# Patient Record
Sex: Male | Born: 1955
Health system: Southern US, Community
[De-identification: ages and names within clinical notes are randomized; demographics above are authoritative.]

## PROBLEM LIST (undated history)

## (undated) DIAGNOSIS — I639 Cerebral infarction, unspecified: Secondary | ICD-10-CM

## (undated) DIAGNOSIS — B1920 Unspecified viral hepatitis C without hepatic coma: Secondary | ICD-10-CM

## (undated) DIAGNOSIS — K219 Gastro-esophageal reflux disease without esophagitis: Secondary | ICD-10-CM

## (undated) DIAGNOSIS — R519 Headache, unspecified: Secondary | ICD-10-CM

## (undated) DIAGNOSIS — C3490 Malignant neoplasm of unspecified part of unspecified bronchus or lung: Secondary | ICD-10-CM

## (undated) DIAGNOSIS — G43909 Migraine, unspecified, not intractable, without status migrainosus: Secondary | ICD-10-CM

## (undated) DIAGNOSIS — R51 Headache: Secondary | ICD-10-CM

## (undated) DIAGNOSIS — I739 Peripheral vascular disease, unspecified: Secondary | ICD-10-CM

## (undated) DIAGNOSIS — Z9289 Personal history of other medical treatment: Secondary | ICD-10-CM

## (undated) DIAGNOSIS — N200 Calculus of kidney: Secondary | ICD-10-CM

## (undated) DIAGNOSIS — Z85118 Personal history of other malignant neoplasm of bronchus and lung: Secondary | ICD-10-CM

## (undated) DIAGNOSIS — F321 Major depressive disorder, single episode, moderate: Secondary | ICD-10-CM

## (undated) DIAGNOSIS — M545 Low back pain: Secondary | ICD-10-CM

## (undated) DIAGNOSIS — Z86010 Personal history of colonic polyps: Secondary | ICD-10-CM

## (undated) DIAGNOSIS — I2510 Atherosclerotic heart disease of native coronary artery without angina pectoris: Secondary | ICD-10-CM

## (undated) DIAGNOSIS — G4700 Insomnia, unspecified: Secondary | ICD-10-CM

## (undated) DIAGNOSIS — R32 Unspecified urinary incontinence: Secondary | ICD-10-CM

## (undated) DIAGNOSIS — J449 Chronic obstructive pulmonary disease, unspecified: Secondary | ICD-10-CM

## (undated) DIAGNOSIS — J441 Chronic obstructive pulmonary disease with (acute) exacerbation: Secondary | ICD-10-CM

## (undated) DIAGNOSIS — M542 Cervicalgia: Secondary | ICD-10-CM

## (undated) DIAGNOSIS — M79661 Pain in right lower leg: Secondary | ICD-10-CM

## (undated) DIAGNOSIS — I10 Essential (primary) hypertension: Secondary | ICD-10-CM

## (undated) HISTORY — DX: Cerebral infarction, unspecified: I63.9

## (undated) HISTORY — DX: Unspecified viral hepatitis C without hepatic coma: B19.20

## (undated) HISTORY — DX: Peripheral vascular disease, unspecified: I73.9

## (undated) HISTORY — DX: Essential (primary) hypertension: I10

## (undated) HISTORY — DX: Personal history of other medical treatment: Z92.89

## (undated) HISTORY — DX: Atherosclerotic heart disease of native coronary artery without angina pectoris: I25.10

---

## 1978-05-29 HISTORY — PX: OTHER SURGICAL HISTORY: SHX169

## 1979-05-30 DIAGNOSIS — Z9289 Personal history of other medical treatment: Secondary | ICD-10-CM

## 1979-05-30 HISTORY — DX: Personal history of other medical treatment: Z92.89

## 1998-10-01 ENCOUNTER — Encounter: Payer: Self-pay | Admitting: Emergency Medicine

## 1998-10-01 ENCOUNTER — Emergency Department (HOSPITAL_COMMUNITY): Admission: EM | Admit: 1998-10-01 | Discharge: 1998-10-01 | Payer: Self-pay | Admitting: Emergency Medicine

## 2000-01-05 ENCOUNTER — Emergency Department (HOSPITAL_COMMUNITY): Admission: EM | Admit: 2000-01-05 | Discharge: 2000-01-05 | Payer: Self-pay | Admitting: Emergency Medicine

## 2000-03-29 ENCOUNTER — Encounter: Payer: Self-pay | Admitting: Emergency Medicine

## 2000-03-29 ENCOUNTER — Emergency Department (HOSPITAL_COMMUNITY): Admission: EM | Admit: 2000-03-29 | Discharge: 2000-03-29 | Payer: Self-pay | Admitting: Emergency Medicine

## 2000-03-30 ENCOUNTER — Observation Stay (HOSPITAL_COMMUNITY): Admission: RE | Admit: 2000-03-30 | Discharge: 2000-03-31 | Payer: Self-pay | Admitting: *Deleted

## 2001-03-05 ENCOUNTER — Encounter: Payer: Self-pay | Admitting: Emergency Medicine

## 2001-03-05 ENCOUNTER — Emergency Department (HOSPITAL_COMMUNITY): Admission: EM | Admit: 2001-03-05 | Discharge: 2001-03-05 | Payer: Self-pay | Admitting: Emergency Medicine

## 2001-03-07 ENCOUNTER — Emergency Department (HOSPITAL_COMMUNITY): Admission: EM | Admit: 2001-03-07 | Discharge: 2001-03-07 | Payer: Self-pay | Admitting: Emergency Medicine

## 2001-03-14 ENCOUNTER — Emergency Department (HOSPITAL_COMMUNITY): Admission: EM | Admit: 2001-03-14 | Discharge: 2001-03-14 | Payer: Self-pay | Admitting: Emergency Medicine

## 2001-04-05 ENCOUNTER — Encounter: Payer: Self-pay | Admitting: Emergency Medicine

## 2001-04-05 ENCOUNTER — Emergency Department (HOSPITAL_COMMUNITY): Admission: EM | Admit: 2001-04-05 | Discharge: 2001-04-06 | Payer: Self-pay | Admitting: Emergency Medicine

## 2004-07-04 ENCOUNTER — Ambulatory Visit: Payer: Self-pay | Admitting: Family Medicine

## 2004-07-26 ENCOUNTER — Encounter: Admission: RE | Admit: 2004-07-26 | Discharge: 2004-07-26 | Payer: Self-pay | Admitting: Surgery

## 2004-07-28 ENCOUNTER — Ambulatory Visit (HOSPITAL_BASED_OUTPATIENT_CLINIC_OR_DEPARTMENT_OTHER): Admission: RE | Admit: 2004-07-28 | Discharge: 2004-07-28 | Payer: Self-pay | Admitting: Surgery

## 2004-07-28 ENCOUNTER — Ambulatory Visit (HOSPITAL_COMMUNITY): Admission: RE | Admit: 2004-07-28 | Discharge: 2004-07-28 | Payer: Self-pay | Admitting: Surgery

## 2004-07-29 ENCOUNTER — Ambulatory Visit: Payer: Self-pay | Admitting: Internal Medicine

## 2004-08-05 ENCOUNTER — Emergency Department (HOSPITAL_COMMUNITY): Admission: EM | Admit: 2004-08-05 | Discharge: 2004-08-06 | Payer: Self-pay | Admitting: Emergency Medicine

## 2004-08-11 ENCOUNTER — Ambulatory Visit: Payer: Self-pay | Admitting: Internal Medicine

## 2004-08-15 ENCOUNTER — Encounter (INDEPENDENT_AMBULATORY_CARE_PROVIDER_SITE_OTHER): Payer: Self-pay | Admitting: *Deleted

## 2004-08-15 ENCOUNTER — Ambulatory Visit: Admission: RE | Admit: 2004-08-15 | Discharge: 2004-08-15 | Payer: Self-pay | Admitting: Internal Medicine

## 2004-08-15 ENCOUNTER — Ambulatory Visit: Payer: Self-pay | Admitting: Internal Medicine

## 2004-08-23 ENCOUNTER — Ambulatory Visit: Payer: Self-pay | Admitting: Internal Medicine

## 2004-08-25 ENCOUNTER — Ambulatory Visit: Payer: Self-pay | Admitting: *Deleted

## 2004-08-26 ENCOUNTER — Encounter: Admission: RE | Admit: 2004-08-26 | Discharge: 2004-08-26 | Payer: Self-pay | Admitting: Internal Medicine

## 2004-08-27 ENCOUNTER — Ambulatory Visit (HOSPITAL_COMMUNITY): Admission: RE | Admit: 2004-08-27 | Discharge: 2004-08-27 | Payer: Self-pay | Admitting: Internal Medicine

## 2004-09-05 ENCOUNTER — Ambulatory Visit: Admission: RE | Admit: 2004-09-05 | Discharge: 2004-11-28 | Payer: Self-pay | Admitting: *Deleted

## 2004-09-06 ENCOUNTER — Ambulatory Visit: Payer: Self-pay | Admitting: Internal Medicine

## 2004-09-16 ENCOUNTER — Ambulatory Visit: Payer: Self-pay | Admitting: Family Medicine

## 2004-10-06 ENCOUNTER — Ambulatory Visit (HOSPITAL_COMMUNITY): Admission: RE | Admit: 2004-10-06 | Discharge: 2004-10-06 | Payer: Self-pay | Admitting: Internal Medicine

## 2004-10-10 ENCOUNTER — Ambulatory Visit: Payer: Self-pay | Admitting: Internal Medicine

## 2004-11-18 ENCOUNTER — Ambulatory Visit: Payer: Self-pay | Admitting: Family Medicine

## 2004-11-23 ENCOUNTER — Ambulatory Visit (HOSPITAL_COMMUNITY): Admission: RE | Admit: 2004-11-23 | Discharge: 2004-11-23 | Payer: Self-pay | Admitting: Internal Medicine

## 2004-11-25 ENCOUNTER — Ambulatory Visit: Payer: Self-pay | Admitting: Internal Medicine

## 2004-12-20 ENCOUNTER — Ambulatory Visit: Admission: RE | Admit: 2004-12-20 | Discharge: 2004-12-20 | Payer: Self-pay | Admitting: *Deleted

## 2005-01-10 ENCOUNTER — Ambulatory Visit: Payer: Self-pay | Admitting: Family Medicine

## 2005-01-13 ENCOUNTER — Ambulatory Visit: Payer: Self-pay | Admitting: Internal Medicine

## 2005-01-18 ENCOUNTER — Ambulatory Visit (HOSPITAL_COMMUNITY): Admission: RE | Admit: 2005-01-18 | Discharge: 2005-01-18 | Payer: Self-pay | Admitting: Internal Medicine

## 2005-01-24 ENCOUNTER — Ambulatory Visit: Admission: RE | Admit: 2005-01-24 | Discharge: 2005-03-03 | Payer: Self-pay | Admitting: Internal Medicine

## 2005-01-26 ENCOUNTER — Ambulatory Visit: Payer: Self-pay | Admitting: Family Medicine

## 2005-01-27 ENCOUNTER — Ambulatory Visit: Payer: Self-pay | Admitting: Family Medicine

## 2005-01-30 ENCOUNTER — Emergency Department (HOSPITAL_COMMUNITY): Admission: EM | Admit: 2005-01-30 | Discharge: 2005-01-30 | Payer: Self-pay | Admitting: Emergency Medicine

## 2005-02-16 ENCOUNTER — Ambulatory Visit (HOSPITAL_COMMUNITY): Admission: RE | Admit: 2005-02-16 | Discharge: 2005-02-16 | Payer: Self-pay | Admitting: *Deleted

## 2005-02-28 ENCOUNTER — Ambulatory Visit: Payer: Self-pay | Admitting: Sports Medicine

## 2005-03-14 ENCOUNTER — Ambulatory Visit: Payer: Self-pay | Admitting: Internal Medicine

## 2005-03-14 ENCOUNTER — Ambulatory Visit (HOSPITAL_COMMUNITY): Admission: RE | Admit: 2005-03-14 | Discharge: 2005-03-14 | Payer: Self-pay | Admitting: Internal Medicine

## 2005-03-21 ENCOUNTER — Ambulatory Visit (HOSPITAL_COMMUNITY): Admission: RE | Admit: 2005-03-21 | Discharge: 2005-03-21 | Payer: Self-pay | Admitting: Internal Medicine

## 2005-04-17 ENCOUNTER — Ambulatory Visit: Payer: Self-pay | Admitting: Family Medicine

## 2005-04-19 ENCOUNTER — Ambulatory Visit (HOSPITAL_COMMUNITY): Admission: RE | Admit: 2005-04-19 | Discharge: 2005-04-19 | Payer: Self-pay | Admitting: Internal Medicine

## 2005-05-10 ENCOUNTER — Emergency Department (HOSPITAL_COMMUNITY): Admission: EM | Admit: 2005-05-10 | Discharge: 2005-05-10 | Payer: Self-pay | Admitting: Emergency Medicine

## 2005-05-15 ENCOUNTER — Emergency Department (HOSPITAL_COMMUNITY): Admission: EM | Admit: 2005-05-15 | Discharge: 2005-05-15 | Payer: Self-pay | Admitting: Emergency Medicine

## 2005-05-23 ENCOUNTER — Emergency Department (HOSPITAL_COMMUNITY): Admission: EM | Admit: 2005-05-23 | Discharge: 2005-05-23 | Payer: Self-pay | Admitting: Emergency Medicine

## 2005-05-24 ENCOUNTER — Ambulatory Visit: Payer: Self-pay | Admitting: Internal Medicine

## 2005-05-25 ENCOUNTER — Ambulatory Visit (HOSPITAL_COMMUNITY): Admission: RE | Admit: 2005-05-25 | Discharge: 2005-05-25 | Payer: Self-pay | Admitting: Internal Medicine

## 2005-07-14 ENCOUNTER — Ambulatory Visit: Payer: Self-pay | Admitting: Internal Medicine

## 2005-07-18 ENCOUNTER — Ambulatory Visit (HOSPITAL_COMMUNITY): Admission: RE | Admit: 2005-07-18 | Discharge: 2005-07-18 | Payer: Self-pay | Admitting: Internal Medicine

## 2005-07-25 ENCOUNTER — Ambulatory Visit (HOSPITAL_COMMUNITY): Admission: RE | Admit: 2005-07-25 | Discharge: 2005-07-25 | Payer: Self-pay | Admitting: Internal Medicine

## 2005-07-28 ENCOUNTER — Ambulatory Visit: Payer: Self-pay | Admitting: Family Medicine

## 2005-08-28 ENCOUNTER — Ambulatory Visit: Payer: Self-pay | Admitting: Family Medicine

## 2005-08-29 ENCOUNTER — Ambulatory Visit (HOSPITAL_COMMUNITY): Admission: RE | Admit: 2005-08-29 | Discharge: 2005-08-29 | Payer: Self-pay | Admitting: Family Medicine

## 2005-09-18 ENCOUNTER — Ambulatory Visit: Payer: Self-pay | Admitting: Family Medicine

## 2005-10-10 ENCOUNTER — Ambulatory Visit (HOSPITAL_COMMUNITY): Admission: RE | Admit: 2005-10-10 | Discharge: 2005-10-10 | Payer: Self-pay | Admitting: Internal Medicine

## 2005-10-13 ENCOUNTER — Ambulatory Visit: Payer: Self-pay | Admitting: Internal Medicine

## 2005-10-17 LAB — CBC WITH DIFFERENTIAL/PLATELET
BASO%: 0.6 % (ref 0.0–2.0)
Basophils Absolute: 0 10*3/uL (ref 0.0–0.1)
HCT: 41.2 % (ref 38.7–49.9)
HGB: 14.3 g/dL (ref 13.0–17.1)
MCHC: 34.6 g/dL (ref 32.0–35.9)
MONO#: 0.4 10*3/uL (ref 0.1–0.9)
NEUT%: 50.8 % (ref 40.0–75.0)
WBC: 3.9 10*3/uL — ABNORMAL LOW (ref 4.0–10.0)
lymph#: 1.4 10*3/uL (ref 0.9–3.3)

## 2005-10-17 LAB — COMPREHENSIVE METABOLIC PANEL
ALT: 32 U/L (ref 0–40)
Albumin: 4.2 g/dL (ref 3.5–5.2)
CO2: 27 mEq/L (ref 19–32)
Calcium: 9.4 mg/dL (ref 8.4–10.5)
Chloride: 103 mEq/L (ref 96–112)
Creatinine, Ser: 1 mg/dL (ref 0.4–1.5)

## 2005-10-26 ENCOUNTER — Encounter (INDEPENDENT_AMBULATORY_CARE_PROVIDER_SITE_OTHER): Payer: Self-pay | Admitting: Specialist

## 2005-10-26 ENCOUNTER — Ambulatory Visit (HOSPITAL_COMMUNITY): Admission: RE | Admit: 2005-10-26 | Discharge: 2005-10-26 | Payer: Self-pay | Admitting: Urology

## 2005-11-20 ENCOUNTER — Ambulatory Visit: Payer: Self-pay | Admitting: Sports Medicine

## 2005-12-25 ENCOUNTER — Ambulatory Visit: Payer: Self-pay | Admitting: Family Medicine

## 2006-01-31 ENCOUNTER — Ambulatory Visit: Payer: Self-pay | Admitting: Family Medicine

## 2006-02-02 ENCOUNTER — Ambulatory Visit: Payer: Self-pay | Admitting: Internal Medicine

## 2006-02-05 ENCOUNTER — Ambulatory Visit: Payer: Self-pay | Admitting: Sports Medicine

## 2006-02-06 ENCOUNTER — Ambulatory Visit (HOSPITAL_COMMUNITY): Admission: RE | Admit: 2006-02-06 | Discharge: 2006-02-06 | Payer: Self-pay | Admitting: Internal Medicine

## 2006-02-13 LAB — CBC WITH DIFFERENTIAL/PLATELET
Basophils Absolute: 0 10*3/uL (ref 0.0–0.1)
HCT: 41.8 % (ref 38.7–49.9)
HGB: 14.6 g/dL (ref 13.0–17.1)
MCH: 33.9 pg — ABNORMAL HIGH (ref 28.0–33.4)
MONO#: 0.4 10*3/uL (ref 0.1–0.9)
NEUT%: 52.8 % (ref 40.0–75.0)
WBC: 4.3 10*3/uL (ref 4.0–10.0)
lymph#: 1.5 10*3/uL (ref 0.9–3.3)

## 2006-02-13 LAB — COMPREHENSIVE METABOLIC PANEL
BUN: 13 mg/dL (ref 6–23)
CO2: 26 mEq/L (ref 19–32)
Calcium: 9.8 mg/dL (ref 8.4–10.5)
Chloride: 101 mEq/L (ref 96–112)
Creatinine, Ser: 0.88 mg/dL (ref 0.40–1.50)
Glucose, Bld: 106 mg/dL — ABNORMAL HIGH (ref 70–99)

## 2006-03-12 ENCOUNTER — Ambulatory Visit: Payer: Self-pay | Admitting: Family Medicine

## 2006-03-22 ENCOUNTER — Emergency Department (HOSPITAL_COMMUNITY): Admission: EM | Admit: 2006-03-22 | Discharge: 2006-03-22 | Payer: Self-pay | Admitting: Emergency Medicine

## 2006-03-26 ENCOUNTER — Ambulatory Visit: Payer: Self-pay | Admitting: Family Medicine

## 2006-05-07 ENCOUNTER — Ambulatory Visit: Payer: Self-pay | Admitting: Family Medicine

## 2006-05-29 HISTORY — PX: HIATAL HERNIA REPAIR: SHX195

## 2006-06-05 ENCOUNTER — Ambulatory Visit (HOSPITAL_COMMUNITY): Admission: RE | Admit: 2006-06-05 | Discharge: 2006-06-05 | Payer: Self-pay | Admitting: Internal Medicine

## 2006-06-07 ENCOUNTER — Ambulatory Visit: Payer: Self-pay | Admitting: Internal Medicine

## 2006-06-08 ENCOUNTER — Ambulatory Visit: Payer: Self-pay | Admitting: Family Medicine

## 2006-06-12 LAB — CBC WITH DIFFERENTIAL/PLATELET
BASO%: 0.3 % (ref 0.0–2.0)
HCT: 41.9 % (ref 38.7–49.9)
MCHC: 34.8 g/dL (ref 32.0–35.9)
MONO#: 0.4 10*3/uL (ref 0.1–0.9)
NEUT%: 54.7 % (ref 40.0–75.0)
RBC: 4.26 10*6/uL (ref 4.20–5.71)
RDW: 12.2 % (ref 11.2–14.6)
WBC: 4.2 10*3/uL (ref 4.0–10.0)
lymph#: 1.4 10*3/uL (ref 0.9–3.3)

## 2006-06-12 LAB — COMPREHENSIVE METABOLIC PANEL
AST: 30 U/L (ref 0–37)
Albumin: 4.4 g/dL (ref 3.5–5.2)
BUN: 14 mg/dL (ref 6–23)
Calcium: 9.6 mg/dL (ref 8.4–10.5)
Chloride: 102 mEq/L (ref 96–112)
Glucose, Bld: 120 mg/dL — ABNORMAL HIGH (ref 70–99)
Potassium: 4.3 mEq/L (ref 3.5–5.3)

## 2006-07-09 ENCOUNTER — Ambulatory Visit (HOSPITAL_BASED_OUTPATIENT_CLINIC_OR_DEPARTMENT_OTHER): Admission: RE | Admit: 2006-07-09 | Discharge: 2006-07-09 | Payer: Self-pay | Admitting: Urology

## 2006-07-09 ENCOUNTER — Encounter (INDEPENDENT_AMBULATORY_CARE_PROVIDER_SITE_OTHER): Payer: Self-pay | Admitting: *Deleted

## 2006-07-26 DIAGNOSIS — R519 Headache, unspecified: Secondary | ICD-10-CM | POA: Insufficient documentation

## 2006-07-26 DIAGNOSIS — F5232 Male orgasmic disorder: Secondary | ICD-10-CM

## 2006-07-26 DIAGNOSIS — G4700 Insomnia, unspecified: Secondary | ICD-10-CM | POA: Insufficient documentation

## 2006-07-26 DIAGNOSIS — R51 Headache: Secondary | ICD-10-CM

## 2006-07-26 DIAGNOSIS — F411 Generalized anxiety disorder: Secondary | ICD-10-CM | POA: Insufficient documentation

## 2006-07-26 DIAGNOSIS — C3490 Malignant neoplasm of unspecified part of unspecified bronchus or lung: Secondary | ICD-10-CM

## 2006-07-26 HISTORY — DX: Insomnia, unspecified: G47.00

## 2006-08-06 ENCOUNTER — Telehealth: Payer: Self-pay | Admitting: Family Medicine

## 2006-08-13 ENCOUNTER — Ambulatory Visit: Payer: Self-pay | Admitting: Family Medicine

## 2006-08-13 DIAGNOSIS — J449 Chronic obstructive pulmonary disease, unspecified: Secondary | ICD-10-CM

## 2006-08-13 HISTORY — DX: Chronic obstructive pulmonary disease, unspecified: J44.9

## 2006-08-15 ENCOUNTER — Telehealth: Payer: Self-pay | Admitting: Family Medicine

## 2006-08-16 ENCOUNTER — Emergency Department (HOSPITAL_COMMUNITY): Admission: EM | Admit: 2006-08-16 | Discharge: 2006-08-17 | Payer: Self-pay | Admitting: Emergency Medicine

## 2006-09-14 ENCOUNTER — Ambulatory Visit: Payer: Self-pay | Admitting: Family Medicine

## 2006-09-14 ENCOUNTER — Encounter: Admission: RE | Admit: 2006-09-14 | Discharge: 2006-09-14 | Payer: Self-pay | Admitting: Sports Medicine

## 2006-09-19 ENCOUNTER — Telehealth: Payer: Self-pay | Admitting: Family Medicine

## 2006-09-25 ENCOUNTER — Telehealth: Payer: Self-pay | Admitting: Family Medicine

## 2006-10-17 ENCOUNTER — Telehealth: Payer: Self-pay | Admitting: Family Medicine

## 2006-10-26 ENCOUNTER — Telehealth: Payer: Self-pay | Admitting: *Deleted

## 2006-10-27 ENCOUNTER — Emergency Department (HOSPITAL_COMMUNITY): Admission: EM | Admit: 2006-10-27 | Discharge: 2006-10-27 | Payer: Self-pay | Admitting: Emergency Medicine

## 2006-10-29 ENCOUNTER — Ambulatory Visit: Payer: Self-pay | Admitting: Sports Medicine

## 2006-11-06 ENCOUNTER — Telehealth: Payer: Self-pay | Admitting: *Deleted

## 2006-11-07 ENCOUNTER — Ambulatory Visit: Payer: Self-pay | Admitting: Sports Medicine

## 2006-11-26 ENCOUNTER — Telehealth (INDEPENDENT_AMBULATORY_CARE_PROVIDER_SITE_OTHER): Payer: Self-pay | Admitting: *Deleted

## 2006-12-03 ENCOUNTER — Ambulatory Visit: Payer: Self-pay | Admitting: Internal Medicine

## 2006-12-04 LAB — CBC WITH DIFFERENTIAL/PLATELET
BASO%: 0.3 % (ref 0.0–2.0)
Eosinophils Absolute: 0 10*3/uL (ref 0.0–0.5)
HGB: 14.4 g/dL (ref 13.0–17.1)
LYMPH%: 32.1 % (ref 14.0–48.0)
MCH: 34.7 pg — ABNORMAL HIGH (ref 28.0–33.4)
MCHC: 35.5 g/dL (ref 32.0–35.9)
MONO#: 0.5 10*3/uL (ref 0.1–0.9)
MONO%: 11 % (ref 0.0–13.0)
NEUT#: 2.6 10*3/uL (ref 1.5–6.5)
NEUT%: 55.9 % (ref 40.0–75.0)

## 2006-12-04 LAB — COMPREHENSIVE METABOLIC PANEL
Alkaline Phosphatase: 80 U/L (ref 39–117)
CO2: 28 mEq/L (ref 19–32)
Creatinine, Ser: 0.99 mg/dL (ref 0.40–1.50)
Glucose, Bld: 87 mg/dL (ref 70–99)
Total Bilirubin: 0.5 mg/dL (ref 0.3–1.2)

## 2006-12-06 ENCOUNTER — Ambulatory Visit (HOSPITAL_COMMUNITY): Admission: RE | Admit: 2006-12-06 | Discharge: 2006-12-06 | Payer: Self-pay | Admitting: Internal Medicine

## 2006-12-24 ENCOUNTER — Ambulatory Visit: Payer: Self-pay | Admitting: Family Medicine

## 2006-12-24 DIAGNOSIS — F3289 Other specified depressive episodes: Secondary | ICD-10-CM | POA: Insufficient documentation

## 2006-12-24 DIAGNOSIS — F329 Major depressive disorder, single episode, unspecified: Secondary | ICD-10-CM | POA: Insufficient documentation

## 2007-01-29 ENCOUNTER — Telehealth: Payer: Self-pay | Admitting: *Deleted

## 2007-02-11 ENCOUNTER — Telehealth: Payer: Self-pay | Admitting: *Deleted

## 2007-02-15 ENCOUNTER — Ambulatory Visit: Payer: Self-pay | Admitting: Family Medicine

## 2007-02-18 ENCOUNTER — Telehealth (INDEPENDENT_AMBULATORY_CARE_PROVIDER_SITE_OTHER): Payer: Self-pay | Admitting: *Deleted

## 2007-02-18 ENCOUNTER — Telehealth: Payer: Self-pay | Admitting: Family Medicine

## 2007-02-18 LAB — CONVERTED CEMR LAB
ALT: 106 units/L — ABNORMAL HIGH (ref 0–53)
AST: 59 units/L — ABNORMAL HIGH (ref 0–37)
Albumin: 3.9 g/dL (ref 3.5–5.2)
Alkaline Phosphatase: 95 units/L (ref 39–117)
Glucose, Bld: 74 mg/dL (ref 70–99)
MCHC: 34.3 g/dL (ref 30.0–36.0)
MCV: 96.2 fL (ref 78.0–100.0)
Platelets: 193 10*3/uL (ref 150–400)
Potassium: 4.4 meq/L (ref 3.5–5.3)
RDW: 12.3 % (ref 11.5–14.0)
Sodium: 140 meq/L (ref 135–145)
Total Bilirubin: 0.5 mg/dL (ref 0.3–1.2)
Total Protein: 7.1 g/dL (ref 6.0–8.3)

## 2007-02-19 ENCOUNTER — Telehealth: Payer: Self-pay | Admitting: Family Medicine

## 2007-02-19 ENCOUNTER — Ambulatory Visit (HOSPITAL_COMMUNITY): Admission: RE | Admit: 2007-02-19 | Discharge: 2007-02-19 | Payer: Self-pay | Admitting: Family Medicine

## 2007-02-25 ENCOUNTER — Encounter: Payer: Self-pay | Admitting: *Deleted

## 2007-03-08 ENCOUNTER — Telehealth: Payer: Self-pay | Admitting: Family Medicine

## 2007-03-11 ENCOUNTER — Ambulatory Visit: Payer: Self-pay | Admitting: Family Medicine

## 2007-03-25 ENCOUNTER — Ambulatory Visit: Payer: Self-pay | Admitting: Family Medicine

## 2007-03-26 ENCOUNTER — Encounter (INDEPENDENT_AMBULATORY_CARE_PROVIDER_SITE_OTHER): Payer: Self-pay | Admitting: *Deleted

## 2007-03-26 LAB — CONVERTED CEMR LAB
ALT: 102 units/L — ABNORMAL HIGH (ref 0–53)
Alkaline Phosphatase: 72 units/L (ref 39–117)
CO2: 26 meq/L (ref 19–32)
Creatinine, Ser: 0.83 mg/dL (ref 0.40–1.50)
HCT: 42.1 % (ref 39.0–52.0)
MCHC: 34.7 g/dL (ref 30.0–36.0)
MCV: 94.2 fL (ref 78.0–100.0)
Platelets: 233 10*3/uL (ref 150–400)
Total Bilirubin: 0.7 mg/dL (ref 0.3–1.2)
WBC: 5.2 10*3/uL (ref 4.0–10.5)

## 2007-03-29 ENCOUNTER — Encounter: Admission: RE | Admit: 2007-03-29 | Discharge: 2007-03-29 | Payer: Self-pay | Admitting: Family Medicine

## 2007-04-08 ENCOUNTER — Ambulatory Visit: Payer: Self-pay | Admitting: Family Medicine

## 2007-04-08 DIAGNOSIS — N200 Calculus of kidney: Secondary | ICD-10-CM

## 2007-04-08 DIAGNOSIS — K8020 Calculus of gallbladder without cholecystitis without obstruction: Secondary | ICD-10-CM | POA: Insufficient documentation

## 2007-04-08 HISTORY — DX: Calculus of kidney: N20.0

## 2007-04-12 ENCOUNTER — Telehealth: Payer: Self-pay | Admitting: Family Medicine

## 2007-05-03 ENCOUNTER — Telehealth: Payer: Self-pay | Admitting: Family Medicine

## 2007-05-06 ENCOUNTER — Encounter: Admission: RE | Admit: 2007-05-06 | Discharge: 2007-05-06 | Payer: Self-pay | Admitting: Surgery

## 2007-05-06 ENCOUNTER — Telehealth (INDEPENDENT_AMBULATORY_CARE_PROVIDER_SITE_OTHER): Payer: Self-pay | Admitting: Family Medicine

## 2007-06-03 ENCOUNTER — Ambulatory Visit: Payer: Self-pay | Admitting: Internal Medicine

## 2007-06-05 ENCOUNTER — Ambulatory Visit (HOSPITAL_COMMUNITY): Admission: RE | Admit: 2007-06-05 | Discharge: 2007-06-05 | Payer: Self-pay | Admitting: Internal Medicine

## 2007-06-05 LAB — CBC WITH DIFFERENTIAL/PLATELET
Basophils Absolute: 0 10*3/uL (ref 0.0–0.1)
Eosinophils Absolute: 0.2 10*3/uL (ref 0.0–0.5)
HCT: 40.6 % (ref 38.7–49.9)
LYMPH%: 39.9 % (ref 14.0–48.0)
MONO#: 0.4 10*3/uL (ref 0.1–0.9)
NEUT#: 1.8 10*3/uL (ref 1.5–6.5)
NEUT%: 45.5 % (ref 40.0–75.0)
Platelets: 201 10*3/uL (ref 145–400)
WBC: 3.9 10*3/uL — ABNORMAL LOW (ref 4.0–10.0)

## 2007-06-05 LAB — COMPREHENSIVE METABOLIC PANEL
BUN: 10 mg/dL (ref 6–23)
CO2: 27 mEq/L (ref 19–32)
Creatinine, Ser: 0.87 mg/dL (ref 0.40–1.50)
Glucose, Bld: 85 mg/dL (ref 70–99)
Total Bilirubin: 0.8 mg/dL (ref 0.3–1.2)
Total Protein: 7.1 g/dL (ref 6.0–8.3)

## 2007-06-12 ENCOUNTER — Encounter: Payer: Self-pay | Admitting: Family Medicine

## 2007-07-17 ENCOUNTER — Telehealth: Payer: Self-pay | Admitting: *Deleted

## 2007-07-24 ENCOUNTER — Ambulatory Visit: Payer: Self-pay | Admitting: Family Medicine

## 2007-07-24 ENCOUNTER — Telehealth: Payer: Self-pay | Admitting: *Deleted

## 2007-07-25 ENCOUNTER — Ambulatory Visit: Payer: Self-pay | Admitting: Internal Medicine

## 2007-08-12 ENCOUNTER — Telehealth: Payer: Self-pay | Admitting: Family Medicine

## 2007-08-26 ENCOUNTER — Telehealth: Payer: Self-pay | Admitting: Family Medicine

## 2007-08-30 ENCOUNTER — Ambulatory Visit: Payer: Self-pay | Admitting: Family Medicine

## 2007-09-25 ENCOUNTER — Telehealth: Payer: Self-pay | Admitting: Family Medicine

## 2007-09-25 ENCOUNTER — Encounter: Payer: Self-pay | Admitting: Family Medicine

## 2007-10-14 ENCOUNTER — Ambulatory Visit: Payer: Self-pay | Admitting: Family Medicine

## 2007-10-14 ENCOUNTER — Telehealth (INDEPENDENT_AMBULATORY_CARE_PROVIDER_SITE_OTHER): Payer: Self-pay | Admitting: *Deleted

## 2007-10-14 LAB — CONVERTED CEMR LAB
Albumin: 4.4 g/dL (ref 3.5–5.2)
BUN: 18 mg/dL (ref 6–23)
CO2: 26 meq/L (ref 19–32)
Glucose, Bld: 92 mg/dL (ref 70–99)
Potassium: 5 meq/L (ref 3.5–5.3)
Sodium: 138 meq/L (ref 135–145)
Total Bilirubin: 0.7 mg/dL (ref 0.3–1.2)
Total Protein: 7.9 g/dL (ref 6.0–8.3)

## 2007-10-16 ENCOUNTER — Encounter: Admission: RE | Admit: 2007-10-16 | Discharge: 2007-10-16 | Payer: Self-pay | Admitting: Family Medicine

## 2007-11-12 ENCOUNTER — Ambulatory Visit: Payer: Self-pay | Admitting: Internal Medicine

## 2007-11-14 ENCOUNTER — Ambulatory Visit (HOSPITAL_COMMUNITY): Admission: RE | Admit: 2007-11-14 | Discharge: 2007-11-14 | Payer: Self-pay | Admitting: Internal Medicine

## 2007-11-14 LAB — CBC WITH DIFFERENTIAL/PLATELET
BASO%: 0.2 % (ref 0.0–2.0)
Eosinophils Absolute: 0.1 10*3/uL (ref 0.0–0.5)
LYMPH%: 29.5 % (ref 14.0–48.0)
MCHC: 34.8 g/dL (ref 32.0–35.9)
MONO#: 0.5 10*3/uL (ref 0.1–0.9)
MONO%: 12.3 % (ref 0.0–13.0)
NEUT#: 2.5 10*3/uL (ref 1.5–6.5)
Platelets: 230 10*3/uL (ref 145–400)
RBC: 4.3 10*6/uL (ref 4.20–5.71)
RDW: 12.9 % (ref 11.2–14.6)
WBC: 4.5 10*3/uL (ref 4.0–10.0)

## 2007-11-14 LAB — COMPREHENSIVE METABOLIC PANEL
ALT: 62 U/L — ABNORMAL HIGH (ref 0–53)
Albumin: 3.8 g/dL (ref 3.5–5.2)
Alkaline Phosphatase: 61 U/L (ref 39–117)
CO2: 26 mEq/L (ref 19–32)
Potassium: 4.8 mEq/L (ref 3.5–5.3)
Sodium: 136 mEq/L (ref 135–145)
Total Bilirubin: 0.8 mg/dL (ref 0.3–1.2)
Total Protein: 7.1 g/dL (ref 6.0–8.3)

## 2007-11-15 ENCOUNTER — Ambulatory Visit: Payer: Self-pay | Admitting: Family Medicine

## 2007-11-15 ENCOUNTER — Telehealth (INDEPENDENT_AMBULATORY_CARE_PROVIDER_SITE_OTHER): Payer: Self-pay | Admitting: *Deleted

## 2007-11-15 DIAGNOSIS — M542 Cervicalgia: Secondary | ICD-10-CM

## 2007-11-15 HISTORY — DX: Cervicalgia: M54.2

## 2007-11-21 ENCOUNTER — Encounter: Payer: Self-pay | Admitting: Family Medicine

## 2007-11-26 ENCOUNTER — Ambulatory Visit (HOSPITAL_COMMUNITY): Admission: RE | Admit: 2007-11-26 | Discharge: 2007-11-26 | Payer: Self-pay | Admitting: Internal Medicine

## 2008-02-06 ENCOUNTER — Telehealth: Payer: Self-pay | Admitting: *Deleted

## 2008-02-07 ENCOUNTER — Ambulatory Visit: Payer: Self-pay | Admitting: Family Medicine

## 2008-02-07 DIAGNOSIS — K219 Gastro-esophageal reflux disease without esophagitis: Secondary | ICD-10-CM

## 2008-02-11 ENCOUNTER — Telehealth: Payer: Self-pay | Admitting: *Deleted

## 2008-02-13 ENCOUNTER — Ambulatory Visit: Payer: Self-pay | Admitting: Gastroenterology

## 2008-02-17 ENCOUNTER — Telehealth: Payer: Self-pay | Admitting: Family Medicine

## 2008-02-19 ENCOUNTER — Ambulatory Visit: Payer: Self-pay | Admitting: Gastroenterology

## 2008-02-19 ENCOUNTER — Encounter: Payer: Self-pay | Admitting: Gastroenterology

## 2008-02-23 ENCOUNTER — Encounter: Payer: Self-pay | Admitting: Gastroenterology

## 2008-02-24 ENCOUNTER — Encounter: Payer: Self-pay | Admitting: Family Medicine

## 2008-02-24 DIAGNOSIS — Z860100 Personal history of colon polyps, unspecified: Secondary | ICD-10-CM | POA: Insufficient documentation

## 2008-02-24 HISTORY — DX: Personal history of colon polyps, unspecified: Z86.0100

## 2008-02-24 HISTORY — DX: Personal history of colonic polyps: Z86.010

## 2008-03-25 ENCOUNTER — Encounter: Payer: Self-pay | Admitting: Family Medicine

## 2008-04-03 ENCOUNTER — Ambulatory Visit: Payer: Self-pay | Admitting: Family Medicine

## 2008-04-27 ENCOUNTER — Telehealth: Payer: Self-pay | Admitting: Family Medicine

## 2008-05-06 ENCOUNTER — Ambulatory Visit: Payer: Self-pay | Admitting: Family Medicine

## 2008-05-07 ENCOUNTER — Ambulatory Visit: Payer: Self-pay | Admitting: Internal Medicine

## 2008-05-15 ENCOUNTER — Ambulatory Visit (HOSPITAL_COMMUNITY): Admission: RE | Admit: 2008-05-15 | Discharge: 2008-05-15 | Payer: Self-pay | Admitting: Internal Medicine

## 2008-05-15 LAB — CBC WITH DIFFERENTIAL/PLATELET
BASO%: 0.4 % (ref 0.0–2.0)
Eosinophils Absolute: 0.2 10*3/uL (ref 0.0–0.5)
MCHC: 34.6 g/dL (ref 32.0–35.9)
MONO#: 0.7 10*3/uL (ref 0.1–0.9)
NEUT#: 2.5 10*3/uL (ref 1.5–6.5)
Platelets: 145 10*3/uL (ref 145–400)
RBC: 4.58 10*6/uL (ref 4.20–5.71)
RDW: 12.9 % (ref 11.2–14.6)
WBC: 5.2 10*3/uL (ref 4.0–10.0)
lymph#: 1.9 10*3/uL (ref 0.9–3.3)

## 2008-05-15 LAB — COMPREHENSIVE METABOLIC PANEL
ALT: 52 U/L (ref 0–53)
Albumin: 4.1 g/dL (ref 3.5–5.2)
CO2: 28 mEq/L (ref 19–32)
Chloride: 103 mEq/L (ref 96–112)
Glucose, Bld: 107 mg/dL — ABNORMAL HIGH (ref 70–99)
Potassium: 3.7 mEq/L (ref 3.5–5.3)
Sodium: 134 mEq/L — ABNORMAL LOW (ref 135–145)
Total Bilirubin: 0.8 mg/dL (ref 0.3–1.2)
Total Protein: 7.6 g/dL (ref 6.0–8.3)

## 2008-05-18 ENCOUNTER — Encounter: Payer: Self-pay | Admitting: Family Medicine

## 2008-05-27 ENCOUNTER — Telehealth: Payer: Self-pay | Admitting: *Deleted

## 2008-05-29 HISTORY — PX: TESTICLE REMOVAL: SHX68

## 2008-06-03 ENCOUNTER — Ambulatory Visit: Payer: Self-pay | Admitting: Gastroenterology

## 2008-06-17 ENCOUNTER — Encounter: Payer: Self-pay | Admitting: Gastroenterology

## 2008-06-17 ENCOUNTER — Ambulatory Visit: Payer: Self-pay | Admitting: Gastroenterology

## 2008-06-18 ENCOUNTER — Encounter: Payer: Self-pay | Admitting: Gastroenterology

## 2008-06-20 ENCOUNTER — Telehealth: Payer: Self-pay | Admitting: Gastroenterology

## 2008-06-23 ENCOUNTER — Encounter: Payer: Self-pay | Admitting: Family Medicine

## 2008-06-24 ENCOUNTER — Ambulatory Visit: Payer: Self-pay | Admitting: Family Medicine

## 2008-06-24 LAB — CONVERTED CEMR LAB
Albumin: 4.2 g/dL (ref 3.5–5.2)
CO2: 22 meq/L (ref 19–32)
Calcium: 9.5 mg/dL (ref 8.4–10.5)
Chloride: 106 meq/L (ref 96–112)
Glucose, Bld: 93 mg/dL (ref 70–99)
Potassium: 4.6 meq/L (ref 3.5–5.3)
RBC: 4.45 M/uL (ref 4.22–5.81)
Sodium: 142 meq/L (ref 135–145)
Total Protein: 7.2 g/dL (ref 6.0–8.3)
WBC: 4.1 10*3/uL (ref 4.0–10.5)

## 2008-06-25 ENCOUNTER — Telehealth: Payer: Self-pay | Admitting: Family Medicine

## 2008-06-26 ENCOUNTER — Ambulatory Visit: Payer: Self-pay | Admitting: Family Medicine

## 2008-06-26 ENCOUNTER — Encounter: Admission: RE | Admit: 2008-06-26 | Discharge: 2008-06-26 | Payer: Self-pay | Admitting: Family Medicine

## 2008-07-15 ENCOUNTER — Telehealth: Payer: Self-pay | Admitting: Family Medicine

## 2008-07-21 ENCOUNTER — Telehealth: Payer: Self-pay | Admitting: Family Medicine

## 2008-07-27 ENCOUNTER — Telehealth: Payer: Self-pay | Admitting: Family Medicine

## 2008-08-04 ENCOUNTER — Telehealth: Payer: Self-pay | Admitting: Family Medicine

## 2008-08-04 ENCOUNTER — Ambulatory Visit: Payer: Self-pay | Admitting: Family Medicine

## 2008-08-12 ENCOUNTER — Ambulatory Visit: Payer: Self-pay | Admitting: Family Medicine

## 2008-08-19 ENCOUNTER — Ambulatory Visit: Payer: Self-pay | Admitting: Family Medicine

## 2008-08-19 DIAGNOSIS — M719 Bursopathy, unspecified: Secondary | ICD-10-CM

## 2008-08-19 DIAGNOSIS — M67919 Unspecified disorder of synovium and tendon, unspecified shoulder: Secondary | ICD-10-CM | POA: Insufficient documentation

## 2008-08-19 LAB — CONVERTED CEMR LAB
Bilirubin Urine: NEGATIVE
Casts: >20 /lpf
Glucose, Urine, Semiquant: NEGATIVE
Specific Gravity, Urine: >=1.03
Urobilinogen, UA: 1
pH: 6

## 2008-08-20 LAB — CONVERTED CEMR LAB
HDL: 36 mg/dL — ABNORMAL LOW (ref >39)
PSA: 0.6 ng/mL (ref 0.10–4.00)
Total CHOL/HDL Ratio: 4.7

## 2008-09-04 ENCOUNTER — Ambulatory Visit: Payer: Self-pay | Admitting: Family Medicine

## 2008-09-15 ENCOUNTER — Encounter: Payer: Self-pay | Admitting: Family Medicine

## 2008-09-28 ENCOUNTER — Telehealth (INDEPENDENT_AMBULATORY_CARE_PROVIDER_SITE_OTHER): Payer: Self-pay | Admitting: *Deleted

## 2008-10-02 ENCOUNTER — Telehealth (INDEPENDENT_AMBULATORY_CARE_PROVIDER_SITE_OTHER): Payer: Self-pay | Admitting: *Deleted

## 2008-11-13 ENCOUNTER — Ambulatory Visit: Payer: Self-pay | Admitting: Internal Medicine

## 2008-11-18 ENCOUNTER — Ambulatory Visit (HOSPITAL_COMMUNITY): Admission: RE | Admit: 2008-11-18 | Discharge: 2008-11-18 | Payer: Self-pay | Admitting: Internal Medicine

## 2008-11-18 ENCOUNTER — Encounter: Payer: Self-pay | Admitting: Family Medicine

## 2008-11-18 LAB — COMPREHENSIVE METABOLIC PANEL
ALT: 58 U/L — ABNORMAL HIGH (ref 0–53)
Albumin: 3.9 g/dL (ref 3.5–5.2)
CO2: 30 mEq/L (ref 19–32)
Calcium: 9.6 mg/dL (ref 8.4–10.5)
Chloride: 105 mEq/L (ref 96–112)
Glucose, Bld: 139 mg/dL — ABNORMAL HIGH (ref 70–99)
Sodium: 139 mEq/L (ref 135–145)
Total Bilirubin: 0.8 mg/dL (ref 0.3–1.2)
Total Protein: 7.1 g/dL (ref 6.0–8.3)

## 2008-11-18 LAB — CBC WITH DIFFERENTIAL/PLATELET
Basophils Absolute: 0 10*3/uL (ref 0.0–0.1)
Eosinophils Absolute: 0.1 10*3/uL (ref 0.0–0.5)
HGB: 14.1 g/dL (ref 13.0–17.1)
LYMPH%: 35.9 % (ref 14.0–49.0)
MCV: 93.9 fL (ref 79.3–98.0)
MONO#: 0.5 10*3/uL (ref 0.1–0.9)
MONO%: 13.1 % (ref 0.0–14.0)
NEUT#: 1.8 10*3/uL (ref 1.5–6.5)
Platelets: 162 10*3/uL (ref 140–400)
RDW: 13.5 % (ref 11.0–14.6)
WBC: 3.7 10*3/uL — ABNORMAL LOW (ref 4.0–10.3)

## 2008-11-23 ENCOUNTER — Encounter: Payer: Self-pay | Admitting: Family Medicine

## 2008-11-27 ENCOUNTER — Ambulatory Visit (HOSPITAL_COMMUNITY): Admission: RE | Admit: 2008-11-27 | Discharge: 2008-11-27 | Payer: Self-pay | Admitting: Family Medicine

## 2008-11-27 ENCOUNTER — Ambulatory Visit: Payer: Self-pay | Admitting: Family Medicine

## 2008-11-27 DIAGNOSIS — M545 Low back pain, unspecified: Secondary | ICD-10-CM

## 2008-11-27 HISTORY — DX: Low back pain, unspecified: M54.50

## 2008-12-01 ENCOUNTER — Encounter: Payer: Self-pay | Admitting: Family Medicine

## 2008-12-04 ENCOUNTER — Telehealth: Payer: Self-pay | Admitting: Family Medicine

## 2008-12-04 ENCOUNTER — Emergency Department (HOSPITAL_COMMUNITY): Admission: EM | Admit: 2008-12-04 | Discharge: 2008-12-04 | Payer: Self-pay | Admitting: Family Medicine

## 2008-12-07 ENCOUNTER — Ambulatory Visit: Payer: Self-pay | Admitting: Family Medicine

## 2008-12-23 ENCOUNTER — Ambulatory Visit: Payer: Self-pay | Admitting: Family Medicine

## 2008-12-23 DIAGNOSIS — L301 Dyshidrosis [pompholyx]: Secondary | ICD-10-CM

## 2009-02-03 ENCOUNTER — Ambulatory Visit: Payer: Self-pay | Admitting: Family Medicine

## 2009-03-04 ENCOUNTER — Ambulatory Visit: Payer: Self-pay | Admitting: Family Medicine

## 2009-03-04 ENCOUNTER — Telehealth: Payer: Self-pay | Admitting: Family Medicine

## 2009-03-05 ENCOUNTER — Telehealth: Payer: Self-pay | Admitting: Family Medicine

## 2009-03-08 ENCOUNTER — Telehealth: Payer: Self-pay | Admitting: Family Medicine

## 2009-03-31 ENCOUNTER — Emergency Department (HOSPITAL_COMMUNITY): Admission: EM | Admit: 2009-03-31 | Discharge: 2009-03-31 | Payer: Self-pay | Admitting: Emergency Medicine

## 2009-04-16 ENCOUNTER — Ambulatory Visit: Payer: Self-pay | Admitting: Family Medicine

## 2009-05-04 ENCOUNTER — Ambulatory Visit: Payer: Self-pay | Admitting: Family Medicine

## 2009-05-10 ENCOUNTER — Ambulatory Visit (HOSPITAL_COMMUNITY): Admission: RE | Admit: 2009-05-10 | Discharge: 2009-05-10 | Payer: Self-pay | Admitting: Family Medicine

## 2009-05-10 ENCOUNTER — Encounter: Payer: Self-pay | Admitting: Sports Medicine

## 2009-05-10 ENCOUNTER — Ambulatory Visit: Payer: Self-pay | Admitting: Family Medicine

## 2009-05-10 ENCOUNTER — Telehealth: Payer: Self-pay | Admitting: Family Medicine

## 2009-05-18 ENCOUNTER — Encounter: Payer: Self-pay | Admitting: Family Medicine

## 2009-05-18 ENCOUNTER — Ambulatory Visit: Payer: Self-pay | Admitting: Internal Medicine

## 2009-05-20 ENCOUNTER — Ambulatory Visit (HOSPITAL_COMMUNITY): Admission: RE | Admit: 2009-05-20 | Discharge: 2009-05-20 | Payer: Self-pay | Admitting: Internal Medicine

## 2009-05-20 LAB — CBC WITH DIFFERENTIAL/PLATELET
BASO%: 0.1 % (ref 0.0–2.0)
Eosinophils Absolute: 0.1 10*3/uL (ref 0.0–0.5)
MCHC: 34.3 g/dL (ref 32.0–36.0)
MONO#: 0.6 10*3/uL (ref 0.1–0.9)
NEUT#: 2.8 10*3/uL (ref 1.5–6.5)
RBC: 4.61 10*6/uL (ref 4.20–5.82)
WBC: 5.2 10*3/uL (ref 4.0–10.3)
lymph#: 1.7 10*3/uL (ref 0.9–3.3)

## 2009-05-20 LAB — COMPREHENSIVE METABOLIC PANEL
ALT: 76 U/L — ABNORMAL HIGH (ref 0–53)
Albumin: 3.9 g/dL (ref 3.5–5.2)
CO2: 27 mEq/L (ref 19–32)
Calcium: 9.6 mg/dL (ref 8.4–10.5)
Chloride: 104 mEq/L (ref 96–112)
Glucose, Bld: 138 mg/dL — ABNORMAL HIGH (ref 70–99)
Potassium: 3.8 mEq/L (ref 3.5–5.3)
Sodium: 136 mEq/L (ref 135–145)
Total Protein: 7.5 g/dL (ref 6.0–8.3)

## 2009-05-25 ENCOUNTER — Encounter: Payer: Self-pay | Admitting: Family Medicine

## 2009-07-23 ENCOUNTER — Ambulatory Visit (HOSPITAL_COMMUNITY): Admission: RE | Admit: 2009-07-23 | Discharge: 2009-07-23 | Payer: Self-pay | Admitting: Family Medicine

## 2009-07-23 ENCOUNTER — Ambulatory Visit: Payer: Self-pay | Admitting: Family Medicine

## 2009-08-17 ENCOUNTER — Encounter: Payer: Self-pay | Admitting: Family Medicine

## 2009-09-09 ENCOUNTER — Ambulatory Visit: Payer: Self-pay | Admitting: Family Medicine

## 2009-09-09 ENCOUNTER — Ambulatory Visit (HOSPITAL_COMMUNITY): Admission: RE | Admit: 2009-09-09 | Discharge: 2009-09-09 | Payer: Self-pay | Admitting: Family Medicine

## 2009-09-10 ENCOUNTER — Ambulatory Visit: Payer: Self-pay | Admitting: Family Medicine

## 2009-09-10 DIAGNOSIS — F17200 Nicotine dependence, unspecified, uncomplicated: Secondary | ICD-10-CM | POA: Insufficient documentation

## 2009-09-13 ENCOUNTER — Ambulatory Visit: Payer: Self-pay | Admitting: Family Medicine

## 2009-09-16 ENCOUNTER — Encounter: Payer: Self-pay | Admitting: Family Medicine

## 2009-10-01 ENCOUNTER — Telehealth (INDEPENDENT_AMBULATORY_CARE_PROVIDER_SITE_OTHER): Payer: Self-pay | Admitting: *Deleted

## 2009-11-08 ENCOUNTER — Ambulatory Visit: Payer: Self-pay | Admitting: Internal Medicine

## 2009-11-10 ENCOUNTER — Ambulatory Visit (HOSPITAL_COMMUNITY): Admission: RE | Admit: 2009-11-10 | Discharge: 2009-11-10 | Payer: Self-pay | Admitting: Internal Medicine

## 2009-11-10 LAB — CBC WITH DIFFERENTIAL/PLATELET
BASO%: 0.3 % (ref 0.0–2.0)
HCT: 42.1 % (ref 38.4–49.9)
HGB: 14.5 g/dL (ref 13.0–17.1)
MCHC: 34.4 g/dL (ref 32.0–36.0)
MONO#: 0.5 10*3/uL (ref 0.1–0.9)
NEUT%: 49.8 % (ref 39.0–75.0)
RDW: 12.9 % (ref 11.0–14.6)
WBC: 5.4 10*3/uL (ref 4.0–10.3)
lymph#: 2 10*3/uL (ref 0.9–3.3)

## 2009-11-10 LAB — COMPREHENSIVE METABOLIC PANEL
ALT: 67 U/L — ABNORMAL HIGH (ref 0–53)
AST: 47 U/L — ABNORMAL HIGH (ref 0–37)
Albumin: 3.8 g/dL (ref 3.5–5.2)
CO2: 32 mEq/L (ref 19–32)
Calcium: 9.6 mg/dL (ref 8.4–10.5)
Chloride: 101 mEq/L (ref 96–112)
Creatinine, Ser: 0.97 mg/dL (ref 0.40–1.50)
Potassium: 4.2 mEq/L (ref 3.5–5.3)
Total Protein: 7.5 g/dL (ref 6.0–8.3)

## 2009-11-12 ENCOUNTER — Ambulatory Visit: Payer: Self-pay | Admitting: Family Medicine

## 2009-11-17 ENCOUNTER — Encounter: Payer: Self-pay | Admitting: Family Medicine

## 2009-11-23 ENCOUNTER — Ambulatory Visit (HOSPITAL_COMMUNITY): Admission: RE | Admit: 2009-11-23 | Discharge: 2009-11-23 | Payer: Self-pay | Admitting: Internal Medicine

## 2009-11-25 ENCOUNTER — Encounter: Payer: Self-pay | Admitting: Family Medicine

## 2009-12-14 ENCOUNTER — Telehealth: Payer: Self-pay | Admitting: Family Medicine

## 2009-12-29 ENCOUNTER — Ambulatory Visit: Payer: Self-pay | Admitting: Family Medicine

## 2010-01-05 ENCOUNTER — Inpatient Hospital Stay (HOSPITAL_COMMUNITY): Admission: EM | Admit: 2010-01-05 | Discharge: 2010-01-06 | Payer: Self-pay | Admitting: Emergency Medicine

## 2010-01-05 ENCOUNTER — Ambulatory Visit: Payer: Self-pay | Admitting: Family Medicine

## 2010-01-05 ENCOUNTER — Encounter: Payer: Self-pay | Admitting: Family Medicine

## 2010-01-06 ENCOUNTER — Encounter (INDEPENDENT_AMBULATORY_CARE_PROVIDER_SITE_OTHER): Payer: Self-pay | Admitting: Internal Medicine

## 2010-01-11 ENCOUNTER — Ambulatory Visit: Payer: Self-pay | Admitting: Family Medicine

## 2010-01-11 DIAGNOSIS — G43909 Migraine, unspecified, not intractable, without status migrainosus: Secondary | ICD-10-CM

## 2010-01-11 HISTORY — DX: Migraine, unspecified, not intractable, without status migrainosus: G43.909

## 2010-01-16 ENCOUNTER — Telehealth: Payer: Self-pay | Admitting: Family Medicine

## 2010-01-16 ENCOUNTER — Emergency Department (HOSPITAL_COMMUNITY): Admission: EM | Admit: 2010-01-16 | Discharge: 2010-01-16 | Payer: Self-pay | Admitting: Emergency Medicine

## 2010-01-16 ENCOUNTER — Emergency Department (HOSPITAL_COMMUNITY): Admission: EM | Admit: 2010-01-16 | Discharge: 2010-01-17 | Payer: Self-pay | Admitting: Emergency Medicine

## 2010-01-21 ENCOUNTER — Ambulatory Visit: Payer: Self-pay | Admitting: Family Medicine

## 2010-02-03 ENCOUNTER — Ambulatory Visit: Payer: Self-pay | Admitting: Family Medicine

## 2010-02-17 ENCOUNTER — Ambulatory Visit: Payer: Self-pay | Admitting: Internal Medicine

## 2010-02-21 ENCOUNTER — Ambulatory Visit (HOSPITAL_COMMUNITY): Admission: RE | Admit: 2010-02-21 | Discharge: 2010-02-21 | Payer: Self-pay | Admitting: Internal Medicine

## 2010-02-21 LAB — CBC WITH DIFFERENTIAL/PLATELET
Basophils Absolute: 0 10*3/uL (ref 0.0–0.1)
HCT: 42.1 % (ref 38.4–49.9)
HGB: 14.4 g/dL (ref 13.0–17.1)
MONO#: 0.5 10*3/uL (ref 0.1–0.9)
NEUT#: 2.2 10*3/uL (ref 1.5–6.5)
NEUT%: 44.3 % (ref 39.0–75.0)
WBC: 4.9 10*3/uL (ref 4.0–10.3)
lymph#: 2 10*3/uL (ref 0.9–3.3)

## 2010-02-21 LAB — COMPREHENSIVE METABOLIC PANEL
ALT: 32 U/L (ref 0–53)
Albumin: 4.2 g/dL (ref 3.5–5.2)
BUN: 14 mg/dL (ref 6–23)
CO2: 27 mEq/L (ref 19–32)
Calcium: 9.5 mg/dL (ref 8.4–10.5)
Chloride: 100 mEq/L (ref 96–112)
Creatinine, Ser: 0.91 mg/dL (ref 0.40–1.50)

## 2010-02-24 ENCOUNTER — Encounter: Payer: Self-pay | Admitting: Family Medicine

## 2010-03-01 ENCOUNTER — Emergency Department (HOSPITAL_COMMUNITY): Admission: EM | Admit: 2010-03-01 | Discharge: 2010-03-02 | Payer: Self-pay | Admitting: Emergency Medicine

## 2010-03-01 ENCOUNTER — Emergency Department (HOSPITAL_COMMUNITY): Admission: EM | Admit: 2010-03-01 | Discharge: 2010-03-01 | Payer: Self-pay | Admitting: Family Medicine

## 2010-03-07 ENCOUNTER — Encounter: Payer: Self-pay | Admitting: Family Medicine

## 2010-03-16 ENCOUNTER — Ambulatory Visit: Payer: Self-pay | Admitting: Family Medicine

## 2010-03-30 ENCOUNTER — Telehealth: Payer: Self-pay | Admitting: Family Medicine

## 2010-04-04 ENCOUNTER — Telehealth: Payer: Self-pay | Admitting: Family Medicine

## 2010-05-11 ENCOUNTER — Ambulatory Visit: Payer: Self-pay | Admitting: Family Medicine

## 2010-05-18 ENCOUNTER — Telehealth: Payer: Self-pay | Admitting: *Deleted

## 2010-05-19 ENCOUNTER — Ambulatory Visit: Payer: Self-pay | Admitting: Family Medicine

## 2010-05-19 ENCOUNTER — Encounter: Payer: Self-pay | Admitting: Family Medicine

## 2010-05-19 DIAGNOSIS — R42 Dizziness and giddiness: Secondary | ICD-10-CM

## 2010-05-19 LAB — CONVERTED CEMR LAB
ALT: 42 units/L (ref 0–53)
AST: 31 units/L (ref 0–37)
Albumin: 4.1 g/dL (ref 3.5–5.2)
Alkaline Phosphatase: 110 units/L (ref 39–117)
BUN: 11 mg/dL (ref 6–23)
CO2: 27 meq/L (ref 19–32)
Calcium: 9.8 mg/dL (ref 8.4–10.5)
Chloride: 103 meq/L (ref 96–112)
Creatinine, Ser: 0.78 mg/dL (ref 0.40–1.50)
Glucose, Bld: 95 mg/dL (ref 70–99)
HCT: 42.6 % (ref 39.0–52.0)
Hemoglobin: 14.5 g/dL (ref 13.0–17.0)
Lithium Lvl: 0.45 meq/L — ABNORMAL LOW (ref 0.80–1.40)
MCHC: 34 g/dL (ref 30.0–36.0)
MCV: 95.1 fL (ref 78.0–100.0)
Platelets: 240 10*3/uL (ref 150–400)
Potassium: 4.2 meq/L (ref 3.5–5.3)
RBC: 4.48 M/uL (ref 4.22–5.81)
RDW: 14.2 % (ref 11.5–15.5)
Sodium: 137 meq/L (ref 135–145)
TSH: 1.625 microintl units/mL (ref 0.350–4.500)
Total Bilirubin: 0.5 mg/dL (ref 0.3–1.2)
Total Protein: 7 g/dL (ref 6.0–8.3)
WBC: 6.5 10*3/uL (ref 4.0–10.5)

## 2010-06-08 ENCOUNTER — Encounter: Payer: Self-pay | Admitting: Gastroenterology

## 2010-06-08 ENCOUNTER — Ambulatory Visit: Admission: RE | Admit: 2010-06-08 | Discharge: 2010-06-08 | Payer: Self-pay | Source: Home / Self Care

## 2010-06-15 ENCOUNTER — Telehealth (INDEPENDENT_AMBULATORY_CARE_PROVIDER_SITE_OTHER): Payer: Self-pay | Admitting: Family Medicine

## 2010-06-18 ENCOUNTER — Other Ambulatory Visit: Payer: Self-pay | Admitting: Internal Medicine

## 2010-06-18 DIAGNOSIS — C3490 Malignant neoplasm of unspecified part of unspecified bronchus or lung: Secondary | ICD-10-CM

## 2010-06-19 ENCOUNTER — Encounter: Payer: Self-pay | Admitting: *Deleted
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
## 2010-06-30 NOTE — Consult Note (Signed)
Summary: [facility]  [facility]   Imported By: Clydell Hakim 12/02/2009 15:41:22  _____________________________________________________________________  External Attachment:    Type:   Image     Comment:   External Document
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
## 2010-06-30 NOTE — Assessment & Plan Note (Signed)
Summary: insomnia-may have cancer again/[place]   Vital Signs:  Patient profile:   55 year old male Weight:      174.7 pounds Temp:     98.4 degrees F oral Pulse rate:   116 / minute Pulse rhythm:   regular BP sitting:   109 / 80  (left arm) Cuff size:   regular  Vitals Entered By: Loralee Pacas CMA (December 29, 2009 1:28 PM) CC: insomnia   Primary Care Provider:  Doralee Albino MD  CC:  insomnia.  History of Present Illness: Smoking again Increased stress at home Migraine headaches 2 x per week Still with neck pain.  Habits & Providers  Alcohol-Tobacco-Diet     Tobacco Status: current     Tobacco Counseling: to quit use of tobacco products     Cigarette Packs/Day: <0.25  Current Medications (verified): 1)  Ibuprofen 600 Mg Tabs (Ibuprofen) .... Take 1 Tablet By Mouth Every Six Hours 2)  Budeprion Sr 150 Mg Xr12h-Tab (Bupropion Hcl) .... One By Mouth Twice Daily 3)  Omeprazole 20 Mg Cpdr (Omeprazole) .... One By Mouth Two Times A Day 4)  Loratadine 10 Mg Tabs (Loratadine) .... One By Mouth Daily For Allergies 5)  Amitriptyline Hcl 75 Mg Tabs (Amitriptyline Hcl) .... One By Mouth At Bedtime (Both For Sleep and To Prevent Migraine Headaches.) 6)  Aspirin 81 Mg Tabs (Aspirin) .... One Tab By Mouth Daily 7)  Imitrex 50 Mg Tabs (Sumatriptan Succinate) .... One Tab By Mouth At The First Sign of Migraine Headache, May Take Another in 2h If No Improvement. 8)  Flexeril 10 Mg Tabs (Cyclobenzaprine Hcl) .... One By Mouth At Bedtime 9)  Gabapentin 600 Mg Tabs (Gabapentin) .... One By Mouth Three Times A Day 10)  Proventil Hfa 108 (90 Base) Mcg/act Aers (Albuterol Sulfate) .... 2 Puffs With A Spacer Every 6 Hours As Needed For Wheezing 11)  Pocket Spacer  Devi (Spacer/aero-Holding [place]) .... Use As Directed With Your Inhaler 12)  Ventolin Hfa 108 (90 Base) Mcg/act Aers (Albuterol Sulfate) .... 2 Puffs Q 6 Hours As Needed For Wheezing  Allergies (verified): 1)  Metoprolol  Succinate  Past History:  Past medical, surgical, family and social histories (including risk factors) reviewed, and no changes noted (except as noted below).  Past Medical History: Reviewed history from 05/10/2009 and no changes required. bilateral hydrocele 603.9 CT proven repeat hydrocoel surg 2/08 Hx SCCA lung, in remission since 2006, s/p cisplatin/etopiside/radiation.  Past Surgical History: Reviewed history from 07/24/2007 and no changes required. hydrocele repair - 10/27/2005, radiation Rx for lung ca - 01/27/2005  Family History: Reviewed history from 09/14/2006 and no changes required. Family History of Alcoholism/Addiction Family History Hypertension Family History Lung cancer  Social History: Reviewed history from 09/09/2009 and no changes required. previously incarcerated; recovering narcotic addict Smoked for years quit, then restarted. ( 09/09/09)  Physical Exam  General:  Well-developed,well-nourished,in no acute distress; alert,appropriate and cooperative throughout examination Lungs:  Normal respiratory effort, chest expands symmetrically. Lungs are clear to auscultation, no crackles or wheezes. Heart:  Normal rate and regular rhythm. S1 and S2 normal without gallop, murmur, click, rub or other extra sounds.   Impression & Recommendations:  Problem # 1:  ANXIETY (ICD-300.00) Assessment Deteriorated  His updated medication list for this problem includes:    Budeprion Sr 150 Mg Xr12h-tab (Bupropion hcl) ..... One by mouth twice daily    Amitriptyline Hcl 75 Mg Tabs (Amitriptyline hcl) ..... One by mouth at bedtime (both for sleep  and to prevent migraine headaches.)  Orders: [facility]- Est  Level 4 (54098)  Problem # 2:  NECK PAIN (ICD-723.1) Assessment: Unchanged increase gabapentin His updated medication list for this problem includes:    Ibuprofen 600 Mg Tabs (Ibuprofen) .Marland Kitchen... Take 1 tablet by mouth every six hours    Aspirin 81 Mg Tabs (Aspirin) ..... One  tab by mouth daily    Flexeril 10 Mg Tabs (Cyclobenzaprine hcl) ..... One by mouth at bedtime  Problem # 3:  TOBACCO USE DISORDER/SMOKER-SMOKING CESSATION DISCUSSED (ICD-305.1) Assessment: Deteriorated Started smoking again.    Problem # 4:  INSOMNIA NOS (ICD-780.52)  trial of amitryptaline  Orders: [facility]- Est  Level 4 (11914)  Problem # 5:  HEADACHE, UNSPECIFIED (ICD-784.0)  Trial of amitryptaline for prophlaxis His updated medication list for this problem includes:    Ibuprofen 600 Mg Tabs (Ibuprofen) .Marland Kitchen... Take 1 tablet by mouth every six hours    Aspirin 81 Mg Tabs (Aspirin) ..... One tab by mouth daily    Imitrex 50 Mg Tabs (Sumatriptan succinate) ..... One tab by mouth at the first sign of migraine headache, may take another in 2h if no improvement.  Orders: [facility]- Est  Level 4 (78295)  Complete Medication List: 1)  Ibuprofen 600 Mg Tabs (Ibuprofen) .... Take 1 tablet by mouth every six hours 2)  Budeprion Sr 150 Mg Xr12h-tab (Bupropion hcl) .... One by mouth twice daily 3)  Omeprazole 20 Mg Cpdr (Omeprazole) .... One by mouth two times a day 4)  Loratadine 10 Mg Tabs (Loratadine) .... One by mouth daily for allergies 5)  Amitriptyline Hcl 75 Mg Tabs (Amitriptyline hcl) .... One by mouth at bedtime (both for sleep and to prevent migraine headaches.) 6)  Aspirin 81 Mg Tabs (Aspirin) .... One tab by mouth daily 7)  Imitrex 50 Mg Tabs (Sumatriptan succinate) .... One tab by mouth at the first sign of migraine headache, may take another in 2h if no improvement. 8)  Flexeril 10 Mg Tabs (Cyclobenzaprine hcl) .... One by mouth at bedtime 9)  Gabapentin 600 Mg Tabs (Gabapentin) .... One by mouth three times a day 10)  Proventil Hfa 108 (90 Base) Mcg/act Aers (Albuterol sulfate) .... 2 puffs with a spacer every 6 hours as needed for wheezing 11)  Pocket Spacer Devi (Spacer/aero-holding chambers) .... Use as directed with your inhaler 12)  Ventolin Hfa 108 (90 Base) Mcg/act Aers  (Albuterol sulfate) .... 2 puffs q 6 hours as needed for wheezing  Other Orders: T-PSA Total ([address])  Patient Instructions: 1)   I have increased your dose of gabapentin for neck pain.  New prescription at the pharmacy. 2)  The second new prescription is for a medicine to help you sleep and to prevent migraines. 3)  Stop the zolpidem - your old sleeping pill.   4)  Tobacco is very bad for your health and your loved ones ! You should stop smoking !  5)  Stop smoking tips: Choose a quit date. Cut down before the quit date. Decide what you will do as a substitute when you feel the urge to smoke(gum, toothpick, exercise).  Prescriptions: AMITRIPTYLINE HCL 75 MG TABS (AMITRIPTYLINE HCL) one by mouth at bedtime (both for sleep and to prevent migraine headaches.)  #30 x 6   Entered and Authorized by:   Doralee Albino MD   Signed by:   Doralee Albino MD on 12/29/2009   Method used:   Electronically to        [facility]  [address] [phone]* (retail)       [address]       [place], [place]  [address]       Ph: [phone] or [phone]       Fax: [phone]   RxID:   [id number] GABAPENTIN 600 MG TABS (GABAPENTIN) one by mouth three times a day  #90 x 12   Entered and Authorized by:   Doralee Albino MD   Signed by:   Doralee Albino MD on 12/29/2009   Method used:   Electronically to        [facility] [address] [phone]* (retail)       [address]       [place], [place]  [address]       Ph: [phone] or [phone]       Fax: [phone]   RxID:   [id number]    Prevention & Chronic Care Immunizations   Influenza vaccine: Fluvax MCR  (04/16/2009)    Tetanus booster: 05/29/2000: Done.   Tetanus booster due: 05/29/2010    Pneumococcal vaccine: Done.  (08/27/2004)   Pneumococcal vaccine due: None  Colorectal Screening   Hemoccult: Not documented   Hemoccult due: Not Indicated    Colonoscopy: abnormal   (06/17/2008)   Colonoscopy due: 06/18/2011  Other Screening   PSA: 0.60  (08/19/2008)   PSA ordered.   PSA action/deferral: Discussed-PSA requested  (12/29/2009)   PSA due due: 08/19/2009   Smoking status: current  (12/29/2009)   Smoking cessation counseling: YES  (12/29/2009)  Lipids   Total Cholesterol: 169  (08/19/2008)   LDL: 72  (08/19/2008)   LDL Direct: Not documented   HDL: 36  (08/19/2008)   Triglycerides: 306  (08/19/2008)

## 2010-06-30 NOTE — Progress Notes (Signed)
Summary: meds prob   Phone Note Call from Patient Call back at Home Phone [phone]   Caller: Patient Summary of Call: states that his balance has been off since he has been taking his Lithium Initial call taken by: De Nurse,  May 18, 2010 1:52 PM  Follow-up for Phone Call        Pt states that since starting the Lithium last week, he's been experiencing problems with his balance.  It is worse in the am and at hs. Has not actually fallen but does feel like it is getting worse.  Discussed with Dr. Deirdre Priest and he recommends that he come in to be checked and possibly have a Lithium level drawn.  Appt made for tomorrow am. Follow-up by: Dennison Nancy RN,  May 18, 2010 2:55 PM

## 2010-06-30 NOTE — Letter (Signed)
Summary: Colonoscopy Letter  [facility]  [address] [place], [place] [address]   Phone: [phone]  Fax: [phone]      June 08, 2010 MRN: [id number]   [name] [address] [place], [place]  [address]   Dear Mr. Tritz,   According to your medical record, it is time for you to schedule a Colonoscopy. The American Cancer Society recommends this procedure as a method to detect early colon cancer. Patients with a family history of colon cancer, or a personal history of colon polyps or inflammatory bowel disease are at increased risk.  This letter has been generated based on the recommendations made at the time of your procedure. If you feel that in your particular situation this may no longer apply, please contact our office.  Please call our office at [phone] to schedule this appointment or to update your records at your earliest convenience.  Thank you for cooperating with [place] to provide you with the very best care possible.   Sincerely,  Judie Petit T. Russella Dar, M.D.  [facility] [phone]

## 2010-06-30 NOTE — Assessment & Plan Note (Signed)
Summary: cough,tcb   Vital Signs:  Patient profile:   55 year old male Weight:      167.6 pounds Temp:     97.8 degrees F oral Pulse rate:   88 / minute Pulse rhythm:   regular BP sitting:   116 / 81  (left arm) Cuff size:   regular  Vitals Entered By: Loralee Pacas CMA (September 09, 2009 10:18 AM)   Primary Care Provider:  Doralee Albino MD   History of Present Illness: Cough for 1 week. worse at night.  NO rhinorrhea.  + productive of black phlegm.  NO fevers chills night sweats, ? weight loss (pants looser),  No TB contacts, but has spent time in prison.  Does have h/o lung CA.  In remission for 4 years. Last CT scan 05/20/09.  Started smoking again.  Has rx for wellbutrin.  Had been in smoking cessation classes at [facility] for short while, but then restarted smoking, went through classes again and quit again.   Concerned he may be sleep walking.  Reports he can't sleep if he doesnt take his meds, but concerned that things are out of place in his house.   Has lip lesion for about 2 weeks.  Starting to get little painful.  No h/o lesions like this before.  Habits & Providers  Alcohol-Tobacco-Diet     Tobacco Status: current     Tobacco Counseling: to quit use of tobacco products     Cigarette Packs/Day: 0.25  Current Medications (verified): 1)  Ibuprofen 600 Mg Tabs (Ibuprofen) .... Take 1 Tablet By Mouth Every Six Hours 2)  Budeprion Sr 150 Mg Xr12h-Tab (Bupropion Hcl) .... One By Mouth Twice Daily 3)  Omeprazole 20 Mg Cpdr (Omeprazole) .... One By Mouth Two Times A Day 4)  Loratadine 10 Mg Tabs (Loratadine) .... One By Mouth Daily For Allergies 5)  Zolpidem Tartrate 10 Mg Tabs (Zolpidem Tartrate) .... One By Mouth At Bedtime 6)  Aspirin 81 Mg Tabs (Aspirin) .... One Tab By Mouth Daily 7)  Imitrex 50 Mg Tabs (Sumatriptan Succinate) .... One Tab By Mouth At The First Sign of Migraine Headache, May Take Another in 2h If No Improvement. 8)  Flexeril 10 Mg Tabs (Cyclobenzaprine Hcl)  .... One By Mouth At Bedtime 9)  Gabapentin 100 Mg Caps (Gabapentin) .... One By Mouth Tid  Allergies: 1)  Metoprolol Succinate  Past History:  Past Medical History: Last updated: 05/10/2009 bilateral hydrocele 603.9 CT proven repeat hydrocoel surg 2/08 Hx SCCA lung, in remission since 2006, s/p cisplatin/etopiside/radiation.  Social History: previously incarcerated; recovering narcotic addict Smoked for years quit, then restarted. ( 09/09/09)  Review of Systems       see HPI  Physical Exam  General:  Well-developed,well-nourished,in no acute distress; alert,appropriate and cooperative throughout examination Mouth:  0.5 ulcer right side of lower lip.  No discharge.  No other lesions.  Slight crusting. Lungs:  Good air movent.  Clear to percussion. Pre - neb: Wheezes throughout, exp > insp. Post neb: minimal wheezing Heart:  Normal rate and regular rhythm. S1 and S2 normal without gallop, murmur, click, rub or other extra sounds. Psych:  Cognition and judgment appear intact. Alert and cooperative with normal attention span and concentration. No apparent delusions, illusions, hallucinations   Impression & Recommendations:  Problem # 1:  COUGH (ICD-786.2) Given h/o lung ca and current hempotysis, will get CXR now.  No other red flags for TB, and has been PPD neg while in prison.  However,  will return tomorrow for PPD placement.  Much improved with albuterol.  Will give rx for inhaler and spacer.  CT report reviewed from 05/20/09. Orders: CXR- 2view (CXR) Albuterol Sulfate Sol 1mg  unit dose (E4540) [facility]- Est  Level 4 (98119)  Problem # 2:  TOBACCO DEPENDENCE (ICD-305.1)  Pt counseled on cessation and given resources (smoking cessation classes at [facility], QUITLINE).    Orders: [facility]- Est  Level 4 ([address]782)  Problem # 3:  INSOMNIA NOS (ICD-780.52)  Sleeping well on current regimen, but concerned about the meds.  I offered to switch to another agent, but pt declined and said he  would discus with Dr. Leveda Anna. His updated medication list for this problem includes:    Zolpidem Tartrate 10 Mg Tabs (Zolpidem tartrate) ..... One by mouth at bedtime  Orders: [facility]- Est  Level 4 ([address]214)  Problem # 4:  LESION, LIP (ICD-210.0)  Unclear etiology.  TIme course not consistent with HSV.  Pt to observe it for 2 more weeks.  He has follow up appt with derm, so he will see them if lesions not resolved in 2 weeks.  Orders: [facility]- Est  Level 4 (95621)  Complete Medication List: 1)  Ibuprofen 600 Mg Tabs (Ibuprofen) .... Take 1 tablet by mouth every six hours 2)  Budeprion Sr 150 Mg Xr12h-tab (Bupropion hcl) .... One by mouth twice daily 3)  Omeprazole 20 Mg Cpdr (Omeprazole) .... One by mouth two times a day 4)  Loratadine 10 Mg Tabs (Loratadine) .... One by mouth daily for allergies 5)  Zolpidem Tartrate 10 Mg Tabs (Zolpidem tartrate) .... One by mouth at bedtime 6)  Aspirin 81 Mg Tabs (Aspirin) .... One tab by mouth daily 7)  Imitrex 50 Mg Tabs (Sumatriptan succinate) .... One tab by mouth at the first sign of migraine headache, may take another in 2h if no improvement. 8)  Flexeril 10 Mg Tabs (Cyclobenzaprine hcl) .... One by mouth at bedtime 9)  Gabapentin 100 Mg Caps (Gabapentin) .... One by mouth tid 10)  Proventil Hfa 108 (90 Base) Mcg/act Aers (Albuterol sulfate) .... 2 puffs with a spacer every 6 hours as needed for wheezing 11)  Pocket Spacer Devi (Spacer/aero-holding chambers) .... Use as directed with your inhaler  Patient Instructions: 1)  Please take the paper over to radiology in the hospital and get a chest x ray done. 2)  Please make an appt soon to talk with Dr. Leveda Anna about the sleeping  pills. 3)  We will give you a prescription for an inhaler to help with the breathing. 4)  If the sore on your lip is not better in 2 weeks, please go see your dermatologist. 5)  Please consider our smoking cessation classes. Prescriptions: POCKET SPACER  DEVI  (SPACER/AERO-HOLDING CHAMBERS) Use as directed with your inhaler  #1 x 0   Entered and Authorized by:   Demorris Choyce [place] MD   Signed by:   Taden Witter [place] MD on 09/09/2009   Method used:   Electronically to        [facility] [phone]* (retail)       [address]       [place], [place]  [address]       Ph: [phone] or [phone]       Fax: [phone]   RxID:   [id number] PROVENTIL HFA 108 (90 BASE) MCG/ACT AERS (ALBUTEROL SULFATE) 2 puffs with a spacer every 6 hours as needed  for wheezing  #1 x 1   Entered and Authorized by:   Chevy Virgo [place] MD   Signed by:   Jacquelynne Guedes [place] MD on 09/09/2009   Method used:   Electronically to        [facility] [phone]* (retail)       [address]       [place], [place]  [address]       Ph: [phone] or [phone]       Fax: [phone]   RxID:   [id number]    Medication Administration  Medication # 1:    Medication: Albuterol Sulfate Sol 1mg  unit dose    Diagnosis: COUGH (ICD-786.2)    Dose: 3mL    Route: inhaled    Exp Date: 01/28/2011    Lot #: [id number]    Mfr: Nephron    Patient tolerated medication without complications    Given by: Loralee Pacas CMA (September 09, 2009 11:31 AM)  Orders Added: 1)  CXR- 2view [CXR] 2)  Albuterol Sulfate Sol 1mg  unit dose [J7613] 3)  [facility]- Est  Level 4 [[address]387]

## 2010-06-30 NOTE — Miscellaneous (Signed)
Summary: ventolin rx (covered by medicare)  Medications Added VENTOLIN HFA 108 (90 BASE) MCG/ACT AERS (ALBUTEROL SULFATE) 2 puffs q 6 hours as needed for wheezing       Clinical Lists Changes  Medications: Added new medication of VENTOLIN HFA 108 (90 BASE) MCG/ACT AERS (ALBUTEROL SULFATE) 2 puffs q 6 hours as needed for wheezing - Signed Rx of VENTOLIN HFA 108 (90 BASE) MCG/ACT AERS (ALBUTEROL SULFATE) 2 puffs q 6 hours as needed for wheezing;  #1 x 3;  Signed;  Entered by: Sarah [place] MD;  Authorized by: Sarah [place] MD;  Method used: Electronically to [facility] [phone]*, [address] [place], [place], [place]  [address], Ph: [phone] or [phone], Fax: [phone]    Prescriptions: VENTOLIN HFA 108 (90 BASE) MCG/ACT AERS (ALBUTEROL SULFATE) 2 puffs q 6 hours as needed for wheezing  #1 x 3   Entered and Authorized by:   Sarah [place] MD   Signed by:   Sarah [place] MD on 09/16/2009   Method used:   Electronically to        [facility] [phone]* (retail)       [address]       [place], [place]  [address]       Ph: [phone] or [phone]       Fax: [phone]   RxID:   [id number]

## 2010-06-30 NOTE — Letter (Signed)
Summary: Handout Printed  Printed Handout:  - Headache, Migraine 

## 2010-06-30 NOTE — Consult Note (Signed)
Summary: [facility]  [facility] Hem[facility]Onc   Imported By: De Nurse 03/16/2010 14:08:54  _____________________________________________________________________  External Attachment:    Type:   Image     Comment:   External Document

## 2010-06-30 NOTE — Assessment & Plan Note (Signed)
 Summary: see triage notes/[facility]el[facility] pt   Vital Signs:  Patient profile:   55 year old male Weight:      168.2 pounds Temp:     98.7 degrees F oral Pulse rate:   109 / minute Pulse rhythm:   regular BP sitting:   121 / 87  (right arm) Cuff size:   regular  Vitals Entered By: Bascom Pica CMA (May 10, 2009 1:58 PM)  Primary Care Provider:  ELSIE PAO MD   History of Present Illness: 55yo M with hx SCCA lung.  Last Friday, 2 minute episode of room spinning  Felt like he was going to pass out. Double vision progressing to room spinning.  A friend caught him so he didn't fall.  Associated symptoms included numbness on left side of face that felt like it had been shot with novocaine, difficulty speaking.  No sensation of numb/weak in the rest of body, no palpitations, no CP, no SOB, no sweating, no nausea, did not turn head prior to symptoms.  Started to have a HA approx 15-30 mins after spinning room and facial numbness.  Pt is followed by [facility], SCCA lung in remission since 2006.  Had Cisplatin/etoposide and radiation, serial CT scans of chest neg for recurrence, CT scans head neg for mets, MRI brain in 2007 neg for mets.  BP and Lipids well controlled.  Current Medications (verified): 1)  Ibuprofen  600 Mg Tabs (Ibuprofen ) .... Take 1 Tablet By Mouth Every Six Hours 2)  Budeprion Sr 150 Mg Xr12h-Tab (Bupropion  Hcl) .... One By Mouth Twice Daily 3)  Omeprazole  20 Mg Cpdr (Omeprazole ) .... One By Mouth Two Times A Day 4)  Loratadine  10 Mg Tabs (Loratadine ) .... One By Mouth Daily For Allergies 5)  Triamcinolone  Acetonide 0.5 % Oint (Triamcinolone  Acetonide) .... Apply Hands Two Times A Day  Disp: Large Tube 6)  Zolpidem  Tartrate 10 Mg Tabs (Zolpidem  Tartrate) .... One By Mouth At Bedtime 7)  Aspirin  81 Mg Tabs (Aspirin ) .... One Tab By Mouth Daily 8)  Topamax 50 Mg Tabs (Topiramate) .... One Half Tab By Mouth Two Times A Day X1 Week, Then One Tab By Mouth Two Times  A Day. 9)  Imitrex 50 Mg Tabs (Sumatriptan Succinate) .... One Tab By Mouth At The First Sign of Migraine Headache, May Take Another in 2h If No Improvement.  Allergies (verified): 1)  Metoprolol Succinate  Past History:  Past Surgical History: Last updated: 07/24/2007 hydrocele repair - 10/27/2005, radiation Rx for lung ca - 01/27/2005  Family History: Last updated: 09/14/2006 Family History of Alcoholism/Addiction Family History Hypertension Family History Lung cancer  Social History: Last updated: 08/30/2007 previously incarcerated; recovering narcotic addict Smoked for years quit 2/09  Past Medical History: bilateral hydrocele 603.9 CT proven repeat hydrocoel surg 2/08 Hx SCCA lung, in remission since 2006, s/p cisplatin/etopiside/radiation.  Review of Systems       See HPI  Physical Exam  General:  Well-developed,well-nourished,in no acute distress; alert,appropriate and cooperative throughout examination Head:  Normocephalic and atraumatic without obvious abnormalities. Eyes:  No corneal or conjunctival inflammation noted. EOMI. Perrla.  Ears:  External ear exam shows no significant lesions or deformities.  Nose:  External nasal examination shows no deformity or inflammation. Mouth:  Oral mucosa and oropharynx without lesions or exudates.   Neck:  No deformities, masses, or tenderness noted. Lungs:  Normal respiratory effort, chest expands symmetrically. Lungs are clear to auscultation, no crackles or wheezes. Heart:  Normal rate and regular rhythm.  S1 and S2 normal without gallop, murmur, click, rub or other extra sounds. Neurologic:  No cranial nerve deficits noted. Station and gait are normal. Plantar reflexes are down-going bilaterally. DTRs are symmetrical throughout. Sensory, motor and coordinative functions appear intact. Skin:  Dry, cracked, red lesions macular lesions on palms of the hands; no lesions on the feet or rest of the body, these lesions are improved  since last visit per pt. Additional Exam:  Dix-Hallpike negative. ECG: Sinus, no S-T changes, normal PR, QTc. Rate 97.   Impression & Recommendations:  Problem # 1:  INTERMITTENT VERTIGO (ICD-780.4) Assessment New Symptoms suggestive of vestibular migraine.  Will tx with topamax taper up to 50mg  two times a day, Imitrex as needed as he has been having biweekly HA.  Pt should come back to see Dr. Scarlet if further/worsening episides as he would need an MRI brain at that point.  TIA unlikely with normal BP and lipids.  Pt no longer smoking.  Handout given on migraines.  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  Problem # 3:  Preventive Health Care (ICD-V70.0) Assessment: Comment Only Started ASA-81 once daily.  Complete Medication List: 1)  Ibuprofen  600 Mg Tabs (Ibuprofen ) .... Take 1 tablet by mouth every six hours 2)  Budeprion Sr 150 Mg Xr12h-tab (Bupropion  hcl) .... One by mouth twice daily 3)  Omeprazole  20 Mg Cpdr (Omeprazole ) .... One by mouth two times a day 4)  Loratadine  10 Mg Tabs (Loratadine ) .... One by mouth daily for allergies 5)  Triamcinolone  Acetonide 0.5 % Oint (Triamcinolone  acetonide) .... Apply hands two times a day  disp: large tube 6)  Zolpidem  Tartrate 10 Mg Tabs (Zolpidem  tartrate) .... One by mouth at bedtime 7)  Aspirin  81 Mg Tabs (Aspirin ) .... One tab by mouth daily 8)  Topamax 50 Mg Tabs (Topiramate) .... One half tab by mouth two times a day x1 week, then one tab by mouth two times a day. 9)  Imitrex 50 Mg Tabs (Sumatriptan succinate) .... One tab by mouth at the first sign of migraine headache, may take another in 2h if no improvement.  Patient Instructions: 1)  It sounds as though you may be having a special kind of migraine called a vestibular migraine. 2)  Take an aspirin  a day 3)  Topamax two times a day as below to  prevent migraines. 4)  Imitrex 50mg  at the first sign of a migraine as below. 5)  Come back to see Dr. Scarlet if you have another episode. 6)  -Dr. ONEIDA. Prescriptions: IMITREX 50 MG TABS (SUMATRIPTAN SUCCINATE) One tab by mouth at the first sign of migraine headache, may take another in 2h if no improvement.  #9 x 0   Entered and Authorized by:   Debby Petties MD   Signed by:   Debby Petties MD on 05/10/2009   Method used:   Electronically to        [facility] [phone]* (retail)       [address]       [place], [place]  [address]       Ph: [phone] or [phone]       Fax: [phone]   RxID:   [id number] TOPAMAX 50 MG TABS (TOPIRAMATE) One half tab by mouth two times a day x1 week, then one  tab by mouth two times a day.  #60 x 0   Entered and Authorized by:   Debby Petties MD   Signed by:   Debby Petties MD on 05/10/2009   Method used:   Electronically to        [facility] [phone]* (retail)       [address]       [place], [place]  [address]       Ph: [phone] or [phone]       Fax: [phone]   RxID:   [id number] ASPIRIN  81 MG TABS (ASPIRIN ) One tab by mouth daily  #90 x 11   Entered and Authorized by:   Debby Petties MD   Signed by:   Debby Petties MD on 05/10/2009   Method used:   Electronically to        [facility] [phone]* (retail)       [address]       [place], [place]  [address]       Ph: [phone] or [phone]       Fax: [phone]   RxID:   [id number]

## 2010-06-30 NOTE — Progress Notes (Signed)
   Phone Note Other Incoming   Caller: ED Summary of Call: Pt seen and d/c from ED 8/21 for dizzyness. PCP askes to make sure pt has follow up appt this week. Phone note sent to admin team. Workup unremarkable.  Initial call taken by: Clementeen Graham MD,  January 16, 2010 11:55 PM

## 2010-06-30 NOTE — Progress Notes (Signed)
Summary: triage  Medications Added GABAPENTIN 300 MG CAPS (GABAPENTIN) one by mouth four times a day       Phone Note Call from Patient Call back at [facility] Phone [phone]   Caller: Patient Summary of Call: Pt has been uable to sleep for 2 to 3 weeks. Initial call taken by: Clydell Hakim,  December 14, 2009 1:43 PM  Follow-up for Phone Call        states he is stressed out.  had CT last week. "they found something growing on my lung"  hx lung cancer 5 yrs ago poor sleep since. using his flexeril & zolpidem w/o any relief. appt with pcp tomorrow to address Follow-up by: Golden Circle RN,  December 14, 2009 1:46 PM  Additional Follow-up for Phone Call Additional follow up Details #1::        Reviewed PET scan which is reassuring for a non neoplastic process.  Informed patient.  He has a CT lung fu in 3 months scheduled by onc.  Sleeplessness is chronic problem.  Discussed sleep hygiene.  Increased his gabapentin for his neck pain to qid.  Will see in two weeks. Additional Follow-up by: Doralee Albino MD,  December 14, 2009 4:24 PM    New/Updated Medications: GABAPENTIN 300 MG CAPS (GABAPENTIN) one by mouth four times a day

## 2010-06-30 NOTE — Progress Notes (Signed)
Summary: Rx Prob   Phone Note Call from Patient   Caller: Patient Summary of Call: Pt says the rx Zolpidem the pharmacy says they do not have it. Initial call taken by: Clydell Hakim,  Oct 01, 2009 3:02 PM  Follow-up for Phone Call        pharmacy has not received fax for generic Ambien. Rx called to pharmacy.   Follow-up by: Theresia Lo RN,  Oct 01, 2009 3:12 PM

## 2010-06-30 NOTE — Miscellaneous (Signed)
Summary: gave NPI to Dr. Carlos Levering ofc   Clinical Lists Changes pt was seen by Dr. Amanda Pea in ED for a finger injury on 03/01/10. I gave NPI to Lawson Fiscal from that office [phone].Marland KitchenGolden Circle RN  March 07, 2010 11:35 AM noted and agree

## 2010-06-30 NOTE — Progress Notes (Signed)
   Phone Note Call from Patient   Caller: Patient Call For: [phone] Summary of Call: Pt returning your call back.  Was out when you called orginally.  Will not be leaving home until he hear from you Initial call taken by: Abundio Miu,  April 04, 2010 3:26 PM  Follow-up for Phone Call        called and discussed Follow-up by: Doralee Albino MD,  April 04, 2010 4:18 PM

## 2010-06-30 NOTE — Assessment & Plan Note (Signed)
Summary: Balance issues/kf   Vital Signs:  Patient profile:   55 year old male Height:      66.75 inches Weight:      173.2 pounds BMI:     27.43 Temp:     98.6 degrees F oral Pulse rate:   106 / minute Pulse (ortho):   100 / minute BP sitting:   118 / 79  (left arm) BP standing:   120 / 84 Cuff size:   regular  Vitals Entered By: Jimmy Footman, CMA (May 19, 2010 9:45 AM)  Serial Vital Signs/Assessments:  Time      Position  BP       Pulse  Resp  Temp     By 10:33 AM  Lying RA  116/84   98                    Robert Busick CMA 10:35 AM  Sitting   120/88   96                    Robert Busick CMA 10:37 AM  Standing  120/84   100                   Robert Busick CMA  CC: balance issues due to new med Is Patient Diabetic? No   Primary Care Provider:  Doralee Albino MD  CC:  balance issues due to new med.  History of Present Illness: 55 yo M:  1. Balance Issues: Patient recently started on Lithium trial for Bipolar DO. Endorses dizziness and diarrhea after 3-4 days use. Took medication for 1.5 weeks (stopped yesterday). States that he is feeling slightly better today. Symptoms: Dizziness (1) feeling of head spinning while lying down, and (2) feeling of almost passing out when stands up sometimes. Diarrhea, loose stool x a few times per day, for almost one week. No blood. Has chronic HA. No vision changes, ear pain, decreased hearing, ear fullness, tinnitus, CP, SOB, URI s/s, N/V/C, LE edema, rash, fall. He smokes 1/2 ppd. He denies ETOH and drug use. Hx of lung cancer.  Habits & Providers  Alcohol-Tobacco-Diet     Tobacco Status: current  Current Medications (verified): 1)  Omeprazole 20 Mg Cpdr (Omeprazole) .... One By Mouth Two Times A Day 2)  Aspirin 81 Mg Tabs (Aspirin) .... One Tab By Mouth Daily 3)  Flexeril 10 Mg Tabs (Cyclobenzaprine Hcl) .... One By Mouth At Bedtime 4)  Proventil Hfa 108 (90 Base) Mcg/act Aers (Albuterol Sulfate) .... 2 Puffs With A Spacer Every 6  Hours As Needed For Wheezing 5)  Pocket Spacer  Devi (Spacer/aero-Holding [place]) .... Use As Directed With Your Inhaler 6)  Lorazepam 1 Mg Tabs (Lorazepam) .... One By Mouth Two Times A Day As Needed Anxiety Must Last One Month Between Refills. 7)  Ibuprofen 600 Mg Tabs (Ibuprofen) .... One By Mouth Q6h As Needed Pain.  Take With Food 8)  Lithium Carbonate 300 Mg Caps (Lithium Carbonate) .... One By Mouth Four Times Per Day.  Allergies (verified): 1)  Metoprolol Succinate PMH-FH-SH reviewed for relevance  Review of Systems      See HPI  Physical Exam  General:  Well-developed,well-nourished, in no acute distress; alert, appropriate and cooperative throughout examination. Vitals reviewed - tachycardia. Smells of smoke. Head:  Normocephalic and atraumatic without obvious abnormalities.  Eyes:  No corneal or conjunctival inflammation noted. EOMI. Perrla. Funduscopic exam benign, without hemorrhages, exudates or papilledema. Vision grossly normal.  Ears:  R ear normal and L ear normal.   Nose:  External nasal examination shows no deformity or inflammation. Nasal mucosa are pink and moist without lesions or exudates. Mouth:  Oral mucosa and oropharynx without lesions or exudates.   Neck:  No deformities, masses, or tenderness noted. Lungs:  Decreased breath sounds bilaterally. No increased WOB. Normal RR. No w/r/r. Heart:  Normal rate and regular rhythm. S1 and S2 normal without gallop, murmur, click, rub or other extra sounds. Pulses:  2+ DP. Extremities:  No clubbing, cyanosis, edema,. Neurologic:  Alert & oriented X3, cranial nerves II-XII intact, strength normal in all extremities, sensation intact to light touch, DTRs symmetrical and normal, finger-to-nose normal, and Romberg negative. Stumbled x 1 when getting on table, but not ataxic. Questionable speech impediment - not new per patient. Psych:  Oriented X3, memory intact for recent and remote, and normally interactive.   Additional  Exam:  EKG NSR, no ST/T changes, no QT prolongation.   Impression & Recommendations:  Problem # 1:  DIZZINESS (ICD-780.4) Assessment New No red flags today. Patient describes both vertigo and presyncopy. Normal EKG and orthostatics. Neurological and cardiac exam WNL. Likely 2/2 new medication (though likely NOT toxic) as patient endorses feeling a little better today. Will check CBC as patient endorses fatigue but no sources of blood loss. He does have Hx of EOTH. Will check CMP, Lithium. Will also check TSH with Hx of tachycardia. Orders: Comp Met-[facility] [phone]) CBC-[facility] ([id number]) Miscellaneous Lab Charge-[facility] [phone]) EKG- [facility] (EKG)  Complete Medication List: 1)  Omeprazole 20 Mg Cpdr (Omeprazole) .... One by mouth two times a day 2)  Aspirin 81 Mg Tabs (Aspirin) .... One tab by mouth daily 3)  Flexeril 10 Mg Tabs (Cyclobenzaprine hcl) .... One by mouth at bedtime 4)  Proventil Hfa 108 (90 Base) Mcg/act Aers (Albuterol sulfate) .... 2 puffs with a spacer every 6 hours as needed for wheezing 5)  Pocket Spacer Devi (Spacer/aero-holding chambers) .... Use as directed with your inhaler 6)  Lorazepam 1 Mg Tabs (Lorazepam) .... One by mouth two times a day as needed anxiety must last one month between refills. 7)  Ibuprofen 600 Mg Tabs (Ibuprofen) .... One by mouth q6h as needed pain.  take with food 8)  Lithium Carbonate 300 Mg Caps (Lithium carbonate) .... One by mouth four times per day.  Other Orders: TSH-[facility] ([id number])  Patient Instructions: 1)  It was nice to meet you today. 2)  Do not take the Lithium until you see Dr. Leveda Anna again. Follow up next week for a recheck.   Orders Added: 1)  Comp Met-[facility] [[id number]] 2)  CBC-[facility] [[id number]] 3)  Miscellaneous Lab Charge-[facility] [[id number]] 4)  EKG- [facility] [EKG] 5)  TSH-[facility] [[id number]]  Appended Document: Orders Update     Clinical Lists Changes  Orders: Added new Test order of [facility]- Est  Level 4 (44010) - Signed

## 2010-06-30 NOTE — Progress Notes (Signed)
Summary: meds  Medications Added NICODERM CQ 14 MG/24HR PT24 (NICOTINE) Apply one transdermal patch one time daily DISP 1 box       Phone Note Call from Patient Call back at Home Phone [phone]   Caller: Patient Summary of Call: would like patches to quit smoking - [facility] [address] starting smoking classes at [facility] next week Initial call taken by: De Nurse,  June 15, 2010 2:02 PM  Follow-up for Phone Call        starts classes on 06/21/2010 has been on patches before and welbutrin. cannot take chantix.   Follow-up by: Loralee Pacas CMA,  June 15, 2010 4:30 PM    New/Updated Medications: NICODERM CQ 14 MG/24HR PT24 (NICOTINE) Apply one transdermal patch one time daily DISP 1 box Prescriptions: NICODERM CQ 14 MG/24HR PT24 (NICOTINE) Apply one transdermal patch one time daily DISP 1 box  #1 x 3   Entered and Authorized by:   Paula Compton MD   Signed by:   Paula Compton MD on 06/15/2010   Method used:   Electronically to        [facility] [address] [phone]* (retail)       [address]       [place], [place]  [address]       Ph: [phone] or [phone]       Fax: [phone]   RxID:   [id number]  routed to dr.[email or web address].Loralee Pacas CMA  June 15, 2010 4:45 PM  Appended Document: meds called and left message with Onalee Hua pt's brother about patches.

## 2010-06-30 NOTE — Consult Note (Signed)
Summary: [facility] Hemat[facility]/Oncology  [facility] Hematology/Oncology   Imported By: Clydell Hakim 06/09/2009 15:58:50  _____________________________________________________________________  External Attachment:    Type:   Image     Comment:   External Document

## 2010-06-30 NOTE — Progress Notes (Signed)
  Medications Added LORAZEPAM 1 MG TABS (LORAZEPAM) one by mouth two times a day as needed anxiety Must last one month between refills.       Phone Note Call from Patient   Caller: Patient Call For: [phone] Summary of Call: Pt says Lorazepam 1 mg is helping you with your nerves, but having difficulty sleeping.  Need to know if there is something else can be given to help with that Initial call taken by: Abundio Miu,  March 30, 2010 2:03 PM  Follow-up for Phone Call        No, we have tried many medications that have not worked.  I do not have an answer for his sleeplessnes. Follow-up by: Doralee Albino MD,  March 30, 2010 4:31 PM  Additional Follow-up for Phone Call Additional follow up Details #1::        I have tried x 2 to call, not home. Additional Follow-up by: Doralee Albino MD,  March 31, 2010 12:56 PM    Additional Follow-up for Phone Call Additional follow up Details #2::    Avien is back home and don't intend to go anywhere else today.  Please call back Follow-up by: Abundio Miu,  April 01, 2010 1:32 PM  Additional Follow-up for Phone Call Additional follow up Details #3:: Details for Additional Follow-up Action Taken: Called and discussed.  Will stick with lorazepam.  Given refill Additional Follow-up by: Doralee Albino MD,  April 04, 2010 4:22 PM  New/Updated Medications: LORAZEPAM 1 MG TABS (LORAZEPAM) one by mouth two times a day as needed anxiety Must last one month between refills. Prescriptions: LORAZEPAM 1 MG TABS (LORAZEPAM) one by mouth two times a day as needed anxiety Must last one month between refills.  #45 x 5   Entered and Authorized by:   Doralee Albino MD   Signed by:   Doralee Albino MD on 04/04/2010   Method used:   Printed then faxed to ...       [facility]  [address] [phone]* (retail)       [address]       [place], [place]  [address]       Ph: [phone] or [phone]       Fax:  [phone]   RxID:   [id number]

## 2010-06-30 NOTE — Assessment & Plan Note (Signed)
Summary: fu and tb test/kh   Vital Signs:  Patient profile:   55 year old male Height:      66.75 inches Weight:      167.1 pounds BMI:     26.46 Temp:     97.8 degrees F oral Pulse rate:   98 / minute BP sitting:   113 / 78  (left arm) Cuff size:   regular  Vitals Entered By: Gladstone Pih (September 10, 2009 9:36 AM) CC: F/U discuss refills on sleeping pills Is Patient Diabetic? No Pain Assessment Patient in pain? no      Comments place tb per Dr Swaziland   Primary Care Provider:  Doralee Albino MD  CC:  F/U discuss refills on sleeping pills.  History of Present Illness: Fu cough, still bad.  CXR normal Has had some jewelry go missing - unexplained.  In past he has had somnabulation with sleeping meds.  No one has observed sleep disturbance although he has mult folks in house.    Habits & Providers  Alcohol-Tobacco-Diet     Tobacco Status: current     Tobacco Counseling: to quit use of tobacco products     Cigarette Packs/Day: <0.25  Current Medications (verified): 1)  Ibuprofen 600 Mg Tabs (Ibuprofen) .... Take 1 Tablet By Mouth Every Six Hours 2)  Budeprion Sr 150 Mg Xr12h-Tab (Bupropion Hcl) .... One By Mouth Twice Daily 3)  Omeprazole 20 Mg Cpdr (Omeprazole) .... One By Mouth Two Times A Day 4)  Loratadine 10 Mg Tabs (Loratadine) .... One By Mouth Daily For Allergies 5)  Zolpidem Tartrate 10 Mg Tabs (Zolpidem Tartrate) .... One By Mouth At Bedtime 6)  Aspirin 81 Mg Tabs (Aspirin) .... One Tab By Mouth Daily 7)  Imitrex 50 Mg Tabs (Sumatriptan Succinate) .... One Tab By Mouth At The First Sign of Migraine Headache, May Take Another in 2h If No Improvement. 8)  Flexeril 10 Mg Tabs (Cyclobenzaprine Hcl) .... One By Mouth At Bedtime 9)  Gabapentin 100 Mg Caps (Gabapentin) .... One By Mouth Tid 10)  Proventil Hfa 108 (90 Base) Mcg/act Aers (Albuterol Sulfate) .... 2 Puffs With A Spacer Every 6 Hours As Needed For Wheezing 11)  Pocket Spacer  Devi (Spacer/aero-Holding  [place]) .... Use As Directed With Your Inhaler  Allergies (verified): 1)  Metoprolol Succinate  Past History:  Past medical, surgical, family and social histories (including risk factors) reviewed, and no changes noted (except as noted below).  Past Medical History: Reviewed history from 05/10/2009 and no changes required. bilateral hydrocele 603.9 CT proven repeat hydrocoel surg 2/08 Hx SCCA lung, in remission since 2006, s/p cisplatin/etopiside/radiation.  Past Surgical History: Reviewed history from 07/24/2007 and no changes required. hydrocele repair - 10/27/2005, radiation Rx for lung ca - 01/27/2005  Family History: Reviewed history from 09/14/2006 and no changes required. Family History of Alcoholism/Addiction Family History Hypertension Family History Lung cancer  Social History: Reviewed history from 09/09/2009 and no changes required. previously incarcerated; recovering narcotic addict Smoked for years quit, then restarted. ( 09/09/09)Packs/Day:  <0.25  Physical Exam  General:  Well-developed,well-nourished,in no acute distress; alert,appropriate and cooperative throughout examination Lungs:  Clear bilaterally   Impression & Recommendations:  Problem # 1:  COPD (ICD-496) Assessment Unchanged  no change in meds for now.  May need spiriva or flovent.  Return Monday to read tb skin test. His updated medication list for this problem includes:    Proventil Hfa 108 (90 Base) Mcg/act Aers (Albuterol sulfate) .Marland Kitchen... 2 puffs  with a spacer every 6 hours as needed for wheezing  Orders: [facility]- Est Level  3 (16109)  Problem # 2:  INSOMNIA NOS (ICD-780.52)  Doubt somambulism.  Will observe. His updated medication list for this problem includes:    Zolpidem Tartrate 10 Mg Tabs (Zolpidem tartrate) ..... One by mouth at bedtime  Orders: [facility]- Est Level  3 (60454)  Complete Medication List: 1)  Ibuprofen 600 Mg Tabs (Ibuprofen) .... Take 1 tablet by mouth every six  hours 2)  Budeprion Sr 150 Mg Xr12h-tab (Bupropion hcl) .... One by mouth twice daily 3)  Omeprazole 20 Mg Cpdr (Omeprazole) .... One by mouth two times a day 4)  Loratadine 10 Mg Tabs (Loratadine) .... One by mouth daily for allergies 5)  Zolpidem Tartrate 10 Mg Tabs (Zolpidem tartrate) .... One by mouth at bedtime 6)  Aspirin 81 Mg Tabs (Aspirin) .... One tab by mouth daily 7)  Imitrex 50 Mg Tabs (Sumatriptan succinate) .... One tab by mouth at the first sign of migraine headache, may take another in 2h if no improvement. 8)  Flexeril 10 Mg Tabs (Cyclobenzaprine hcl) .... One by mouth at bedtime 9)  Gabapentin 100 Mg Caps (Gabapentin) .... One by mouth tid 10)  Proventil Hfa 108 (90 Base) Mcg/act Aers (Albuterol sulfate) .... 2 puffs with a spacer every 6 hours as needed for wheezing 11)  Pocket Spacer Devi (Spacer/aero-holding chambers) .... Use as directed with your inhaler  Other Orders: TB Skin Test [phone]) Admin 1st Vaccine ([address]78)  Patient Instructions: 1)  Come back Monday for TB skin test.   2)  I refilled loratadine which is an allergy medicine. Prescriptions: LORATADINE 10 MG TABS (LORATADINE) one by mouth daily for allergies  #30 x 12   Entered and Authorized by:   Doralee Albino MD   Signed by:   Doralee Albino MD on 09/10/2009   Method used:   Electronically to        [facility] [address] [phone]* (retail)       [address]       [place], [place]  [address]       Ph: [phone] or [phone]       Fax: [phone]   RxID:   [id number]     Immunizations Administered:  PPD Skin Test:    Vaccine Type: PPD    Site: left forearm    Mfr: Sanofi Pasteur    Dose: 0.1 ml    Route: ID    Given by: Gladstone Pih    Exp. Date: 03/11/2011    Lot #: [id number]

## 2010-06-30 NOTE — Assessment & Plan Note (Signed)
Summary: f/u/[place]/hensel   Vital Signs:  Patient profile:   55 year old male Height:      66.75 inches Weight:      171 pounds BMI:     27.08 Temp:     98.3 degrees F oral Pulse rate:   101 / minute BP sitting:   124 / 85  (left arm) Cuff size:   regular  Vitals Entered By: Garen Grams LPN (February 03, 2010 9:52 AM) CC: f/u Is Patient Diabetic? No Pain Assessment Patient in pain? no        Primary Care Provider:  Doralee Albino MD  CC:  f/u.  History of Present Illness: Pt reports no further episodes of passing out.  Still reports shakes occ.  Has had trouble sleeping for past week in a half.  Falls asleep around 4 am and sleeps until 1:30.  Stopped the amitryptyline 150, but occ takes the 75 mg and occ the zolpidem once in awhile.  Even when he takes them, he has trouble sleeping before 4 am.  Once asleep, he sleeps fine.   When trying to sleep, lies down with lights off, listens to bluegrass on headphones.    Usally falls asleep around 4 am.  Usually tries to go to bed around 11:30, 12 am.  Watches TV, DVDs when not able to sleep FEels that Wellbutrin is not helping so much.  Has started smoking again about 1/4 ppd.  Has resources for cessation at [facility].  Still involved with group from there and participates in social outings.    Still goes to [facility].    Habits & Providers  Alcohol-Tobacco-Diet     Alcohol drinks/day: 0     Tobacco Status: current     Tobacco Counseling: to quit use of tobacco products     Cigarette Packs/Day: <0.25  Current Medications (verified): 1)  Budeprion Sr 150 Mg Xr12h-Tab (Bupropion Hcl) .... One By Mouth Twice Daily 2)  Omeprazole 20 Mg Cpdr (Omeprazole) .... One By Mouth Two Times A Day 3)  Aspirin 81 Mg Tabs (Aspirin) .... One Tab By Mouth Daily 4)  Flexeril 10 Mg Tabs (Cyclobenzaprine Hcl) .... One By Mouth At Bedtime 5)  Gabapentin 600 Mg Tabs (Gabapentin) .... One By Mouth Three Times A Day 6)  Proventil Hfa 108 (90 Base) Mcg/act  Aers (Albuterol Sulfate) .... 2 Puffs With A Spacer Every 6 Hours As Needed For Wheezing 7)  Pocket Spacer  Devi (Spacer/aero-Holding [place]) .... Use As Directed With Your Inhaler 8)  Trazodone Hcl 50 Mg Tabs (Trazodone Hcl) .... Take 1-2 Pills At Night If Needed For Trouble Sleeping  Allergies (verified): 1)  Metoprolol Succinate PMH-FH-SH reviewed-no changes except otherwise noted  Social History: Smoking Status:  current  Review of Systems       see HPI  Physical Exam  General:  Well-developed,well-nourished,in no acute distress; alert,appropriate and cooperative throughout examination. Vitals noted.  Psych:  Cognition and judgment appear intact. Alert and cooperative with normal attention span and concentration. No apparent delusions, illusions, hallucinations   Impression & Recommendations:  Problem # 1:  SYNCOPE (ICD-780.2)  Now resolved.  Likely due to meds.  Advised he stop all amitriptyline and zolpidem.  Pt agreed.  Orders: [facility]- Est Level  3 (29562)  Problem # 2:  TOBACCO USE DISORDER/SMOKER-SMOKING CESSATION DISCUSSED (ICD-305.1)  Pt aware cessation would be beneficial.  Declined resouces at [facility] - still very involved with [facility] group.    Orders: [facility]- Est Level  3 (78295)  Problem # 3:  INSOMNIA NOS (ICD-780.52)  Reviewed sleep hygiene.  May try trazadone.  Pt handout given.    Orders: [facility]- Est Level  3 (62130)  Complete Medication List: 1)  Budeprion Sr 150 Mg Xr12h-tab (Bupropion hcl) .... One by mouth twice daily 2)  Omeprazole 20 Mg Cpdr (Omeprazole) .... One by mouth two times a day 3)  Aspirin 81 Mg Tabs (Aspirin) .... One tab by mouth daily 4)  Flexeril 10 Mg Tabs (Cyclobenzaprine hcl) .... One by mouth at bedtime 5)  Gabapentin 600 Mg Tabs (Gabapentin) .... One by mouth three times a day 6)  Proventil Hfa 108 (90 Base) Mcg/act Aers (Albuterol sulfate) .... 2 puffs with a spacer every 6 hours as needed for wheezing 7)  Pocket Spacer Devi  (Spacer/aero-holding chambers) .... Use as directed with your inhaler 8)  Trazodone Hcl 50 Mg Tabs (Trazodone hcl) .... Take 1-2 pills at night if needed for trouble sleeping  Patient Instructions: 1)  We need to work on your sleep.  To teach your body to fall asleep, try to have a routine that  you do every night before bed ( drink a glass of warm milk, take a shower, go for a walk, or something else that you find calms you).  2)  Then, go to bed, but do not read or watch TV or movies while you are trying to go to sleep.  This may take awhile.  If you need to do something else because you are not sleeping, get up and sit in a chair. 3)  Try to go to bed at the same time every day and get up at the same time every morning.  4)  We can try the trazadone to help you sleep as well. 5)  Please follow up with Dr. Leveda Anna in 1 month or so.  Come back sooner if you need to. 6)  Please stop the amitriptyline and zolpidem. Prescriptions: TRAZODONE HCL 50 MG TABS (TRAZODONE HCL) Take 1-2 pills at night if needed for trouble sleeping  #45 x 0   Entered and Authorized by:   Sarah Swaziland MD   Signed by:   Sarah Swaziland MD on 02/03/2010   Method used:   Electronically to        [facility] [address] [phone]* (retail)       [address]       [place], [place]  [address]       Ph: [phone] or [phone]       Fax: [phone]   RxID:   [id number]

## 2010-06-30 NOTE — Consult Note (Signed)
Summary: [facility]  [facility]   Imported By: Knox Royalty 12/18/2009 12:51:58  _____________________________________________________________________  External Attachment:    Type:   Image     Comment:   External Document

## 2010-06-30 NOTE — Assessment & Plan Note (Signed)
Summary: DIFFICULTY SLEEPING/[facility]   Vital Signs:  Patient profile:   55 year old male Weight:      173 pounds Temp:     98.1 degrees F oral Pulse rate:   55 / minute Pulse rhythm:   regular BP sitting:   123 / 87  (left arm) Cuff size:   regular  Primary Care Provider:  Doralee Albino MD  CC:  sleeping difficulty.  History of Present Illness: Longstanding difficulty sleeping refractory to multiple different meds.   Also endorses depressive symptoms and anger symptoms.  Lots of stress at home.   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 Has been told in the past he is bipolar. Currently only sleeping 2-3 hours per day.  Current Medications (verified): 1)  Omeprazole 20 Mg Cpdr (Omeprazole) .... One By Mouth Two Times A Day 2)  Aspirin 81 Mg Tabs (Aspirin) .... One Tab By Mouth Daily 3)  Flexeril 10 Mg Tabs (Cyclobenzaprine Hcl) .... One By Mouth At Bedtime 4)  Proventil Hfa 108 (90 Base) Mcg/act Aers (Albuterol Sulfate) .... 2 Puffs With A Spacer Every 6 Hours As Needed For Wheezing 5)  Pocket Spacer  Devi (Spacer/aero-Holding [place]) .... Use As Directed With Your Inhaler 6)  Lorazepam 1 Mg Tabs (Lorazepam) .... One By Mouth Two Times A Day As Needed Anxiety Must Last One Month Between Refills. 7)  Ibuprofen 600 Mg Tabs (Ibuprofen) .... One By Mouth Q6h As Needed Pain.  Take With Food 8)  Lithium Carbonate 300 Mg Caps (Lithium Carbonate) .... One By Mouth Four Times Per Day.  Allergies (verified): 1)  Metoprolol Succinate  Physical Exam  General:  Well-developed,well-nourished,in no acute distress; alert,appropriate and cooperative throughout examination Psych:  Cognition and judgment appear intact. Alert and cooperative with normal attention span and concentration. No apparent delusions, illusions, hallucinations.  Very reasonable. in room.   Impression & Recommendations:  Problem # 1:  DISORDER, DEPRESSIVE NEC (ICD-311)  and marked sleep  disturbance refractory to multiple meds.  I am going to pursue the bipolar route and have therapeutic trial of lithium.  FU 3 weeks. The following medications were removed from the medication list:    Budeprion Sr 150 Mg Xr12h-tab (Bupropion hcl) ..... One by mouth twice daily His updated medication list for this problem includes:    Lorazepam 1 Mg Tabs (Lorazepam) ..... One by mouth two times a day as needed anxiety must last one month between refills.  Orders: [facility]- Est Level  3 (14782)  Complete Medication List: 1)  Omeprazole 20 Mg Cpdr (Omeprazole) .... One by mouth two times a day 2)  Aspirin 81 Mg Tabs (Aspirin) .... One tab by mouth daily 3)  Flexeril 10 Mg Tabs (Cyclobenzaprine hcl) .... One by mouth at bedtime 4)  Proventil Hfa 108 (90 Base) Mcg/act Aers (Albuterol sulfate) .... 2 puffs with a spacer every 6 hours as needed for wheezing 5)  Pocket Spacer Devi (Spacer/aero-holding chambers) .... Use as directed with your inhaler 6)  Lorazepam 1 Mg Tabs (Lorazepam) .... One by mouth two times a day as needed anxiety must last one month between refills. 7)  Ibuprofen 600 Mg Tabs (Ibuprofen) .... One by mouth q6h as needed pain.  take with food 8)  Lithium Carbonate 300 Mg Caps (Lithium carbonate) .... One by mouth four times per day. Prescriptions: LITHIUM CARBONATE 300 MG CAPS (LITHIUM CARBONATE) one by mouth four times per day.  #120 x 12   Entered and Authorized  by:   Doralee Albino MD   Signed by:   Doralee Albino MD on 05/11/2010   Method used:   Electronically to        [facility] [phone]* (retail)       [address]       [place], [place]  [address]       Ph: [phone] or [phone]       Fax: [phone]   RxID:   [id number] IBUPROFEN 600 MG TABS (IBUPROFEN) one by mouth q6h as needed pain.  Take with food  #100 x 4   Entered and Authorized by:   Doralee Albino MD   Signed by:   Doralee Albino MD on 05/11/2010   Method  used:   Electronically to        [facility] [phone]* (retail)       [address]       [place], [place]  [address]       Ph: [phone] or [phone]       Fax: [phone]   RxID:   [id number]    Orders Added: 1)  [facility]- Est Level  3 [[address]]

## 2010-06-30 NOTE — Assessment & Plan Note (Signed)
Summary: syncope,df   Vital Signs:  Patient profile:   55 year old male Height:      66.75 inches Weight:      176 pounds BMI:     27.87 Temp:     98.5 degrees F oral Pulse rate:   112 / minute BP sitting:   107 / 81  (right arm) Cuff size:   regular  Vitals Entered By: Jimmy Footman, CMA (January 21, 2010 1:31 PM) CC: passing out Is Patient Diabetic? No   Primary Care Provider:  Doralee Albino MD  CC:  passing out.  History of Present Illness: More syncope.  Also cough, dry mouth and sore throat.  All prblems began after amitryptaline started 12/29/09.  Seems to have worsened when dose increase 01/11/10. Hospital records reviewed.   Habits & Providers  Alcohol-Tobacco-Diet     Alcohol drinks/day: 0     Tobacco Status: quit     Tobacco Counseling: to quit use of tobacco products     Cigarette Packs/Day: <0.25     Year Quit: 2 months ago  Current Medications (verified): 1)  Ibuprofen 600 Mg Tabs (Ibuprofen) .... Take 1 Tablet By Mouth Every Six Hours 2)  Budeprion Sr 150 Mg Xr12h-Tab (Bupropion Hcl) .... One By Mouth Twice Daily 3)  Omeprazole 20 Mg Cpdr (Omeprazole) .... One By Mouth Two Times A Day 4)  Loratadine 10 Mg Tabs (Loratadine) .... One By Mouth Daily For Allergies 5)  Aspirin 81 Mg Tabs (Aspirin) .... One Tab By Mouth Daily 6)  Imitrex 50 Mg Tabs (Sumatriptan Succinate) .... One Tab By Mouth At The First Sign of Migraine Headache, May Take Another in 2h If No Improvement. 7)  Flexeril 10 Mg Tabs (Cyclobenzaprine Hcl) .... One By Mouth At Bedtime 8)  Gabapentin 600 Mg Tabs (Gabapentin) .... One By Mouth Three Times A Day 9)  Proventil Hfa 108 (90 Base) Mcg/act Aers (Albuterol Sulfate) .... 2 Puffs With A Spacer Every 6 Hours As Needed For Wheezing 10)  Pocket Spacer  Devi (Spacer/aero-Holding [place]) .... Use As Directed With Your Inhaler 11)  Ventolin Hfa 108 (90 Base) Mcg/act Aers (Albuterol Sulfate) .... 2 Puffs Q 6 Hours As Needed For Wheezing  Allergies  (verified): 1)  Metoprolol Succinate  Past History:  Past medical, surgical, family and social histories (including risk factors) reviewed, and no changes noted (except as noted below).  Past Medical History: Reviewed history from 05/10/2009 and no changes required. bilateral hydrocele 603.9 CT proven repeat hydrocoel surg 2/08 Hx SCCA lung, in remission since 2006, s/p cisplatin/etopiside/radiation.  Past Surgical History: Reviewed history from 07/24/2007 and no changes required. hydrocele repair - 10/27/2005, radiation Rx for lung ca - 01/27/2005  Family History: Reviewed history from 09/14/2006 and no changes required. Family History of Alcoholism/Addiction Family History Hypertension Family History Lung cancer  Social History: Reviewed history from 09/09/2009 and no changes required. previously incarcerated; recovering narcotic addict Smoked for years quit, then restarted. ( 09/09/09)  Physical Exam  General:  Well-developed,well-nourished,in no acute distress; alert,appropriate and cooperative throughout examination Neck:  No deformities, masses, or tenderness noted. Lungs:  Normal respiratory effort, chest expands symmetrically. Lungs are clear to auscultation, no crackles or wheezes. Heart:  Normal rate and regular rhythm. S1 and S2 normal without gallop, murmur, click, rub or other extra sounds. Extremities:  No clubbing, cyanosis, edema, or deformity noted with normal full range of motion of all joints.   Neurologic:  No cranial nerve deficits noted. Station and gait are  normal. Plantar reflexes are down-going bilaterally. DTRs are symmetrical throughout. Sensory, motor and coordinative functions appear intact.   Impression & Recommendations:  Problem # 1:  SYNCOPE (ICD-780.2)  Timing makes likely culprit amitrypataline - i.e. patient intolerant of anticholinergic effects.  Will see how he does off the medicine.  FU one week by phone to see how symptoms have  evolved.  Orders: [facility]- Est Level  3 ([address])  Complete Medication List: 1)  Ibuprofen 600 Mg Tabs (Ibuprofen) .... Take 1 tablet by mouth every six hours 2)  Budeprion Sr 150 Mg Xr12h-tab (Bupropion hcl) .... One by mouth twice daily 3)  Omeprazole 20 Mg Cpdr (Omeprazole) .... One by mouth two times a day 4)  Loratadine 10 Mg Tabs (Loratadine) .... One by mouth daily for allergies 5)  Aspirin 81 Mg Tabs (Aspirin) .... One tab by mouth daily 6)  Imitrex 50 Mg Tabs (Sumatriptan succinate) .... One tab by mouth at the first sign of migraine headache, may take another in 2h if no improvement. 7)  Flexeril 10 Mg Tabs (Cyclobenzaprine hcl) .... One by mouth at bedtime 8)  Gabapentin 600 Mg Tabs (Gabapentin) .... One by mouth three times a day 9)  Proventil Hfa 108 (90 Base) Mcg/act Aers (Albuterol sulfate) .... 2 puffs with a spacer every 6 hours as needed for wheezing 10)  Pocket Spacer Devi (Spacer/aero-holding chambers) .... Use as directed with your inhaler 11)  Ventolin Hfa 108 (90 Base) Mcg/act Aers (Albuterol sulfate) .... 2 puffs q 6 hours as needed for wheezing  Patient Instructions: 1)  Stop amitryptaline.  Call me in one week to let me know if you are feeling better.  Is your throat still sore and are you still having spells of passing out
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
## 2010-06-30 NOTE — Assessment & Plan Note (Signed)
Summary: F/U [facility]   Vital Signs:  Patient profile:   55 year old male Height:      66.75 inches Weight:      179.9 pounds BMI:     28.49 Temp:     98.2 degrees F oral Pulse rate:   108 / minute BP sitting:   130 / 81  (left arm) Cuff size:   regular  Vitals Entered By: Jimmy Footman, CMA (June 08, 2010 1:53 PM) CC: follow-up visit, med issues Is Patient Diabetic? No Pain Assessment Patient in pain? no        Primary Care Provider:  Doralee Albino MD  CC:  follow-up visit and med issues.  History of Present Illness: Bad reaction to lithium with shakes and blurred vision even in non toxic range.  Discontinued.   Still not sleeping.   Unfortunately, relapsed again on tobacco use.  Plans to attend smoking cessation classes starting next week.   Habits & Providers  Alcohol-Tobacco-Diet     Tobacco Status: current  Allergies: 1)  Metoprolol Succinate 2)  Lithium Carbonate (Lithium Carbonate)  Physical Exam  General:  Well-developed,well-nourished,in no acute distress; alert,appropriate and cooperative throughout examination Lungs:  Normal respiratory effort, chest expands symmetrically. Lungs are clear to auscultation, no crackles or wheezes. Heart:  Normal rate and regular rhythm. S1 and S2 normal without gallop, murmur, click, rub or other extra sounds.   Impression & Recommendations:  Problem # 1:  INSOMNIA NOS (ICD-780.52) Assessment Unchanged  Gave sleep hygeine handout.  Trial of remeron.  Orders: [facility]- Est Level  3 ([address])  Problem # 2:  TOBACCO USE DISORDER/SMOKER-SMOKING CESSATION DISCUSSED (ICD-305.1)  Orders: [facility]- Est Level  3 ([address])  Complete Medication List: 1)  Omeprazole 20 Mg Cpdr (Omeprazole) .... One by mouth two times a day 2)  Aspirin 81 Mg Tabs (Aspirin) .... One tab by mouth daily 3)  Flexeril 10 Mg Tabs (Cyclobenzaprine hcl) .... One by mouth at bedtime 4)  Proventil Hfa 108 (90 Base) Mcg/act Aers (Albuterol sulfate) .... 2 puffs with  a spacer every 6 hours as needed for wheezing 5)  Pocket Spacer Devi (Spacer/aero-holding chambers) .... Use as directed with your inhaler 6)  Lorazepam 1 Mg Tabs (Lorazepam) .... One by mouth two times a day as needed anxiety must last one month between refills. 7)  Ibuprofen 600 Mg Tabs (Ibuprofen) .... One by mouth q6h as needed pain.  take with food 8)  Mirtazapine 30 Mg Tabs (Mirtazapine) .... One by mouth at bedtime Prescriptions: MIRTAZAPINE 30 MG TABS (MIRTAZAPINE) one by mouth at bedtime  #30 x 3   Entered and Authorized by:   Doralee Albino MD   Signed by:   Doralee Albino MD on 06/08/2010   Method used:   Electronically to        [facility] [phone]* (retail)       [address]       [place], [place]  [address]       Ph: [phone] or [phone]       Fax: [phone]   RxID:   [id number]    Orders Added: 1)  [facility]- Est Level  3 [[address]]

## 2010-06-30 NOTE — Assessment & Plan Note (Signed)
Summary: h/fup,tcb   Vital Signs:  Patient profile:   55 year old male Height:      66.75 inches Weight:      178 pounds Temp:     97.6 degrees F oral Pulse rate:   114 / minute Pulse (ortho):   104 / minute BP sitting:   122 / 86  (right arm) BP standing:   125 / 82 Cuff size:   regular  Vitals Entered ByTessie Fass CMA (January 11, 2010 10:22 AM)  Serial Vital Signs/Assessments:  Time      Position  BP       Pulse  Resp  Temp     By 11:10 AM  Lying RA  129/89   108                   Thekla Slade CMA, 11:10 AM  Sitting   125/87   108                   Thekla Slade CMA, 11:10 AM  Standing  125/82   104                   Thekla Slade CMA,  CC: hospital f/u Is Patient Diabetic? No Pain Assessment Patient in pain? no        Primary Care Provider:  Doralee Albino MD  CC:  hospital f/u.  History of Present Illness: 55 yo here for hospital follow-up for syncopal episode.  Was in hospital 01/05/10-01/06/10 for syncopal episode associated with ataxia, dysphasia.  Workup included CT head, MRI/MRA head/neck, EEG, ECHO which were all normal.  It was thought that this represented a TIA vs neurologic migraine.  No changes were made in patients meds.  Since discharge, has continued to have dizziness, but no presyncopeor weakness but notices sometimes his words are slurred at time. Continues to have daily headaches.  No nausea, photophobia, phonophobia.  Dizziness precipitated with rapid standing or exertion such as walking.  No SOB, Chest pain, or diaphoresis.  No dizziness when rolling over in bed.    Is somewhat confused about medicines  Takes ibuprofen three times a day for headaches, says he is out of imitrex.  He has been taking amytriptiline nightly.  Habits & Providers  Alcohol-Tobacco-Diet     Tobacco Status: quit  Current Medications (verified): 1)  Ibuprofen 600 Mg Tabs (Ibuprofen) .... Take 1 Tablet By Mouth Every Six Hours 2)  Budeprion Sr 150 Mg Xr12h-Tab  (Bupropion Hcl) .... One By Mouth Twice Daily 3)  Omeprazole 20 Mg Cpdr (Omeprazole) .... One By Mouth Two Times A Day 4)  Loratadine 10 Mg Tabs (Loratadine) .... One By Mouth Daily For Allergies 5)  Amitriptyline Hcl 75 Mg Tabs (Amitriptyline Hcl) .... One By Mouth At Bedtime (Both For Sleep and To Prevent Migraine Headaches.) 6)  Aspirin 81 Mg Tabs (Aspirin) .... One Tab By Mouth Daily 7)  Imitrex 50 Mg Tabs (Sumatriptan Succinate) .... One Tab By Mouth At The First Sign of Migraine Headache, May Take Another in 2h If No Improvement. 8)  Flexeril 10 Mg Tabs (Cyclobenzaprine Hcl) .... One By Mouth At Bedtime 9)  Gabapentin 600 Mg Tabs (Gabapentin) .... One By Mouth Three Times A Day 10)  Proventil Hfa 108 (90 Base) Mcg/act Aers (Albuterol Sulfate) .... 2 Puffs With A Spacer Every 6 Hours As Needed For Wheezing 11)  Pocket Spacer  Devi (Spacer/aero-Holding [place]) .... Use As Directed With Your Inhaler 12)  Ventolin Hfa 108 (90 Base) Mcg/act Aers (Albuterol Sulfate) .... 2 Puffs Q 6 Hours As Needed For Wheezing  Allergies: 1)  Metoprolol Succinate PMH-FH-SH reviewed-no changes except otherwise noted  Social History: Smoking Status:  quit  Review of Systems      See HPI  Physical Exam  General:  awake, alert, in no acute distress Head:  normocephalic and atraumatic.   Eyes:  pupils equal, pupils round, and pupils reactive to light.  EOMI Mouth:  pharynx pink and moist.   Lungs:  Normal respiratory effort, chest expands symmetrically. Lungs are clear to auscultation, no crackles or wheezes. Heart:  regular rhythm, no murmur, no gallop, no rub, and tachycardia.   Neurologic:  alert & oriented X3, cranial nerves II-XII intact, strength normal in all extremities, sensation intact to light touch, and DTRs symmetrical and normal.  Finger to nose slow bilaterally.    DIX-HALLPIKE neg   Impression & Recommendations:  Problem # 1:  DIZZINESS, CHRONIC (ICD-780.4)  Reviewed hospital  discharge.  Not orthostatic today.  No evidence of vertigo.  Unclear etiology- felt by inpatient team to be migrainous most likely vs TIA- see below.  Orders: [facility]- Est Level  3 (14782)  Problem # 2:  HEADACHE, UNSPECIFIED (ICD-784.0) Was started on amytrptiline early august by PCP for migraine prophylaxis.  Continues to have chronic daily headache in setting of medication overuse.  Advised to discontinue ibuprofen, use flexeril no more than several times per week as needed and will increase amytriptiline to 150mg  for migraine prohpylaxis dose.  Will follow-up with PCP in 1-2 weeks to see if dizziness improves with control of headache or if worsens with additional anticholinergic effect.  His updated medication list for this problem includes:    Ibuprofen 600 Mg Tabs (Ibuprofen) .Marland Kitchen... Take 1 tablet by mouth every six hours    Aspirin 81 Mg Tabs (Aspirin) ..... One tab by mouth daily    Imitrex 50 Mg Tabs (Sumatriptan succinate) ..... One tab by mouth at the first sign of migraine headache, may take another in 2h if no improvement.  Complete Medication List: 1)  Ibuprofen 600 Mg Tabs (Ibuprofen) .... Take 1 tablet by mouth every six hours 2)  Budeprion Sr 150 Mg Xr12h-tab (Bupropion hcl) .... One by mouth twice daily 3)  Omeprazole 20 Mg Cpdr (Omeprazole) .... One by mouth two times a day 4)  Loratadine 10 Mg Tabs (Loratadine) .... One by mouth daily for allergies 5)  Amitriptyline Hcl 150 Mg Tabs (Amitriptyline hcl) .... Take one tablet at bedtime (for migraine prevention and sleep) 6)  Aspirin 81 Mg Tabs (Aspirin) .... One tab by mouth daily 7)  Imitrex 50 Mg Tabs (Sumatriptan succinate) .... One tab by mouth at the first sign of migraine headache, may take another in 2h if no improvement. 8)  Flexeril 10 Mg Tabs (Cyclobenzaprine hcl) .... One by mouth at bedtime 9)  Gabapentin 600 Mg Tabs (Gabapentin) .... One by mouth three times a day 10)  Proventil Hfa 108 (90 Base) Mcg/act Aers (Albuterol  sulfate) .... 2 puffs with a spacer every 6 hours as needed for wheezing 11)  Pocket Spacer Devi (Spacer/aero-holding chambers) .... Use as directed with your inhaler 12)  Ventolin Hfa 108 (90 Base) Mcg/act Aers (Albuterol sulfate) .... 2 puffs q 6 hours as needed for wheezing  Patient Instructions: 1)  Increase amytriptiline to 150 mg 2)  make follow-up appt with dr Leveda Anna in 1-2 weeks Prescriptions: AMITRIPTYLINE HCL 150 MG TABS (AMITRIPTYLINE HCL)  take one tablet at bedtime (for migraine prevention and sleep)  #30 x 0   Entered and Authorized by:   Delbert Harness MD   Signed by:   Delbert Harness MD on 01/11/2010   Method used:   Electronically to        [facility] [phone]* (retail)       [address]       [place], [place]  [address]       Ph: [phone] or [phone]       Fax: [phone]   RxID:   [id number]

## 2010-06-30 NOTE — Assessment & Plan Note (Signed)
Summary: Leg problems   Primary Care Provider:  Doralee Albino MD  CC:  leg and neck pain.  History of Present Illness: leg pain and neck pain. Both are chronic.  Leg is from old trauma. Neck is also chronic.  Has chronic low back pain and this is similar.  Worsened by certain activities, movements and positions.    Current Medications (verified): 1)  Ibuprofen 600 Mg Tabs (Ibuprofen) .... Take 1 Tablet By Mouth Every Six Hours 2)  Budeprion Sr 150 Mg Xr12h-Tab (Bupropion Hcl) .... One By Mouth Twice Daily 3)  Omeprazole 20 Mg Cpdr (Omeprazole) .... One By Mouth Two Times A Day 4)  Loratadine 10 Mg Tabs (Loratadine) .... One By Mouth Daily For Allergies 5)  Zolpidem Tartrate 10 Mg Tabs (Zolpidem Tartrate) .... One By Mouth At Bedtime 6)  Aspirin 81 Mg Tabs (Aspirin) .... One Tab By Mouth Daily 7)  Imitrex 50 Mg Tabs (Sumatriptan Succinate) .... One Tab By Mouth At The First Sign of Migraine Headache, May Take Another in 2h If No Improvement. 8)  Flexeril 10 Mg Tabs (Cyclobenzaprine Hcl) .... One By Mouth At Bedtime 9)  Gabapentin 300 Mg Caps (Gabapentin) .... One By Mouth Three Times A Day 10)  Proventil Hfa 108 (90 Base) Mcg/act Aers (Albuterol Sulfate) .... 2 Puffs With A Spacer Every 6 Hours As Needed For Wheezing 11)  Pocket Spacer  Devi (Spacer/aero-Holding [place]) .... Use As Directed With Your Inhaler 12)  Ventolin Hfa 108 (90 Base) Mcg/act Aers (Albuterol Sulfate) .... 2 Puffs Q 6 Hours As Needed For Wheezing  Allergies (verified): 1)  Metoprolol Succinate  Past History:  Past medical, surgical, family and social histories (including risk factors) reviewed, and no changes noted (except as noted below).  Past Medical History: Reviewed history from 05/10/2009 and no changes required. bilateral hydrocele 603.9 CT proven repeat hydrocoel surg 2/08 Hx SCCA lung, in remission since 2006, s/p cisplatin/etopiside/radiation.  Past Surgical History: Reviewed history from  07/24/2007 and no changes required. hydrocele repair - 10/27/2005, radiation Rx for lung ca - 01/27/2005  Family History: Reviewed history from 09/14/2006 and no changes required. Family History of Alcoholism/Addiction Family History Hypertension Family History Lung cancer  Social History: Reviewed history from 09/09/2009 and no changes required. previously incarcerated; recovering narcotic addict Smoked for years quit, then restarted. ( 09/09/09)  Physical Exam  General:  Well-developed,well-nourished,in no acute distress; alert,appropriate and cooperative throughout examination Neck:  Some limited ROM of neck due to muscle tightness and some muscle tenderness Extremities:  Leg shows deformity around ankle at site of old trauma.  Hypersensitive   Impression & Recommendations:  Problem # 1:  NECK PAIN (ICD-723.1)  and leg pain.  Will increase gabapentin since I believe both have neuropathic componant. His updated medication list for this problem includes:    Ibuprofen 600 Mg Tabs (Ibuprofen) .Marland Kitchen... Take 1 tablet by mouth every six hours    Aspirin 81 Mg Tabs (Aspirin) ..... One tab by mouth daily    Flexeril 10 Mg Tabs (Cyclobenzaprine hcl) ..... One by mouth at bedtime  Orders: [facility]- Est Level  3 ([address])  Complete Medication List: 1)  Ibuprofen 600 Mg Tabs (Ibuprofen) .... Take 1 tablet by mouth every six hours 2)  Budeprion Sr 150 Mg Xr12h-tab (Bupropion hcl) .... One by mouth twice daily 3)  Omeprazole 20 Mg Cpdr (Omeprazole) .... One by mouth two times a day 4)  Loratadine 10 Mg Tabs (Loratadine) .... One by mouth daily for allergies 5)  Zolpidem Tartrate 10 Mg Tabs (Zolpidem tartrate) .... One by mouth at bedtime 6)  Aspirin 81 Mg Tabs (Aspirin) .... One tab by mouth daily 7)  Imitrex 50 Mg Tabs (Sumatriptan succinate) .... One tab by mouth at the first sign of migraine headache, may take another in 2h if no improvement. 8)  Flexeril 10 Mg Tabs (Cyclobenzaprine hcl) .... One  by mouth at bedtime 9)  Gabapentin 300 Mg Caps (Gabapentin) .... One by mouth three times a day 10)  Proventil Hfa 108 (90 Base) Mcg/act Aers (Albuterol sulfate) .... 2 puffs with a spacer every 6 hours as needed for wheezing 11)  Pocket Spacer Devi (Spacer/aero-holding chambers) .... Use as directed with your inhaler 12)  Ventolin Hfa 108 (90 Base) Mcg/act Aers (Albuterol sulfate) .... 2 puffs q 6 hours as needed for wheezing Prescriptions: GABAPENTIN 300 MG CAPS (GABAPENTIN) one by mouth three times a day  #90 x 12   Entered and Authorized by:   Doralee Albino MD   Signed by:   Doralee Albino MD on 11/12/2009   Method used:   Electronically to        [facility] [phone]* (retail)       [address]       [place], [place]  [address]       Ph: [phone] or [phone]       Fax: [phone]   RxID:   [id number]

## 2010-06-30 NOTE — Consult Note (Signed)
[facility]: [facility]   Imported By: De Nurse 06/08/2009 10:57:26  _____________________________________________________________________  External Attachment:    Type:   Image     Comment:   External Document

## 2010-06-30 NOTE — Assessment & Plan Note (Signed)
Summary: READ PPD/KH   Nurse Visit   Allergies: 1)  Metoprolol Succinate  PPD Results    Date of reading: 09/13/2009    Results: 0 mm    Interpretation: negative  Orders Added: 1)  No Charge Patient Arrived (NCPA0) [NCPA0]

## 2010-06-30 NOTE — Miscellaneous (Signed)
Summary: Cancel derm referral   Clinical Lists Changes Cancelled 12/7 derm referral.  He has been seen multiple times since and dishydrotic eczema has not been an issue.  Will reenter referral if needed. Doralee Albino MD  August 17, 2009 11:32 AM

## 2010-07-08 ENCOUNTER — Telehealth: Payer: Self-pay | Admitting: Family Medicine

## 2010-07-08 NOTE — Telephone Encounter (Signed)
Spoke with pt and he is trying to quit smoking and would like for you to write an rx for welbutrin and the 21 mg patch.  ~T

## 2010-07-08 NOTE — Telephone Encounter (Signed)
I would prefer that he come in to pharmacology clinic, Dr. Raymondo Band, to be certain this is the best smoking cessation plan.  Please arrange appointment

## 2010-07-11 ENCOUNTER — Telehealth: Payer: Self-pay | Admitting: *Deleted

## 2010-07-11 NOTE — Telephone Encounter (Signed)
Pt was informed to make appt with Dr. Raymondo Band for smoking cessation, per Dr. Leveda Anna.  Pt agreed

## 2010-07-11 NOTE — Telephone Encounter (Signed)
Called and informed pt that you would like for him to be seen by Dr. Raymondo Band and to call back to make an appt for this. Pt agreed and will make appt for this.

## 2010-07-12 ENCOUNTER — Ambulatory Visit (INDEPENDENT_AMBULATORY_CARE_PROVIDER_SITE_OTHER): Payer: Medicare Other | Admitting: Family Medicine

## 2010-07-12 VITALS — BP 132/68 | HR 106 | Wt 184.7 lb

## 2010-07-12 DIAGNOSIS — F17200 Nicotine dependence, unspecified, uncomplicated: Secondary | ICD-10-CM

## 2010-07-12 MED ORDER — NICOTINE 21 MG/24HR TD PT24: 1.0000 | MEDICATED_PATCH | TRANSDERMAL | Status: AC

## 2010-07-12 MED ORDER — BUPROPION HCL ER (XL) 150 MG PO TB24: 150.0000 mg | ORAL_TABLET | ORAL | Status: DC

## 2010-07-12 NOTE — Patient Instructions (Addendum)
Take buproprion 150mg  once daily in the morning for one week.  Then increase to two daily in the morning.  Start Nicotine 21mg  patch on your quit day.   Place on once daily.  Quit Date on 2/15 (Tomorrow) Return to Pharmacy Clinic in 3-4 weeks.

## 2010-07-12 NOTE — Progress Notes (Signed)
  Subjective:    Patient ID: [name], male    DOB: 07/07/1955, 55 y.o.   MRN: [id number]  HPI  Subjective:     [name] is a 55 y.o. male here for a discussion regarding smoking cessation. He began smoking at age 38. He currently smokes 1 packs per day. He has attempted to quit smoking in the past, most recently 2 years  ago. Best success quitting using buproprion and nicotine patch. Barriers to quitting include: anxiety.    Review of Systems   Objective:         Tobacco Use/Cessation.  I assessed [name] to be in  an action stage with respect to tobacco use.    :     Review of Systems        Physical Exam        Assessment & Plan  Moderate nicotine abuse of 44 year duration in a patient who is an excellent candidate for success b/c of eagerness to quit today and given lung cancer history. He has also been sober for nearly 5 years and attends alcoholics anonymous regularly. He smokes 1 PPD but feels that he can stop completely beginning tomorrow morning if he has assistance with medications. He reports frequent nighttime awakenings and difficulty falling asleep; when he does wake up he smokes 5-6 cigarettes during the night. He smokes immediately upon awakening in the morning. He has quit twice in the past, the longest duration being 8 months, the most recent attempt being 1-2 years ago. He was previously able to quit with the help of buproprion and nicotine patches. He reports n/v with Chantix. Will begin buproprion 150 mg XL once daily in the morning for one week, then increase to 300 mg QAM for the rest of the month. Also begin nicotine 21 mg patch daily. He is to return to pharmacy clinic in one month for f/u to determine if he is being successful with this plan.  Patient seen today with Guillermina City, PharmD candidate.  Total time in counseling 35 minutes.

## 2010-07-13 ENCOUNTER — Encounter: Payer: Self-pay | Admitting: Family Medicine

## 2010-07-13 NOTE — Telephone Encounter (Signed)
This encounter was created in error - please disregard.

## 2010-07-24 ENCOUNTER — Other Ambulatory Visit: Payer: Self-pay | Admitting: Family Medicine

## 2010-07-24 NOTE — Telephone Encounter (Signed)
Refill request

## 2010-08-09 ENCOUNTER — Ambulatory Visit (INDEPENDENT_AMBULATORY_CARE_PROVIDER_SITE_OTHER): Payer: Medicare Other | Admitting: Pharmacist

## 2010-08-09 ENCOUNTER — Encounter: Payer: Self-pay | Admitting: Pharmacist

## 2010-08-09 VITALS — BP 139/87 | HR 92 | Wt 184.7 lb

## 2010-08-09 DIAGNOSIS — F17200 Nicotine dependence, unspecified, uncomplicated: Secondary | ICD-10-CM

## 2010-08-09 MED ORDER — BUPROPION HCL ER (XL) 300 MG PO TB24: ORAL_TABLET | ORAL | Status: DC

## 2010-08-09 NOTE — Progress Notes (Signed)
  Subjective:    Patient ID: [name], male    DOB: 1955-12-24, 55 y.o.   MRN: [id number]  HPI Patient reports quitting tobacco for 3 weeks.   Currently using 300mg  daily of bupropion and 21mg  nicotine patch.   Continues to attend AA meetings for support 7 days per week.  His mother is supportive of his quitting.   Using hard candy for oral cravings.   The cravings he has last , 5 minutes.   His most difficult trigger at this time is first thing in the AM while drinking coffee.    Review of Systems     Objective:   Physical Exam        Assessment & Plan:

## 2010-08-09 NOTE — Progress Notes (Signed)
  Subjective:    Patient ID: [name], male    DOB: 1955/09/23, 55 y.o.   MRN: [id number]  HPI Reviewed and agree. Cydnee Fuquay  Review of Systems     Objective:   Physical Exam        Assessment & Plan:

## 2010-08-09 NOTE — Patient Instructions (Addendum)
1) GREAT job with quitting smoking! 2) Continue bupropion AND 21mg  nicotine patches for another 4 weeks.  3) Follow up with Pharmacist Clinic in 4 weeks.

## 2010-08-09 NOTE — Assessment & Plan Note (Addendum)
A:  Previous Moderate-Severe Nicotine abuse AND lung cancer who has now abstained from smoking for 3 weeks with use of bupropion and nicotine. Patient appears to be a good candidate for long term success with cessation due to previous quit attempts of > 6 months, current level of motivation and current high level of support.  P:  Continue nicotine 21 patch and bupropion 300mg  XL once daily in the AM. New prescription for 300mg  dose provided.   Patient verbalized understanding of treatment plan and will return to Rx clinic in 4 weeks.   TTFFC: 35 minutes.  Patient seen with Tammy Sours, PharmD and Orlan Leavens, PharmD candidate.

## 2010-08-10 ENCOUNTER — Other Ambulatory Visit: Payer: Self-pay | Admitting: Family Medicine

## 2010-08-10 NOTE — Telephone Encounter (Signed)
Refill request

## 2010-08-11 LAB — CBC
Hemoglobin: 13.3 g/dL (ref 13.0–17.0)
MCH: 32.3 pg (ref 26.0–34.0)
MCV: 93.4 fL (ref 78.0–100.0)
Platelets: 277 10*3/uL (ref 150–400)
RBC: 4.12 MIL/uL — ABNORMAL LOW (ref 4.22–5.81)

## 2010-08-11 LAB — BASIC METABOLIC PANEL
CO2: 28 mEq/L (ref 19–32)
Chloride: 102 mEq/L (ref 96–112)
Creatinine, Ser: 1.06 mg/dL (ref 0.4–1.5)
GFR calc Af Amer: >60 mL/min (ref >60)
Glucose, Bld: 92 mg/dL (ref 70–99)

## 2010-08-11 LAB — POCT I-STAT, CHEM 8
BUN: 14 mg/dL (ref 6–23)
Creatinine, Ser: 1 mg/dL (ref 0.4–1.5)
Glucose, Bld: 118 mg/dL — ABNORMAL HIGH (ref 70–99)
Sodium: 138 mEq/L (ref 135–145)
TCO2: 29 mmol/L (ref 0–100)

## 2010-08-12 LAB — CBC
HCT: 37.1 % — ABNORMAL LOW (ref 39.0–52.0)
HCT: 39.2 % (ref 39.0–52.0)
Hemoglobin: 12.8 g/dL — ABNORMAL LOW (ref 13.0–17.0)
Hemoglobin: 13.8 g/dL (ref 13.0–17.0)
MCH: 32.1 pg (ref 26.0–34.0)
MCH: 32.4 pg (ref 26.0–34.0)
MCHC: 35.2 g/dL (ref 30.0–36.0)
MCV: 93 fL (ref 78.0–100.0)
RBC: 3.99 MIL/uL — ABNORMAL LOW (ref 4.22–5.81)

## 2010-08-12 LAB — CARDIAC PANEL(CRET KIN+CKTOT+MB+TROPI)
Relative Index: INVALID (ref 0.0–2.5)
Relative Index: INVALID (ref 0.0–2.5)
Total CK: 73 U/L (ref 7–232)
Troponin I: 0.01 ng/mL (ref 0.00–0.06)

## 2010-08-12 LAB — COMPREHENSIVE METABOLIC PANEL
ALT: 52 U/L (ref 0–53)
Alkaline Phosphatase: 85 U/L (ref 39–117)
Alkaline Phosphatase: 94 U/L (ref 39–117)
BUN: 6 mg/dL (ref 6–23)
CO2: 27 mEq/L (ref 19–32)
Chloride: 105 mEq/L (ref 96–112)
Creatinine, Ser: 0.77 mg/dL (ref 0.4–1.5)
GFR calc non Af Amer: >60 mL/min (ref >60)
Glucose, Bld: 114 mg/dL — ABNORMAL HIGH (ref 70–99)
Glucose, Bld: 93 mg/dL (ref 70–99)
Potassium: 4.3 mEq/L (ref 3.5–5.1)
Sodium: 139 mEq/L (ref 135–145)
Total Bilirubin: 0.8 mg/dL (ref 0.3–1.2)
Total Protein: 7.3 g/dL (ref 6.0–8.3)

## 2010-08-12 LAB — POCT CARDIAC MARKERS: Troponin i, poc: <0.05 ng/mL (ref 0.00–0.09)

## 2010-08-12 LAB — DIFFERENTIAL
Basophils Relative: 0 % (ref 0–1)
Eosinophils Absolute: 0.1 10*3/uL (ref 0.0–0.7)
Monocytes Absolute: 0.6 10*3/uL (ref 0.1–1.0)
Monocytes Relative: 12 % (ref 3–12)

## 2010-08-12 LAB — BASIC METABOLIC PANEL
CO2: 23 mEq/L (ref 19–32)
Glucose, Bld: 112 mg/dL — ABNORMAL HIGH (ref 70–99)
Potassium: 4 mEq/L (ref 3.5–5.1)
Sodium: 135 mEq/L (ref 135–145)

## 2010-08-12 LAB — RAPID URINE DRUG SCREEN, HOSP PERFORMED
Barbiturates: NOT DETECTED
Benzodiazepines: NOT DETECTED
Cocaine: NOT DETECTED

## 2010-08-12 LAB — HEMOGLOBIN A1C
Hgb A1c MFr Bld: 5.8 % — ABNORMAL HIGH (ref <5.7)
Mean Plasma Glucose: 117 mg/dL — ABNORMAL HIGH (ref <117)

## 2010-08-12 LAB — CK TOTAL AND CKMB (NOT AT ARMC): Total CK: 91 U/L (ref 7–232)

## 2010-08-12 LAB — MRSA PCR SCREENING: MRSA by PCR: NEGATIVE

## 2010-08-12 LAB — TSH: TSH: 0.623 u[IU]/mL (ref 0.350–4.500)

## 2010-08-12 LAB — LIPID PANEL: Cholesterol: 152 mg/dL (ref 0–200)

## 2010-08-14 LAB — GLUCOSE, CAPILLARY: Glucose-Capillary: 132 mg/dL — ABNORMAL HIGH (ref 70–99)

## 2010-08-24 ENCOUNTER — Ambulatory Visit (INDEPENDENT_AMBULATORY_CARE_PROVIDER_SITE_OTHER): Payer: Medicare Other | Admitting: Family Medicine

## 2010-08-24 ENCOUNTER — Encounter: Payer: Self-pay | Admitting: Family Medicine

## 2010-08-24 DIAGNOSIS — R042 Hemoptysis: Secondary | ICD-10-CM | POA: Insufficient documentation

## 2010-08-24 LAB — CBC
HCT: 50.8 % (ref 39.0–52.0)
Hemoglobin: 17 g/dL (ref 13.0–17.0)
MCV: 95.8 fL (ref 78.0–100.0)
RBC: 5.3 MIL/uL (ref 4.22–5.81)
WBC: 7.1 10*3/uL (ref 4.0–10.5)

## 2010-08-25 LAB — COMPREHENSIVE METABOLIC PANEL
AST: 44 U/L — ABNORMAL HIGH (ref 0–37)
Albumin: 4.9 g/dL (ref 3.5–5.2)
BUN: 19 mg/dL (ref 6–23)
CO2: 24 mEq/L (ref 19–32)
Calcium: 10.7 mg/dL — ABNORMAL HIGH (ref 8.4–10.5)
Chloride: 100 mEq/L (ref 96–112)
Creat: 1.17 mg/dL (ref 0.40–1.50)
Glucose, Bld: 81 mg/dL (ref 70–99)
Potassium: 5.2 mEq/L (ref 3.5–5.3)

## 2010-08-25 NOTE — Assessment & Plan Note (Signed)
Very worrisome for recurrent cancer.  Will vigorously pursue testing.

## 2010-08-25 NOTE — Progress Notes (Signed)
  Subjective:    Patient ID: [name], male    DOB: [birth year], 55 y.o.   MRN: [id number]  HPI Bryann is 5 years out from his diagnosis of lung cancer.  He has had two months of hemoptosis, almost daily.  No fever.  Cough is only with hemoptosis.  He is pretty certain that blood is coming from chest and not post nasal drip.  No other symptoms    Review of Systems Denies fever, chest pain shortness of breath.  No other bleeding.  No weight loss.  Not smoking now.     Objective:   Physical Exam Lungs clear.       Assessment & Plan:

## 2010-08-29 ENCOUNTER — Encounter (HOSPITAL_COMMUNITY): Payer: Self-pay

## 2010-08-29 ENCOUNTER — Telehealth: Payer: Self-pay | Admitting: Family Medicine

## 2010-08-29 ENCOUNTER — Ambulatory Visit (HOSPITAL_COMMUNITY)
Admission: RE | Admit: 2010-08-29 | Discharge: 2010-08-29 | Disposition: A | Discharge status: Home / Self Care | Payer: Medicare Other | Source: Ambulatory Visit | Attending: Family Medicine | Admitting: Family Medicine

## 2010-08-29 DIAGNOSIS — R0602 Shortness of breath: Secondary | ICD-10-CM | POA: Insufficient documentation

## 2010-08-29 DIAGNOSIS — Z85118 Personal history of other malignant neoplasm of bronchus and lung: Secondary | ICD-10-CM | POA: Insufficient documentation

## 2010-08-29 DIAGNOSIS — R911 Solitary pulmonary nodule: Secondary | ICD-10-CM | POA: Insufficient documentation

## 2010-08-29 HISTORY — DX: Personal history of other malignant neoplasm of bronchus and lung: Z85.118

## 2010-08-29 MED ORDER — IOHEXOL 300 MG/ML  SOLN
80.0000 mL | Freq: Once | INTRAMUSCULAR | Status: AC | PRN
  Administered 2010-08-29: 80 mL via INTRAVENOUS

## 2010-08-29 NOTE — Telephone Encounter (Signed)
Called with results of CT (normal) and bloodwork (elevated calcium and protein.)  He is having less bleeding now that he has stopped the aspirin.  Now only gets blood in the morning when he blows his nose.  He wonders if bleeding has been from an upper respiratory source all along.  Will recheck in two weeks and decide if needs repeat CMP, serum protein electrophoresis and/or bone scan.

## 2010-09-06 ENCOUNTER — Ambulatory Visit (INDEPENDENT_AMBULATORY_CARE_PROVIDER_SITE_OTHER): Payer: Medicare Other | Admitting: Pharmacist

## 2010-09-06 ENCOUNTER — Encounter: Payer: Self-pay | Admitting: Pharmacist

## 2010-09-06 DIAGNOSIS — F17200 Nicotine dependence, unspecified, uncomplicated: Secondary | ICD-10-CM

## 2010-09-06 NOTE — Progress Notes (Signed)
  Subjective:    Patient ID: [name], male    DOB: Sep 20, 1955, 55 y.o.   MRN: [id number]  HPI Read and agree with Dr. Macky Lower management.   Review of Systems     Objective:   Physical Exam        Assessment & Plan:

## 2010-09-06 NOTE — Assessment & Plan Note (Addendum)
History of mod-sev nicotine dependence who has successfully stopped smoking for about 2 months after starting wellbutrin and the nicotine patch with occasional slips due to stress/anxiety. Motivated to stop smoking and to avoid slips. Not ready to taper nicotine patch due to slips.  P: Continue 21mg  nicotine patch and wellbutrin and re-evaluate in 2-4 weeks.  Total time with patient: 30 minutes Seen with Clance Boll, PharmD and Ivery Quale, PharmD Candidate

## 2010-09-06 NOTE — Patient Instructions (Addendum)
1) Continue nicotine patch 21mg . 2) Continue wellbutrin XL 300mg  1 tab in the morning. 3) Return to pharmacy clinic in 2 to 4 weeks.

## 2010-09-06 NOTE — Progress Notes (Signed)
  Subjective:    Patient ID: [name], male    DOB: [birth year], 55 y.o.   MRN: [id number]  HPI Patient arrives in good spirits.  States "I am NOT going to lie, I have smoked a few cigarettes".  He reports smoking when feeling stressed (avg 2-3x/week).  Arguments with his brother, who currently lives with the patient, appears to be the primary trigger.  Patient reports reading, watching movies, listening to music, and walking helps calm cravings, as well as avoiding environments that stimulate cravings (i.e. Playing pool, being around smokers). Patient feels confident he is able to quit completely. He reports being compliant with wellbutrin and nicotine patch with no adverse side effects. He does not feel ready to taper nicotine patch dose today. He currently does not have any cigarettes.  Patient reports coughing up blood ~3wks ago (purple in color), which has decreased. However, he reports coughing up phlegm and has blood residue with his nasal discharge.  Review of Systems     Objective:   Physical Exam        Assessment & Plan:

## 2010-09-09 ENCOUNTER — Encounter: Payer: Self-pay | Admitting: Family Medicine

## 2010-09-09 ENCOUNTER — Ambulatory Visit (INDEPENDENT_AMBULATORY_CARE_PROVIDER_SITE_OTHER): Payer: Medicare Other | Admitting: Family Medicine

## 2010-09-09 VITALS — BP 132/85 | HR 108 | Temp 98.1°F | Ht 66.0 in | Wt 182.8 lb

## 2010-09-09 DIAGNOSIS — J309 Allergic rhinitis, unspecified: Secondary | ICD-10-CM

## 2010-09-09 DIAGNOSIS — M542 Cervicalgia: Secondary | ICD-10-CM

## 2010-09-09 MED ORDER — FLUTICASONE PROPIONATE 50 MCG/ACT NA SUSP: 1.0000 | Freq: Every day | NASAL | Status: DC

## 2010-09-09 MED ORDER — CYCLOBENZAPRINE HCL 10 MG PO TABS: 10.0000 mg | ORAL_TABLET | Freq: Two times a day (BID) | ORAL | Status: DC | PRN

## 2010-09-09 NOTE — Assessment & Plan Note (Signed)
Recheck mild elevation

## 2010-09-09 NOTE — Progress Notes (Signed)
  Subjective:    Patient ID: [name], male    DOB: Feb 12, 1956, 55 y.o.   MRN: [id number]  HPI  Hemoptosis has resolved. Is having nasal congestion and bloody nose in the morning.  History of springtime allergies. Also worsening neck problems.      Review of Systems     Objective:   Physical Exam TMs OK Boggy nasal mucosa Neck spasm of Lt supraspinatis.Mild limited ROM Normal upper ext DTRx       Assessment & Plan:

## 2010-09-09 NOTE — Assessment & Plan Note (Signed)
Increase cyclobenzaprene

## 2010-09-09 NOTE — Assessment & Plan Note (Signed)
Nasal steroid

## 2010-09-10 LAB — COMPLETE METABOLIC PANEL WITH GFR
ALT: 41 U/L (ref 0–53)
AST: 33 U/L (ref 0–37)
CO2: 25 mEq/L (ref 19–32)
Calcium: 9.4 mg/dL (ref 8.4–10.5)
Chloride: 103 mEq/L (ref 96–112)
Creat: 0.89 mg/dL (ref 0.40–1.50)
GFR, Est African American: >60 mL/min (ref >60)
Sodium: 140 mEq/L (ref 135–145)
Total Protein: 7 g/dL (ref 6.0–8.3)

## 2010-09-26 ENCOUNTER — Other Ambulatory Visit: Payer: Self-pay | Admitting: Family Medicine

## 2010-09-26 NOTE — Telephone Encounter (Signed)
Refill request

## 2010-09-30 ENCOUNTER — Ambulatory Visit: Payer: Medicare Other | Admitting: Pharmacist

## 2010-10-04 ENCOUNTER — Encounter: Payer: Self-pay | Admitting: Pharmacist

## 2010-10-04 ENCOUNTER — Ambulatory Visit (INDEPENDENT_AMBULATORY_CARE_PROVIDER_SITE_OTHER): Payer: Medicare Other | Admitting: Pharmacist

## 2010-10-04 DIAGNOSIS — F17200 Nicotine dependence, unspecified, uncomplicated: Secondary | ICD-10-CM

## 2010-10-04 NOTE — Progress Notes (Signed)
  Subjective:    Patient ID: [name], male    DOB: [birth year], 55 y.o.   MRN: [id number]  HPI Agree with Dr. Macky Lower management. (Leston Schueller)    Review of Systems     Objective:   Physical Exam        Assessment & Plan:

## 2010-10-04 NOTE — Patient Instructions (Signed)
Continue good work with quitting smoking! Continue Wellbutrin XL 300 mg once daily in the morning. Follow-up with Dr. Leveda Anna, schedule today. Make follow-up appointment with Rx clinic in 6-8 weeks.

## 2010-10-04 NOTE — Assessment & Plan Note (Signed)
Tobacco Abuse:  A:  Hx of nicotine abuse, quit X 2 weeks.  Tolerating bupropion.   Appears excellent candidate for long-term success.    P: Continue current tx plan.  Encouraged continued abstinence from tobacco. Patient will follow up with Dr. Leveda Anna later this month (will make appt today) and will plan to follow up with Rx Clinic in 6-8 weeks for continued support. TTFFC: 20 minutes. Seen with Harley Hallmark, PharmD candidate and Georgina Pillion, PharmD resident.

## 2010-10-04 NOTE — Progress Notes (Signed)
  Subjective:    Patient ID: [name], male    DOB: [birth year], 55 y.o.   MRN: [id number]  HPI Patient arrives in good spirits.  Patient denies smoking for ~ 2 weeks.   He states he still thinks about cigarettes at times however he feels like he is doing well.   Denies any adverse effects from bupropion. Living environment continues to stressful with Sister, Brother and Mother.  Music is one thing he finds relaxing.     Hx of tobacco cessation of 6 months in the past.        Review of Systems     Objective:   Physical Exam        Assessment & Plan:  Tobacco Abuse:  A:  Hx of nicotine abuse, quit X 2 weeks.  Tolerating bupropion.   Appears excellent candidate for long-term success.    P: Continue current tx plan.  Encouraged continued abstinence from tobacco. Patient will follow up with Dr. Leveda Anna later this month (will make appt today) and will plan to follow up with Rx Clinic in 6-8 weeks for continued support. TTFFC: 20 minutes. Seen with Harley Hallmark, PharmD candidate and Georgina Pillion, PharmD resident.

## 2010-10-10 ENCOUNTER — Other Ambulatory Visit: Payer: Self-pay | Admitting: Family Medicine

## 2010-10-10 NOTE — Telephone Encounter (Signed)
Refill request

## 2010-10-14 ENCOUNTER — Encounter: Payer: Self-pay | Admitting: Family Medicine

## 2010-10-14 ENCOUNTER — Ambulatory Visit (INDEPENDENT_AMBULATORY_CARE_PROVIDER_SITE_OTHER): Payer: Medicare Other | Admitting: Family Medicine

## 2010-10-14 ENCOUNTER — Ambulatory Visit (HOSPITAL_COMMUNITY)
Admission: RE | Admit: 2010-10-14 | Discharge: 2010-10-14 | Disposition: A | Discharge status: Home / Self Care | Payer: Medicare Other | Source: Ambulatory Visit | Attending: Family Medicine | Admitting: Family Medicine

## 2010-10-14 ENCOUNTER — Other Ambulatory Visit: Payer: Self-pay

## 2010-10-14 DIAGNOSIS — M542 Cervicalgia: Secondary | ICD-10-CM

## 2010-10-14 DIAGNOSIS — R079 Chest pain, unspecified: Secondary | ICD-10-CM

## 2010-10-14 DIAGNOSIS — R0789 Other chest pain: Secondary | ICD-10-CM | POA: Insufficient documentation

## 2010-10-14 MED ORDER — IBUPROFEN 800 MG PO TABS: 800.0000 mg | ORAL_TABLET | Freq: Three times a day (TID) | ORAL | Status: DC | PRN

## 2010-10-14 MED ORDER — CYCLOBENZAPRINE HCL 10 MG PO TABS: 10.0000 mg | ORAL_TABLET | Freq: Three times a day (TID) | ORAL | Status: DC | PRN

## 2010-10-14 NOTE — Op Note (Signed)
[name], BRODERSEN            ACCOUNT NO.:  [id number]   MEDICAL RECORD NO.:  [id number]          PATIENT TYPE:  AMB   LOCATION:  DAY                          FACILITY:  [facility]   PHYSICIAN:  Claudette Laws, M.D.  DATE OF BIRTH:  17-Dec-1955   DATE OF PROCEDURE:  10/26/2005  DATE OF DISCHARGE:                                 OPERATIVE REPORT   PREOPERATIVE DIAGNOSIS:  Bilateral spermatocele.   POSTOPERATIVE DIAGNOSIS:  Bilateral spermatocele, cystic epididymis  bilaterally.   PROCEDURE:  Scrotal exploration with bilateral spermatocelectomy.   SURGEON:  Claudette Laws, M.D.   ASSISTANT:  Terie Purser, M.D.   ANESTHESIA:  General endotracheal.   COMPLICATIONS:  None.   DRAINS:  None.   ESTIMATED BLOOD LOSS:  Minimal.   DISPOSITION:  Stable to post anesthesia care unit.   INDICATIONS FOR PROCEDURE:  Mr. Ureta is a 55 year old gentleman who has  a history of bilateral spermatoceles. These have been symptomatic. He  requests intervention. The benefits and risks of the above procedure were  explained, his consent was obtained.   DESCRIPTION OF PROCEDURE:  The patient was brought to the operating room and  properly identified. A timeout was performed to confirm correct patient,  procedure and side. He was administered general anesthesia, given  preoperative antibiotics, placed in the supine position on the operating  table and prepped and draped in a sterile fashion. We then proceeded to make  a midline scrotal incision and dissection was carried down bluntly and with  the bovie cautery through the dartos and subcutaneous tissues. We began by  addressing the right side spermatocele. Dissection was carried down through  the left hemiscrotum and the testicle and surrounding tissues were  delivered. He did have a large multiloculated spermatocele and cystic  epididymis. We took down all of the overlying tissue and exposed these  cystic structures. Care was taken to avoid  injury to the vas or cord  structures. Because of the multicystic nature of the lesions, they were  unable to all be individually excised. There was a large component of this  thought to be the primary spermatocele located at the distal aspect of the  epididymis. This was opened and drained. A significant portion of the  spermatocele sac was excised and sent to pathology. The edges were then  oversewn with a 3-0 Vicryl suture. There was one other cystic structure that  was opened and oversewn in a similar fashion. We elected to leave the  smaller cystic structures in the primary aspect of the epididymis.  Hemostasis was obtained and was excellent at this point in the case. We then  proceeded to the contralateral side. Again dissection was carried through to  the right hemiscrotum and the testicle and surrounding cystic structures  were delivered. This appeared to be the same process. There was  multiloculated cystic structures comprising the epididymis as well as the  distal aspect of this compromising the spermatocele. The spermatocele was  opened in a similar fashion and the sac was excised and the edges were  oversewn. Again we decided to leave the epididymis as this  would have  compromised the testicle with excision. Care was taken to avoid injury to  the vas and cord structures during this procedure. Hemostasis was obtained  and it was excellent. A cord block consisting of 0.25% Marcaine was placed  on the right and left side. The testicle was then replaced in the right  hemiscrotum. We then closed the dartos incisions with a 3-0 Vicryl suture.  Dartos layer was closed in a second layer using a running 3-0 Vicryl suture.  The scrotal skin was closed in a running fashion with a 4-0 chromic suture.  Collodion was placed over the incision. A scrotal support with fluff gauze  was placed. There were no complications in the case. The patient was then  awoken from anesthesia and transported  to the recovery room in stable  condition. Please note that Dr. Etta Grandchild was present and participated in all  aspects of this procedure as he was the primary operating surgeon.     ______________________________  Terie Purser, MD      Claudette Laws, M.D.  Electronically Signed    JH/MEDQ  D:  10/26/2005  T:  10/27/2005  Job:  [id number]

## 2010-10-14 NOTE — Op Note (Signed)
NAMESAYAN, [name]            ACCOUNT NO.:  [id number]   MEDICAL RECORD NO.:  [id number]          PATIENT TYPE:  AMB   LOCATION:  CARD                         FACILITY:  [facility]   PHYSICIAN:  Casimiro Needle B. Sherene Sires, M.D. [facility] OF BIRTH:  [birth year]   DATE OF PROCEDURE:  08/15/2004  DATE OF DISCHARGE:                                 OPERATIVE REPORT   PROCEDURE:  Fiberoptic bronchoscopy with Wang biopsy of the carina and right  upper lobe orifice.   SURGEON:  Charlaine Dalton. Sherene Sires, M.D.   HISTORY:  A 55 year old white male with a history of smoking referred by  Dineen Kid. Reche Dixon, M.D. for evaluation of a right hilar mass.  The patient  agreed to the procedure after a full discussion or risks, benefits, and  alternatives.   He received a total of 25 mg of IV Demerol and 5 mg of IV Versed for  adequate sedation and cough suppression.   DESCRIPTION OF PROCEDURE:  Using a standard flexible fiberoptic  bronchoscope, the right naris was easily cannulated with good visualization  of the entire oropharynx and larynx.  The cords moved normally, and there  were no apparent upper airway lesions.   Using additional 1% lidocaine, the entire tracheobronchial tree was explored  bilaterally with the following findings:  1.  All the airways were normal except for the carina and right upper lobe      orifice.  2.  The carina just distal to the origin of the right mainstem bronchus      revealed heaped up swollen tissue that extended down into the area      divided between the right upper lobe and the bronchus intermedius but      did not obstruct any of the airways to any significant degree.  This      mucosal abnormality appeared to extend along the medial and anterior      walls.   Therefore, Wang biopsies x4 were done within the area of the heaped up  tissue as the process appeared to be extrabronchial.  There was minimal  bleeding controlled with lavage.   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   RECOMMENDATION:  Await tissue diagnosis and referral to radiation/oncology.      MBW/MEDQ  D:  08/15/2004  T:  08/15/2004  Job:  [id number]   cc:   Dineen Kid. Reche Dixon, M.D.  [address] [place]  [place] [address]  Fax: [phone]

## 2010-10-14 NOTE — Op Note (Signed)
[name], [name]            ACCOUNT NO.:  [id number]   MEDICAL RECORD NO.:  [id number]          PATIENT TYPE:  AMB   LOCATION:  N[facility]C                         FACILITY:  [facility]   PHYSICIAN:  Mark C. Vernie Ammons, M.D.  DATE OF BIRTH:  March 03, 1956   DATE OF PROCEDURE:  07/09/2006  DATE OF DISCHARGE:                               OPERATIVE REPORT   PREOPERATIVE DIAGNOSIS:  Bilateral painful epididymal cyst.   POSTOPERATIVE DIAGNOSIS:  Bilateral painful epididymal cyst.   PROCEDURE:  Scrotal exploration and bilateral epididymectomy.   SURGEON:  Dr. Vernie Ammons   ANESTHESIA:  General.   BLOOD LOSS:  Minimal.   DRAINS:  None.   SPECIMENS:  Right and left epididymis to pathology.   COMPLICATIONS:  None.   INDICATIONS:  The patient is 55 year old white male who had previously  undergone bilateral spermatocelectomy on Oct 26, 2005.  He had a lot of  postprocedural discomfort and continues to have pain bilaterally with  recurrent testicular masses noted bilaterally in the area of the  epididymis.  The risks and complications of reexploration with the  greater risk of testicular atrophy was discussed with the patient.  Despite this and because of his pain, he has elected to proceed with re-  exploration and excision of the entire epididymis on each side.   DESCRIPTION OF OPERATION:  After informed consent, the patient brought  to the major OR, placed on table, administered general anesthesia, then  moved to the dorsal lithotomy position.  Genitalia was sterilely prepped  and draped, and a midline median raphe scrotal incision was then made.  The left testicle was addressed first.  There was complete obliteration  of the space surrounding the testicle.  It was completely encased in  scar.  Using a combination of sharp and blunt technique, I was able to  dissect the testicle down to nearly the capsule and noted a cystic mass  posteriorly.  I initially began my dissection at the top of  the  epididymis near the head.  I dissected this away from the cord and then  down to the testicle.  I then dissected along each side in the sulcus  between the epididymis and testicle down to near the tail of the  epididymis.  Finally, with tension being applied to the epididymis and  testicle, I used a sharp technique to excise the epididymis from the  testicle.  The area of the rete testes was identified and fulgurated.  Bleeding points were cauterized as they were identified.  I then  dissected the tail of the epididymis free with meticulous attempt to  maintain the blood supply to the testicle.  The vas was identified,  ligated with 0 Vicryl suture, and divided and the epididymis excised and  sent to pathology.  Further bleeding points were cauterized, and the  testicle was then placed back in its normal anatomic position in the  left hemiscrotum.   I then explored the right hemiscrotum and found a similar to slightly  less amount of scar.  The dissection was undertaken in identical  fashion.  I was able to be more easily  dissect the epididymis from the  testicle and again with meticulous attention to the spermatic cord and  blood supply entering into the lower pole of the testicle, I dissected  the epididymis from this, although there was a great deal of scar.  I  ligated the vas again and then freed the epididymis and sent this to  pathology as well.   The testicles were observed and appeared to remain pink, indicating a  good blood supply.  There was also some bleeding from the testicles in  the area of the rete testes that was fulgurated, also indicating  persistent blood supply to the testicle.  I therefore replaced the right  testicle in the right hemiscrotum, reobserved the left testicle and  noted no bleeding.   I then closed the incision by first reapproximating the deep scrotal  tissue on the right and left sides and included the midline septum as  well in a running  locking fashion using 2-0 Vicryl.  I then injected  0.25% plain Marcaine subcu and around each testicle in the potential  space around the testicles and then closed the skin with running 3-0  chromic suture.  Neosporin, fluffed Kerlix, and a scrotal support was  then applied, and the patient was awakened, taken to recovery room in  stable satisfactory condition.  He tolerated the procedure well with no  intraoperative complications.   He will be given a prescription for Tylox 1-2 q.4 h p.r.n. pain #38 and  Keflex 500 mg b.i.d. #10 with followup in my office in approximately 4  weeks and sooner if he should have any difficulty.  He was given written  instructions upon discharge.      Mark C. Vernie Ammons, M.D.  Electronically Signed     MCO/MEDQ  D:  07/09/2006  T:  07/09/2006  Job:  [id number]

## 2010-10-14 NOTE — Progress Notes (Signed)
  Subjective:    Patient ID: [name], male    DOB: 1956/01/10, 55 y.o.   MRN: [id number]  HPI C/O worsening neck pain bilaterally Lt>Rt. Had episode of chest pain lasting ~ 2 hours 3 days ago while neck was hurting. No pain since.  No nausea, vomiting or diaphoresis.  Pain was sharp and worsened with neck movement.   Review of Systems     Objective:   Physical Exam    Neck, spasm of supraspinatous and trapezius. Cardiac RRR without murmur or g Lungs clear    Assessment & Plan:

## 2010-10-14 NOTE — Assessment & Plan Note (Signed)
EKG OK.  Seems related to neck pain,  Seems musculoskeletal
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
## 2010-10-14 NOTE — Assessment & Plan Note (Signed)
Worsened, increase flexeril.  Burst of ibuprofen.  I don't want him taking long term, rather episodically when neck flairs.

## 2010-10-20 ENCOUNTER — Other Ambulatory Visit: Payer: Self-pay | Admitting: Family Medicine

## 2010-10-20 NOTE — Telephone Encounter (Signed)
Refill request

## 2010-10-26 ENCOUNTER — Encounter: Payer: Self-pay | Admitting: Family Medicine

## 2010-10-26 ENCOUNTER — Ambulatory Visit: Payer: Medicare Other | Admitting: Family Medicine

## 2010-10-26 ENCOUNTER — Ambulatory Visit (INDEPENDENT_AMBULATORY_CARE_PROVIDER_SITE_OTHER): Payer: Medicare Other | Admitting: Family Medicine

## 2010-10-26 VITALS — BP 118/81 | HR 120 | Temp 98.3°F | Ht 66.0 in | Wt 188.8 lb

## 2010-10-26 DIAGNOSIS — M542 Cervicalgia: Secondary | ICD-10-CM

## 2010-10-26 MED ORDER — GABAPENTIN 300 MG PO CAPS: 300.0000 mg | ORAL_CAPSULE | Freq: Three times a day (TID) | ORAL | Status: DC

## 2010-10-26 NOTE — Patient Instructions (Signed)
Start your new prescription, gabapentin, with just one pill at night.  If no side effects, increase to twice a day.  Wait another 3 days and if still no side effects, increase to three times per day.

## 2010-10-26 NOTE — Progress Notes (Signed)
  Subjective:    Patient ID: [name], male    DOB: 01/22/1956, 55 y.o.   MRN: [id number]  HPI Bilateral neck and shoulder pain, not well relieved by current meds.  No symptoms of numbness or weakness.  No acute decline or trauma.  Just a gradual worsening.    Review of Systems     Objective:   Physical Exam  Normal strength, sensation and reflexes of both upper extremities.  Spasm of supraspinatus, trapezius and neck extensors.        Assessment & Plan:

## 2010-10-27 NOTE — Assessment & Plan Note (Signed)
Now more a chronic pain syndrome.  Will do trial of neuropathic pain meds

## 2010-11-10 ENCOUNTER — Other Ambulatory Visit: Payer: Self-pay | Admitting: Family Medicine

## 2010-11-10 NOTE — Telephone Encounter (Signed)
Refill request

## 2010-11-15 ENCOUNTER — Ambulatory Visit: Payer: Medicare Other | Admitting: Pharmacist

## 2010-11-18 ENCOUNTER — Encounter: Payer: Self-pay | Admitting: Family Medicine

## 2010-11-18 ENCOUNTER — Ambulatory Visit (INDEPENDENT_AMBULATORY_CARE_PROVIDER_SITE_OTHER): Payer: Medicare Other | Admitting: Family Medicine

## 2010-11-18 DIAGNOSIS — R4789 Other speech disturbances: Secondary | ICD-10-CM

## 2010-11-18 DIAGNOSIS — R479 Unspecified speech disturbances: Secondary | ICD-10-CM

## 2010-11-18 DIAGNOSIS — R51 Headache: Secondary | ICD-10-CM

## 2010-11-18 DIAGNOSIS — R42 Dizziness and giddiness: Secondary | ICD-10-CM

## 2010-11-18 MED ORDER — OMEPRAZOLE 20 MG PO CPDR: 20.0000 mg | DELAYED_RELEASE_CAPSULE | Freq: Every day | ORAL | Status: DC

## 2010-11-18 MED ORDER — LORAZEPAM 1 MG PO TABS: 1.0000 mg | ORAL_TABLET | Freq: Four times a day (QID) | ORAL | Status: DC | PRN

## 2010-11-18 NOTE — Progress Notes (Signed)
  Subjective:    Patient ID: [name], male    DOB: 1956-03-05, 55 y.o.   MRN: [id number]  HPI 1. Dizziness Patient states that for the last 3 weeks he has had dizziness (room spinning) and losing his balance. He does not have low BP or BP meds. His orthostatics in house today were normal.  He says that lying down makes the dizziness go away. It is not constant dizziness. It is associated with no nystagmus, no positional worsening. He has associated slurred speech, but this is longer in chronicity. No hearing changes.  2. Headache His headaches are periodic and relieved by ibuprofen. He has experienced numbness with these headaches sounding like migraine type. No HTN, no medications that cause headache. He is still smoking. Neurological examination normal. Neck nontender. Afebrile. No visual changes.  3. Insomnia Takes Lorazepam to sleep, but has had continue insomnia for several days.   Review of Systems  Constitutional: Positive for fatigue. Negative for fever, activity change and unexpected weight change.  HENT: Negative for hearing loss, ear pain, neck pain, neck stiffness and tinnitus.   Eyes: Negative for pain and visual disturbance.  Cardiovascular: Negative for chest pain and palpitations.  Gastrointestinal: Negative for nausea, vomiting, abdominal pain, diarrhea and constipation.  Skin: Negative for rash.  Neurological: Positive for dizziness, tremors, speech difficulty, numbness and headaches. Negative for seizures, syncope, facial asymmetry, weakness and light-headedness.       Objective:   Physical Exam  Constitutional: He is oriented to person, place, and time. No distress.  HENT:  Head: Normocephalic and atraumatic.  Eyes: Conjunctivae and EOM are normal. Pupils are equal, round, and reactive to light.  Neck: Normal range of motion. Neck supple.  Cardiovascular: Normal rate and regular rhythm.   No murmur heard. Abdominal: Soft. He exhibits no  distension. There is no tenderness.  Neurological: He is alert and oriented to person, place, and time. He has normal reflexes. He displays normal reflexes. No cranial nerve deficit or sensory deficit. He exhibits normal muscle tone. Coordination normal. GCS eye subscore is 4. GCS verbal subscore is 5. GCS motor subscore is 6.  Skin: No rash noted.  Psychiatric: He has a normal mood and affect. His behavior is normal.        Assessment & Plan:  1. Dizziness Patient states that for the last 3 weeks he has had dizziness (room spinning) and losing his balance. He does not have low BP or BP meds. His orthostatics in house today were normal.  He says that lying down makes the dizziness go away. It is not constant dizziness. It is associated with no nystagmus, no positional worsening. He has associated slurred speech, but this is longer in chronicity.  2. Headache His headaches are periodic and relieved by ibuprofen. He has experienced numbness with these headaches sounding like migraine type. No HTN, no medications that cause headache. He is still smoking.  3. Insomnia Takes Lorazepam to sleep, but has had continue insomnia for several days.  - Because of the patients above complaints and history of Lung CA/Tobacco abuse I cannot rule out Intracranial pathology such as CVA, Metastasis.  - Will Obtain MRI of the brain without contrast.

## 2010-11-23 ENCOUNTER — Telehealth: Payer: Self-pay | Admitting: Family Medicine

## 2010-11-23 ENCOUNTER — Other Ambulatory Visit: Payer: Self-pay | Admitting: Family Medicine

## 2010-11-23 ENCOUNTER — Ambulatory Visit (HOSPITAL_COMMUNITY)
Admission: RE | Admit: 2010-11-23 | Discharge: 2010-11-23 | Disposition: A | Discharge status: Home / Self Care | Payer: Medicare Other | Source: Ambulatory Visit | Attending: Family Medicine | Admitting: Family Medicine

## 2010-11-23 DIAGNOSIS — R42 Dizziness and giddiness: Secondary | ICD-10-CM | POA: Insufficient documentation

## 2010-11-23 DIAGNOSIS — R51 Headache: Secondary | ICD-10-CM

## 2010-11-23 DIAGNOSIS — R279 Unspecified lack of coordination: Secondary | ICD-10-CM | POA: Insufficient documentation

## 2010-11-23 DIAGNOSIS — I619 Nontraumatic intracerebral hemorrhage, unspecified: Secondary | ICD-10-CM | POA: Insufficient documentation

## 2010-11-23 DIAGNOSIS — R4789 Other speech disturbances: Secondary | ICD-10-CM | POA: Insufficient documentation

## 2010-11-23 DIAGNOSIS — Z8673 Personal history of transient ischemic attack (TIA), and cerebral infarction without residual deficits: Secondary | ICD-10-CM | POA: Insufficient documentation

## 2010-11-23 DIAGNOSIS — R479 Unspecified speech disturbances: Secondary | ICD-10-CM

## 2010-11-23 MED ORDER — GADOBENATE DIMEGLUMINE 529 MG/ML IV SOLN
15.0000 mL | Freq: Once | INTRAVENOUS | Status: AC
  Administered 2010-11-23: 15 mL via INTRAVENOUS

## 2010-11-23 NOTE — Telephone Encounter (Signed)
Spoke with patient on the phone. 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 Also informed him that these do not appear acute and that they are likely older.  Patient understood information. Advised patient to stop smoking immediately to decrease likelihood of recurrent strokes. He understood and said he would make an effort to quit.

## 2010-11-23 NOTE — Telephone Encounter (Signed)
Message copied by Edd Arbour on Wed Nov 23, 2010  6:22 PM ------      Message from: Barbaraann Barthel      Created: Wed Nov 23, 2010  2:38 PM       I was called by Dr. Margo Aye to discuss results of MRI brain, which shows no evidence of metastatic disease or hemorrhage.  Will forward to the ordering physician, Dr. Rivka Safer, to notify patient and for follow up.       BREEN,JAMES O

## 2010-11-26 ENCOUNTER — Other Ambulatory Visit: Payer: Self-pay | Admitting: Family Medicine

## 2010-11-26 DIAGNOSIS — IMO0001 Reserved for inherently not codable concepts without codable children: Secondary | ICD-10-CM

## 2010-12-06 ENCOUNTER — Ambulatory Visit (INDEPENDENT_AMBULATORY_CARE_PROVIDER_SITE_OTHER): Payer: Medicare Other | Admitting: Pharmacist

## 2010-12-06 ENCOUNTER — Encounter: Payer: Self-pay | Admitting: Pharmacist

## 2010-12-06 VITALS — BP 130/92 | HR 112 | Ht 67.0 in | Wt 178.0 lb

## 2010-12-06 DIAGNOSIS — F17200 Nicotine dependence, unspecified, uncomplicated: Secondary | ICD-10-CM

## 2010-12-06 NOTE — Progress Notes (Signed)
  Subjective:    Patient ID: [name], male    DOB: Sep 22, 1955, 54 y.o.   MRN: [id number]  HPI  Patient arrives in usual spirits.  Patient appears clean shaven and well groomed.  He admits to restarting smoking about 1 month ago at that time he smoked as much as 1/2 ppd of camels.  Last week he received results from a head scan that he states showed multiple mini-strokes.  He said they did NOT know when these strokes occurred.  He does NOT feel any different symptoms lately.  He believes these mini-strokes are probably something that happened in the past.  After this news patient restarted 21mg  nicotine patch on 7/4 and has been smoke free since that time.   He states he is more motivated to quit at this time.  He continues to receive support from his AA group which he attends daily.   Review of Systems     Objective:   Physical Exam        Assessment & Plan:  Tobacco Abuse: Patient with long-standing nicotine addiction who recently experienced a relapse of about 2 week duration who has quit with use of 21mg  nicotine patch and bupropion 300mg  XL daily for the last 6 days.  He was encouraged to continue his current regimen and encouraged to remain smoke free.  Patient will follow up with Dr. Leveda Anna in the next month and then follow up in the Rx Clinic in 6 weeks.  TTFFC 25 minutes.  Seen today with Waneta Martins, Psychology intern.   Recent report of mini-strokes - Follow up with Dr. Leveda Anna - consider use of low dose aspirin at next visit.

## 2010-12-06 NOTE — Patient Instructions (Signed)
Continue your great work on staying quit from tobacco! Please stay quit for the next 6 weeks.  Follow up with Dr. Leveda Anna at next scheduled visit.  Please schedule to return to [facility] in 6 weeks.

## 2010-12-06 NOTE — Progress Notes (Signed)
  Subjective:    Patient ID: [name], male    DOB: [birth year], 55 y.o.   MRN: [id number]  HPI Reviewed and agree with Dr. Macky Lower management.    Review of Systems     Objective:   Physical Exam        Assessment & Plan:

## 2010-12-06 NOTE — Assessment & Plan Note (Signed)
Patient with long-standing nicotine addiction who recently experienced a relapse of about 2 week duration who has quit with use of 21mg  nicotine patch and bupropion 300mg  XL daily for the last 6 days.  He was encouraged to continue his current regimen and encouraged to remain smoke free.  Patient will follow up with Dr. Leveda Anna in the next month and then follow up in the Rx Clinic in 6 weeks.  TTFFC 25 minutes.  Seen today with Waneta Martins, Psychology intern.

## 2010-12-10 ENCOUNTER — Ambulatory Visit (INDEPENDENT_AMBULATORY_CARE_PROVIDER_SITE_OTHER): Payer: Medicare Other

## 2010-12-10 ENCOUNTER — Inpatient Hospital Stay (INDEPENDENT_AMBULATORY_CARE_PROVIDER_SITE_OTHER)
Admission: RE | Admit: 2010-12-10 | Discharge: 2010-12-10 | Disposition: A | Discharge status: Home / Self Care | Payer: Medicare Other | Source: Ambulatory Visit | Attending: Emergency Medicine | Admitting: Emergency Medicine

## 2010-12-10 DIAGNOSIS — S239XXA Sprain of unspecified parts of thorax, initial encounter: Secondary | ICD-10-CM

## 2010-12-10 DIAGNOSIS — S139XXA Sprain of joints and ligaments of unspecified parts of neck, initial encounter: Secondary | ICD-10-CM

## 2010-12-28 ENCOUNTER — Ambulatory Visit (INDEPENDENT_AMBULATORY_CARE_PROVIDER_SITE_OTHER): Payer: Medicare Other | Admitting: Family Medicine

## 2010-12-28 ENCOUNTER — Encounter: Payer: Self-pay | Admitting: Family Medicine

## 2010-12-28 DIAGNOSIS — Z8673 Personal history of transient ischemic attack (TIA), and cerebral infarction without residual deficits: Secondary | ICD-10-CM | POA: Insufficient documentation

## 2010-12-28 DIAGNOSIS — E785 Hyperlipidemia, unspecified: Secondary | ICD-10-CM | POA: Insufficient documentation

## 2010-12-28 DIAGNOSIS — I10 Essential (primary) hypertension: Secondary | ICD-10-CM | POA: Insufficient documentation

## 2010-12-28 DIAGNOSIS — E7800 Pure hypercholesterolemia, unspecified: Secondary | ICD-10-CM

## 2010-12-28 DIAGNOSIS — I6350 Cerebral infarction due to unspecified occlusion or stenosis of unspecified cerebral artery: Secondary | ICD-10-CM

## 2010-12-28 DIAGNOSIS — F17200 Nicotine dependence, unspecified, uncomplicated: Secondary | ICD-10-CM

## 2010-12-28 DIAGNOSIS — I639 Cerebral infarction, unspecified: Secondary | ICD-10-CM

## 2010-12-28 HISTORY — DX: Essential (primary) hypertension: I10

## 2010-12-28 MED ORDER — SIMVASTATIN 10 MG PO TABS: 10.0000 mg | ORAL_TABLET | Freq: Every evening | ORAL | Status: DC

## 2010-12-28 MED ORDER — HYDROCHLOROTHIAZIDE 12.5 MG PO TABS: 12.5000 mg | ORAL_TABLET | Freq: Every day | ORAL | Status: DC

## 2010-12-28 NOTE — Patient Instructions (Signed)
You have two new medicines, one for high blood pressure and one for high cholesterol You have had at least two small strokes.  You must quit smoking if you wish to live. See me in two weeks to make sure your blood pressure is coming down. Stay away from salty foods.

## 2010-12-29 NOTE — Assessment & Plan Note (Signed)
Discussed risks factors we'll increase control of high cholesterol and blood pressure. He understands that smoking cessation is vital.

## 2010-12-29 NOTE — Progress Notes (Signed)
  Subjective:    Patient ID: [name], male    DOB: [birth year], 55 y.o.   MRN: [id number]  HPI [name] had a spell of confusion and was seen in the family [facility] by Dr. Mauricio Po. MRI of the brain was ordered mostly concerned about metastatic disease in this person with known cancer. MRI showed 2 small lacunar infarcts. Risk factors for coronary artery disease as and cerebrovascular disease are continues of smoking, borderline high blood pressure, low HDL and normal LDL, and family history of coronary artery disease.    Review of Systems     Objective:   Physical Exam  Neuro intact HEENT normal lungs clear neck no bruits cardiac regular rate and rhythm without murmur or gallop      Assessment & Plan:

## 2010-12-29 NOTE — Assessment & Plan Note (Signed)
Currently using patch and has not smoked for 24 hours strongly encouraged continued smoking cessation

## 2010-12-29 NOTE — Assessment & Plan Note (Signed)
History of CVA increases need to control blood pressures which have been borderline high. Start medications today

## 2010-12-29 NOTE — Assessment & Plan Note (Signed)
With history of CVA we'll increase rigor of cholesterol treatment even though he has a low HDL problem Will start with low-dose statin because of good data of decreasing strokes. Will need recheck of LDL and HDL in 3 months.

## 2011-01-17 ENCOUNTER — Encounter: Payer: Self-pay | Admitting: Pharmacist

## 2011-01-17 ENCOUNTER — Ambulatory Visit (INDEPENDENT_AMBULATORY_CARE_PROVIDER_SITE_OTHER): Payer: Medicare Other | Admitting: Pharmacist

## 2011-01-17 VITALS — BP 104/80 | HR 108 | Resp 27 | Ht 68.0 in | Wt 177.0 lb

## 2011-01-17 DIAGNOSIS — I639 Cerebral infarction, unspecified: Secondary | ICD-10-CM

## 2011-01-17 DIAGNOSIS — F17200 Nicotine dependence, unspecified, uncomplicated: Secondary | ICD-10-CM

## 2011-01-17 DIAGNOSIS — I6350 Cerebral infarction due to unspecified occlusion or stenosis of unspecified cerebral artery: Secondary | ICD-10-CM

## 2011-01-17 DIAGNOSIS — E7800 Pure hypercholesterolemia, unspecified: Secondary | ICD-10-CM

## 2011-01-17 LAB — LIPID PANEL
HDL: 36 mg/dL — ABNORMAL LOW (ref >39)
LDL Cholesterol: 92 mg/dL (ref 0–99)
Total CHOL/HDL Ratio: 4.6 Ratio

## 2011-01-17 NOTE — Patient Instructions (Addendum)
Continue to stay tobacco free for 6 weeks until your next visit! Let Dr. Leveda Anna know if you continue to have dizzy spells and weakness.  Next visit in 2-3 weeks with Dr. Leveda Anna.

## 2011-01-17 NOTE — Progress Notes (Signed)
  Subjective:    Patient ID: [name], male    DOB: 10-May-1956, 55 y.o.   MRN: [id number]  HPI  I saw this patient with Dr. Raymondo Band because I am his continuity physician and because of the worrisome nature of his symptoms of acute dizziness (this morning, now resolved.)  Recent MRI shows lacunar infarcts, on in cerebellum.  Reviewed med list and he had not been taking aspirin.  Spells of dizziness have been brief and only occur on standing.  While I am tempted to call orthostatic hypotension, will focus on CVA possibilities.  He will begin ASA today, we will check lipid panel and he is diligent with smoking cessation.  I will see in 2-3 weeks, sooner if worse symptoms.    Review of Systems     Objective:   Physical Exam        Assessment & Plan:

## 2011-01-17 NOTE — Assessment & Plan Note (Signed)
Past smoker of many years duration in a patient who is fair candidate for success b/c of previous quit and increased motivation d/t MRI results.  Barriers to success include being surrounded by other smokers.  Initiated nicotine replacement tx 21mg  patch with bupropion XL 300mg  daily. Patient counseled on purpose, proper use, and potential adverse effects.  Encouraged continued quit attempt.  Dr. Leveda Anna wants patient to restart ASA 325mg  daily for stroke prevention and ordered a lipid panel and BMP today.  Written information provided.  F/U Rx Clinic Visit in 6 weeks. Total time with patient in face-to-face counseling 35 minutes.  Patient seen with Ike Bene, PharmD.

## 2011-01-17 NOTE — Progress Notes (Signed)
  Subjective:    Patient ID: [name], male    DOB: 04-22-1956, 55 y.o.   MRN: [id number]  HPI [name] presents to clinic today for smoking cessation f/u in good spirits.  He reports feeling dizzy since last evening when he couldn't see the tv well.  Today, his gait is ataxic and he says the room is spinning.  He had a recent MRI showing cerebellar lacunar infarcts, we consulted Dr. Leveda Anna to evaluate his symptoms.  The patient previously quit smoking back in February, but reports starting again (1/2 ppd) after the last visit with Dr. Raymondo Band.  He quit 2 days ago and continues to use bupropion and nicotine patch (21mg ).  Review of Systems As per HPI.    Objective:   Physical Exam Filed Vitals:   01/17/11 1009  BP: 104/80  Pulse: 108  Resp: 27      Assessment & Plan:  Past smoker of many years duration in a patient who is fair candidate for success b/c of previous quit and increased motivation d/t MRI results.  Barriers to success include being surrounded by other smokers.  Initiated nicotine replacement tx 21mg  patch with bupropion XL 300mg  daily. Patient counseled on purpose, proper use, and potential adverse effects.  Encouraged continued quit attempt.  Dr. Leveda Anna wants patient to restart ASA 325mg  daily for stroke prevention and ordered a lipid panel and BMP today.  Written information provided.  F/U Rx Clinic Visit in 6 weeks. Total time with patient in face-to-face counseling 35 minutes.  Patient seen with [name], PharmD.

## 2011-01-18 ENCOUNTER — Ambulatory Visit: Payer: Medicare Other | Admitting: Family Medicine

## 2011-01-18 LAB — BASIC METABOLIC PANEL WITH GFR
BUN: 14 mg/dL (ref 6–23)
Chloride: 99 mEq/L (ref 96–112)
Creat: 1.1 mg/dL (ref 0.50–1.35)
GFR, Est African American: >60 mL/min (ref >60)
Glucose, Bld: 119 mg/dL — ABNORMAL HIGH (ref 70–99)
Potassium: 4.4 mEq/L (ref 3.5–5.3)

## 2011-02-08 ENCOUNTER — Ambulatory Visit (INDEPENDENT_AMBULATORY_CARE_PROVIDER_SITE_OTHER): Payer: Medicare Other | Admitting: Family Medicine

## 2011-02-08 ENCOUNTER — Encounter: Payer: Self-pay | Admitting: Family Medicine

## 2011-02-08 DIAGNOSIS — K219 Gastro-esophageal reflux disease without esophagitis: Secondary | ICD-10-CM

## 2011-02-08 DIAGNOSIS — M545 Low back pain: Secondary | ICD-10-CM

## 2011-02-08 DIAGNOSIS — M542 Cervicalgia: Secondary | ICD-10-CM

## 2011-02-08 DIAGNOSIS — Z23 Encounter for immunization: Secondary | ICD-10-CM

## 2011-02-08 DIAGNOSIS — I10 Essential (primary) hypertension: Secondary | ICD-10-CM

## 2011-02-08 DIAGNOSIS — F17200 Nicotine dependence, unspecified, uncomplicated: Secondary | ICD-10-CM

## 2011-02-08 DIAGNOSIS — J449 Chronic obstructive pulmonary disease, unspecified: Secondary | ICD-10-CM

## 2011-02-08 MED ORDER — FLUTICASONE PROPIONATE HFA 220 MCG/ACT IN AERO: 2.0000 | INHALATION_SPRAY | Freq: Two times a day (BID) | RESPIRATORY_TRACT | Status: DC

## 2011-02-08 MED ORDER — IBUPROFEN 800 MG PO TABS: 800.0000 mg | ORAL_TABLET | Freq: Three times a day (TID) | ORAL | Status: DC | PRN

## 2011-02-08 MED ORDER — HYDROCHLOROTHIAZIDE 12.5 MG PO TABS: 12.5000 mg | ORAL_TABLET | Freq: Every day | ORAL | Status: DC

## 2011-02-08 NOTE — Patient Instructions (Addendum)
Quit smoking. New medicine for lungs - take every day.   Refills sent for blood pressure medicine and ibuprofen. Call me and I will arrange another CXR if your coughing or bleeding gets worse. See me in one month to check on things. Consider putting bricks or blocks under head of bed to help urping.

## 2011-02-10 NOTE — Assessment & Plan Note (Signed)
Educated about not lying down post eating.  Also suggested bed on blocks.

## 2011-02-10 NOTE — Assessment & Plan Note (Signed)
Well controled. 

## 2011-02-10 NOTE — Progress Notes (Signed)
  Subjective:    Patient ID: [name], male    DOB: 1956/03/07, 55 y.o.   MRN: [id number]  HPI  Doing well.No further neuro symptoms.  Does report several issues Cough occaisionally productive of blood tinged sputum.  SP lung cancer resection.  Most recent CXR was 7/12. Worsening GERD symptoms especially when laying down. Still smokes but has decreased to 1/2 ppd.    Review of Systems     Objective:   Physical Exam Lungs diffuse exp wheeze. Abd benign.        Assessment & Plan:

## 2011-02-10 NOTE — Assessment & Plan Note (Signed)
Reemphasized quit plan.

## 2011-02-10 NOTE — Assessment & Plan Note (Signed)
Feel pulm sx are more due to his COPD and doubt lung ca recurrence.  Add inhaled steroid.

## 2011-02-20 ENCOUNTER — Other Ambulatory Visit (HOSPITAL_COMMUNITY): Payer: Self-pay

## 2011-03-08 ENCOUNTER — Encounter: Payer: Self-pay | Admitting: Family Medicine

## 2011-03-08 ENCOUNTER — Ambulatory Visit (INDEPENDENT_AMBULATORY_CARE_PROVIDER_SITE_OTHER): Payer: Medicare Other | Admitting: Family Medicine

## 2011-03-08 VITALS — BP 102/79 | HR 103 | Temp 97.7°F | Ht 66.0 in | Wt 178.0 lb

## 2011-03-08 DIAGNOSIS — J449 Chronic obstructive pulmonary disease, unspecified: Secondary | ICD-10-CM

## 2011-03-08 MED ORDER — GUAIFENESIN-CODEINE 100-10 MG/5ML PO SYRP: 5.0000 mL | ORAL_SOLUTION | Freq: Every evening | ORAL | Status: DC | PRN

## 2011-03-08 MED ORDER — ALBUTEROL SULFATE HFA 108 (90 BASE) MCG/ACT IN AERS: 2.0000 | INHALATION_SPRAY | Freq: Four times a day (QID) | RESPIRATORY_TRACT | Status: DC | PRN

## 2011-03-08 MED ORDER — AZITHROMYCIN 500 MG PO TABS: 500.0000 mg | ORAL_TABLET | Freq: Every day | ORAL | Status: AC

## 2011-03-08 NOTE — Assessment & Plan Note (Signed)
Has COPD exacerbation.  Will treat with antibiotics.

## 2011-03-08 NOTE — Progress Notes (Signed)
  Subjective:    Patient ID: [name], male    DOB: 05/30/1955, 55 y.o.   MRN: [id number]  HPI  Has had five days of cough and wheezing.  Known COPD.  Previously asymptomatic and well controled by flovent.  Subjective low grade fever.  No blood in sputum    Review of Systems     Objective:   Physical Exam No rales, diffuse end exp wheeze.        Assessment & Plan:

## 2011-03-08 NOTE — Patient Instructions (Addendum)
Stop hydrochlorothiazide for now.  Don't throw away.  I may need to restart it when you are feeling better depending on what your blood pressure does.   Stay on all your other medications. Let me be clear about how to use your two inhalers. The flovent that you already have is used every day - even when you are not sick. The ProAir (albuterol) inhaler is new - and you only use it when you are wheezing or short of breath.

## 2011-03-15 ENCOUNTER — Ambulatory Visit (HOSPITAL_COMMUNITY)
Admission: RE | Admit: 2011-03-15 | Discharge: 2011-03-15 | Disposition: A | Discharge status: Home / Self Care | Payer: Medicare Other | Source: Ambulatory Visit | Attending: Internal Medicine | Admitting: Internal Medicine

## 2011-03-15 ENCOUNTER — Encounter (HOSPITAL_COMMUNITY): Payer: Self-pay

## 2011-03-15 DIAGNOSIS — R918 Other nonspecific abnormal finding of lung field: Secondary | ICD-10-CM | POA: Insufficient documentation

## 2011-03-15 DIAGNOSIS — C3490 Malignant neoplasm of unspecified part of unspecified bronchus or lung: Secondary | ICD-10-CM | POA: Insufficient documentation

## 2011-03-15 DIAGNOSIS — D3500 Benign neoplasm of unspecified adrenal gland: Secondary | ICD-10-CM | POA: Insufficient documentation

## 2011-03-15 MED ORDER — IOHEXOL 300 MG/ML  SOLN
80.0000 mL | Freq: Once | INTRAMUSCULAR | Status: AC | PRN
  Administered 2011-03-15: 80 mL via INTRAVENOUS

## 2011-03-20 ENCOUNTER — Other Ambulatory Visit: Payer: Self-pay | Admitting: Internal Medicine

## 2011-03-20 ENCOUNTER — Encounter (HOSPITAL_BASED_OUTPATIENT_CLINIC_OR_DEPARTMENT_OTHER): Payer: Medicare Other | Admitting: Internal Medicine

## 2011-03-20 DIAGNOSIS — C7931 Secondary malignant neoplasm of brain: Secondary | ICD-10-CM

## 2011-03-20 DIAGNOSIS — R942 Abnormal results of pulmonary function studies: Secondary | ICD-10-CM

## 2011-03-20 DIAGNOSIS — C7949 Secondary malignant neoplasm of other parts of nervous system: Secondary | ICD-10-CM

## 2011-03-20 DIAGNOSIS — Z85118 Personal history of other malignant neoplasm of bronchus and lung: Secondary | ICD-10-CM

## 2011-03-20 DIAGNOSIS — C3490 Malignant neoplasm of unspecified part of unspecified bronchus or lung: Secondary | ICD-10-CM

## 2011-03-20 LAB — CBC WITH DIFFERENTIAL/PLATELET
BASO%: 0.3 % (ref 0.0–2.0)
Basophils Absolute: 0 10*3/uL (ref 0.0–0.1)
Eosinophils Absolute: 0.1 10*3/uL (ref 0.0–0.5)
HCT: 43.9 % (ref 38.4–49.9)
HGB: 15.1 g/dL (ref 13.0–17.1)
LYMPH%: 27.9 % (ref 14.0–49.0)
MONO#: 0.5 10*3/uL (ref 0.1–0.9)
NEUT#: 4 10*3/uL (ref 1.5–6.5)
NEUT%: 62.2 % (ref 39.0–75.0)
Platelets: 289 10*3/uL (ref 140–400)
WBC: 6.5 10*3/uL (ref 4.0–10.3)
lymph#: 1.8 10*3/uL (ref 0.9–3.3)

## 2011-03-20 LAB — COMPREHENSIVE METABOLIC PANEL
ALT: 39 U/L (ref 0–53)
CO2: 27 mEq/L (ref 19–32)
Calcium: 10 mg/dL (ref 8.4–10.5)
Chloride: 99 mEq/L (ref 96–112)
Creatinine, Ser: 0.85 mg/dL (ref 0.50–1.35)
Glucose, Bld: 106 mg/dL — ABNORMAL HIGH (ref 70–99)
Total Bilirubin: 0.3 mg/dL (ref 0.3–1.2)

## 2011-03-22 ENCOUNTER — Telehealth: Payer: Self-pay | Admitting: Family Medicine

## 2011-03-22 NOTE — Telephone Encounter (Signed)
Here is the CT report which is not yet in Epic  CT Chest With Contrast - STATUS: Final  IMAGE                                     Perform Date: 17Oct12 13:42  Ordered By: Randa Evens Date: 17Oct12 12:31  Facility: [facility]                              Department: CT  Service Report Text   Accession Number: [id number]     Clinical Data: Follow-up lung cancer.    CT CHEST WITH CONTRAST    Technique:  Multidetector CT imaging of the chest was performed   following the standard protocol during bolus administration of   intravenous contrast.    Contrast: 80mL OMNIPAQUE IOHEXOL 300 MG/ML IV SOLN    Comparison: Chest CT 08/29/2010.    Findings: The chest wall is unremarkable and stable.  No   supraclavicular or axillary lymphadenopathy.  The bony thorax is   intact and appears stable.  No destructive bony lesions or spinal   canal compromise.    The heart is normal in size.  No pericardial effusion.  Stable   small epicardial lymph node. The esophagus is grossly normal.  The   aorta is normal in caliber.  Mild atherosclerotic changes.  No   dissection.  There are stable radiation changes involving the right   hilum and paramediastinal lung.  No findings to suggest recurrent   tumor.    There are a vague nodular opacities in the right upper lobe.  These   are likely inflammatory changes but require close follow-up.  The   left lung is clear and stable.  No pleural effusion.    The upper abdomen is unremarkable and stable.  Stable left adrenal   gland myelolipoma.    IMPRESSION:    1.  Stable radiation changes involving the right hilum and   paramediastinal lung.   2.  New small, vague, ill-defined airspace nodules in the right   upper lobe are likely inflammatory but recommend short-term follow-   up chest CT (3 months).    Original Report Authenticated By: P. Loralie Champagne, M.D.  Additional Information  HL7 RESULT STATUS : F  External image :  [id number]  External IF Update Timestamp : 2011-03-15:13:42:[id number]  Given that I just treated him on 10/10 for an infectious COPD exacerbation, I reassured [name] that the changes seen are most likely the lingering effects of that infection.  I emphasized that it is important for him to keep that follow up CT scan.

## 2011-03-22 NOTE — Telephone Encounter (Signed)
Spoke with pt and he stated that he had the CT done and was told that it was something "puffy and cloudy, and could not r/o CA. He will have another CT done in January but would like to speak with Dr. Leveda Anna about the findings on this CT. Forwarded to pcp.Loralee Pacas [place]

## 2011-03-22 NOTE — Telephone Encounter (Signed)
Pt went to have a CT of his lungs and he wants to speak to Hensel about the results - wants a second opinion

## 2011-05-02 ENCOUNTER — Other Ambulatory Visit: Payer: Self-pay | Admitting: *Deleted

## 2011-05-05 ENCOUNTER — Telehealth: Payer: Self-pay | Admitting: Internal Medicine

## 2011-05-05 NOTE — Telephone Encounter (Signed)
Talked to pt, gave him appt for lab/CT and MD a few days later. Instructed pt to be npo 4 hrs prior to scan

## 2011-05-19 ENCOUNTER — Ambulatory Visit (INDEPENDENT_AMBULATORY_CARE_PROVIDER_SITE_OTHER): Payer: Medicare Other | Admitting: Family Medicine

## 2011-05-19 ENCOUNTER — Encounter: Payer: Self-pay | Admitting: Family Medicine

## 2011-05-19 VITALS — BP 115/88 | HR 108 | Ht 66.0 in | Wt 179.9 lb

## 2011-05-19 DIAGNOSIS — G4700 Insomnia, unspecified: Secondary | ICD-10-CM

## 2011-05-19 DIAGNOSIS — J441 Chronic obstructive pulmonary disease with (acute) exacerbation: Secondary | ICD-10-CM | POA: Insufficient documentation

## 2011-05-19 MED ORDER — TEMAZEPAM 30 MG PO CAPS: 30.0000 mg | ORAL_CAPSULE | Freq: Every evening | ORAL | Status: AC | PRN

## 2011-05-19 MED ORDER — PREDNISONE 20 MG PO TABS: 20.0000 mg | ORAL_TABLET | Freq: Three times a day (TID) | ORAL | Status: AC

## 2011-05-19 MED ORDER — DOXYCYCLINE HYCLATE 100 MG PO TABS: 100.0000 mg | ORAL_TABLET | Freq: Two times a day (BID) | ORAL | Status: AC

## 2011-05-19 NOTE — Patient Instructions (Signed)
I sent two meds to the pharmacy for your cough, which is a worsening of your lung disease from smoking. QUIT SMOKING We will try a new sleeping medication.  Let me know how it works.  Remember that information that I have previously given you on sleep hygeine.

## 2011-05-19 NOTE — Assessment & Plan Note (Signed)
Try restoril.

## 2011-05-19 NOTE — Assessment & Plan Note (Signed)
Rx for flair

## 2011-05-19 NOTE — Progress Notes (Signed)
  Subjective:    Patient ID: [name], male    DOB: December 17, 1955, 55 y.o.   MRN: [id number]  HPI Two issues Insomnia is a chronic problem.  Tried on mult meds in the past without benefit.  (Ambien, trazodone and mirtazapine)  Now feels more urgent need to sleep and be sharp in the morning in that his mother has failing health and likely early dementia  Second, cough x3 weeks.  Unfortunately, started smoking again.  No fever.  Mild SOB    Review of Systems     Objective:   Physical Exam Lungs no tachypnea.  Bilateral exp wheezes.  No rales.       Assessment & Plan:

## 2011-06-08 ENCOUNTER — Ambulatory Visit: Payer: Medicare Other | Admitting: Internal Medicine

## 2011-06-15 ENCOUNTER — Other Ambulatory Visit (HOSPITAL_BASED_OUTPATIENT_CLINIC_OR_DEPARTMENT_OTHER): Payer: Medicare Other | Admitting: Lab

## 2011-06-15 ENCOUNTER — Ambulatory Visit (HOSPITAL_COMMUNITY)
Admission: RE | Admit: 2011-06-15 | Discharge: 2011-06-15 | Disposition: A | Discharge status: Home / Self Care | Payer: Medicare Other | Source: Ambulatory Visit | Attending: Internal Medicine | Admitting: Internal Medicine

## 2011-06-15 DIAGNOSIS — C3410 Malignant neoplasm of upper lobe, unspecified bronchus or lung: Secondary | ICD-10-CM

## 2011-06-15 DIAGNOSIS — R059 Cough, unspecified: Secondary | ICD-10-CM | POA: Diagnosis not present

## 2011-06-15 DIAGNOSIS — T66XXXA Radiation sickness, unspecified, initial encounter: Secondary | ICD-10-CM | POA: Diagnosis not present

## 2011-06-15 DIAGNOSIS — C3490 Malignant neoplasm of unspecified part of unspecified bronchus or lung: Secondary | ICD-10-CM | POA: Diagnosis not present

## 2011-06-15 DIAGNOSIS — Y842 Radiological procedure and radiotherapy as the cause of abnormal reaction of the patient, or of later complication, without mention of misadventure at the time of the procedure: Secondary | ICD-10-CM | POA: Insufficient documentation

## 2011-06-15 DIAGNOSIS — R0602 Shortness of breath: Secondary | ICD-10-CM | POA: Diagnosis not present

## 2011-06-15 DIAGNOSIS — R05 Cough: Secondary | ICD-10-CM | POA: Insufficient documentation

## 2011-06-15 LAB — CBC WITH DIFFERENTIAL/PLATELET
BASO%: 0.8 % (ref 0.0–2.0)
Eosinophils Absolute: 0.3 10*3/uL (ref 0.0–0.5)
HCT: 44 % (ref 38.4–49.9)
MCHC: 34.3 g/dL (ref 32.0–36.0)
MONO#: 0.4 10*3/uL (ref 0.1–0.9)
NEUT#: 2.8 10*3/uL (ref 1.5–6.5)
NEUT%: 49.6 % (ref 39.0–75.0)
Platelets: 244 10*3/uL (ref 140–400)
RBC: 4.65 10*6/uL (ref 4.20–5.82)
WBC: 5.6 10*3/uL (ref 4.0–10.3)
lymph#: 2.1 10*3/uL (ref 0.9–3.3)

## 2011-06-15 LAB — CMP (CANCER CENTER ONLY)
ALT(SGPT): 43 U/L (ref 10–47)
CO2: 31 mEq/L (ref 18–33)
Calcium: 9.7 mg/dL (ref 8.0–10.3)
Chloride: 98 mEq/L (ref 98–108)
Glucose, Bld: 59 mg/dL — ABNORMAL LOW (ref 73–118)
Sodium: 141 mEq/L (ref 128–145)
Total Protein: 7.3 g/dL (ref 6.4–8.1)

## 2011-06-15 MED ORDER — IOHEXOL 300 MG/ML  SOLN
80.0000 mL | Freq: Once | INTRAMUSCULAR | Status: AC | PRN
  Administered 2011-06-15: 80 mL via INTRAVENOUS

## 2011-06-19 ENCOUNTER — Ambulatory Visit (HOSPITAL_BASED_OUTPATIENT_CLINIC_OR_DEPARTMENT_OTHER): Payer: Medicare Other | Admitting: Internal Medicine

## 2011-06-19 ENCOUNTER — Telehealth: Payer: Self-pay | Admitting: Internal Medicine

## 2011-06-19 ENCOUNTER — Telehealth: Payer: Self-pay | Admitting: Family Medicine

## 2011-06-19 VITALS — BP 124/81 | HR 105 | Temp 97.5°F | Wt 177.7 lb

## 2011-06-19 DIAGNOSIS — G4700 Insomnia, unspecified: Secondary | ICD-10-CM

## 2011-06-19 DIAGNOSIS — Z85118 Personal history of other malignant neoplasm of bronchus and lung: Secondary | ICD-10-CM

## 2011-06-19 DIAGNOSIS — Z9221 Personal history of antineoplastic chemotherapy: Secondary | ICD-10-CM | POA: Diagnosis not present

## 2011-06-19 DIAGNOSIS — Z923 Personal history of irradiation: Secondary | ICD-10-CM | POA: Diagnosis not present

## 2011-06-19 DIAGNOSIS — C3490 Malignant neoplasm of unspecified part of unspecified bronchus or lung: Secondary | ICD-10-CM

## 2011-06-19 NOTE — Telephone Encounter (Signed)
[name] calling to say that the temazepam for sleep on a scale of 1-10 was #5.  Still had trouble going to sleep after taking med.  Also medicaid will not cover this.  Will need a different med that they will pay for as well as help go to sleep.  Please call [name] to inform of this.  If calling back today, call back around 4:00.  Have a 3:00 appt with Dr. Arbutus Ped at the [facility].

## 2011-06-19 NOTE — Telephone Encounter (Signed)
At pts request, mailed his jan2014 appts as well as his ct scan for 06/19/2012 ct scan [facility]

## 2011-06-19 NOTE — Progress Notes (Signed)
[facility] OFFICE PROGRESS NOTE  Sanjuana Letters, MD, MD [address] [place] [address]  PRINCIPAL DIAGNOSIS:  Limited stage small cell lung cancer diagnosed in March 2006.  PRIOR THERAPY:   1. Status post 4 cycles of systemic chemotherapy with cisplatin and etoposide concurrent with radiation during cycle 2 and 3.  Last dose of chemotherapy was given 11/08/2004. 2. Status post prophylactic cranial irradiation under the care of Dr. Dorna Bloom, completed February 24, 2005.  CURRENT THERAPY:  Observation.  INTERVAL HISTORY: [name] 56 y.o. male returns to the clinic today for rou no weight loss ortine followup visit. The patient has no complaints today. She denied having any significant chest pain or shortness of breath, no cough or hemoptysis. He has no weight loss or night sweats. The patient has repeat CT scan of the chest performed recently and he is here today for evaluation and discussion of his scan results .  MEDICAL HISTORY: Past Medical History  Diagnosis Date  . H/O: lung cancer right side  . Hypertension 12/28/2010  . lung ca dx;d 2006    chemo/xrt comp to 2006    ALLERGIES:  is allergic to lithium carbonate; metoprolol succinate; and wellbutrin.  MEDICATIONS:  Current Outpatient Prescriptions  Medication Sig Dispense Refill  . albuterol (PROAIR HFA) 108 (90 BASE) MCG/ACT inhaler Inhale 2 puffs into the lungs every 6 (six) hours as needed for wheezing.  1 Inhaler  4  . aspirin (BAYER ASPIRIN) 325 MG tablet Take 1 tablet (325 mg total) by mouth daily.      . cyclobenzaprine (FLEXERIL) 10 MG tablet Take 1 tablet (10 mg total) by mouth 3 (three) times daily as needed for muscle spasms.  90 tablet  12  . fluticasone (FLONASE) 50 MCG/ACT nasal spray 1 spray by Nasal route daily.  16 g  2  . fluticasone (FLOVENT HFA) 220 MCG/ACT inhaler Inhale 2 puffs into the lungs 2 (two) times daily.  1 Inhaler  12  . gabapentin (NEURONTIN) 300 MG capsule  Take 1 capsule (300 mg total) by mouth 3 (three) times daily.  90 capsule  2  . guaiFENesin-codeine (ROBITUSSIN AC) 100-10 MG/5ML syrup Take 5 mLs by mouth at bedtime as needed for cough.  120 mL  0  . hydrochlorothiazide (HYDRODIURIL) 12.5 MG tablet Take 1 tablet (12.5 mg total) by mouth daily. For high blood pressure  90 tablet  3  . ibuprofen (ADVIL,MOTRIN) 800 MG tablet Take 1 tablet (800 mg total) by mouth every 8 (eight) hours as needed for pain.  90 tablet  6  . LORazepam (ATIVAN) 1 MG tablet Take 1 tablet (1 mg total) by mouth every 6 (six) hours as needed for anxiety.  45 tablet  5  . nicotine (NICODERM CQ - DOSED IN MG/24 HOURS) 21 mg/24hr patch apply 1 patch once daily  28 patch  1  . omeprazole (PRILOSEC) 20 MG capsule Take 1 capsule (20 mg total) by mouth daily.  60 capsule  3  . simvastatin (ZOCOR) 10 MG tablet Take 1 tablet (10 mg total) by mouth every evening.  30 tablet  11  . temazepam (RESTORIL) 30 MG capsule Take 1 capsule (30 mg total) by mouth at bedtime as needed for sleep.  30 capsule  0    REVIEW OF SYSTEMS:  A comprehensive review of systems was negative.   PHYSICAL EXAMINATION: General appearance: alert, cooperative and no distress Resp: clear to auscultation bilaterally Cardio: regular rate and rhythm, S1, S2  normal, no murmur, click, rub or gallop GI: soft, non-tender; bowel sounds normal; no masses,  no organomegaly Extremities: extremities normal, atraumatic, no cyanosis or edema  ECOG PERFORMANCE STATUS: 0 - Asymptomatic  There were no vitals taken for this visit.  LABORATORY DATA: Lab Results  Component Value Date   WBC 5.6 06/15/2011   HGB 15.1 06/15/2011   HCT 44.0 06/15/2011   MCV 94.6 06/15/2011   PLT 244 06/15/2011      Chemistry      Component Value Date/Time   NA 141 06/15/2011 1338   NA 136 03/20/2011 1520   K 3.8 06/15/2011 1338   K 3.8 03/20/2011 1520   CL 98 06/15/2011 1338   CL 99 03/20/2011 1520   CO2 31 06/15/2011 1338   CO2 27  03/20/2011 1520   BUN 11 06/15/2011 1338   BUN 13 03/20/2011 1520   CREATININE 0.7 06/15/2011 1338   CREATININE 0.85 03/20/2011 1520      Component Value Date/Time   CALCIUM 9.7 06/15/2011 1338   CALCIUM 10.0 03/20/2011 1520   ALKPHOS 105* 06/15/2011 1338   ALKPHOS 115 03/20/2011 1520   AST 35 06/15/2011 1338   AST 33 03/20/2011 1520   ALT 39 03/20/2011 1520   BILITOT 0.50 06/15/2011 1338   BILITOT 0.3 03/20/2011 1520       RADIOGRAPHIC STUDIES: Ct Chest W Contrast  06/15/2011  *RADIOLOGY REPORT*  Clinical Data: Lung cancer.  Cough and shortness of breath.  CT CHEST WITH CONTRAST  Technique:  Multidetector CT imaging of the chest was performed following the standard protocol during bolus administration of intravenous contrast.  Contrast: 80mL OMNIPAQUE IOHEXOL 300 MG/ML IV SOLN  Comparison: 03/15/2011.  Findings: Mediastinal lymph nodes are not enlarged by CT size criteria.  No left hilar or axillary adenopathy.  Atherosclerotic calcification of the arterial vasculature, including coronary arteries.  Heart size normal.  No pericardial effusion. Small pre pericardiac lymph node is stable in size.  Tiny hiatal hernia.  Confluent soft tissue, bronchial wall thickening, volume loss and architectural distortion are again seen in the medial right hemithorax, stable.  Peribronchovascular nodularity appears fairly diffuse in the right lung and appears slightly progressive in the interval.  There is narrowing of the right middle lobe bronchus, as before.  No pleural fluid.  Debris is seen in the right mainstem bronchus.  Incidental imaging of the upper abdomen shows no acute findings. No worrisome lytic or sclerotic lesions.  IMPRESSION:  1.  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 2.  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  Original  Report Authenticated By: Reyes Ivan, M.D.    ASSESSMENT: This is a very pleasant 56 years old white male with history of limited stage small cell lung cancer diagnosed almost 7 years ago. Status post systemic chemotherapy concurrent with radiation followed by prophylactic cranial irradiation. The patient is doing fine and he has no evidence for disease recurrence. I discussed the scan results with the patient.  PLAN: I recommended for him continuous observation with repeat CT scan of the chest in one year and the patient would come back for followup visit at that time. He was advised to call me immediately if he has any concerning symptoms in the interval.   All questions were answered. The patient knows to call the clinic with any problems, questions or concerns. We  can certainly see the patient much sooner if necessary.

## 2011-06-20 MED ORDER — TRAZODONE HCL 100 MG PO TABS: 100.0000 mg | ORAL_TABLET | Freq: Every day | ORAL | Status: DC

## 2011-06-20 NOTE — Assessment & Plan Note (Signed)
This has been a persistent problem for him.  He has terrible sleep hygiene (watches TV in bed and was sleeping when I called at 10:30 this morning.  Explained that I will try to help with meds but the help would be limited with his habits.

## 2011-06-30 ENCOUNTER — Encounter: Payer: Self-pay | Admitting: Family Medicine

## 2011-06-30 ENCOUNTER — Ambulatory Visit (INDEPENDENT_AMBULATORY_CARE_PROVIDER_SITE_OTHER): Payer: Medicare Other | Admitting: Family Medicine

## 2011-06-30 DIAGNOSIS — R634 Abnormal weight loss: Secondary | ICD-10-CM | POA: Diagnosis not present

## 2011-06-30 DIAGNOSIS — J449 Chronic obstructive pulmonary disease, unspecified: Secondary | ICD-10-CM | POA: Diagnosis not present

## 2011-06-30 NOTE — Assessment & Plan Note (Signed)
Doubt significant pathology.  Will monitor.

## 2011-06-30 NOTE — Progress Notes (Signed)
  Subjective:    Patient ID: [name], male    DOB: 03-11-1956, 56 y.o.   MRN: [id number]  HPI  Has two complaints. First, seems to be losing wt.  This is scary for him since that happened with his lung cancer.  More likely it is diet related.  He lives at home and his mother did much of the cooking and shopping.  Her health has declined and she has early dementia.  As such, he is eating less.  Reviewed wt record and the wt loss is slow.  He has no change in appetite, bowel or bladder function.  No bleeding.  Recently seen by oncologist and no evidence of recurrent lung cancer.  Second issue, he has had a cough intermitantly for over a month.  He was treated with one round of antibiotics, improved, then relapsed.  Just completed a second round of antibiotics given by oncologist.    Review of Systems     Objective:   Physical Exam  Neck no mass or thyromegally Lungs clear Cardiac RRR without m or g.       Assessment & Plan:

## 2011-06-30 NOTE — Patient Instructions (Signed)
I am not too worried about the weight loss right now.  See me again in about 4-6 weeks just so we can keep an eye on your weight.  You could lose down to 160 lbs and that would be a good weight for you - as long as you lose the weight slowly.  Since you just finished antibiotics and your lungs sound good today, lets wait another week or two before testing.  If you are still coughing, call me and I will arrange for you to have a Chest Xray done.

## 2011-06-30 NOTE — Assessment & Plan Note (Signed)
Given clear lung exam and recent completion of antibiotics, observe cough for now.  If worsens will need CXR.

## 2011-07-28 ENCOUNTER — Ambulatory Visit: Payer: Medicare Other | Admitting: Family Medicine

## 2011-08-06 ENCOUNTER — Other Ambulatory Visit: Payer: Self-pay | Admitting: Family Medicine

## 2011-08-06 NOTE — Telephone Encounter (Signed)
Refill request

## 2011-08-30 ENCOUNTER — Ambulatory Visit (INDEPENDENT_AMBULATORY_CARE_PROVIDER_SITE_OTHER): Payer: Medicare Other | Admitting: Family Medicine

## 2011-08-30 ENCOUNTER — Encounter: Payer: Self-pay | Admitting: Family Medicine

## 2011-08-30 DIAGNOSIS — J441 Chronic obstructive pulmonary disease with (acute) exacerbation: Secondary | ICD-10-CM

## 2011-08-30 DIAGNOSIS — J309 Allergic rhinitis, unspecified: Secondary | ICD-10-CM

## 2011-08-30 MED ORDER — AZITHROMYCIN 500 MG PO TABS: 500.0000 mg | ORAL_TABLET | Freq: Every day | ORAL | Status: AC

## 2011-08-30 MED ORDER — FLUTICASONE PROPIONATE 50 MCG/ACT NA SUSP: 1.0000 | Freq: Every day | NASAL | Status: DC

## 2011-08-30 MED ORDER — PREDNISONE 20 MG PO TABS: 60.0000 mg | ORAL_TABLET | Freq: Every day | ORAL | Status: AC

## 2011-08-30 NOTE — Patient Instructions (Signed)
I know the stress is high.  Still, you need to quit smoking. I sent in an antibiotic and prednisone for your COPD exacerbation. Otherwise things look good.   See me in a few months if things are going well.  Sooner if your lungs don't clear up. Good luck with your mom and your family.

## 2011-08-31 NOTE — Progress Notes (Signed)
  Subjective:    Patient ID: [name], male    DOB: [birth year], 57 y.o.   MRN: [id number]  HPI  Patient with COPD who continues to smoke and exposed to significant passive smoke.  Now with recurrent cough.  Also, large stress at home with mother who has progressive dementia.    Review of Systems     Objective:   Physical Exam Lungs normal work of breathing.  Diffuse mild expiratory wheezes.  Good air movement.        Assessment & Plan:

## 2011-08-31 NOTE — Assessment & Plan Note (Signed)
Likely related to smoking.

## 2011-09-18 ENCOUNTER — Telehealth: Payer: Self-pay | Admitting: Family Medicine

## 2011-09-18 NOTE — Telephone Encounter (Signed)
Dr. Leveda Anna, I spoke with this patient about below. He is wondering if this actually contains nicotine. I have tried to look and got 2 different answers when I look online. He says he doesn't want to get them if it does contain nicotine. Can you help me with this please. Huntley Dec

## 2011-09-18 NOTE — Telephone Encounter (Signed)
Would like to speak to Dr. Leveda Anna about a product called "Blu".  It doesn't use Tobacco, but it does blow out smoke.

## 2011-09-18 NOTE — Telephone Encounter (Signed)
I have no great knowledge of this product.  He may either use or not use based on his assessment of cost benefit.

## 2011-09-19 NOTE — Telephone Encounter (Signed)
LM for patient to call me back. 

## 2011-09-21 NOTE — Telephone Encounter (Signed)
LM for patent to call back

## 2011-09-22 NOTE — Telephone Encounter (Signed)
Attempted to call patient back to discuss below again and no return call. Will close encounter due to this

## 2011-09-22 NOTE — Telephone Encounter (Signed)
Called and discussed.  I could not scientifically recommend this product as a proven treatment for smoking cessation.

## 2011-10-01 IMAGING — CR DG CHEST 2V
2 series · 2 of 2 positions shown · non-contrast
Comparison: Chest x-ray of 01/16/2010

CLINICAL DATA: Fell on [REDACTED], pain between shoulder blades

CHEST - 2 VIEW

[view not recorded (1 of 2)]
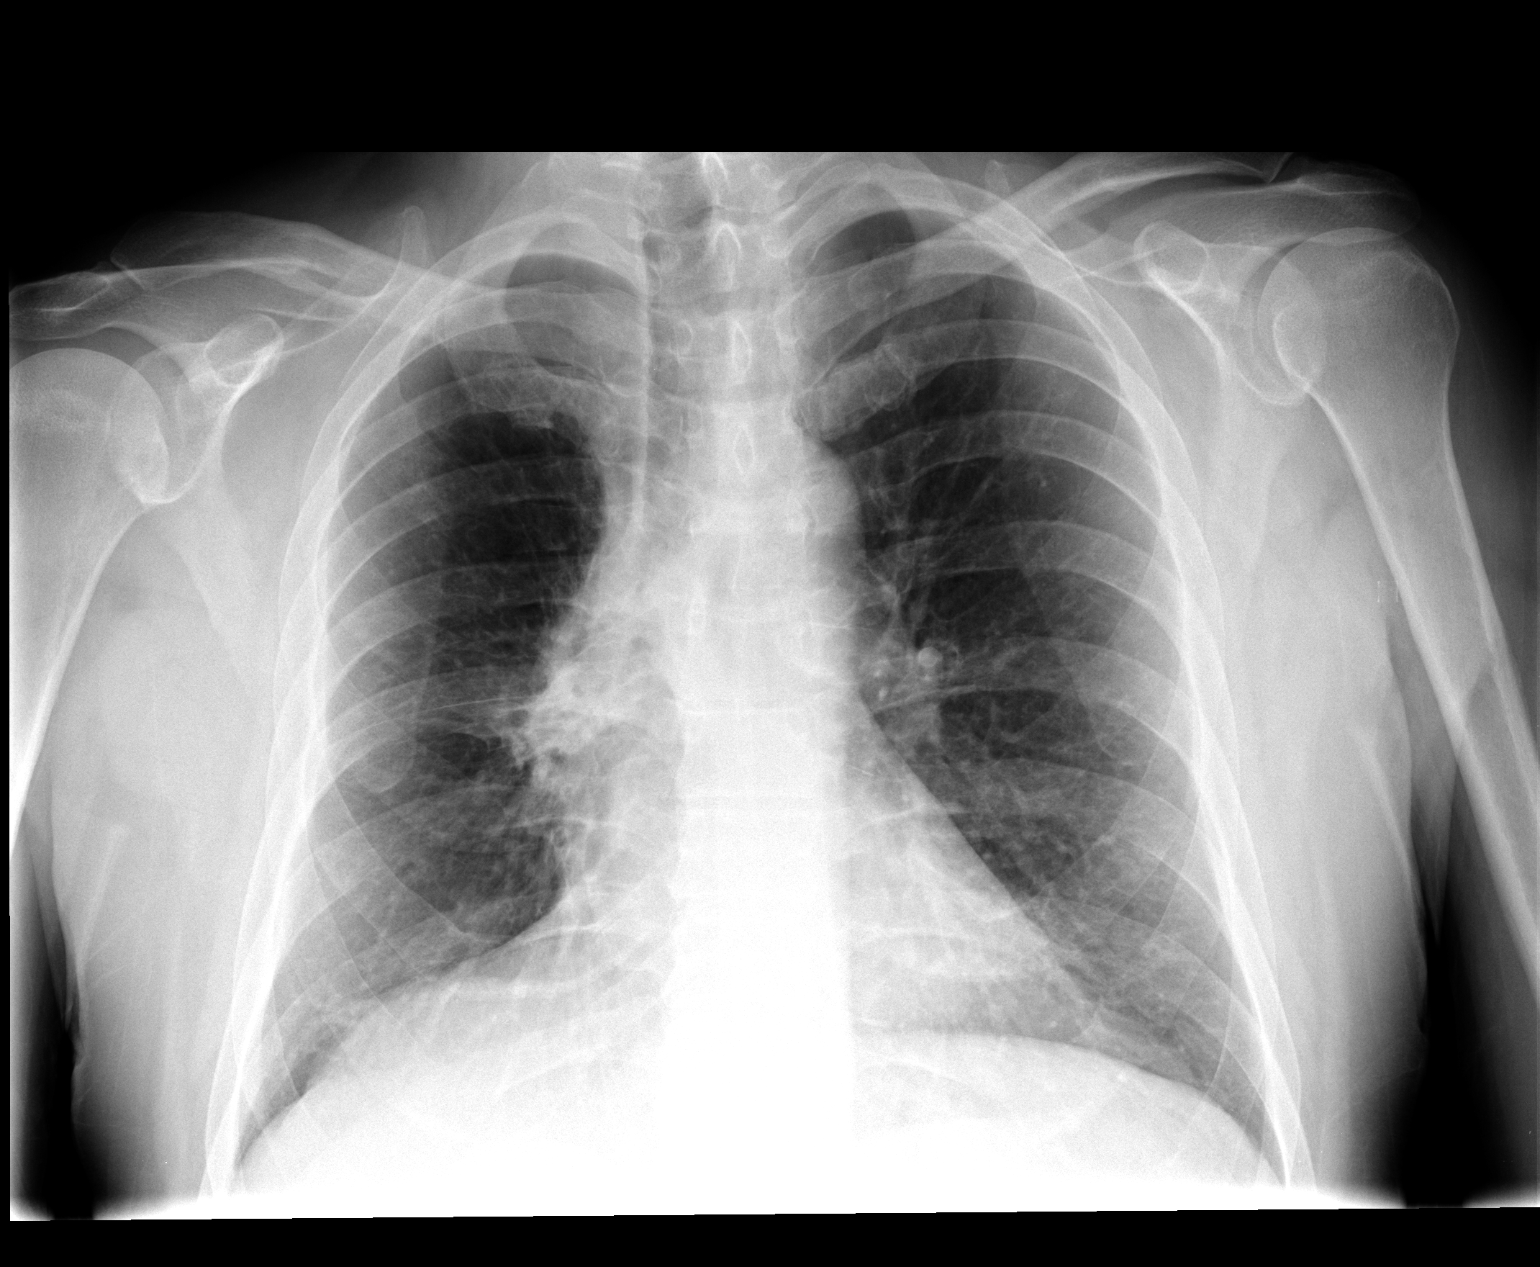

[view not recorded (2 of 2)]
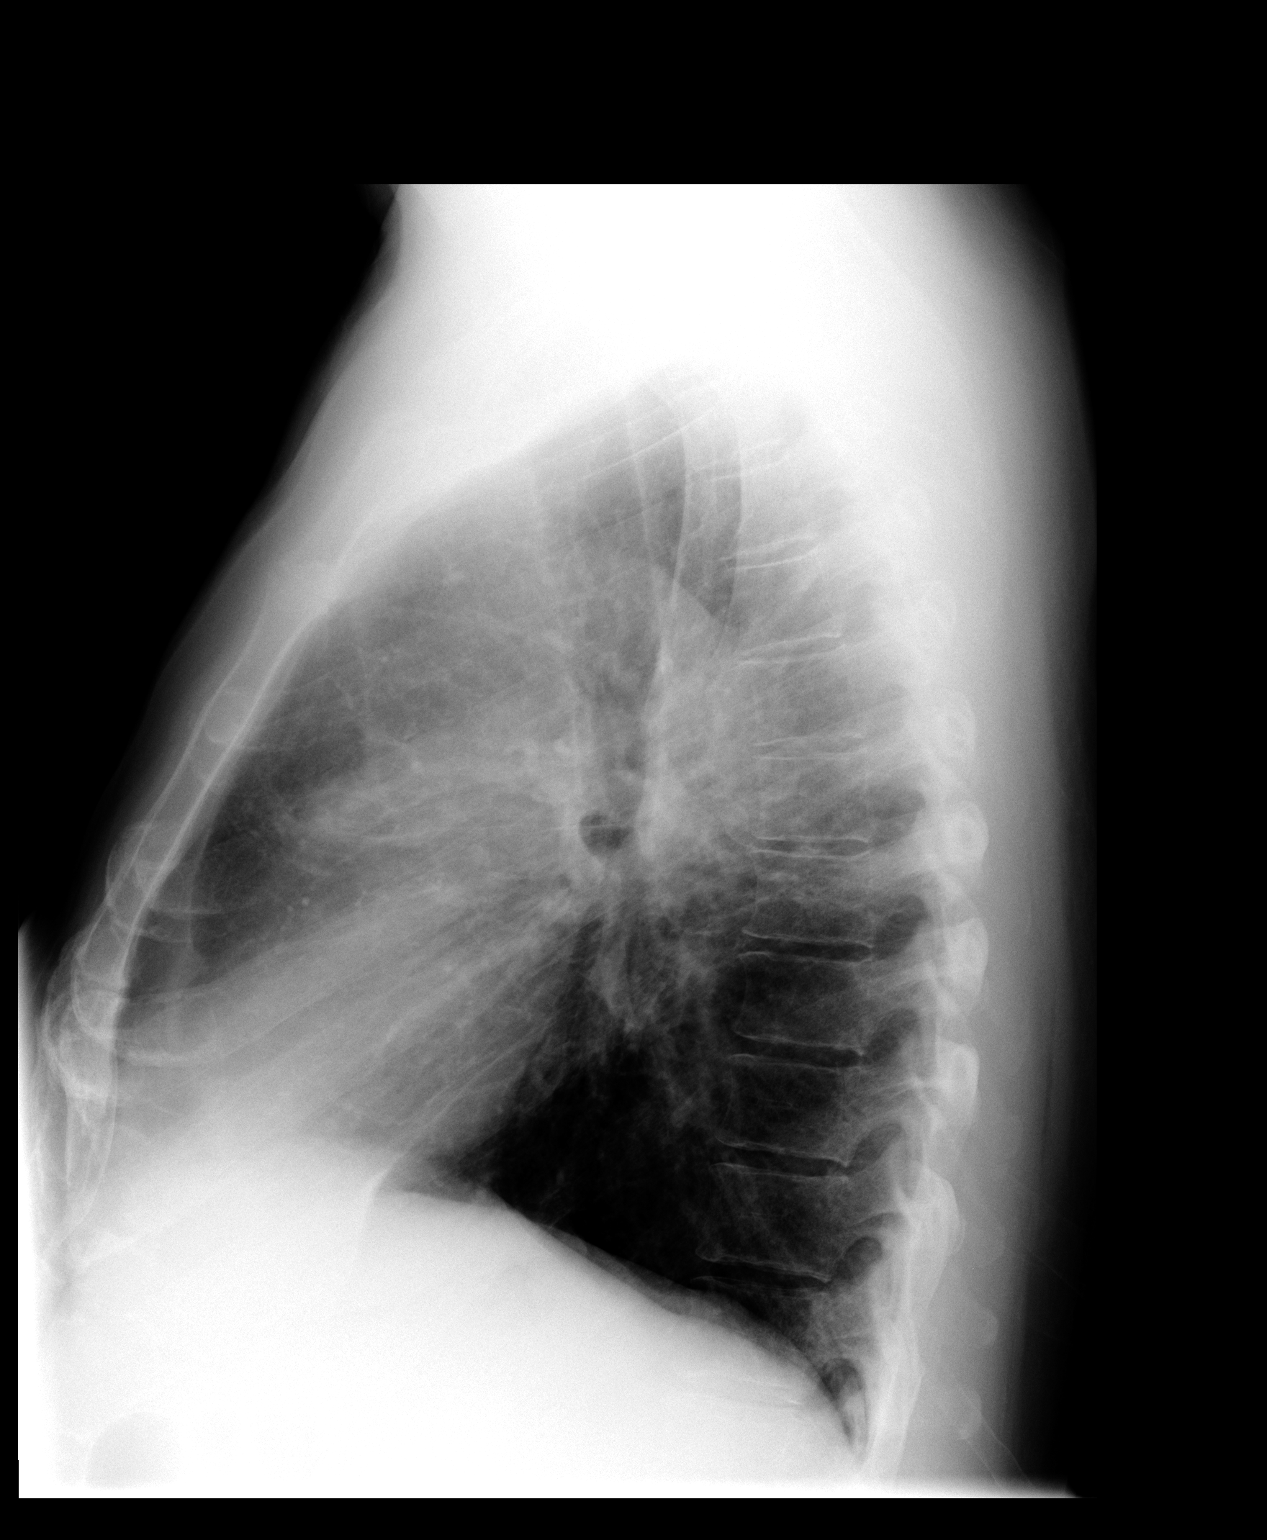

[2 of 2 positions shown; findings below may reference images not displayed]

FINDINGS: Opacity overlying the right hilum apparently is due to
radiation fibrosis when compared to prior images.  No infiltrate or
effusion is seen.  Mediastinal contours are stable.  The heart is
within normal limits in size.  No bony abnormality is seen.
IMPRESSION: Stable chest x-ray with probable radiation fibrosis overlying the
right hilar region.  No active process.

## 2011-11-01 ENCOUNTER — Other Ambulatory Visit (HOSPITAL_COMMUNITY): Payer: Medicare Other

## 2011-11-09 ENCOUNTER — Other Ambulatory Visit: Payer: Self-pay | Admitting: Family Medicine

## 2011-12-06 ENCOUNTER — Ambulatory Visit (HOSPITAL_COMMUNITY)
Admission: RE | Admit: 2011-12-06 | Discharge: 2011-12-06 | Disposition: A | Discharge status: Home / Self Care | Payer: Medicare Other | Source: Ambulatory Visit | Attending: Family Medicine | Admitting: Family Medicine

## 2011-12-06 ENCOUNTER — Encounter: Payer: Self-pay | Admitting: Family Medicine

## 2011-12-06 ENCOUNTER — Ambulatory Visit (INDEPENDENT_AMBULATORY_CARE_PROVIDER_SITE_OTHER): Payer: Medicare Other | Admitting: Family Medicine

## 2011-12-06 ENCOUNTER — Telehealth: Payer: Self-pay | Admitting: Family Medicine

## 2011-12-06 VITALS — BP 122/81 | HR 119 | Temp 98.2°F | Ht 66.0 in | Wt 170.0 lb

## 2011-12-06 DIAGNOSIS — J441 Chronic obstructive pulmonary disease with (acute) exacerbation: Secondary | ICD-10-CM | POA: Insufficient documentation

## 2011-12-06 DIAGNOSIS — R042 Hemoptysis: Secondary | ICD-10-CM | POA: Insufficient documentation

## 2011-12-06 DIAGNOSIS — R634 Abnormal weight loss: Secondary | ICD-10-CM | POA: Insufficient documentation

## 2011-12-06 DIAGNOSIS — J449 Chronic obstructive pulmonary disease, unspecified: Secondary | ICD-10-CM | POA: Diagnosis not present

## 2011-12-06 MED ORDER — ALBUTEROL SULFATE (2.5 MG/3ML) 0.083% IN NEBU
2.5000 mg | INHALATION_SOLUTION | Freq: Once | RESPIRATORY_TRACT | Status: AC
  Administered 2011-12-06: 2.5 mg via RESPIRATORY_TRACT

## 2011-12-06 MED ORDER — GUAIFENESIN-CODEINE 100-10 MG/5ML PO SYRP: 5.0000 mL | ORAL_SOLUTION | Freq: Every evening | ORAL | Status: DC | PRN

## 2011-12-06 MED ORDER — DOXYCYCLINE HYCLATE 100 MG PO TABS: 100.0000 mg | ORAL_TABLET | Freq: Two times a day (BID) | ORAL | Status: AC

## 2011-12-06 MED ORDER — PREDNISONE 50 MG PO TABS: ORAL_TABLET | ORAL | Status: AC

## 2011-12-06 MED ORDER — IPRATROPIUM BROMIDE 0.02 % IN SOLN
0.5000 mg | Freq: Once | RESPIRATORY_TRACT | Status: AC
  Administered 2011-12-06: 0.5 mg via RESPIRATORY_TRACT

## 2011-12-06 NOTE — Progress Notes (Signed)
Patient ID: [name], male   DOB: Sep 04, 1955, 56 y.o.   MRN: [id number] [name] is a 56 y.o. male with PMH significant for Lung CA treated in 2006 with chemo/radiation as well as known COPD who presents to [facility] today for roughly 1 month history of cough:  1.  Cough:  Has had what looks like 2 COPD exacerbations thus far this year.  Treated with steroids and antibiotics.  Felt better for about a week after then cough returned.  Worse at night, productive of dark brown sputum.  Feels pants are looser.  Review of records reveals about 10 lb weight loss in past 6 months.    Came today due to hemoptysis.  Has history of the same several months ago.  Described several small red drops in toilet after coughing, has produced larger amounts of blood in past.  Using Albuterol inhaler without much relief.  Does have some mild dyspnea on exertion.   No lightheadedness, pre-syncopal episodes.    The following portions of the patient's history were reviewed and updated as appropriate: allergies, current medications, past medical history, family and social history, and problem list.  Patient is a nonsmoker.   ROS as above otherwise neg.  No chest pain, palpitations, abdominal pain, bone pain.   Medications reviewed. Current Outpatient Prescriptions  Medication Sig Dispense Refill  . albuterol (PROAIR HFA) 108 (90 BASE) MCG/ACT inhaler Inhale 2 puffs into the lungs every 6 (six) hours as needed for wheezing.  1 Inhaler  4  . aspirin (BAYER ASPIRIN) 325 MG tablet Take 1 tablet (325 mg total) by mouth daily.      . cyclobenzaprine (FLEXERIL) 10 MG tablet TAKE 1 TABLET BY MOUTH 3 TIMES DAILY AS NEEDED FOR MUSCLE SPASMS  90 tablet  12  . fluticasone (FLONASE) 50 MCG/ACT nasal spray Place 1 spray into the nose daily.  16 g  12  . fluticasone (FLOVENT HFA) 220 MCG/ACT inhaler Inhale 2 puffs into the lungs 2 (two) times daily.  1 Inhaler  12  . gabapentin (NEURONTIN) 300 MG capsule Take 1 capsule (300  mg total) by mouth 3 (three) times daily.  90 capsule  2  . guaiFENesin-codeine (ROBITUSSIN AC) 100-10 MG/5ML syrup Take 5 mLs by mouth at bedtime as needed for cough.  120 mL  0  . hydrochlorothiazide (HYDRODIURIL) 12.5 MG tablet Take 1 tablet (12.5 mg total) by mouth daily. For high blood pressure  90 tablet  3  . ibuprofen (ADVIL,MOTRIN) 800 MG tablet Take 1 tablet (800 mg total) by mouth every 8 (eight) hours as needed for pain.  90 tablet  6  . LORazepam (ATIVAN) 1 MG tablet Take 1 tablet (1 mg total) by mouth every 6 (six) hours as needed for anxiety.  45 tablet  5  . omeprazole (PRILOSEC) 20 MG capsule take 1 capsule by mouth once daily  60 capsule  3  . simvastatin (ZOCOR) 10 MG tablet Take 1 tablet (10 mg total) by mouth every evening.  30 tablet  11    Exam:  BP 122/81  Pulse 119  Temp 98.2 F (36.8 C) (Oral)  Ht 5\' 6"  (1.676 m)  Wt 170 lb (77.111 kg)  BMI 27.44 kg/m2  SpO2 96% Gen: Well NAD HEENT: EOMI,  MMM Lungs: Diffuse rhonchi noted throughout all posterior lung fields.  Also noted anterior lung fields.  Some wheezing as well, no rales.   Heart: RRR no MRG Abd: NABS, NT, ND Exts: Non edematous BL  LE,  warm and well perfused.   No results found for this or any previous visit (from the past 72 hour(s)).

## 2011-12-06 NOTE — Assessment & Plan Note (Signed)
Most likely COPD exacerbation as above, however concern is that he has both subjective as well as objective weight loss of about 10 lbs in past 6 months.   Plan to send this former patient with lung cancer for chest x-ray.  If hemoptysis continues, instructions given to call us or oncologist.

## 2011-12-06 NOTE — Telephone Encounter (Signed)
Called and discussed negative x-ray results with patient.  Patient appreciative.  Recommended that if hemoptysis and weight loss continue, should contact oncologist.  Patient agreed with plan.  FU in 2-3 weeks for recheck

## 2011-12-06 NOTE — Assessment & Plan Note (Addendum)
Nebulizer treatment provided in clinic.  Believe patient currently exhibiting COPD exacerbation.   Plan to treat with antibiotics and steroids.   See hemoptysis below.

## 2011-12-06 NOTE — Patient Instructions (Addendum)
We are going to send you for a chest x-ray today. You've lost 10 lbs since January.  Take the Doxycycline 1 pill in morning and 1 pill in evening. Take the Prednisone 1 pill a day x 5 days. I have ordered some cough syrup for you as well.

## 2011-12-07 ENCOUNTER — Other Ambulatory Visit: Payer: Self-pay | Admitting: Family Medicine

## 2012-02-01 ENCOUNTER — Encounter (HOSPITAL_COMMUNITY): Payer: Self-pay | Admitting: Emergency Medicine

## 2012-02-01 ENCOUNTER — Emergency Department (INDEPENDENT_AMBULATORY_CARE_PROVIDER_SITE_OTHER)
Admission: EM | Admit: 2012-02-01 | Discharge: 2012-02-01 | Disposition: A | Discharge status: Home / Self Care | Payer: Medicare Other | Source: Home / Self Care | Attending: Emergency Medicine | Admitting: Emergency Medicine

## 2012-02-01 ENCOUNTER — Emergency Department (INDEPENDENT_AMBULATORY_CARE_PROVIDER_SITE_OTHER): Payer: Medicare Other

## 2012-02-01 DIAGNOSIS — M79609 Pain in unspecified limb: Secondary | ICD-10-CM | POA: Diagnosis not present

## 2012-02-01 DIAGNOSIS — M779 Enthesopathy, unspecified: Secondary | ICD-10-CM | POA: Diagnosis not present

## 2012-02-01 MED ORDER — MELOXICAM 15 MG PO TABS: 15.0000 mg | ORAL_TABLET | Freq: Every day | ORAL | Status: DC

## 2012-02-01 NOTE — ED Notes (Signed)
r thumb  Spica    Med
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
## 2012-02-01 NOTE — ED Notes (Signed)
Pt    Reports    Pain  r  Thumb     X  10 days        denys  Any  Recent   Injury          Reports  Old  Injury to  That thumb              He  denys  Any  Other  Symptoms

## 2012-02-12 ENCOUNTER — Encounter: Payer: Self-pay | Admitting: Gastroenterology

## 2012-02-15 ENCOUNTER — Ambulatory Visit (INDEPENDENT_AMBULATORY_CARE_PROVIDER_SITE_OTHER): Payer: Medicare Other | Admitting: Family Medicine

## 2012-02-15 ENCOUNTER — Encounter: Payer: Self-pay | Admitting: Family Medicine

## 2012-02-15 VITALS — BP 129/90 | HR 115 | Temp 97.6°F | Ht 66.0 in | Wt 169.8 lb

## 2012-02-15 DIAGNOSIS — Z23 Encounter for immunization: Secondary | ICD-10-CM | POA: Diagnosis not present

## 2012-02-15 DIAGNOSIS — L659 Nonscarring hair loss, unspecified: Secondary | ICD-10-CM | POA: Insufficient documentation

## 2012-02-16 ENCOUNTER — Telehealth: Payer: Self-pay | Admitting: Family Medicine

## 2012-02-16 NOTE — Telephone Encounter (Signed)
Spoke with patient and informed him that lab has come back normal

## 2012-02-16 NOTE — Telephone Encounter (Signed)
Pt wants to know if everything came back ok on his bloodwork.

## 2012-02-19 ENCOUNTER — Encounter: Payer: Self-pay | Admitting: Family Medicine

## 2012-02-19 NOTE — Assessment & Plan Note (Signed)
Doubt significant.  Looks like traction/friction alopecia.  Will check TSH.  Asked to look for anything that might be causing excess friction on that eyebrow

## 2012-02-19 NOTE — Progress Notes (Signed)
  Subjective:    Patient ID: [name], male    DOB: 07/04/1955, 56 y.o.   MRN: [id number]  HPI Complains of hair loss from Right eyebrow.  Not other places.  Stress level is high.  No heat/cold intollerance.    Review of Systems     Objective:   Physical ExamNormal thyroid. Lateral aspect of right eyebrow has hair loss with hair broken off at skin.          Assessment & Plan:

## 2012-02-26 ENCOUNTER — Telehealth: Payer: Self-pay | Admitting: Internal Medicine

## 2012-02-26 NOTE — Telephone Encounter (Signed)
Pt came in and appt calendar given to patient today for January 2014 lab, Ct then MD

## 2012-03-06 ENCOUNTER — Encounter: Payer: Self-pay | Admitting: Family Medicine

## 2012-03-06 ENCOUNTER — Ambulatory Visit (INDEPENDENT_AMBULATORY_CARE_PROVIDER_SITE_OTHER): Payer: Medicare Other | Admitting: Family Medicine

## 2012-03-06 VITALS — BP 113/78 | HR 113 | Temp 98.0°F | Wt 168.0 lb

## 2012-03-06 DIAGNOSIS — E7800 Pure hypercholesterolemia, unspecified: Secondary | ICD-10-CM | POA: Diagnosis not present

## 2012-03-06 DIAGNOSIS — F17200 Nicotine dependence, unspecified, uncomplicated: Secondary | ICD-10-CM | POA: Diagnosis not present

## 2012-03-06 DIAGNOSIS — Z8673 Personal history of transient ischemic attack (TIA), and cerebral infarction without residual deficits: Secondary | ICD-10-CM | POA: Diagnosis not present

## 2012-03-06 DIAGNOSIS — N529 Male erectile dysfunction, unspecified: Secondary | ICD-10-CM

## 2012-03-06 LAB — LIPID PANEL
Cholesterol: 175 mg/dL (ref 0–200)
LDL Cholesterol: 119 mg/dL — ABNORMAL HIGH (ref 0–99)
Total CHOL/HDL Ratio: 5.3 Ratio
VLDL: 23 mg/dL (ref 0–40)

## 2012-03-06 NOTE — Assessment & Plan Note (Signed)
May be contributing to ED.

## 2012-03-06 NOTE — Patient Instructions (Addendum)
Quit smoking.  It will help your sex life. I will call with blood work results. Also stop the cyclobenzaprene (flexeril) because it my cause problems with erections. If you quit smoking and the flexeril and are still having problems, come back and I will prescribe viagra.

## 2012-03-06 NOTE — Assessment & Plan Note (Signed)
Not taking simvastatin, recheck lipids.

## 2012-03-07 DIAGNOSIS — N529 Male erectile dysfunction, unspecified: Secondary | ICD-10-CM | POA: Insufficient documentation

## 2012-03-07 MED ORDER — SIMVASTATIN 20 MG PO TABS: 20.0000 mg | ORAL_TABLET | Freq: Every day | ORAL | Status: DC

## 2012-03-07 NOTE — Assessment & Plan Note (Signed)
Goal LDL will be less than 100

## 2012-03-07 NOTE — Assessment & Plan Note (Signed)
Stop smoking and flexeril which may be contributing.  Will consider viagra if still a problem once smoking stopped.

## 2012-03-07 NOTE — Progress Notes (Signed)
  Subjective:    Patient ID: [name], male    DOB: August 05, 1955, 57 y.o.   MRN: [id number]  HPI  Several issues: Sleeping remains a problem Having trouble with erectile dysfunction.  Still smokes Med rec: not taking statin    Review of Systems     Objective:   Physical ExamLungs clear Cardiac RRR without m or g       Assessment & Plan:

## 2012-03-22 ENCOUNTER — Other Ambulatory Visit: Payer: Self-pay | Admitting: Family Medicine

## 2012-04-16 ENCOUNTER — Other Ambulatory Visit: Payer: Self-pay | Admitting: Family Medicine

## 2012-04-16 ENCOUNTER — Telehealth: Payer: Self-pay | Admitting: Family Medicine

## 2012-04-16 DIAGNOSIS — Z1211 Encounter for screening for malignant neoplasm of colon: Secondary | ICD-10-CM

## 2012-04-16 NOTE — Telephone Encounter (Signed)
Attempted to call patient 2 times got busy signal. Wanted to let him know that I did put in a referral for the colonoscopy and it was put in [facility] worqueue

## 2012-04-16 NOTE — Telephone Encounter (Signed)
Got a letter from [facility] stating that it is time for his colonoscopy and that Dr Leveda Anna will have to refer because of his insurance.

## 2012-04-17 NOTE — Telephone Encounter (Signed)
LVM for patient to call back to inform him that referral for the colonoscopy was sent out yesterday

## 2012-04-22 ENCOUNTER — Other Ambulatory Visit: Payer: Self-pay | Admitting: Family Medicine

## 2012-05-01 ENCOUNTER — Encounter: Payer: Self-pay | Admitting: Gastroenterology

## 2012-06-06 ENCOUNTER — Ambulatory Visit (AMBULATORY_SURGERY_CENTER): Payer: Medicare Other | Admitting: *Deleted

## 2012-06-06 VITALS — Ht 66.0 in | Wt 175.0 lb

## 2012-06-06 DIAGNOSIS — Z1211 Encounter for screening for malignant neoplasm of colon: Secondary | ICD-10-CM

## 2012-06-06 MED ORDER — MOVIPREP 100 G PO SOLR: ORAL | Status: DC

## 2012-06-11 ENCOUNTER — Encounter: Payer: Self-pay | Admitting: Gastroenterology

## 2012-06-11 ENCOUNTER — Ambulatory Visit (AMBULATORY_SURGERY_CENTER): Payer: Medicare Other | Admitting: Gastroenterology

## 2012-06-11 VITALS — BP 139/82 | HR 94 | Temp 96.7°F | Resp 31 | Ht 66.0 in | Wt 175.0 lb

## 2012-06-11 DIAGNOSIS — Z1211 Encounter for screening for malignant neoplasm of colon: Secondary | ICD-10-CM | POA: Diagnosis not present

## 2012-06-11 DIAGNOSIS — Z86010 Personal history of colonic polyps: Secondary | ICD-10-CM

## 2012-06-11 DIAGNOSIS — D126 Benign neoplasm of colon, unspecified: Secondary | ICD-10-CM | POA: Diagnosis not present

## 2012-06-11 DIAGNOSIS — J449 Chronic obstructive pulmonary disease, unspecified: Secondary | ICD-10-CM | POA: Diagnosis not present

## 2012-06-11 MED ORDER — SODIUM CHLORIDE 0.9 % IV SOLN: 500.0000 mL | INTRAVENOUS | Status: DC

## 2012-06-11 NOTE — Op Note (Signed)
[facility] [address] [place], [address]   COLONOSCOPY PROCEDURE REPORT  PATIENT: [name], [name]  MR#: [id number] BIRTHDATE: 05/04/1956 , 56  yrs. 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 MEDICATIONS: MAC sedation, administered by CRNA and propofol (Diprivan) 200mg  IV DESCRIPTION OF PROCEDURE:   After the risks benefits and alternatives of the procedure were thoroughly explained, informed consent was obtained.  A digital rectal exam revealed no abnormalities of the rectum.   The LB [id number]  endoscope was introduced through the anus and advanced to the cecum, which was identified by both the appendix and ileocecal valve. No adverse events experienced.   The quality of the prep was adequate, using MoviPrep  The instrument was then slowly withdrawn as the colon was fully examined.  COLON FINDINGS: Five sessile polyps ranging between 5-79mm in size were found in the ascending colon, transverse colon, and descending colon.  A polypectomy was performed with a cold snare.  The resection was complete and the polyp tissue was completely retrieved.  The colon was otherwise normal.  There was no diverticulosis, inflammation, polyps or cancers unless previously stated.  Retroflexed views revealed no abnormalities. The time to cecum=2 minutes 45 seconds.  Withdrawal time=12 minutes 46 seconds. The scope was withdrawn and the procedure completed.  COMPLICATIONS: There were no complications.  ENDOSCOPIC IMPRESSION: 1.  Five sessile polyps ranging between 5-8 mm in the ascending colon, transverse colon, and descending colon; polypectomy performed with a cold snare 2.    The colon was otherwise normal  RECOMMENDATIONS: 1.  Await pathology results 2.  Repeat  Colonoscopy in 3 years  eSigned:  Meryl Dare, MD, [facility] 06/11/2012 2:52 PM

## 2012-06-11 NOTE — Progress Notes (Signed)
Called to room to assist during endoscopic procedure.  Patient ID and intended procedure confirmed with present staff. Received instructions for my participation in the procedure from the performing physician.  

## 2012-06-11 NOTE — Patient Instructions (Addendum)
A handout was given to your care partner on polyps.  You may resume your current medications today.  Please call if any questions or concerns.    YOU HAD AN ENDOSCOPIC PROCEDURE TODAY AT THE [facility]: Refer to the procedure report that was given to you for any specific questions about what was found during the examination.  If the procedure report does not answer your questions, please call your gastroenterologist to clarify.  If you requested that your care partner not be given the details of your procedure findings, then the procedure report has been included in a sealed envelope for you to review at your convenience later.  YOU SHOULD EXPECT: Some feelings of bloating in the abdomen. Passage of more gas than usual.  Walking can help get rid of the air that was put into your GI tract during the procedure and reduce the bloating. If you had a lower endoscopy (such as a colonoscopy or flexible sigmoidoscopy) you may notice spotting of blood in your stool or on the toilet paper. If you underwent a bowel prep for your procedure, then you may not have a normal bowel movement for a few days.  DIET: Your first meal following the procedure should be a light meal and then it is ok to progress to your normal diet.  A half-sandwich or bowl of soup is an example of a good first meal.  Heavy or fried foods are harder to digest and may make you feel nauseous or bloated.  Likewise meals heavy in dairy and vegetables can cause extra gas to form and this can also increase the bloating.  Drink plenty of fluids but you should avoid alcoholic beverages for 24 hours.  ACTIVITY: Your care partner should take you home directly after the procedure.  You should plan to take it easy, moving slowly for the rest of the day.  You can resume normal activity the day after the procedure however you should NOT DRIVE or use heavy machinery for 24 hours (because of the sedation medicines used during the test).    SYMPTOMS  TO REPORT IMMEDIATELY: A gastroenterologist can be reached at any hour.  During normal business hours, 8:30 AM to 5:00 PM Monday through Friday, call [phone].  After hours and on weekends, please call the GI answering service at [phone] who will take a message and have the physician on call contact you.   Following lower endoscopy (colonoscopy or flexible sigmoidoscopy):  Excessive amounts of blood in the stool  Significant tenderness or worsening of abdominal pains  Swelling of the abdomen that is new, acute  Fever of 100F or higher    FOLLOW UP: If any biopsies were taken you will be contacted by phone or by letter within the next 1-3 weeks.  Call your gastroenterologist if you have not heard about the biopsies in 3 weeks.  Our staff will call the home number listed on your records the next business day following your procedure to check on you and address any questions or concerns that you may have at that time regarding the information given to you following your procedure. This is a courtesy call and so if there is no answer at the home number and we have not heard from you through the emergency physician on call, we will assume that you have returned to your regular daily activities without incident.  SIGNATURES/CONFIDENTIALITY: You and/or your care partner have signed paperwork which will be entered into your electronic medical record.    These signatures attest to the fact that that the information above on your After Visit Summary has been reviewed and is understood.  Full responsibility of the confidentiality of this discharge information lies with you and/or your care-partner.  
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
## 2012-06-11 NOTE — Progress Notes (Signed)
No complaints noted in the recovery room. Maw  Patient did not experience any of the following events: a burn prior to discharge; a fall within the facility; wrong site/side/patient/procedure/implant event; or a hospital transfer or hospital admission upon discharge from the facility. (G8907) Patient did not have preoperative order for IV antibiotic SSI prophylaxis. (G8918)  

## 2012-06-12 ENCOUNTER — Telehealth: Payer: Self-pay | Admitting: *Deleted

## 2012-06-12 NOTE — Telephone Encounter (Signed)
Message left

## 2012-06-17 ENCOUNTER — Encounter: Payer: Self-pay | Admitting: Gastroenterology

## 2012-06-18 ENCOUNTER — Ambulatory Visit (HOSPITAL_COMMUNITY)
Admission: RE | Admit: 2012-06-18 | Discharge: 2012-06-18 | Disposition: A | Discharge status: Home / Self Care | Payer: Medicare Other | Source: Ambulatory Visit | Attending: Internal Medicine | Admitting: Internal Medicine

## 2012-06-18 ENCOUNTER — Other Ambulatory Visit (HOSPITAL_BASED_OUTPATIENT_CLINIC_OR_DEPARTMENT_OTHER): Payer: Medicare Other | Admitting: Lab

## 2012-06-18 DIAGNOSIS — N200 Calculus of kidney: Secondary | ICD-10-CM | POA: Insufficient documentation

## 2012-06-18 DIAGNOSIS — C3490 Malignant neoplasm of unspecified part of unspecified bronchus or lung: Secondary | ICD-10-CM

## 2012-06-18 DIAGNOSIS — I700 Atherosclerosis of aorta: Secondary | ICD-10-CM | POA: Insufficient documentation

## 2012-06-18 DIAGNOSIS — Z9089 Acquired absence of other organs: Secondary | ICD-10-CM | POA: Insufficient documentation

## 2012-06-18 DIAGNOSIS — Z923 Personal history of irradiation: Secondary | ICD-10-CM | POA: Insufficient documentation

## 2012-06-18 DIAGNOSIS — I2510 Atherosclerotic heart disease of native coronary artery without angina pectoris: Secondary | ICD-10-CM | POA: Diagnosis not present

## 2012-06-18 DIAGNOSIS — J438 Other emphysema: Secondary | ICD-10-CM | POA: Insufficient documentation

## 2012-06-18 DIAGNOSIS — Z9221 Personal history of antineoplastic chemotherapy: Secondary | ICD-10-CM | POA: Insufficient documentation

## 2012-06-18 LAB — CBC WITH DIFFERENTIAL/PLATELET
Basophils Absolute: 0 10*3/uL (ref 0.0–0.1)
Eosinophils Absolute: 0.2 10*3/uL (ref 0.0–0.5)
HGB: 15.5 g/dL (ref 13.0–17.1)
NEUT#: 3.1 10*3/uL (ref 1.5–6.5)
RBC: 4.77 10*6/uL (ref 4.20–5.82)
RDW: 13.1 % (ref 11.0–14.6)
WBC: 6 10*3/uL (ref 4.0–10.3)
lymph#: 2 10*3/uL (ref 0.9–3.3)

## 2012-06-18 LAB — COMPREHENSIVE METABOLIC PANEL (CC13)
AST: 51 U/L — ABNORMAL HIGH (ref 5–34)
Albumin: 3.9 g/dL (ref 3.5–5.0)
BUN: 9 mg/dL (ref 7.0–26.0)
Calcium: 10 mg/dL (ref 8.4–10.4)
Chloride: 103 mEq/L (ref 98–107)
Glucose: 103 mg/dl — ABNORMAL HIGH (ref 70–99)
Potassium: 4 mEq/L (ref 3.5–5.1)
Sodium: 140 mEq/L (ref 136–145)
Total Protein: 7.9 g/dL (ref 6.4–8.3)

## 2012-06-18 MED ORDER — IOHEXOL 300 MG/ML  SOLN
80.0000 mL | Freq: Once | INTRAMUSCULAR | Status: AC | PRN
  Administered 2012-06-18: 80 mL via INTRAVENOUS

## 2012-06-20 ENCOUNTER — Encounter: Payer: Medicare Other | Admitting: Gastroenterology

## 2012-06-24 ENCOUNTER — Encounter: Payer: Self-pay | Admitting: *Deleted

## 2012-06-24 ENCOUNTER — Other Ambulatory Visit: Payer: Self-pay | Admitting: Family Medicine

## 2012-06-24 ENCOUNTER — Encounter: Payer: Self-pay | Admitting: Internal Medicine

## 2012-06-24 ENCOUNTER — Telehealth: Payer: Self-pay | Admitting: Internal Medicine

## 2012-06-24 ENCOUNTER — Encounter: Payer: Self-pay | Admitting: Family Medicine

## 2012-06-24 ENCOUNTER — Ambulatory Visit (HOSPITAL_BASED_OUTPATIENT_CLINIC_OR_DEPARTMENT_OTHER): Payer: Medicare Other | Admitting: Internal Medicine

## 2012-06-24 VITALS — BP 119/79 | HR 109 | Temp 98.2°F | Resp 20 | Ht 66.0 in | Wt 173.7 lb

## 2012-06-24 DIAGNOSIS — Z85118 Personal history of other malignant neoplasm of bronchus and lung: Secondary | ICD-10-CM | POA: Diagnosis not present

## 2012-06-24 DIAGNOSIS — C3490 Malignant neoplasm of unspecified part of unspecified bronchus or lung: Secondary | ICD-10-CM

## 2012-06-24 NOTE — Progress Notes (Signed)
Spoke with pt at [facility] today regarding smoking cessation.  Information/resource given and explained

## 2012-06-24 NOTE — Telephone Encounter (Signed)
Gave pt appt for lab and MD for January 2014 , Radiology will call regarding CT before MD visit

## 2012-06-24 NOTE — Patient Instructions (Signed)
No evidence for disease recurrence on his recent scan. Smoke cessation counseling was given today. Followup in one year with repeat CT scan of the chest.
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
## 2012-06-27 NOTE — Telephone Encounter (Signed)
Patient is calling to check the status of the refill request for his Tramadol.  He says he has been out since Tuesday.

## 2012-08-09 ENCOUNTER — Encounter (HOSPITAL_COMMUNITY): Payer: Self-pay | Admitting: Emergency Medicine

## 2012-08-09 ENCOUNTER — Emergency Department (HOSPITAL_COMMUNITY)
Admission: EM | Admit: 2012-08-09 | Discharge: 2012-08-09 | Disposition: A | Discharge status: Home / Self Care | Payer: Medicare Other | Attending: Emergency Medicine | Admitting: Emergency Medicine

## 2012-08-09 ENCOUNTER — Emergency Department (INDEPENDENT_AMBULATORY_CARE_PROVIDER_SITE_OTHER)
Admission: EM | Admit: 2012-08-09 | Discharge: 2012-08-09 | Disposition: A | Discharge status: Nursing Home (Includes ICF) | Payer: Medicare Other | Source: Home / Self Care | Attending: Emergency Medicine | Admitting: Emergency Medicine

## 2012-08-09 ENCOUNTER — Emergency Department (INDEPENDENT_AMBULATORY_CARE_PROVIDER_SITE_OTHER): Payer: Medicare Other

## 2012-08-09 ENCOUNTER — Encounter (HOSPITAL_COMMUNITY): Payer: Self-pay | Admitting: Cardiology

## 2012-08-09 DIAGNOSIS — Z85118 Personal history of other malignant neoplasm of bronchus and lung: Secondary | ICD-10-CM | POA: Diagnosis not present

## 2012-08-09 DIAGNOSIS — F17200 Nicotine dependence, unspecified, uncomplicated: Secondary | ICD-10-CM | POA: Diagnosis not present

## 2012-08-09 DIAGNOSIS — IMO0002 Reserved for concepts with insufficient information to code with codable children: Secondary | ICD-10-CM | POA: Diagnosis not present

## 2012-08-09 DIAGNOSIS — Z7982 Long term (current) use of aspirin: Secondary | ICD-10-CM | POA: Diagnosis not present

## 2012-08-09 DIAGNOSIS — J069 Acute upper respiratory infection, unspecified: Secondary | ICD-10-CM

## 2012-08-09 DIAGNOSIS — R05 Cough: Secondary | ICD-10-CM | POA: Diagnosis not present

## 2012-08-09 DIAGNOSIS — Z79899 Other long term (current) drug therapy: Secondary | ICD-10-CM | POA: Diagnosis not present

## 2012-08-09 DIAGNOSIS — R059 Cough, unspecified: Secondary | ICD-10-CM | POA: Diagnosis not present

## 2012-08-09 DIAGNOSIS — R000 Tachycardia, unspecified: Secondary | ICD-10-CM | POA: Diagnosis not present

## 2012-08-09 DIAGNOSIS — I10 Essential (primary) hypertension: Secondary | ICD-10-CM | POA: Diagnosis not present

## 2012-08-09 MED ORDER — HYDROCODONE-HOMATROPINE 5-1.5 MG/5ML PO SYRP: 5.0000 mL | ORAL_SOLUTION | Freq: Four times a day (QID) | ORAL | Status: DC | PRN

## 2012-08-09 MED ORDER — ALBUTEROL SULFATE HFA 108 (90 BASE) MCG/ACT IN AERS: 2.0000 | INHALATION_SPRAY | Freq: Four times a day (QID) | RESPIRATORY_TRACT | Status: DC | PRN
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
## 2012-08-09 NOTE — ED Notes (Signed)
pcp is dr Cardell Peach at [facility]

## 2012-08-09 NOTE — ED Notes (Signed)
Cough started 3/7.  Reports productive cough: sometimes dark phlegm and sometimes yellow .  Denies fever.  Reports head stuffiness today

## 2012-08-09 NOTE — ED Provider Notes (Signed)
Medical screening examination/treatment/procedure(s) were performed by non-physician practitioner and as supervising physician I was immediately available for consultation/collaboration.  Leslee Home, M.D.  Reuben Likes, MD 08/09/12 [phone]

## 2012-08-09 NOTE — ED Notes (Signed)
Pt given discharge paperwork; pt verbalized understanding of discharge and f/u; no additional questions by pt; VSS; resps e/u; e-signature obtained;
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
## 2012-08-09 NOTE — ED Provider Notes (Signed)
Complains of cough for one week productive of "black sputum"he denies pain denies fever. Admits to shortness of breath but not different from baseline. On exam patient is alert speaks in paragraphs not ill appearing lungs clear auscultation heart regular rate and rhythm. Patient is not lightheaded on standing. Not acutely ill appearing.  Doug Sou, MD 08/10/12 [phone]

## 2012-08-09 NOTE — ED Notes (Signed)
ekg equipment in use 

## 2012-08-09 NOTE — ED Notes (Signed)
Pt reports a cough for the past couple of weeks. States he went to [facility] and was transferred here for further testing. States his HR was a little high. Denies any pain at this time. Chest x-ray completed a [facility]. Also reports SOB at times.

## 2012-08-10 NOTE — ED Provider Notes (Signed)
Medical screening examination/treatment/procedure(s) were conducted as a shared visit with non-physician practitioner(s) and myself.  I personally evaluated the patient during the encounter  Doug Sou, MD 08/10/12 [phone]

## 2012-08-14 ENCOUNTER — Ambulatory Visit (INDEPENDENT_AMBULATORY_CARE_PROVIDER_SITE_OTHER): Payer: Medicare Other | Admitting: Family Medicine

## 2012-08-14 ENCOUNTER — Encounter: Payer: Self-pay | Admitting: Family Medicine

## 2012-08-14 VITALS — BP 134/88 | HR 96 | Temp 98.8°F | Ht 66.0 in | Wt 169.0 lb

## 2012-08-14 DIAGNOSIS — F329 Major depressive disorder, single episode, unspecified: Secondary | ICD-10-CM

## 2012-08-14 DIAGNOSIS — J449 Chronic obstructive pulmonary disease, unspecified: Secondary | ICD-10-CM

## 2012-08-14 DIAGNOSIS — J4489 Other specified chronic obstructive pulmonary disease: Secondary | ICD-10-CM | POA: Diagnosis not present

## 2012-08-14 DIAGNOSIS — J069 Acute upper respiratory infection, unspecified: Secondary | ICD-10-CM | POA: Diagnosis not present

## 2012-08-14 DIAGNOSIS — R000 Tachycardia, unspecified: Secondary | ICD-10-CM | POA: Diagnosis not present

## 2012-08-14 MED ORDER — AZITHROMYCIN 500 MG PO TABS: 500.0000 mg | ORAL_TABLET | Freq: Every day | ORAL | Status: DC

## 2012-08-14 MED ORDER — HYDROCODONE-ACETAMINOPHEN 5-325 MG PO TABS: 1.0000 | ORAL_TABLET | Freq: Four times a day (QID) | ORAL | Status: DC | PRN

## 2012-08-14 NOTE — Patient Instructions (Addendum)
I checked your CXR from last Friday and your CT scan from 1/21 and both look OK.   Let's try some antibiotics for the cough. See me in two weeks if you are still coughing Sorry about your mom.

## 2012-08-16 ENCOUNTER — Ambulatory Visit: Payer: Medicare Other | Admitting: Family Medicine

## 2012-08-16 NOTE — Assessment & Plan Note (Signed)
Worse with dealth of mother.  No HI or SI.  Supportive grief counseling.

## 2012-08-16 NOTE — Progress Notes (Signed)
  Subjective:    Patient ID: [name], male    DOB: [birth year], 57 y.o.   MRN: [id number]  HPI Cough with chest wall pain x 1 week.  No SOB.  No fever. Upset by recent death of his mother.  Since sibs all living in mother's home, death may necessitate relocation.      Review of Systems     Objective:   Physical Exam Lungs scattered end exp wheeze.  No rales.       Assessment & Plan:

## 2012-08-16 NOTE — Assessment & Plan Note (Signed)
Mild exacerbation.  

## 2012-09-20 ENCOUNTER — Encounter: Payer: Self-pay | Admitting: Family Medicine

## 2012-09-20 ENCOUNTER — Ambulatory Visit (INDEPENDENT_AMBULATORY_CARE_PROVIDER_SITE_OTHER): Payer: Medicare Other | Admitting: Family Medicine

## 2012-09-20 VITALS — BP 134/83 | HR 117 | Temp 97.9°F | Ht 66.0 in | Wt 171.1 lb

## 2012-09-20 DIAGNOSIS — F329 Major depressive disorder, single episode, unspecified: Secondary | ICD-10-CM

## 2012-09-20 DIAGNOSIS — F17200 Nicotine dependence, unspecified, uncomplicated: Secondary | ICD-10-CM | POA: Diagnosis not present

## 2012-09-20 DIAGNOSIS — J449 Chronic obstructive pulmonary disease, unspecified: Secondary | ICD-10-CM | POA: Diagnosis not present

## 2012-09-20 DIAGNOSIS — G4700 Insomnia, unspecified: Secondary | ICD-10-CM

## 2012-09-20 DIAGNOSIS — F3289 Other specified depressive episodes: Secondary | ICD-10-CM | POA: Diagnosis not present

## 2012-09-20 DIAGNOSIS — J069 Acute upper respiratory infection, unspecified: Secondary | ICD-10-CM | POA: Diagnosis not present

## 2012-09-20 DIAGNOSIS — R000 Tachycardia, unspecified: Secondary | ICD-10-CM | POA: Diagnosis not present

## 2012-09-20 MED ORDER — TRAZODONE HCL 150 MG PO TABS: 300.0000 mg | ORAL_TABLET | Freq: Every day | ORAL | Status: DC

## 2012-09-20 NOTE — Assessment & Plan Note (Signed)
Discussed again 

## 2012-09-20 NOTE — Progress Notes (Signed)
  Subjective:    Patient ID: [name], male    DOB: 01-24-1956, 57 y.o.   MRN: [id number]  HPI Depression not improved.  Still trouble sleeping.  Still grieving over loss of mom.  Denies HI or SI.  Still looking for new place to live.  Staying clean and sober.  Unfortunately, still smoking.    Review of Systems     Objective:   Physical Exam  Normal affect.      Assessment & Plan:

## 2012-09-20 NOTE — Assessment & Plan Note (Signed)
Try higher dose of trazodone. 

## 2012-09-20 NOTE — Patient Instructions (Signed)
Please quit smoking Try the higher dose of trazodone for sleeping, anxiety and depression.  See me in one month to let me know how you are doing. Good luck with getting your record expunged and with finding a new place to live.

## 2012-09-22 ENCOUNTER — Emergency Department (HOSPITAL_COMMUNITY): Admission: EM | Admit: 2012-09-22 | Discharge: 2012-09-22 | Discharge status: Absent without leave | Payer: Self-pay

## 2012-09-22 ENCOUNTER — Telehealth: Payer: Self-pay | Admitting: Family Medicine

## 2012-09-22 NOTE — Telephone Encounter (Signed)
Patient called to say that he has increased his Trazodone to 300 mg QHS starting Friday night, and has has subsequently slept very poorly. He believes it is related to the dose of the medication. I instructed him to go to one pill a night for 3 nights and then try to go back up to 2 pills. If he needs something else to make him tired, he was instructed to use benadryl. I encouraged him not to stop the Trazodone, and he is agreeable with this plan. Will contact his PCP with any other concerns.

## 2012-09-23 ENCOUNTER — Telehealth: Payer: Self-pay | Admitting: Family Medicine

## 2012-09-23 DIAGNOSIS — G4700 Insomnia, unspecified: Secondary | ICD-10-CM

## 2012-09-23 NOTE — Telephone Encounter (Signed)
Patient said that he got real sick Saturday night from taking the depression medicine.  He is also having trouble sleeping.  He talked to dr on call and he said to take only 1 pill instead.  He would like someone to call him concerning this.  He is not sure what he should do.

## 2012-09-24 NOTE — Assessment & Plan Note (Signed)
Trazodone 300mg  qhs was too strong for [name].  150 mg seems to be doing fine.  Told to continue.  Changed sig on med list.

## 2012-10-07 ENCOUNTER — Telehealth: Payer: Self-pay | Admitting: Medical Oncology

## 2012-10-07 ENCOUNTER — Encounter (HOSPITAL_COMMUNITY): Payer: Self-pay | Admitting: Emergency Medicine

## 2012-10-07 ENCOUNTER — Emergency Department (INDEPENDENT_AMBULATORY_CARE_PROVIDER_SITE_OTHER): Payer: Medicare Other

## 2012-10-07 ENCOUNTER — Emergency Department (INDEPENDENT_AMBULATORY_CARE_PROVIDER_SITE_OTHER)
Admission: EM | Admit: 2012-10-07 | Discharge: 2012-10-07 | Disposition: A | Discharge status: Home / Self Care | Payer: Medicare Other | Source: Home / Self Care | Attending: Emergency Medicine | Admitting: Emergency Medicine

## 2012-10-07 DIAGNOSIS — R000 Tachycardia, unspecified: Secondary | ICD-10-CM | POA: Diagnosis not present

## 2012-10-07 DIAGNOSIS — J069 Acute upper respiratory infection, unspecified: Secondary | ICD-10-CM

## 2012-10-07 DIAGNOSIS — R05 Cough: Secondary | ICD-10-CM | POA: Diagnosis not present

## 2012-10-07 DIAGNOSIS — J449 Chronic obstructive pulmonary disease, unspecified: Secondary | ICD-10-CM

## 2012-10-07 DIAGNOSIS — R918 Other nonspecific abnormal finding of lung field: Secondary | ICD-10-CM | POA: Diagnosis not present

## 2012-10-07 MED ORDER — DOXYCYCLINE HYCLATE 100 MG PO CAPS: 100.0000 mg | ORAL_CAPSULE | Freq: Two times a day (BID) | ORAL | Status: DC

## 2012-10-07 MED ORDER — BENZONATATE 100 MG PO CAPS: 100.0000 mg | ORAL_CAPSULE | Freq: Three times a day (TID) | ORAL | Status: DC | PRN

## 2012-10-07 NOTE — Telephone Encounter (Signed)
Pt had cxr today and is being treated for pneumonia. He just wanted Dr Arbutus Ped to be notified. I left pt a message to follow up at urgent care if he doesn't get better after his course of antibiotics.
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
## 2012-10-07 NOTE — ED Notes (Signed)
Pt c/o persistent cough onset 4 weeks; hx of lung cancer; smokes 0.5 PPD Sx include: productive cough, sometimes brown and sometimes clear, runny nose Denies: f/n/d Takes inhalers and nasal spray that he does not know the name of.  He is alert and oriented w/no signs of acute distress.

## 2012-10-08 ENCOUNTER — Telehealth: Payer: Self-pay | Admitting: *Deleted

## 2012-10-08 NOTE — Telephone Encounter (Signed)
Pt called wanting Dr Donnald Garre to review CXR to make sure he did not have any changes in his disease.  Per Dr Donnald Garre, the CXR does not appear to show any changes in his disease and to keep all appts as scheduled.  Pt verbalized understanding.  SLJ

## 2012-10-23 ENCOUNTER — Ambulatory Visit (INDEPENDENT_AMBULATORY_CARE_PROVIDER_SITE_OTHER): Payer: Medicare Other | Admitting: Family Medicine

## 2012-10-23 ENCOUNTER — Encounter: Payer: Self-pay | Admitting: Family Medicine

## 2012-10-23 VITALS — BP 117/79 | HR 98 | Temp 98.0°F | Ht 66.0 in | Wt 165.0 lb

## 2012-10-23 DIAGNOSIS — R918 Other nonspecific abnormal finding of lung field: Secondary | ICD-10-CM | POA: Diagnosis not present

## 2012-10-23 DIAGNOSIS — J069 Acute upper respiratory infection, unspecified: Secondary | ICD-10-CM | POA: Diagnosis not present

## 2012-10-23 DIAGNOSIS — F329 Major depressive disorder, single episode, unspecified: Secondary | ICD-10-CM | POA: Diagnosis not present

## 2012-10-23 DIAGNOSIS — G4700 Insomnia, unspecified: Secondary | ICD-10-CM | POA: Diagnosis not present

## 2012-10-23 DIAGNOSIS — R7309 Other abnormal glucose: Secondary | ICD-10-CM

## 2012-10-23 DIAGNOSIS — R000 Tachycardia, unspecified: Secondary | ICD-10-CM | POA: Diagnosis not present

## 2012-10-23 DIAGNOSIS — R634 Abnormal weight loss: Secondary | ICD-10-CM

## 2012-10-23 DIAGNOSIS — R059 Cough, unspecified: Secondary | ICD-10-CM | POA: Diagnosis not present

## 2012-10-23 DIAGNOSIS — F17200 Nicotine dependence, unspecified, uncomplicated: Secondary | ICD-10-CM | POA: Diagnosis not present

## 2012-10-23 DIAGNOSIS — R739 Hyperglycemia, unspecified: Secondary | ICD-10-CM

## 2012-10-23 DIAGNOSIS — J449 Chronic obstructive pulmonary disease, unspecified: Secondary | ICD-10-CM | POA: Diagnosis not present

## 2012-10-23 LAB — POCT URINALYSIS DIPSTICK
Spec Grav, UA: 1.02
Urobilinogen, UA: 1
pH, UA: 6.5

## 2012-10-23 LAB — COMPREHENSIVE METABOLIC PANEL
ALT: 22 U/L (ref 0–53)
AST: 26 U/L (ref 0–37)
Albumin: 4.1 g/dL (ref 3.5–5.2)
BUN: 11 mg/dL (ref 6–23)
CO2: 25 mEq/L (ref 19–32)
Calcium: 9.8 mg/dL (ref 8.4–10.5)
Chloride: 101 mEq/L (ref 96–112)
Potassium: 4.7 mEq/L (ref 3.5–5.3)

## 2012-10-23 LAB — CBC WITH DIFFERENTIAL/PLATELET
Basophils Absolute: 0 10*3/uL (ref 0.0–0.1)
Eosinophils Relative: 5 % (ref 0–5)
HCT: 41.5 % (ref 39.0–52.0)
Hemoglobin: 14.4 g/dL (ref 13.0–17.0)
Lymphocytes Relative: 36 % (ref 12–46)
Lymphs Abs: 2.6 10*3/uL (ref 0.7–4.0)
MCV: 90.6 fL (ref 78.0–100.0)
Monocytes Absolute: 0.6 10*3/uL (ref 0.1–1.0)
Neutro Abs: 3.6 10*3/uL (ref 1.7–7.7)
RBC: 4.58 MIL/uL (ref 4.22–5.81)
RDW: 13.9 % (ref 11.5–15.5)
WBC: 7.3 10*3/uL (ref 4.0–10.5)

## 2012-10-23 LAB — TSH: TSH: 1.796 u[IU]/mL (ref 0.350–4.500)

## 2012-10-23 NOTE — Assessment & Plan Note (Signed)
Very broad differential given his history of cancer and active smoking. Urinalysis does not show sign of infection. Will check lab work, TSH, A1c, noted history of elevated fasting glucose in the last 2 years. New back pain in the setting of weight loss would indicate red flag. Will check lumbar plain film. No neurologic signs to warrant further imaging at this time. The differential could include recurrent malignancy, worsening of COPD, secondary to mood disorder, or endocrine. Will obtain the labs and follow up result. Also advised patient to followup in 2-4 weeks with his PCP for chronic management.

## 2012-10-23 NOTE — Progress Notes (Signed)
  Subjective:    Patient ID: [name], male    DOB: 03/15/1956, 57 y.o.   MRN: [id number]  HPI  1. Weight loss. 57 year old male with a history of lung cancer treated in 2006, and active tobacco use presents with unintentional weight loss. He states in the past one month he has felt his pants become looser. Now requiring the lowest setting on his belt. He does note 6 pound weight loss since previous visit. States his appetite is stable, denies any GI symptoms including abdominal pain, nausea, vomiting, diarrhea, blood in stool. The only new medication is trazodone which was treated last month. He continues to complain of a productive cough with phlegm intermittently. Has not worsened. New symptoms also include some moderate to severe right low back pain and chills at night. States he never had any back pain previously to this. Again approximately 4-6 weeks ago. Pain is worse at night, rated 8/10. Feels like an aching. Does not radiate to the legs. He also endorses some chronic polyuria and polydipsia. He awakens in the night to drink water.  He plans to follow up in December with his oncologist for her yearly check. He continues to smoke daily.  Review of Systems See HPI otherwise negative.  reports that he has been smoking Cigarettes.  He has a 45 pack-year smoking history. He has never used smokeless tobacco.     Objective:   Physical Exam  Vitals reviewed. Constitutional: He is oriented to person, place, and time. He appears well-developed and well-nourished. No distress.  Appears slightly older than stated age  HENT:  Head: Normocephalic and atraumatic.  Nose: Nose normal.  Mouth/Throat: Oropharynx is clear and moist. No oropharyngeal exudate.  Lips somewhat dry  Eyes: EOM are normal. Pupils are equal, round, and reactive to light.  Cardiovascular: Normal rate, regular rhythm and normal heart sounds.  Exam reveals no gallop.   No murmur heard. Pulmonary/Chest: Effort normal. No  respiratory distress. He has no wheezes.  Sounds slightly diminished at right mid lung field.  Abdominal: Soft. Bowel sounds are normal. He exhibits no distension. There is no tenderness. There is no rebound and no guarding.  Musculoskeletal: He exhibits no edema and no tenderness.  Normal gait. Lumbar range of motion intact. There is moderate tenderness to palpation in the right posterior superior iliac spine region SI. Negative straight leg test. No masses or rashes noted.    Neurological: He is alert and oriented to person, place, and time.  Skin: No rash noted. He is not diaphoretic.  Psychiatric: He has a normal mood and affect.        Assessment & Plan:

## 2012-10-23 NOTE — Patient Instructions (Signed)
Will check some lab work to see if any cause of your weight loss. Get your back xray done. Make an appointment with Dr. Leveda Anna in 2 weeks.  If you have any fever, shortness of breath or blood in sputum then call the doctor.

## 2012-10-24 ENCOUNTER — Ambulatory Visit (HOSPITAL_COMMUNITY)
Admission: RE | Admit: 2012-10-24 | Discharge: 2012-10-24 | Disposition: A | Discharge status: Home / Self Care | Payer: Medicare Other | Source: Ambulatory Visit | Attending: Family Medicine | Admitting: Family Medicine

## 2012-10-24 ENCOUNTER — Telehealth: Payer: Self-pay | Admitting: Family Medicine

## 2012-10-24 DIAGNOSIS — M545 Low back pain, unspecified: Secondary | ICD-10-CM | POA: Diagnosis not present

## 2012-10-24 DIAGNOSIS — R634 Abnormal weight loss: Secondary | ICD-10-CM | POA: Insufficient documentation

## 2012-10-24 NOTE — Telephone Encounter (Signed)
Spoke with patient about normal results, xray and labs. Advised he try a boost shake before lunch or dinner. He agrees to f/u in 2-3 weeks for recheck.

## 2012-10-30 ENCOUNTER — Ambulatory Visit: Payer: Medicare Other | Admitting: Family Medicine

## 2012-11-08 ENCOUNTER — Telehealth: Payer: Self-pay | Admitting: Medical Oncology

## 2012-11-08 NOTE — Telephone Encounter (Signed)
REQUESTS REFERRAL TO ONCOLOGY IN [place] BECAUSE HE IS MOVING THERE. NOTE TO DR [facility].

## 2012-11-11 ENCOUNTER — Telehealth: Payer: Self-pay | Admitting: Medical Oncology

## 2012-11-11 NOTE — Telephone Encounter (Signed)
Pt left message that he is moving to [place] , not [place] and I returned pts call and left message that Dr Arbutus Ped can refer him to [facility] or he can stay here and to let me know what he wants to do.

## 2012-11-14 ENCOUNTER — Telehealth: Payer: Self-pay | Admitting: *Deleted

## 2012-11-14 NOTE — Telephone Encounter (Signed)
[facility] is requesting diagnostic reports from 2006-present.  Request sent to the medical records dept.  Records need to be faxed to [phone].  Debe Coder.  SLJ

## 2012-11-19 ENCOUNTER — Telehealth: Payer: Self-pay | Admitting: Internal Medicine

## 2012-11-19 NOTE — Telephone Encounter (Signed)
Pt appt. To see Dr. Melvyn Neth @ [facility] is 06/02/13@1 :15.  Britta Mccreedy from [facility] [place] will contact pt will appt.

## 2012-11-20 ENCOUNTER — Ambulatory Visit (INDEPENDENT_AMBULATORY_CARE_PROVIDER_SITE_OTHER): Payer: Medicare Other | Admitting: Family Medicine

## 2012-11-20 VITALS — BP 122/78 | Ht 66.0 in | Wt 162.0 lb

## 2012-11-20 DIAGNOSIS — F329 Major depressive disorder, single episode, unspecified: Secondary | ICD-10-CM

## 2012-11-20 DIAGNOSIS — R059 Cough, unspecified: Secondary | ICD-10-CM | POA: Diagnosis not present

## 2012-11-20 DIAGNOSIS — J069 Acute upper respiratory infection, unspecified: Secondary | ICD-10-CM | POA: Diagnosis not present

## 2012-11-20 DIAGNOSIS — R634 Abnormal weight loss: Secondary | ICD-10-CM | POA: Diagnosis not present

## 2012-11-20 DIAGNOSIS — J449 Chronic obstructive pulmonary disease, unspecified: Secondary | ICD-10-CM | POA: Diagnosis not present

## 2012-11-20 DIAGNOSIS — R7309 Other abnormal glucose: Secondary | ICD-10-CM | POA: Diagnosis not present

## 2012-11-20 DIAGNOSIS — F17200 Nicotine dependence, unspecified, uncomplicated: Secondary | ICD-10-CM | POA: Diagnosis not present

## 2012-11-20 DIAGNOSIS — R918 Other nonspecific abnormal finding of lung field: Secondary | ICD-10-CM | POA: Diagnosis not present

## 2012-11-20 DIAGNOSIS — G4700 Insomnia, unspecified: Secondary | ICD-10-CM | POA: Diagnosis not present

## 2012-11-20 DIAGNOSIS — J4489 Other specified chronic obstructive pulmonary disease: Secondary | ICD-10-CM | POA: Diagnosis not present

## 2012-11-20 DIAGNOSIS — R000 Tachycardia, unspecified: Secondary | ICD-10-CM | POA: Diagnosis not present

## 2012-11-20 MED ORDER — ALBUTEROL SULFATE HFA 108 (90 BASE) MCG/ACT IN AERS: 2.0000 | INHALATION_SPRAY | Freq: Four times a day (QID) | RESPIRATORY_TRACT | Status: DC | PRN

## 2012-11-20 NOTE — Patient Instructions (Addendum)
See me in one month to recheck your weight. I miss your mom and I know you do too. Good luck to you and [name] and perhaps your move to [place]. I will be happy to refill your medications as you need them. I hope things work out for [employer]

## 2012-11-21 ENCOUNTER — Encounter: Payer: Self-pay | Admitting: Family Medicine

## 2012-11-21 NOTE — Assessment & Plan Note (Signed)
Stable, refill albuterol.

## 2012-11-21 NOTE — Assessment & Plan Note (Signed)
Observe for now.  Recheck in one month.

## 2012-11-21 NOTE — Progress Notes (Signed)
  Subjective:    Patient ID: [name], male    DOB: May 24, 1956, 57 y.o.   MRN: [id number]  HPI Generally doing well.  Trazodone helps with depression and chronic insomnia.  Still grieving over death of his mother.  He and sister are likely moving to [facility] for financial reasons.  His only current concern is unintentional wt loss.  Has had considerable recent blood work which I reviewed.  He has also seen his oncologist and there is no evidence of recurrent disease.  Appetite is good and no bowel changes.     Review of Systems     Objective:   Physical Exam VS and wt trends noted. Lungs clear Abd benign.       Assessment & Plan:

## 2012-11-21 NOTE — Assessment & Plan Note (Signed)
Well controled considering superimposed grief and likely move ahead.

## 2012-11-25 ENCOUNTER — Telehealth: Payer: Self-pay | Admitting: *Deleted

## 2012-11-25 NOTE — Telephone Encounter (Signed)
Pt called, he stated he is no longer going to be moving down to [place] and will keep his appt as originally scheduled in January of 2015.  SLJ

## 2012-11-26 ENCOUNTER — Other Ambulatory Visit: Payer: Self-pay | Admitting: Family Medicine

## 2012-12-19 ENCOUNTER — Encounter (HOSPITAL_COMMUNITY): Payer: Self-pay | Admitting: Emergency Medicine

## 2012-12-19 ENCOUNTER — Emergency Department (INDEPENDENT_AMBULATORY_CARE_PROVIDER_SITE_OTHER)
Admission: EM | Admit: 2012-12-19 | Discharge: 2012-12-19 | Disposition: A | Discharge status: Home / Self Care | Payer: Medicare Other | Source: Home / Self Care

## 2012-12-19 DIAGNOSIS — M79609 Pain in unspecified limb: Secondary | ICD-10-CM

## 2012-12-19 DIAGNOSIS — M25549 Pain in joints of unspecified hand: Secondary | ICD-10-CM

## 2012-12-19 DIAGNOSIS — M79641 Pain in right hand: Secondary | ICD-10-CM

## 2012-12-19 MED ORDER — KETOROLAC TROMETHAMINE 10 MG PO TABS: 10.0000 mg | ORAL_TABLET | Freq: Four times a day (QID) | ORAL | Status: DC | PRN

## 2012-12-19 MED ORDER — KETOROLAC TROMETHAMINE 30 MG/ML IJ SOLN
30.0000 mg | Freq: Once | INTRAMUSCULAR | Status: AC
  Administered 2012-12-19: 30 mg via INTRAMUSCULAR

## 2012-12-19 MED ORDER — KETOROLAC TROMETHAMINE 30 MG/ML IJ SOLN
INTRAMUSCULAR | Status: AC
  Filled 2012-12-19: qty 1
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
## 2012-12-20 ENCOUNTER — Encounter: Payer: Self-pay | Admitting: Family Medicine

## 2012-12-20 ENCOUNTER — Ambulatory Visit (INDEPENDENT_AMBULATORY_CARE_PROVIDER_SITE_OTHER): Payer: Medicare Other | Admitting: Family Medicine

## 2012-12-20 VITALS — BP 103/68 | HR 108 | Temp 98.0°F | Ht 66.0 in | Wt 163.0 lb

## 2012-12-20 DIAGNOSIS — M79609 Pain in unspecified limb: Secondary | ICD-10-CM | POA: Diagnosis not present

## 2012-12-20 DIAGNOSIS — M25579 Pain in unspecified ankle and joints of unspecified foot: Secondary | ICD-10-CM | POA: Diagnosis not present

## 2012-12-20 DIAGNOSIS — M25571 Pain in right ankle and joints of right foot: Secondary | ICD-10-CM

## 2012-12-20 DIAGNOSIS — M25549 Pain in joints of unspecified hand: Secondary | ICD-10-CM | POA: Diagnosis not present

## 2012-12-20 DIAGNOSIS — M79641 Pain in right hand: Secondary | ICD-10-CM | POA: Insufficient documentation

## 2012-12-20 MED ORDER — MELOXICAM 15 MG PO TABS: 15.0000 mg | ORAL_TABLET | Freq: Every day | ORAL | Status: DC | PRN
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
## 2012-12-22 DIAGNOSIS — M25571 Pain in right ankle and joints of right foot: Secondary | ICD-10-CM | POA: Insufficient documentation

## 2012-12-22 NOTE — Assessment & Plan Note (Signed)
See right ankle pain

## 2012-12-22 NOTE — Assessment & Plan Note (Addendum)
Given traumatic history in ankle and hand joints, suspect that pain may be related to arthritic changes. Prescribed meloxicam for pain. To return to care in 2-3 weeks if not improved.

## 2012-12-26 ENCOUNTER — Ambulatory Visit (HOSPITAL_COMMUNITY)
Admission: RE | Admit: 2012-12-26 | Discharge: 2012-12-26 | Disposition: A | Discharge status: Home / Self Care | Payer: Medicare Other | Source: Ambulatory Visit | Attending: Family Medicine | Admitting: Family Medicine

## 2012-12-26 ENCOUNTER — Encounter: Payer: Self-pay | Admitting: Family Medicine

## 2012-12-26 ENCOUNTER — Ambulatory Visit (INDEPENDENT_AMBULATORY_CARE_PROVIDER_SITE_OTHER): Payer: Medicare Other | Admitting: Family Medicine

## 2012-12-26 VITALS — BP 133/84 | HR 101 | Temp 97.9°F | Ht 66.0 in | Wt 164.0 lb

## 2012-12-26 DIAGNOSIS — K5900 Constipation, unspecified: Secondary | ICD-10-CM | POA: Diagnosis not present

## 2012-12-26 DIAGNOSIS — R042 Hemoptysis: Secondary | ICD-10-CM | POA: Diagnosis not present

## 2012-12-26 DIAGNOSIS — R358 Other polyuria: Secondary | ICD-10-CM | POA: Diagnosis not present

## 2012-12-26 DIAGNOSIS — M25549 Pain in joints of unspecified hand: Secondary | ICD-10-CM | POA: Diagnosis not present

## 2012-12-26 DIAGNOSIS — R32 Unspecified urinary incontinence: Secondary | ICD-10-CM | POA: Diagnosis not present

## 2012-12-26 DIAGNOSIS — M79609 Pain in unspecified limb: Secondary | ICD-10-CM | POA: Diagnosis not present

## 2012-12-26 DIAGNOSIS — M25579 Pain in unspecified ankle and joints of unspecified foot: Secondary | ICD-10-CM | POA: Diagnosis not present

## 2012-12-26 DIAGNOSIS — J438 Other emphysema: Secondary | ICD-10-CM | POA: Diagnosis not present

## 2012-12-26 DIAGNOSIS — R3589 Other polyuria: Secondary | ICD-10-CM | POA: Diagnosis not present

## 2012-12-26 HISTORY — DX: Unspecified urinary incontinence: R32

## 2012-12-26 LAB — POCT URINALYSIS DIPSTICK
Blood, UA: NEGATIVE
Glucose, UA: NEGATIVE
Ketones, UA: NEGATIVE
Spec Grav, UA: 1.025

## 2012-12-26 MED ORDER — POLYETHYLENE GLYCOL 3350 17 GM/SCOOP PO POWD: 17.0000 g | Freq: Every day | ORAL | Status: DC

## 2012-12-26 MED ORDER — TAMSULOSIN HCL 0.4 MG PO CAPS: 0.4000 mg | ORAL_CAPSULE | Freq: Every day | ORAL | Status: DC

## 2012-12-26 NOTE — Patient Instructions (Signed)
Nice to see you again. I think your urine issues may be due to your prostate or constipation. Please try the medication flomax to help with urination. Also take the miralax every day until having regular bowel movements. Please go to the radiology department for a chest X-ray. We will call you with the results.  We will have you follow-up in a week with Dr. Leveda Anna.

## 2012-12-27 NOTE — Assessment & Plan Note (Signed)
Patient with intermittent hemoptysis in the post lung CA and radiation setting. Concern would be for recurrence of cancer. Will order CXR now to evaluate for acute issues. Patient overall appears well and has not been having any respiratory or cardiac difficulties other than small amount of intermittent hemoptysis, so will ask that patient returns to see PCP some time in the next week.

## 2012-12-27 NOTE — Assessment & Plan Note (Signed)
Patient with mild enlargement of prostate and what sounds like urgency incontinence. May be multifactorial cause of incontinence. Will give trial of flomax. Patient also with constipation that could affect incontinence. Will give miralax. To f/u in the next week or so to see how this is progressing.
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
## 2012-12-27 NOTE — Assessment & Plan Note (Signed)
Miralax daily until BMs soft and consistent.

## 2012-12-30 ENCOUNTER — Telehealth: Payer: Self-pay | Admitting: Family Medicine

## 2012-12-30 NOTE — Telephone Encounter (Signed)
Pt called wanting Dr. Leveda Anna to call him with his results from the xrays he had done on 7/31 JW

## 2012-12-30 NOTE — Telephone Encounter (Signed)
Called and informed CXR normal.  Last lung CT for monitoring treated lung cancer was 05/2012.  Informed will need repeat CT if hemoptysis persists.  Has follow up appointment with me in one week.

## 2012-12-30 NOTE — Telephone Encounter (Signed)
Will forward to Dr Leveda Anna

## 2013-01-01 ENCOUNTER — Telehealth: Payer: Self-pay | Admitting: Medical Oncology

## 2013-01-01 NOTE — Telephone Encounter (Signed)
Requests wellbutrin because he is serious about quitting smoking. Note to dr Arbutus Ped.

## 2013-01-02 ENCOUNTER — Telehealth: Payer: Self-pay | Admitting: Medical Oncology

## 2013-01-02 ENCOUNTER — Telehealth: Payer: Self-pay | Admitting: Family Medicine

## 2013-01-02 DIAGNOSIS — F17200 Nicotine dependence, unspecified, uncomplicated: Secondary | ICD-10-CM

## 2013-01-02 MED ORDER — BUPROPION HCL ER (XL) 300 MG PO TB24: 300.0000 mg | ORAL_TABLET | Freq: Every day | ORAL | Status: DC

## 2013-01-02 NOTE — Telephone Encounter (Signed)
Called pt about wellbutrin instructions.

## 2013-01-02 NOTE — Assessment & Plan Note (Signed)
OK to rx.  Called patient.

## 2013-01-02 NOTE — Telephone Encounter (Signed)
[name] is requesting a rx for the Wellbutin to help him quit the urge to smoke.  He says he really want to quit this time.

## 2013-01-02 NOTE — Telephone Encounter (Signed)
Message copied by Charma Igo on Thu Jan 02, 2013  1:02 PM ------      Message from: Si Gaul      Created: Wed Jan 01, 2013 10:12 PM       He can get it from PCP       ----- Message -----         From: Charma Igo, RN         Sent: 01/01/2013  10:51 AM           To: Si Gaul, MD            Requests wellbutrin because he is serious about quitting smoking             ------

## 2013-01-08 ENCOUNTER — Encounter: Payer: Self-pay | Admitting: Family Medicine

## 2013-01-08 ENCOUNTER — Ambulatory Visit (INDEPENDENT_AMBULATORY_CARE_PROVIDER_SITE_OTHER): Payer: Medicare Other | Admitting: Family Medicine

## 2013-01-08 VITALS — BP 138/86 | HR 90 | Temp 97.9°F | Ht 66.0 in | Wt 164.2 lb

## 2013-01-08 DIAGNOSIS — Z87891 Personal history of nicotine dependence: Secondary | ICD-10-CM | POA: Diagnosis not present

## 2013-01-08 DIAGNOSIS — R3589 Other polyuria: Secondary | ICD-10-CM | POA: Diagnosis not present

## 2013-01-08 DIAGNOSIS — R042 Hemoptysis: Secondary | ICD-10-CM | POA: Diagnosis not present

## 2013-01-08 DIAGNOSIS — M25579 Pain in unspecified ankle and joints of unspecified foot: Secondary | ICD-10-CM | POA: Diagnosis not present

## 2013-01-08 DIAGNOSIS — K5900 Constipation, unspecified: Secondary | ICD-10-CM | POA: Diagnosis not present

## 2013-01-08 DIAGNOSIS — G4700 Insomnia, unspecified: Secondary | ICD-10-CM | POA: Diagnosis not present

## 2013-01-08 DIAGNOSIS — R32 Unspecified urinary incontinence: Secondary | ICD-10-CM | POA: Diagnosis not present

## 2013-01-08 DIAGNOSIS — I10 Essential (primary) hypertension: Secondary | ICD-10-CM | POA: Diagnosis not present

## 2013-01-08 DIAGNOSIS — M79609 Pain in unspecified limb: Secondary | ICD-10-CM | POA: Diagnosis not present

## 2013-01-08 DIAGNOSIS — M25549 Pain in joints of unspecified hand: Secondary | ICD-10-CM | POA: Diagnosis not present

## 2013-01-08 MED ORDER — TAMSULOSIN HCL 0.4 MG PO CAPS: 0.4000 mg | ORAL_CAPSULE | Freq: Every day | ORAL | Status: DC

## 2013-01-08 NOTE — Patient Instructions (Addendum)
Stop the meloxicam.  It is not any better or any different than the ibuprofen Stay on the tamsulosin to help you pee.   No other medicine changes. Good job on stopping smoking - stay quit. You can stop the trazodone if it doesn't help you sleep. Call me tomorrow if you are still feeling weird, I may do more tests.   Try a cane in your left hand to help your bad right ankle.

## 2013-01-09 NOTE — Assessment & Plan Note (Signed)
Not overtreated.

## 2013-01-09 NOTE — Assessment & Plan Note (Signed)
Denies further hemoptosis since last visit.  CXR normal.

## 2013-01-09 NOTE — Assessment & Plan Note (Signed)
Quit 01/05/13

## 2013-01-09 NOTE — Progress Notes (Signed)
  Subjective:    Patient ID: [name], male    DOB: [birth year], 57 y.o.   MRN: [id number]  HPI  Feels bad just today.  A little fuzzy headed.  Feels like he may have a little imbalance.  Was fine yesterday.  Here for regular check in.  I am pleased that he once again quit smoking three days ago.  States he is taking all his meds.  Of note, he just started taking wellbutrin again.   Fu of urinary problems.  Did not feel he was completely emptying bladder.  No improved on flomax.  No recent incontinence.    Review of Systems     Objective:   Physical ExamLungs clear Cardiac RRR without m or g Abd benign Neuro motor and sensory grossly intact.  No focal signs or symptoms.        Assessment & Plan:

## 2013-01-09 NOTE — Assessment & Plan Note (Signed)
Unclear whether trazodone helping.

## 2013-01-09 NOTE — Assessment & Plan Note (Signed)
Likely from early BPH.  Responding to flomax.

## 2013-02-10 ENCOUNTER — Other Ambulatory Visit: Payer: Self-pay | Admitting: Family Medicine

## 2013-02-11 ENCOUNTER — Ambulatory Visit (INDEPENDENT_AMBULATORY_CARE_PROVIDER_SITE_OTHER): Payer: Medicare Other | Admitting: *Deleted

## 2013-02-11 DIAGNOSIS — I10 Essential (primary) hypertension: Secondary | ICD-10-CM | POA: Diagnosis not present

## 2013-02-11 DIAGNOSIS — Z85118 Personal history of other malignant neoplasm of bronchus and lung: Secondary | ICD-10-CM | POA: Diagnosis not present

## 2013-02-11 DIAGNOSIS — M25549 Pain in joints of unspecified hand: Secondary | ICD-10-CM | POA: Diagnosis not present

## 2013-02-11 DIAGNOSIS — Z23 Encounter for immunization: Secondary | ICD-10-CM

## 2013-02-11 DIAGNOSIS — R3589 Other polyuria: Secondary | ICD-10-CM | POA: Diagnosis not present

## 2013-02-11 DIAGNOSIS — F17200 Nicotine dependence, unspecified, uncomplicated: Secondary | ICD-10-CM | POA: Diagnosis not present

## 2013-02-11 DIAGNOSIS — N320 Bladder-neck obstruction: Secondary | ICD-10-CM | POA: Diagnosis not present

## 2013-02-11 DIAGNOSIS — M25579 Pain in unspecified ankle and joints of unspecified foot: Secondary | ICD-10-CM | POA: Diagnosis not present

## 2013-02-11 DIAGNOSIS — Z87891 Personal history of nicotine dependence: Secondary | ICD-10-CM | POA: Diagnosis not present

## 2013-02-11 DIAGNOSIS — R042 Hemoptysis: Secondary | ICD-10-CM | POA: Diagnosis not present

## 2013-02-11 DIAGNOSIS — R32 Unspecified urinary incontinence: Secondary | ICD-10-CM | POA: Diagnosis not present

## 2013-02-11 DIAGNOSIS — G4700 Insomnia, unspecified: Secondary | ICD-10-CM | POA: Diagnosis not present

## 2013-02-11 DIAGNOSIS — M79609 Pain in unspecified limb: Secondary | ICD-10-CM | POA: Diagnosis not present

## 2013-02-11 DIAGNOSIS — K5900 Constipation, unspecified: Secondary | ICD-10-CM | POA: Diagnosis not present

## 2013-02-11 DIAGNOSIS — R300 Dysuria: Secondary | ICD-10-CM | POA: Diagnosis not present

## 2013-03-05 ENCOUNTER — Ambulatory Visit (INDEPENDENT_AMBULATORY_CARE_PROVIDER_SITE_OTHER): Payer: Medicare Other | Admitting: Family Medicine

## 2013-03-05 ENCOUNTER — Encounter: Payer: Self-pay | Admitting: Family Medicine

## 2013-03-05 VITALS — BP 113/74 | HR 104 | Temp 97.9°F | Ht 66.0 in | Wt 157.0 lb

## 2013-03-05 DIAGNOSIS — R300 Dysuria: Secondary | ICD-10-CM

## 2013-03-05 DIAGNOSIS — R3589 Other polyuria: Secondary | ICD-10-CM | POA: Diagnosis not present

## 2013-03-05 DIAGNOSIS — M25579 Pain in unspecified ankle and joints of unspecified foot: Secondary | ICD-10-CM | POA: Diagnosis not present

## 2013-03-05 DIAGNOSIS — M25549 Pain in joints of unspecified hand: Secondary | ICD-10-CM | POA: Diagnosis not present

## 2013-03-05 DIAGNOSIS — M79609 Pain in unspecified limb: Secondary | ICD-10-CM | POA: Diagnosis not present

## 2013-03-05 DIAGNOSIS — R32 Unspecified urinary incontinence: Secondary | ICD-10-CM | POA: Diagnosis not present

## 2013-03-05 DIAGNOSIS — K5900 Constipation, unspecified: Secondary | ICD-10-CM | POA: Diagnosis not present

## 2013-03-05 DIAGNOSIS — G4700 Insomnia, unspecified: Secondary | ICD-10-CM | POA: Diagnosis not present

## 2013-03-05 DIAGNOSIS — I10 Essential (primary) hypertension: Secondary | ICD-10-CM | POA: Diagnosis not present

## 2013-03-05 DIAGNOSIS — Z87891 Personal history of nicotine dependence: Secondary | ICD-10-CM | POA: Diagnosis not present

## 2013-03-05 DIAGNOSIS — R042 Hemoptysis: Secondary | ICD-10-CM | POA: Diagnosis not present

## 2013-03-05 DIAGNOSIS — N320 Bladder-neck obstruction: Secondary | ICD-10-CM | POA: Diagnosis not present

## 2013-03-05 LAB — POCT URINALYSIS DIPSTICK
Glucose, UA: NEGATIVE
Ketones, UA: NEGATIVE
Spec Grav, UA: 1.025
Urobilinogen, UA: 1

## 2013-03-05 MED ORDER — CIPROFLOXACIN HCL 500 MG PO TABS: 500.0000 mg | ORAL_TABLET | Freq: Two times a day (BID) | ORAL | Status: DC

## 2013-03-05 NOTE — Patient Instructions (Addendum)
Your problem seems to be in your prostate.  For now, I am going to treat you for infection to see if that helps.  Stay on the tamsulosin once a day while you are on the antibiotic.   If the antibiotic fixes the problem, I still want you to stay on the tamsulosin once a day.  If after three weeks of antibiotics you still have the problem, I want you to increase the tamsulosin to two pills a day.  Call me in three weeks to double check the plan. I will call with blood work results.

## 2013-03-06 LAB — PSA: PSA: 0.93 ng/mL (ref <=4.00)

## 2013-03-06 NOTE — Progress Notes (Signed)
  Subjective:    Patient ID: [name], male    DOB: 04-24-1956, 57 y.o.   MRN: [id number]  HPI C/O urinary frequency, decreased size of stream.  No burning or fever.  He has been helped some by flomax.  No recent PSA    Review of Systems     Objective:   Physical Exam Minimal suprapubic tenderness.  No obvious bladder distention by percussion. Rectal, prostate is mildly tender. Mild prostate enlargement, no nodules.        Assessment & Plan:

## 2013-03-06 NOTE — Assessment & Plan Note (Addendum)
Symptoms consistent with bladder outlet obstruction.  UA clean.  Given prostate tenderness, I wonder if he has an element of chronic prostatitis in addition to his central BPH.  Trial of cipro.  Also, because he is symptomatic, will check PSA

## 2013-04-29 ENCOUNTER — Other Ambulatory Visit: Payer: Self-pay | Admitting: Family Medicine

## 2013-06-20 ENCOUNTER — Other Ambulatory Visit (HOSPITAL_BASED_OUTPATIENT_CLINIC_OR_DEPARTMENT_OTHER): Payer: Medicare Other

## 2013-06-20 ENCOUNTER — Ambulatory Visit (HOSPITAL_COMMUNITY)
Admission: RE | Admit: 2013-06-20 | Discharge: 2013-06-20 | Disposition: A | Discharge status: Home / Self Care | Payer: Medicare Other | Source: Ambulatory Visit | Attending: Internal Medicine | Admitting: Internal Medicine

## 2013-06-20 DIAGNOSIS — I700 Atherosclerosis of aorta: Secondary | ICD-10-CM | POA: Insufficient documentation

## 2013-06-20 DIAGNOSIS — Z85118 Personal history of other malignant neoplasm of bronchus and lung: Secondary | ICD-10-CM | POA: Diagnosis not present

## 2013-06-20 DIAGNOSIS — C3490 Malignant neoplasm of unspecified part of unspecified bronchus or lung: Secondary | ICD-10-CM | POA: Insufficient documentation

## 2013-06-20 DIAGNOSIS — Z9221 Personal history of antineoplastic chemotherapy: Secondary | ICD-10-CM | POA: Insufficient documentation

## 2013-06-20 DIAGNOSIS — N320 Bladder-neck obstruction: Secondary | ICD-10-CM | POA: Diagnosis not present

## 2013-06-20 DIAGNOSIS — M25549 Pain in joints of unspecified hand: Secondary | ICD-10-CM | POA: Diagnosis not present

## 2013-06-20 DIAGNOSIS — G4700 Insomnia, unspecified: Secondary | ICD-10-CM | POA: Diagnosis not present

## 2013-06-20 DIAGNOSIS — J984 Other disorders of lung: Secondary | ICD-10-CM | POA: Diagnosis not present

## 2013-06-20 DIAGNOSIS — Z9089 Acquired absence of other organs: Secondary | ICD-10-CM | POA: Insufficient documentation

## 2013-06-20 DIAGNOSIS — M25579 Pain in unspecified ankle and joints of unspecified foot: Secondary | ICD-10-CM | POA: Diagnosis not present

## 2013-06-20 DIAGNOSIS — I2510 Atherosclerotic heart disease of native coronary artery without angina pectoris: Secondary | ICD-10-CM | POA: Diagnosis not present

## 2013-06-20 DIAGNOSIS — Z923 Personal history of irradiation: Secondary | ICD-10-CM | POA: Insufficient documentation

## 2013-06-20 DIAGNOSIS — N200 Calculus of kidney: Secondary | ICD-10-CM | POA: Insufficient documentation

## 2013-06-20 DIAGNOSIS — R300 Dysuria: Secondary | ICD-10-CM | POA: Diagnosis not present

## 2013-06-20 DIAGNOSIS — R32 Unspecified urinary incontinence: Secondary | ICD-10-CM | POA: Diagnosis not present

## 2013-06-20 DIAGNOSIS — M79609 Pain in unspecified limb: Secondary | ICD-10-CM | POA: Diagnosis not present

## 2013-06-20 DIAGNOSIS — Z87891 Personal history of nicotine dependence: Secondary | ICD-10-CM | POA: Diagnosis not present

## 2013-06-20 DIAGNOSIS — R042 Hemoptysis: Secondary | ICD-10-CM | POA: Diagnosis not present

## 2013-06-20 DIAGNOSIS — R3589 Other polyuria: Secondary | ICD-10-CM | POA: Diagnosis not present

## 2013-06-20 DIAGNOSIS — K5900 Constipation, unspecified: Secondary | ICD-10-CM | POA: Diagnosis not present

## 2013-06-20 DIAGNOSIS — I10 Essential (primary) hypertension: Secondary | ICD-10-CM | POA: Diagnosis not present

## 2013-06-20 LAB — CBC WITH DIFFERENTIAL/PLATELET
BASO%: 0.4 % (ref 0.0–2.0)
Basophils Absolute: 0 10*3/uL (ref 0.0–0.1)
EOS ABS: 0.1 10*3/uL (ref 0.0–0.5)
EOS%: 2.4 % (ref 0.0–7.0)
HEMATOCRIT: 45.7 % (ref 38.4–49.9)
HGB: 15.6 g/dL (ref 13.0–17.1)
LYMPH%: 37.7 % (ref 14.0–49.0)
MCH: 32.6 pg (ref 27.2–33.4)
MCHC: 34 g/dL (ref 32.0–36.0)
MCV: 95.7 fL (ref 79.3–98.0)
MONO#: 0.5 10*3/uL (ref 0.1–0.9)
MONO%: 10.4 % (ref 0.0–14.0)
NEUT#: 2.2 10*3/uL (ref 1.5–6.5)
NEUT%: 49.1 % (ref 39.0–75.0)
Platelets: 182 10*3/uL (ref 140–400)
RBC: 4.77 10*6/uL (ref 4.20–5.82)
RDW: 13.1 % (ref 11.0–14.6)
WBC: 4.5 10*3/uL (ref 4.0–10.3)
lymph#: 1.7 10*3/uL (ref 0.9–3.3)

## 2013-06-20 LAB — COMPREHENSIVE METABOLIC PANEL (CC13)
ALBUMIN: 4.1 g/dL (ref 3.5–5.0)
ALT: 49 U/L (ref 0–55)
ANION GAP: 9 meq/L (ref 3–11)
AST: 30 U/L (ref 5–34)
Alkaline Phosphatase: 100 U/L (ref 40–150)
BILIRUBIN TOTAL: 0.39 mg/dL (ref 0.20–1.20)
BUN: 19 mg/dL (ref 7.0–26.0)
CO2: 25 meq/L (ref 22–29)
Calcium: 9.8 mg/dL (ref 8.4–10.4)
Chloride: 105 mEq/L (ref 98–109)
Creatinine: 1 mg/dL (ref 0.7–1.3)
Glucose: 158 mg/dl — ABNORMAL HIGH (ref 70–140)
Potassium: 5 mEq/L (ref 3.5–5.1)
SODIUM: 139 meq/L (ref 136–145)
TOTAL PROTEIN: 7.4 g/dL (ref 6.4–8.3)

## 2013-06-24 ENCOUNTER — Encounter: Payer: Self-pay | Admitting: Internal Medicine

## 2013-06-24 ENCOUNTER — Ambulatory Visit (HOSPITAL_BASED_OUTPATIENT_CLINIC_OR_DEPARTMENT_OTHER): Payer: Medicare Other | Admitting: Internal Medicine

## 2013-06-24 ENCOUNTER — Telehealth: Payer: Self-pay | Admitting: Internal Medicine

## 2013-06-24 VITALS — BP 140/78 | HR 99 | Temp 97.8°F | Resp 18 | Ht 66.0 in | Wt 157.9 lb

## 2013-06-24 DIAGNOSIS — F17200 Nicotine dependence, unspecified, uncomplicated: Secondary | ICD-10-CM

## 2013-06-24 DIAGNOSIS — G4700 Insomnia, unspecified: Secondary | ICD-10-CM | POA: Diagnosis not present

## 2013-06-24 DIAGNOSIS — Z85118 Personal history of other malignant neoplasm of bronchus and lung: Secondary | ICD-10-CM

## 2013-06-24 DIAGNOSIS — R300 Dysuria: Secondary | ICD-10-CM | POA: Diagnosis not present

## 2013-06-24 DIAGNOSIS — K5900 Constipation, unspecified: Secondary | ICD-10-CM | POA: Diagnosis not present

## 2013-06-24 DIAGNOSIS — R042 Hemoptysis: Secondary | ICD-10-CM | POA: Diagnosis not present

## 2013-06-24 DIAGNOSIS — R32 Unspecified urinary incontinence: Secondary | ICD-10-CM | POA: Diagnosis not present

## 2013-06-24 DIAGNOSIS — Z87891 Personal history of nicotine dependence: Secondary | ICD-10-CM | POA: Diagnosis not present

## 2013-06-24 DIAGNOSIS — I10 Essential (primary) hypertension: Secondary | ICD-10-CM | POA: Diagnosis not present

## 2013-06-24 DIAGNOSIS — C3490 Malignant neoplasm of unspecified part of unspecified bronchus or lung: Secondary | ICD-10-CM

## 2013-06-24 DIAGNOSIS — N320 Bladder-neck obstruction: Secondary | ICD-10-CM | POA: Diagnosis not present

## 2013-06-24 DIAGNOSIS — M79609 Pain in unspecified limb: Secondary | ICD-10-CM | POA: Diagnosis not present

## 2013-06-24 DIAGNOSIS — R3589 Other polyuria: Secondary | ICD-10-CM | POA: Diagnosis not present

## 2013-06-24 DIAGNOSIS — M25549 Pain in joints of unspecified hand: Secondary | ICD-10-CM | POA: Diagnosis not present

## 2013-06-24 DIAGNOSIS — M25579 Pain in unspecified ankle and joints of unspecified foot: Secondary | ICD-10-CM | POA: Diagnosis not present

## 2013-06-24 NOTE — Telephone Encounter (Signed)
Gave pt appt for lab and MD  next year 2016
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
## 2013-06-24 NOTE — Patient Instructions (Signed)
Smoking Cessation Quitting smoking is important to your health and has many advantages. However, it is not always easy to quit since nicotine is a very addictive drug. Often times, people try 3 times or more before being able to quit. This document explains the best ways for you to prepare to quit smoking. Quitting takes hard work and a lot of effort, but you can do it. ADVANTAGES OF QUITTING SMOKING  You will live longer, feel better, and live better.  Your body will feel the impact of quitting smoking almost immediately.  Within 20 minutes, blood pressure decreases. Your pulse returns to its normal level.  After 8 hours, carbon monoxide levels in the blood return to normal. Your oxygen level increases.  After 24 hours, the chance of having a heart attack starts to decrease. Your breath, hair, and body stop smelling like smoke.  After 48 hours, damaged nerve endings begin to recover. Your sense of taste and smell improve.  After 72 hours, the body is virtually free of nicotine. Your bronchial tubes relax and breathing becomes easier.  After 2 to 12 weeks, lungs can hold more air. Exercise becomes easier and circulation improves.  The risk of having a heart attack, stroke, cancer, or lung disease is greatly reduced.  After 1 year, the risk of coronary heart disease is cut in half.  After 5 years, the risk of stroke falls to the same as a nonsmoker.  After 10 years, the risk of lung cancer is cut in half and the risk of other cancers decreases significantly.  After 15 years, the risk of coronary heart disease drops, usually to the level of a nonsmoker.  If you are pregnant, quitting smoking will improve your chances of having a healthy baby.  The people you live with, especially any children, will be healthier.  You will have extra money to spend on things other than cigarettes. QUESTIONS TO THINK ABOUT BEFORE ATTEMPTING TO QUIT You may want to talk about your answers with your  caregiver.  Why do you want to quit?  If you tried to quit in the past, what helped and what did not?  What will be the most difficult situations for you after you quit? How will you plan to handle them?  Who can help you through the tough times? Your family? Friends? A caregiver?  What pleasures do you get from smoking? What ways can you still get pleasure if you quit? Here are some questions to ask your caregiver:  How can you help me to be successful at quitting?  What medicine do you think would be best for me and how should I take it?  What should I do if I need more help?  What is smoking withdrawal like? How can I get information on withdrawal? GET READY  Set a quit date.  Change your environment by getting rid of all cigarettes, ashtrays, matches, and lighters in your home, car, or work. Do not let people smoke in your home.  Review your past attempts to quit. Think about what worked and what did not. GET SUPPORT AND ENCOURAGEMENT You have a better chance of being successful if you have help. You can get support in many ways.  Tell your family, friends, and co-workers that you are going to quit and need their support. Ask them not to smoke around you.  Get individual, group, or telephone counseling and support. Programs are available at local hospitals and health centers. Call your local health department for   information about programs in your area.  Spiritual beliefs and practices may help some smokers quit.  Download a "quit meter" on your computer to keep track of quit statistics, such as how long you have gone without smoking, cigarettes not smoked, and money saved.  Get a self-help book about quitting smoking and staying off of tobacco. LEARN NEW SKILLS AND BEHAVIORS  Distract yourself from urges to smoke. Talk to someone, go for a walk, or occupy your time with a task.  Change your normal routine. Take a different route to work. Drink tea instead of coffee.  Eat breakfast in a different place.  Reduce your stress. Take a hot bath, exercise, or read a book.  Plan something enjoyable to do every day. Reward yourself for not smoking.  Explore interactive web-based programs that specialize in helping you quit. GET MEDICINE AND USE IT CORRECTLY Medicines can help you stop smoking and decrease the urge to smoke. Combining medicine with the above behavioral methods and support can greatly increase your chances of successfully quitting smoking.  Nicotine replacement therapy helps deliver nicotine to your body without the negative effects and risks of smoking. Nicotine replacement therapy includes nicotine gum, lozenges, inhalers, nasal sprays, and skin patches. Some may be available over-the-counter and others require a prescription.  Antidepressant medicine helps people abstain from smoking, but how this works is unknown. This medicine is available by prescription.  Nicotinic receptor partial agonist medicine simulates the effect of nicotine in your brain. This medicine is available by prescription. Ask your caregiver for advice about which medicines to use and how to use them based on your health history. Your caregiver will tell you what side effects to look out for if you choose to be on a medicine or therapy. Carefully read the information on the package. Do not use any other product containing nicotine while using a nicotine replacement product.  RELAPSE OR DIFFICULT SITUATIONS Most relapses occur within the first 3 months after quitting. Do not be discouraged if you start smoking again. Remember, most people try several times before finally quitting. You may have symptoms of withdrawal because your body is used to nicotine. You may crave cigarettes, be irritable, feel very hungry, cough often, get headaches, or have difficulty concentrating. The withdrawal symptoms are only temporary. They are strongest when you first quit, but they will go away within  10 14 days. To reduce the chances of relapse, try to:  Avoid drinking alcohol. Drinking lowers your chances of successfully quitting.  Reduce the amount of caffeine you consume. Once you quit smoking, the amount of caffeine in your body increases and can give you symptoms, such as a rapid heartbeat, sweating, and anxiety.  Avoid smokers because they can make you want to smoke.  Do not let weight gain distract you. Many smokers will gain weight when they quit, usually less than 10 pounds. Eat a healthy diet and stay active. You can always lose the weight gained after you quit.  Find ways to improve your mood other than smoking. FOR MORE INFORMATION  www.smokefree.gov  Document Released: 05/09/2001 Document Revised: 11/14/2011 Document Reviewed: 08/24/2011 [employer] Patient Information 2014 [employer].  

## 2013-06-27 ENCOUNTER — Other Ambulatory Visit: Payer: Self-pay | Admitting: Family Medicine

## 2013-07-21 ENCOUNTER — Other Ambulatory Visit: Payer: Self-pay | Admitting: Family Medicine

## 2013-07-22 ENCOUNTER — Other Ambulatory Visit: Payer: Self-pay | Admitting: *Deleted

## 2013-07-22 MED ORDER — IBUPROFEN 800 MG PO TABS: ORAL_TABLET | ORAL | Status: DC

## 2013-09-11 ENCOUNTER — Ambulatory Visit (INDEPENDENT_AMBULATORY_CARE_PROVIDER_SITE_OTHER): Payer: Medicare Other | Admitting: Family Medicine

## 2013-09-11 ENCOUNTER — Encounter: Payer: Self-pay | Admitting: Family Medicine

## 2013-09-11 VITALS — BP 149/81 | HR 79 | Temp 98.7°F | Ht 66.0 in | Wt 156.0 lb

## 2013-09-11 DIAGNOSIS — S46909A Unspecified injury of unspecified muscle, fascia and tendon at shoulder and upper arm level, unspecified arm, initial encounter: Secondary | ICD-10-CM | POA: Diagnosis not present

## 2013-09-11 DIAGNOSIS — R042 Hemoptysis: Secondary | ICD-10-CM | POA: Diagnosis not present

## 2013-09-11 DIAGNOSIS — G4700 Insomnia, unspecified: Secondary | ICD-10-CM | POA: Diagnosis not present

## 2013-09-11 DIAGNOSIS — M545 Low back pain, unspecified: Secondary | ICD-10-CM | POA: Diagnosis not present

## 2013-09-11 DIAGNOSIS — S4980XA Other specified injuries of shoulder and upper arm, unspecified arm, initial encounter: Secondary | ICD-10-CM | POA: Diagnosis not present

## 2013-09-11 DIAGNOSIS — R32 Unspecified urinary incontinence: Secondary | ICD-10-CM | POA: Diagnosis not present

## 2013-09-11 DIAGNOSIS — R350 Frequency of micturition: Secondary | ICD-10-CM | POA: Diagnosis not present

## 2013-09-11 DIAGNOSIS — M25549 Pain in joints of unspecified hand: Secondary | ICD-10-CM | POA: Diagnosis not present

## 2013-09-11 DIAGNOSIS — M25579 Pain in unspecified ankle and joints of unspecified foot: Secondary | ICD-10-CM | POA: Diagnosis not present

## 2013-09-11 DIAGNOSIS — I10 Essential (primary) hypertension: Secondary | ICD-10-CM | POA: Diagnosis not present

## 2013-09-11 DIAGNOSIS — M79609 Pain in unspecified limb: Secondary | ICD-10-CM | POA: Diagnosis not present

## 2013-09-11 DIAGNOSIS — S4990XA Unspecified injury of shoulder and upper arm, unspecified arm, initial encounter: Secondary | ICD-10-CM

## 2013-09-11 DIAGNOSIS — K5900 Constipation, unspecified: Secondary | ICD-10-CM | POA: Diagnosis not present

## 2013-09-11 DIAGNOSIS — R3589 Other polyuria: Secondary | ICD-10-CM | POA: Diagnosis not present

## 2013-09-11 LAB — POCT URINALYSIS DIPSTICK
BILIRUBIN UA: NEGATIVE
Blood, UA: NEGATIVE
Glucose, UA: NEGATIVE
KETONES UA: NEGATIVE
Leukocytes, UA: NEGATIVE
Nitrite, UA: NEGATIVE
PH UA: 7
Protein, UA: NEGATIVE
SPEC GRAV UA: 1.01
Urobilinogen, UA: 0.2

## 2013-09-11 MED ORDER — TAMSULOSIN HCL 0.4 MG PO CAPS: 0.4000 mg | ORAL_CAPSULE | Freq: Every day | ORAL | Status: DC

## 2013-09-11 MED ORDER — CIPROFLOXACIN HCL 500 MG PO TABS: 500.0000 mg | ORAL_TABLET | Freq: Two times a day (BID) | ORAL | Status: DC

## 2013-09-11 NOTE — Patient Instructions (Signed)
I am treating you with an antibiotic and flomax.  Follow up with Dr. Andria Frames as indicated or if you fail to improve.

## 2013-09-11 NOTE — Progress Notes (Signed)
   Subjective:    Patient ID: [name], male    DOB: 11-29-1955, 58 y.o.   MRN: [id number]  HPI 58 year old male presents for evaluation of back pain and urinary frequency.  1) Low back pain & urinary frequency - Patient reports back pain x 1 month - Back pain is 8/10 in severity and located in across the lower back.  No recent fall, trauma, injury or alterations in physical activity. - He reports associated urinary frequency.  No hesitancy or urgency. No dysuria  He reports a good stream.  He does have a history of prostatitis and bladder outlet obstruction.   - He has not taken anything for his symptoms and they have continued to persist.   Review of Systems Per HPI    Objective:   Physical Exam Filed Vitals:   09/11/13 1350  BP: 149/81  Pulse: 79  Temp: 98.7 F (37.1 C)   General: appears older than stated age; NAD. Rectal: patient refused. Back: No tenderness to palpation; no erythema appreciated.     Assessment & Plan:  See Problem List

## 2013-09-11 NOTE — Assessment & Plan Note (Signed)
Likely secondary to prostatitis & contributing BPH. Will treat empirically with Cipro x 21 days and will also start Flomax.

## 2013-10-15 ENCOUNTER — Ambulatory Visit: Payer: Medicare Other | Admitting: Family Medicine

## 2013-10-17 ENCOUNTER — Ambulatory Visit (INDEPENDENT_AMBULATORY_CARE_PROVIDER_SITE_OTHER): Payer: Medicare Other | Admitting: Family Medicine

## 2013-10-17 ENCOUNTER — Encounter: Payer: Self-pay | Admitting: Family Medicine

## 2013-10-17 VITALS — BP 138/88 | HR 80 | Temp 97.8°F | Ht 66.0 in | Wt 158.3 lb

## 2013-10-17 DIAGNOSIS — R042 Hemoptysis: Secondary | ICD-10-CM | POA: Diagnosis not present

## 2013-10-17 DIAGNOSIS — G4700 Insomnia, unspecified: Secondary | ICD-10-CM | POA: Diagnosis not present

## 2013-10-17 DIAGNOSIS — R3589 Other polyuria: Secondary | ICD-10-CM | POA: Diagnosis not present

## 2013-10-17 DIAGNOSIS — M79609 Pain in unspecified limb: Secondary | ICD-10-CM | POA: Diagnosis not present

## 2013-10-17 DIAGNOSIS — G609 Hereditary and idiopathic neuropathy, unspecified: Secondary | ICD-10-CM

## 2013-10-17 DIAGNOSIS — R358 Other polyuria: Secondary | ICD-10-CM | POA: Diagnosis not present

## 2013-10-17 DIAGNOSIS — K5900 Constipation, unspecified: Secondary | ICD-10-CM | POA: Diagnosis not present

## 2013-10-17 DIAGNOSIS — R7309 Other abnormal glucose: Secondary | ICD-10-CM | POA: Diagnosis not present

## 2013-10-17 DIAGNOSIS — R32 Unspecified urinary incontinence: Secondary | ICD-10-CM | POA: Diagnosis not present

## 2013-10-17 DIAGNOSIS — I10 Essential (primary) hypertension: Secondary | ICD-10-CM

## 2013-10-17 DIAGNOSIS — R634 Abnormal weight loss: Secondary | ICD-10-CM | POA: Diagnosis not present

## 2013-10-17 DIAGNOSIS — R739 Hyperglycemia, unspecified: Secondary | ICD-10-CM

## 2013-10-17 DIAGNOSIS — N320 Bladder-neck obstruction: Secondary | ICD-10-CM | POA: Diagnosis not present

## 2013-10-17 DIAGNOSIS — M25549 Pain in joints of unspecified hand: Secondary | ICD-10-CM | POA: Diagnosis not present

## 2013-10-17 DIAGNOSIS — M25579 Pain in unspecified ankle and joints of unspecified foot: Secondary | ICD-10-CM | POA: Diagnosis not present

## 2013-10-17 LAB — POCT UA - MICROSCOPIC ONLY

## 2013-10-17 LAB — POCT URINALYSIS DIPSTICK
Blood, UA: NEGATIVE
GLUCOSE UA: NEGATIVE
Ketones, UA: NEGATIVE
Leukocytes, UA: NEGATIVE
Nitrite, UA: NEGATIVE
PH UA: 6
Protein, UA: 30
Urobilinogen, UA: 1

## 2013-10-17 LAB — POCT GLYCOSYLATED HEMOGLOBIN (HGB A1C): HEMOGLOBIN A1C: 5.1

## 2013-10-17 MED ORDER — GABAPENTIN 300 MG PO CAPS: 300.0000 mg | ORAL_CAPSULE | Freq: Every day | ORAL | Status: DC

## 2013-10-17 NOTE — Assessment & Plan Note (Signed)
Wt seems to have stabilized.  Likely due for stress of mom's death and change in living situation.

## 2013-10-17 NOTE — Assessment & Plan Note (Signed)
Also complains of generalized itching at night as goes to bed.  Will rx with gabapentin.

## 2013-10-17 NOTE — Patient Instructions (Signed)
I will call next week with blood test results and we can figure out how to treat your peeing at night. I sent in a new prescription for the funny, itching feeling at night.  Remember to tell me how it is working when I call with your lab results.   Stay away from salty foods.  Your blood pressure is going up again.  Salt is one of the things that makes it high.

## 2013-10-17 NOTE — Assessment & Plan Note (Addendum)
More bladder irritability.  Diff dx includes BPH (did not respond to flomax), chronic prostatitis (did not respond to cipro), DM, and detrussor instability.  If labs OK, will likely give trial of detrol  Labs OK.  Will try low dose detrol at night for symptomatic nocturia.

## 2013-10-17 NOTE — Progress Notes (Signed)
   Subjective:    Patient ID: [name], male    DOB: 28-Jan-1956, 58 y.o.   MRN: [id number]  HPI C/O polyuria, nocturia.  Does not give hx of flow problems.  More irritability symptoms.  Noted to have elevated BS on random lab, needs check for DM.  Denies discharge, risk for STDs.  Of note, last rx for cipro and flomax did not help symptoms. Staying clean and sober.    Review of Systems     Objective:   Physical Exam  Abd benign      Assessment & Plan:

## 2013-10-17 NOTE — Assessment & Plan Note (Signed)
Has required meds in the past.  Borderline now.  Diet poor with lots of salty foods.

## 2013-10-17 NOTE — Assessment & Plan Note (Signed)
Check for Dm

## 2013-10-18 LAB — BASIC METABOLIC PANEL
BUN: 20 mg/dL (ref 6–23)
CO2: 26 mEq/L (ref 19–32)
Calcium: 9.8 mg/dL (ref 8.4–10.5)
Chloride: 106 mEq/L (ref 96–112)
Creat: 0.86 mg/dL (ref 0.50–1.35)
Glucose, Bld: 80 mg/dL (ref 70–99)
Potassium: 4.8 mEq/L (ref 3.5–5.3)
Sodium: 143 mEq/L (ref 135–145)

## 2013-10-18 LAB — TSH: TSH: 0.899 u[IU]/mL (ref 0.350–4.500)

## 2013-10-21 MED ORDER — TOLTERODINE TARTRATE 1 MG PO TABS: 1.0000 mg | ORAL_TABLET | Freq: Every day | ORAL | Status: DC

## 2013-10-21 NOTE — Addendum Note (Signed)
Addended by: Zenia Resides on: 10/21/2013 05:12 PM   Modules accepted: Orders

## 2013-12-10 ENCOUNTER — Encounter: Payer: Self-pay | Admitting: Family Medicine

## 2013-12-10 ENCOUNTER — Ambulatory Visit (INDEPENDENT_AMBULATORY_CARE_PROVIDER_SITE_OTHER): Payer: Medicare Other | Admitting: Family Medicine

## 2013-12-10 VITALS — BP 154/86 | HR 84 | Temp 97.5°F | Ht 66.0 in | Wt 158.0 lb

## 2013-12-10 DIAGNOSIS — G4700 Insomnia, unspecified: Secondary | ICD-10-CM | POA: Diagnosis not present

## 2013-12-10 DIAGNOSIS — R32 Unspecified urinary incontinence: Secondary | ICD-10-CM | POA: Diagnosis not present

## 2013-12-10 DIAGNOSIS — M25579 Pain in unspecified ankle and joints of unspecified foot: Secondary | ICD-10-CM | POA: Diagnosis not present

## 2013-12-10 DIAGNOSIS — M79609 Pain in unspecified limb: Secondary | ICD-10-CM | POA: Diagnosis not present

## 2013-12-10 DIAGNOSIS — K5900 Constipation, unspecified: Secondary | ICD-10-CM | POA: Diagnosis not present

## 2013-12-10 DIAGNOSIS — R042 Hemoptysis: Secondary | ICD-10-CM | POA: Diagnosis not present

## 2013-12-10 DIAGNOSIS — I10 Essential (primary) hypertension: Secondary | ICD-10-CM

## 2013-12-10 DIAGNOSIS — M25549 Pain in joints of unspecified hand: Secondary | ICD-10-CM | POA: Diagnosis not present

## 2013-12-10 DIAGNOSIS — R3589 Other polyuria: Secondary | ICD-10-CM | POA: Diagnosis not present

## 2013-12-10 DIAGNOSIS — N318 Other neuromuscular dysfunction of bladder: Secondary | ICD-10-CM | POA: Diagnosis not present

## 2013-12-10 DIAGNOSIS — N3281 Overactive bladder: Secondary | ICD-10-CM

## 2013-12-10 MED ORDER — HYDROCHLOROTHIAZIDE 12.5 MG PO TABS: 12.5000 mg | ORAL_TABLET | Freq: Every day | ORAL | Status: DC

## 2013-12-10 MED ORDER — TOLTERODINE TARTRATE 2 MG PO TABS: 2.0000 mg | ORAL_TABLET | Freq: Every day | ORAL | Status: DC

## 2013-12-10 NOTE — Progress Notes (Signed)
   Subjective:    Patient ID: [name], male    DOB: 10-06-1955, 58 y.o.   MRN: [id number]  HPI  [name] is doing generally well expect he is bothered by nocturia.  He has had multiple non-infected UAs   Denies urgency and frequency.  Only nocturia x 5 - which of course makes his chronic insomnia worse.   No decrease in stream.  No ankle swelling during the day.  Does not have any fluid retaining disease to my knowledge. Second issue _ BP is back up.  He has been off meds because his BP has been controled.  He is asymptomatic and denies headache and chest pain.    Review of Systems     Objective:   Physical Exam Lungs clear Cardiac RRR without m or g Abd benign Ext no edema.       Assessment & Plan:

## 2013-12-10 NOTE — Patient Instructions (Signed)
You are looking good. Two medications. I doubled the dose of your tolterodine to help you pee less at night. I added a new blood pressure pill because your blood pressure is back up. Great work on stopping smoking. See me in 2 months to recheck your blood pressure.

## 2013-12-10 NOTE — Assessment & Plan Note (Signed)
Assume this is the major cause of his nocturia.  Will increase detrol.  Only Rx at night because he tolerates symptoms during the day.

## 2013-12-10 NOTE — Assessment & Plan Note (Signed)
Will start morning HCTZ which might help his nocturia.

## 2014-01-15 ENCOUNTER — Encounter: Payer: Self-pay | Admitting: Gastroenterology

## 2014-02-04 ENCOUNTER — Encounter: Payer: Self-pay | Admitting: Family Medicine

## 2014-02-04 ENCOUNTER — Ambulatory Visit (INDEPENDENT_AMBULATORY_CARE_PROVIDER_SITE_OTHER): Payer: Medicare Other | Admitting: Family Medicine

## 2014-02-04 ENCOUNTER — Ambulatory Visit (HOSPITAL_COMMUNITY)
Admission: RE | Admit: 2014-02-04 | Discharge: 2014-02-04 | Disposition: A | Discharge status: Home / Self Care | Payer: Medicare Other | Source: Ambulatory Visit | Attending: Family Medicine | Admitting: Family Medicine

## 2014-02-04 VITALS — BP 144/93 | HR 98 | Temp 98.1°F | Ht 66.0 in | Wt 165.0 lb

## 2014-02-04 DIAGNOSIS — M545 Low back pain, unspecified: Secondary | ICD-10-CM

## 2014-02-04 DIAGNOSIS — M25549 Pain in joints of unspecified hand: Secondary | ICD-10-CM | POA: Diagnosis not present

## 2014-02-04 DIAGNOSIS — R3589 Other polyuria: Secondary | ICD-10-CM | POA: Diagnosis not present

## 2014-02-04 DIAGNOSIS — C3490 Malignant neoplasm of unspecified part of unspecified bronchus or lung: Secondary | ICD-10-CM

## 2014-02-04 DIAGNOSIS — Z87891 Personal history of nicotine dependence: Secondary | ICD-10-CM | POA: Insufficient documentation

## 2014-02-04 DIAGNOSIS — K5900 Constipation, unspecified: Secondary | ICD-10-CM | POA: Diagnosis not present

## 2014-02-04 DIAGNOSIS — R32 Unspecified urinary incontinence: Secondary | ICD-10-CM | POA: Diagnosis not present

## 2014-02-04 DIAGNOSIS — R059 Cough, unspecified: Secondary | ICD-10-CM | POA: Diagnosis not present

## 2014-02-04 DIAGNOSIS — Z23 Encounter for immunization: Secondary | ICD-10-CM | POA: Diagnosis not present

## 2014-02-04 DIAGNOSIS — R0602 Shortness of breath: Secondary | ICD-10-CM | POA: Insufficient documentation

## 2014-02-04 DIAGNOSIS — J441 Chronic obstructive pulmonary disease with (acute) exacerbation: Secondary | ICD-10-CM | POA: Diagnosis not present

## 2014-02-04 DIAGNOSIS — I10 Essential (primary) hypertension: Secondary | ICD-10-CM

## 2014-02-04 DIAGNOSIS — Z85118 Personal history of other malignant neoplasm of bronchus and lung: Secondary | ICD-10-CM | POA: Diagnosis not present

## 2014-02-04 DIAGNOSIS — R042 Hemoptysis: Secondary | ICD-10-CM | POA: Diagnosis not present

## 2014-02-04 DIAGNOSIS — R05 Cough: Secondary | ICD-10-CM | POA: Diagnosis not present

## 2014-02-04 DIAGNOSIS — R062 Wheezing: Secondary | ICD-10-CM | POA: Insufficient documentation

## 2014-02-04 DIAGNOSIS — M25579 Pain in unspecified ankle and joints of unspecified foot: Secondary | ICD-10-CM | POA: Diagnosis not present

## 2014-02-04 DIAGNOSIS — G4700 Insomnia, unspecified: Secondary | ICD-10-CM | POA: Diagnosis not present

## 2014-02-04 DIAGNOSIS — M79609 Pain in unspecified limb: Secondary | ICD-10-CM | POA: Diagnosis not present

## 2014-02-04 DIAGNOSIS — J449 Chronic obstructive pulmonary disease, unspecified: Secondary | ICD-10-CM

## 2014-02-04 DIAGNOSIS — D1430 Benign neoplasm of unspecified bronchus and lung: Secondary | ICD-10-CM | POA: Diagnosis not present

## 2014-02-04 HISTORY — DX: Chronic obstructive pulmonary disease with (acute) exacerbation: J44.1

## 2014-02-04 MED ORDER — HYDROCHLOROTHIAZIDE 25 MG PO TABS: 25.0000 mg | ORAL_TABLET | Freq: Every day | ORAL | Status: DC

## 2014-02-04 MED ORDER — IBUPROFEN 800 MG PO TABS: ORAL_TABLET | ORAL | Status: DC

## 2014-02-04 MED ORDER — PREDNISONE 50 MG PO TABS: 50.0000 mg | ORAL_TABLET | Freq: Every day | ORAL | Status: DC

## 2014-02-04 MED ORDER — DOXYCYCLINE HYCLATE 100 MG PO TABS: 100.0000 mg | ORAL_TABLET | Freq: Two times a day (BID) | ORAL | Status: DC

## 2014-02-04 MED ORDER — BECLOMETHASONE DIPROPIONATE 80 MCG/ACT IN AERS: 2.0000 | INHALATION_SPRAY | Freq: Two times a day (BID) | RESPIRATORY_TRACT | Status: DC

## 2014-02-04 NOTE — Patient Instructions (Addendum)
I prescribed two new medicines to get you over this lung problem.  Prednisone for 5 days and doxycycline for 10 days. I also prescribed a new inhalor, QVAR, to take every day from now on.  It helps keep your lungs clear. I will get a CXR on you.  Of course, I always will have a small worry about the lung cancer coming back.. I refilled your ibuprofen I increased your blood pressure medicine to 25 mg daily.  I sent in a new prescription.  You can use up your current supply of hydrochlorothiazide by taking two pills every  I want you to see Dr. Valentina Lucks in 2 weeks for lung capacity test. See me in one month. You get a flu shot today.

## 2014-02-05 NOTE — Assessment & Plan Note (Signed)
Not on controler. No recent PFTs.  Add flovent.  Do PFTs when over current exacerbation.

## 2014-02-05 NOTE — Progress Notes (Signed)
   Subjective:    Patient ID: [name], male    DOB: Sep 27, 1955, 59 y.o.   MRN: [id number]  HPI Several issues: Cough, shortness of breath for 2-3 weeks.  No fever. 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  Has had period outbreaks for several years.  This wart is typical of previous outbreaks. Hypertension, BP running high    Review of Systems     Objective:   Physical Exam Lungs wheezing Rt > Lt.  Some rhonchi, no rales Cardiac RRR without m or g Penis, wart on shaft, frozen.       Assessment & Plan:

## 2014-02-05 NOTE — Assessment & Plan Note (Signed)
Chronic, stable, refill ibuprofen

## 2014-02-05 NOTE — Assessment & Plan Note (Signed)
CXR no pneumonia.  Pred and doxy.

## 2014-02-05 NOTE — Assessment & Plan Note (Signed)
Near goal, increase HCTZ

## 2014-02-19 ENCOUNTER — Ambulatory Visit: Payer: Medicare Other | Admitting: Pharmacist

## 2014-02-22 ENCOUNTER — Other Ambulatory Visit: Payer: Self-pay | Admitting: Family Medicine

## 2014-02-23 ENCOUNTER — Encounter: Payer: Self-pay | Admitting: Pharmacist

## 2014-02-23 ENCOUNTER — Ambulatory Visit (INDEPENDENT_AMBULATORY_CARE_PROVIDER_SITE_OTHER): Payer: Medicare Other | Admitting: Pharmacist

## 2014-02-23 VITALS — BP 122/75 | HR 91 | Ht 67.0 in | Wt 157.0 lb

## 2014-02-23 DIAGNOSIS — E7800 Pure hypercholesterolemia, unspecified: Secondary | ICD-10-CM | POA: Diagnosis not present

## 2014-02-23 DIAGNOSIS — M79609 Pain in unspecified limb: Secondary | ICD-10-CM | POA: Diagnosis not present

## 2014-02-23 DIAGNOSIS — R042 Hemoptysis: Secondary | ICD-10-CM | POA: Diagnosis not present

## 2014-02-23 DIAGNOSIS — M25549 Pain in joints of unspecified hand: Secondary | ICD-10-CM | POA: Diagnosis not present

## 2014-02-23 DIAGNOSIS — J4489 Other specified chronic obstructive pulmonary disease: Secondary | ICD-10-CM

## 2014-02-23 DIAGNOSIS — R32 Unspecified urinary incontinence: Secondary | ICD-10-CM | POA: Diagnosis not present

## 2014-02-23 DIAGNOSIS — G4700 Insomnia, unspecified: Secondary | ICD-10-CM | POA: Diagnosis not present

## 2014-02-23 DIAGNOSIS — I10 Essential (primary) hypertension: Secondary | ICD-10-CM | POA: Diagnosis not present

## 2014-02-23 DIAGNOSIS — R3589 Other polyuria: Secondary | ICD-10-CM | POA: Diagnosis not present

## 2014-02-23 DIAGNOSIS — J449 Chronic obstructive pulmonary disease, unspecified: Secondary | ICD-10-CM

## 2014-02-23 DIAGNOSIS — Z87891 Personal history of nicotine dependence: Secondary | ICD-10-CM

## 2014-02-23 DIAGNOSIS — K5900 Constipation, unspecified: Secondary | ICD-10-CM | POA: Diagnosis not present

## 2014-02-23 DIAGNOSIS — M25579 Pain in unspecified ankle and joints of unspecified foot: Secondary | ICD-10-CM | POA: Diagnosis not present

## 2014-02-23 LAB — LIPID PANEL
Cholesterol: 176 mg/dL (ref 0–200)
HDL: 31 mg/dL — AB (ref >39)
LDL Cholesterol: 100 mg/dL — ABNORMAL HIGH (ref 0–99)
Total CHOL/HDL Ratio: 5.7 Ratio
Triglycerides: 224 mg/dL — ABNORMAL HIGH (ref <150)
VLDL: 45 mg/dL — AB (ref 0–40)

## 2014-02-23 LAB — BASIC METABOLIC PANEL
BUN: 28 mg/dL — ABNORMAL HIGH (ref 6–23)
CALCIUM: 10 mg/dL (ref 8.4–10.5)
CHLORIDE: 99 meq/L (ref 96–112)
CO2: 24 meq/L (ref 19–32)
Creat: 1.31 mg/dL (ref 0.50–1.35)
GLUCOSE: 113 mg/dL — AB (ref 70–99)
Potassium: 4.1 mEq/L (ref 3.5–5.3)
SODIUM: 136 meq/L (ref 135–145)

## 2014-02-23 MED ORDER — NICOTINE 21 MG/24HR TD PT24: 21.0000 mg | MEDICATED_PATCH | Freq: Every day | TRANSDERMAL | Status: DC

## 2014-02-23 MED ORDER — TIOTROPIUM BROMIDE MONOHYDRATE 18 MCG IN CAPS: 18.0000 ug | ORAL_CAPSULE | Freq: Every day | RESPIRATORY_TRACT | Status: DC

## 2014-02-23 NOTE — Patient Instructions (Addendum)
Continue using Nicotine 21mg  patch daily. Continue using Qvar inhaler 2 puffs twice daily. Continue using albuterol inhaler as needed. Start using Spiriva inhaler once daily. Follow-up with Dr. Andria Frames as scheduled. Follow-up with pharmacy clinic in the spring.

## 2014-02-23 NOTE — Assessment & Plan Note (Signed)
Patient with chronic severe long-standing nicotine abuse, currently 11 days tobacco-free. History of lung cancer (last chemotherapy session in 2006). Patient is a good candidate for success given motivation and previous quit attempt lasting 1 year. Reordered nicotine patch today.

## 2014-02-23 NOTE — Assessment & Plan Note (Signed)
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 Post nebulized albuterol tx revealed significant improvement in FEV1, with FEV1% improving from 70% to 80%. Patient will continue using Qvar twice daily and albuterol inhaler as needed. Initiated Spiriva inhaler 1 puff daily at today's visit. Educated patient on purpose, proper use, potential adverse effects. Reviewed results of pulmonary function tests.  Pt verbalized understanding of results and education.  Written pt instructions provided.  F/U with Dr. Andria Frames in beginning of October as scheduled. May follow-up with pharmacy clinic visit in the spring.   Total time in face to face counseling 45 minutes.  Patient seen with Gloriajean Dell, PharmD Candidate and  Fuller Canada,  PharmD Resident.  Patient with chronic severe long-standing nicotine abuse, currently 11 days tobacco-free. History of lung cancer (last chemotherapy session in 2006). Patient is a good candidate for success given motivation and previous quit attempt lasting 1 year. Reordered nicotine patch today.

## 2014-02-23 NOTE — Assessment & Plan Note (Signed)
BMET given increase dose of HCTZ. BP currently at goal.

## 2014-02-23 NOTE — Progress Notes (Signed)
S:    Patient arrives alone and in good spirits. Presents for lung function evaluation.  He recently finished a course of doxycycline and prednisone following a COPD exacerbation earlier this month. Patient states that breathing is better than during his recent exacerbation, but he still complains of phlegm and is not breathing back at his baseline. He currently uses his Qvar inhaler 2-3 times a day and his albuterol inhaler about 1 time a week. He took 1 dose of his Qvar inhaler this morning, but did not use his albuterol yet today.  Patient reports that he has been cigarette-free for the past 11 days and has been using the nicotine patch. He used to smoke 1-2 packs per day starting at the age of 37 (49 years, 73 pack-year history), and states that the longest he quit was for 1 year.   Currently attending narcotics anonymous daily sessions (former cocaine user).  Patient's BP responded well to increase in HCTZ dose at the beginning of the month (122/75 today, down from 144/93 on 02/04/14).  O:  CAT score= 29 See Documentation Flowsheet - CAT/COPD for complete symptom scoring.  See "scanned report" or Documentation Flowsheet (discrete results - PFTs) for  Spirometry results. Patient provided good effort while attempting spirometry.   Lung Age = 63 Albuterol Neb  Lot# [id number]     Exp. 08/2015  A/P:  Spirometry evaluation reveals moderate obstructive lung disease corresponding to GOLD Classification B based on spirometry,  1 exacerbation in the past year and CAT score of  29. Post nebulized albuterol tx revealed significant improvement in FEV1, with FEV1% improving from 70% to 80%. Patient will continue using Qvar twice daily and albuterol inhaler as needed. Initiated Spiriva inhaler 1 puff daily at today's visit. Educated patient on purpose, proper use, potential adverse effects. Reviewed results of pulmonary function tests.  Pt verbalized understanding of results and education.  Written pt  instructions provided.  F/U with Dr. Andria Frames in beginning of October as scheduled. May follow-up with pharmacy clinic visit in the spring.   Total time in face to face counseling 45 minutes.  Patient seen with Gloriajean Dell, PharmD Candidate and  Fuller Canada,  PharmD Resident.  Patient with chronic severe long-standing nicotine abuse, currently 11 days tobacco-free. History of lung cancer (last chemotherapy session in 2006). Patient is a good candidate for success given motivation and previous quit attempt lasting 1 year. Reordered nicotine patch today.   Lipid panel ordered today since most recent one was in 2013.  BMET ordered today given recent increase in HCTZ dose at the beginning of the month.

## 2014-02-23 NOTE — Assessment & Plan Note (Signed)
Lipid panel today

## 2014-02-25 ENCOUNTER — Encounter: Payer: Self-pay | Admitting: Family Medicine

## 2014-02-25 NOTE — Progress Notes (Signed)
Patient ID: [name], male   DOB: 1955/10/05, 57 y.o.   MRN: [id number] Reviewed: Agree with Dr. Graylin Shiver documentation and management.

## 2014-03-04 ENCOUNTER — Ambulatory Visit (INDEPENDENT_AMBULATORY_CARE_PROVIDER_SITE_OTHER): Payer: Medicare Other | Admitting: Family Medicine

## 2014-03-04 ENCOUNTER — Encounter: Payer: Self-pay | Admitting: Family Medicine

## 2014-03-04 VITALS — BP 121/82 | HR 97 | Temp 97.6°F | Ht 66.0 in | Wt 162.0 lb

## 2014-03-04 DIAGNOSIS — J449 Chronic obstructive pulmonary disease, unspecified: Secondary | ICD-10-CM | POA: Diagnosis not present

## 2014-03-04 DIAGNOSIS — Z720 Tobacco use: Secondary | ICD-10-CM

## 2014-03-04 DIAGNOSIS — B351 Tinea unguium: Secondary | ICD-10-CM | POA: Diagnosis not present

## 2014-03-04 DIAGNOSIS — Z87891 Personal history of nicotine dependence: Secondary | ICD-10-CM

## 2014-03-04 DIAGNOSIS — Z23 Encounter for immunization: Secondary | ICD-10-CM

## 2014-03-04 MED ORDER — TERBINAFINE HCL 250 MG PO TABS: 250.0000 mg | ORAL_TABLET | Freq: Every day | ORAL | Status: DC

## 2014-03-05 NOTE — Patient Instructions (Signed)
Congrats on no smoking Keep using both inhalors.   You got a pneumonia vaccine today.

## 2014-03-05 NOTE — Progress Notes (Signed)
   Subjective:    Patient ID: [name], male    DOB: 1955/06/02, 58 y.o.   MRN: [id number]  HPI Still with the sensation of mild wheeze.  Does not feel that the spiriva has helped much. Given known COPD, needs pneumovax.  Has already had flu shot. Quit smoking!    Review of Systems     Objective:   Physical ExamLungs mild prolonged exp phase.  No wheeze.        Assessment & Plan:

## 2014-03-05 NOTE — Assessment & Plan Note (Signed)
Continue inhalors.  Great that he quit smoking.

## 2014-03-05 NOTE — Assessment & Plan Note (Signed)
Great work.  Discussed maintaining.

## 2014-04-06 ENCOUNTER — Telehealth: Payer: Self-pay | Admitting: Family Medicine

## 2014-04-06 NOTE — Telephone Encounter (Signed)
Patient received a letter from Provider about the ankle brace he needs. Patient needs Dr. Andria Frames fill out the form and send it back Patient. Patient needs the ankle brace soon.

## 2014-04-07 NOTE — Telephone Encounter (Signed)
I know he has an old ankle injury.  We have never discussed a brace and I doubt it would help him.  He will need an office visit if he wishes to pursue further.

## 2014-05-19 ENCOUNTER — Other Ambulatory Visit: Payer: Self-pay | Admitting: Family Medicine

## 2014-06-01 ENCOUNTER — Emergency Department (INDEPENDENT_AMBULATORY_CARE_PROVIDER_SITE_OTHER)
Admission: EM | Admit: 2014-06-01 | Discharge: 2014-06-01 | Disposition: A | Discharge status: Nursing Home (Includes ICF) | Payer: Medicare Other | Source: Home / Self Care | Attending: Family Medicine | Admitting: Family Medicine

## 2014-06-01 ENCOUNTER — Encounter (HOSPITAL_COMMUNITY): Payer: Self-pay | Admitting: Cardiology

## 2014-06-01 ENCOUNTER — Encounter (HOSPITAL_COMMUNITY): Payer: Self-pay | Admitting: Emergency Medicine

## 2014-06-01 ENCOUNTER — Emergency Department (HOSPITAL_COMMUNITY)
Admission: EM | Admit: 2014-06-01 | Discharge: 2014-06-01 | Disposition: A | Discharge status: Home / Self Care | Payer: Medicare Other | Attending: Emergency Medicine | Admitting: Emergency Medicine

## 2014-06-01 ENCOUNTER — Emergency Department (HOSPITAL_COMMUNITY): Payer: Medicare Other

## 2014-06-01 DIAGNOSIS — Z85118 Personal history of other malignant neoplasm of bronchus and lung: Secondary | ICD-10-CM | POA: Diagnosis not present

## 2014-06-01 DIAGNOSIS — R221 Localized swelling, mass and lump, neck: Secondary | ICD-10-CM | POA: Insufficient documentation

## 2014-06-01 DIAGNOSIS — Z7951 Long term (current) use of inhaled steroids: Secondary | ICD-10-CM | POA: Diagnosis not present

## 2014-06-01 DIAGNOSIS — Z923 Personal history of irradiation: Secondary | ICD-10-CM | POA: Diagnosis not present

## 2014-06-01 DIAGNOSIS — I10 Essential (primary) hypertension: Secondary | ICD-10-CM | POA: Insufficient documentation

## 2014-06-01 DIAGNOSIS — Z720 Tobacco use: Secondary | ICD-10-CM | POA: Insufficient documentation

## 2014-06-01 DIAGNOSIS — Z79899 Other long term (current) drug therapy: Secondary | ICD-10-CM | POA: Insufficient documentation

## 2014-06-01 DIAGNOSIS — Z9221 Personal history of antineoplastic chemotherapy: Secondary | ICD-10-CM | POA: Diagnosis not present

## 2014-06-01 LAB — CBC WITH DIFFERENTIAL/PLATELET
Basophils Absolute: 0 10*3/uL (ref 0.0–0.1)
Basophils Relative: 0 % (ref 0–1)
Eosinophils Absolute: 0.1 10*3/uL (ref 0.0–0.7)
Eosinophils Relative: 1 % (ref 0–5)
HEMATOCRIT: 39.9 % (ref 39.0–52.0)
HEMOGLOBIN: 14.2 g/dL (ref 13.0–17.0)
Lymphocytes Relative: 37 % (ref 12–46)
Lymphs Abs: 1.3 10*3/uL (ref 0.7–4.0)
MCH: 33.6 pg (ref 26.0–34.0)
MCHC: 35.6 g/dL (ref 30.0–36.0)
MCV: 94.3 fL (ref 78.0–100.0)
MONO ABS: 0.3 10*3/uL (ref 0.1–1.0)
MONOS PCT: 8 % (ref 3–12)
NEUTROS ABS: 1.9 10*3/uL (ref 1.7–7.7)
Neutrophils Relative %: 54 % (ref 43–77)
Platelets: 188 10*3/uL (ref 150–400)
RBC: 4.23 MIL/uL (ref 4.22–5.81)
RDW: 12.6 % (ref 11.5–15.5)
WBC: 3.6 10*3/uL — ABNORMAL LOW (ref 4.0–10.5)

## 2014-06-01 LAB — BASIC METABOLIC PANEL
Anion gap: 7 (ref 5–15)
BUN: 18 mg/dL (ref 6–23)
CO2: 26 mmol/L (ref 19–32)
CREATININE: 0.86 mg/dL (ref 0.50–1.35)
Calcium: 9.4 mg/dL (ref 8.4–10.5)
Chloride: 104 mEq/L (ref 96–112)
GFR calc Af Amer: >90 mL/min (ref >90)
Glucose, Bld: 115 mg/dL — ABNORMAL HIGH (ref 70–99)
Potassium: 3.7 mmol/L (ref 3.5–5.1)
Sodium: 137 mmol/L (ref 135–145)

## 2014-06-01 LAB — POCT RAPID STREP A: STREPTOCOCCUS, GROUP A SCREEN (DIRECT): NEGATIVE

## 2014-06-01 MED ORDER — IOHEXOL 300 MG/ML  SOLN
80.0000 mL | Freq: Once | INTRAMUSCULAR | Status: AC | PRN
  Administered 2014-06-01: 80 mL via INTRAVENOUS

## 2014-06-01 NOTE — Discharge Instructions (Signed)
You lab work and CT scan today were normal. Follow-up with Dr. Andria Frames-- have him review studies from today. Return to the ED for new concerns-- difficulty swallowing, trouble breathing, significant pain, high fever, etc.
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
## 2014-06-01 NOTE — ED Notes (Signed)
Sore throat, neck fullness noticed for a month per patient.

## 2014-06-01 NOTE — ED Notes (Signed)
Pt reports that for the past month he has had swelling in his throat that does not seem to be getting any better. Reports he was sent here for further evaluation. Possible CT scan.
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
## 2014-06-03 LAB — CULTURE, GROUP A STREP

## 2014-06-22 ENCOUNTER — Ambulatory Visit (HOSPITAL_COMMUNITY)
Admission: RE | Admit: 2014-06-22 | Discharge: 2014-06-22 | Disposition: A | Discharge status: Home / Self Care | Payer: Medicare Other | Source: Ambulatory Visit | Attending: Internal Medicine | Admitting: Internal Medicine

## 2014-06-22 ENCOUNTER — Other Ambulatory Visit (HOSPITAL_BASED_OUTPATIENT_CLINIC_OR_DEPARTMENT_OTHER): Payer: Medicare Other

## 2014-06-22 ENCOUNTER — Encounter (HOSPITAL_COMMUNITY): Payer: Self-pay

## 2014-06-22 DIAGNOSIS — R0602 Shortness of breath: Secondary | ICD-10-CM | POA: Diagnosis not present

## 2014-06-22 DIAGNOSIS — Z85118 Personal history of other malignant neoplasm of bronchus and lung: Secondary | ICD-10-CM

## 2014-06-22 DIAGNOSIS — R079 Chest pain, unspecified: Secondary | ICD-10-CM | POA: Diagnosis not present

## 2014-06-22 DIAGNOSIS — C3490 Malignant neoplasm of unspecified part of unspecified bronchus or lung: Secondary | ICD-10-CM

## 2014-06-22 DIAGNOSIS — Z720 Tobacco use: Secondary | ICD-10-CM | POA: Diagnosis not present

## 2014-06-22 DIAGNOSIS — B351 Tinea unguium: Secondary | ICD-10-CM | POA: Diagnosis not present

## 2014-06-22 DIAGNOSIS — N200 Calculus of kidney: Secondary | ICD-10-CM | POA: Diagnosis not present

## 2014-06-22 DIAGNOSIS — Z08 Encounter for follow-up examination after completed treatment for malignant neoplasm: Secondary | ICD-10-CM | POA: Insufficient documentation

## 2014-06-22 DIAGNOSIS — J449 Chronic obstructive pulmonary disease, unspecified: Secondary | ICD-10-CM | POA: Diagnosis not present

## 2014-06-22 LAB — CBC WITH DIFFERENTIAL/PLATELET
BASO%: 0.3 % (ref 0.0–2.0)
Basophils Absolute: 0 10*3/uL (ref 0.0–0.1)
EOS%: 4.8 % (ref 0.0–7.0)
Eosinophils Absolute: 0.2 10*3/uL (ref 0.0–0.5)
HCT: 42.6 % (ref 38.4–49.9)
HEMOGLOBIN: 14.6 g/dL (ref 13.0–17.1)
LYMPH%: 40.1 % (ref 14.0–49.0)
MCH: 32.4 pg (ref 27.2–33.4)
MCHC: 34.3 g/dL (ref 32.0–36.0)
MCV: 94.7 fL (ref 79.3–98.0)
MONO#: 0.5 10*3/uL (ref 0.1–0.9)
MONO%: 13 % (ref 0.0–14.0)
NEUT%: 41.8 % (ref 39.0–75.0)
NEUTROS ABS: 1.7 10*3/uL (ref 1.5–6.5)
Platelets: 187 10*3/uL (ref 140–400)
RBC: 4.5 10*6/uL (ref 4.20–5.82)
RDW: 12.8 % (ref 11.0–14.6)
WBC: 4 10*3/uL (ref 4.0–10.3)
lymph#: 1.6 10*3/uL (ref 0.9–3.3)

## 2014-06-22 LAB — COMPREHENSIVE METABOLIC PANEL (CC13)
ALBUMIN: 3.8 g/dL (ref 3.5–5.0)
ALK PHOS: 92 U/L (ref 40–150)
ALT: 56 U/L — AB (ref 0–55)
ANION GAP: 11 meq/L (ref 3–11)
AST: 46 U/L — ABNORMAL HIGH (ref 5–34)
BUN: 18.7 mg/dL (ref 7.0–26.0)
CO2: 25 meq/L (ref 22–29)
Calcium: 9.1 mg/dL (ref 8.4–10.4)
Chloride: 103 mEq/L (ref 98–109)
Creatinine: 1.1 mg/dL (ref 0.7–1.3)
EGFR: 71 mL/min/{1.73_m2} — ABNORMAL LOW (ref >90)
Glucose: 103 mg/dl (ref 70–140)
Potassium: 4.2 mEq/L (ref 3.5–5.1)
Sodium: 139 mEq/L (ref 136–145)
Total Bilirubin: 0.41 mg/dL (ref 0.20–1.20)
Total Protein: 7.4 g/dL (ref 6.4–8.3)

## 2014-06-24 ENCOUNTER — Encounter: Payer: Self-pay | Admitting: Internal Medicine

## 2014-06-24 ENCOUNTER — Telehealth: Payer: Self-pay | Admitting: Internal Medicine

## 2014-06-24 ENCOUNTER — Ambulatory Visit (HOSPITAL_BASED_OUTPATIENT_CLINIC_OR_DEPARTMENT_OTHER): Payer: Medicare Other | Admitting: Internal Medicine

## 2014-06-24 VITALS — BP 128/84 | HR 94 | Temp 97.7°F | Resp 18 | Ht 66.0 in | Wt 163.9 lb

## 2014-06-24 DIAGNOSIS — B351 Tinea unguium: Secondary | ICD-10-CM | POA: Diagnosis not present

## 2014-06-24 DIAGNOSIS — J449 Chronic obstructive pulmonary disease, unspecified: Secondary | ICD-10-CM | POA: Diagnosis not present

## 2014-06-24 DIAGNOSIS — Z85118 Personal history of other malignant neoplasm of bronchus and lung: Secondary | ICD-10-CM | POA: Diagnosis not present

## 2014-06-24 DIAGNOSIS — Z23 Encounter for immunization: Secondary | ICD-10-CM | POA: Diagnosis not present

## 2014-06-24 DIAGNOSIS — Z720 Tobacco use: Secondary | ICD-10-CM | POA: Diagnosis not present

## 2014-06-24 DIAGNOSIS — C3411 Malignant neoplasm of upper lobe, right bronchus or lung: Secondary | ICD-10-CM

## 2014-06-24 DIAGNOSIS — C3480 Malignant neoplasm of overlapping sites of unspecified bronchus and lung: Secondary | ICD-10-CM
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
## 2014-06-24 NOTE — Telephone Encounter (Signed)
Gave avs & calendar for January 2017.

## 2014-07-21 ENCOUNTER — Other Ambulatory Visit: Payer: Self-pay | Admitting: Family Medicine

## 2014-07-21 DIAGNOSIS — N3281 Overactive bladder: Secondary | ICD-10-CM

## 2014-07-21 NOTE — Assessment & Plan Note (Signed)
Refill per e request 

## 2014-09-25 ENCOUNTER — Other Ambulatory Visit: Payer: Self-pay | Admitting: Family Medicine

## 2014-11-25 ENCOUNTER — Telehealth: Payer: Self-pay | Admitting: Family Medicine

## 2014-11-25 NOTE — Telephone Encounter (Signed)
Wants to talk to dr hensel.....Marland Kitchensomething about some family pictures after his fathers death 18 he is very upset, very depressed,  Is smoking again Needs to know what some options are?

## 2014-11-26 NOTE — Telephone Encounter (Signed)
Called.  Depressed and upset.  Several recent family deaths.  Friction with his surviving brother and sister.  Coincidental with this phone call, I have been told by other sources that he is using pills again.  I asked and he agreed that make an appointment to see me.  No SI or HI.

## 2014-12-02 ENCOUNTER — Inpatient Hospital Stay (HOSPITAL_COMMUNITY): Payer: Medicare Other

## 2014-12-02 ENCOUNTER — Inpatient Hospital Stay (HOSPITAL_COMMUNITY)
Admission: EM | Admit: 2014-12-02 | Discharge: 2014-12-03 | DRG: 066 | Discharge status: Against Medical Advice | Payer: Medicare Other | Attending: Family Medicine | Admitting: Family Medicine

## 2014-12-02 ENCOUNTER — Encounter (HOSPITAL_COMMUNITY): Payer: Self-pay | Admitting: Physical Medicine and Rehabilitation

## 2014-12-02 ENCOUNTER — Ambulatory Visit: Payer: Medicare Other | Admitting: Family Medicine

## 2014-12-02 ENCOUNTER — Emergency Department (HOSPITAL_COMMUNITY): Payer: Medicare Other

## 2014-12-02 DIAGNOSIS — Z8673 Personal history of transient ischemic attack (TIA), and cerebral infarction without residual deficits: Secondary | ICD-10-CM | POA: Diagnosis not present

## 2014-12-02 DIAGNOSIS — E785 Hyperlipidemia, unspecified: Secondary | ICD-10-CM | POA: Diagnosis present

## 2014-12-02 DIAGNOSIS — R1310 Dysphagia, unspecified: Secondary | ICD-10-CM | POA: Diagnosis present

## 2014-12-02 DIAGNOSIS — F17210 Nicotine dependence, cigarettes, uncomplicated: Secondary | ICD-10-CM | POA: Diagnosis present

## 2014-12-02 DIAGNOSIS — I10 Essential (primary) hypertension: Secondary | ICD-10-CM | POA: Diagnosis not present

## 2014-12-02 DIAGNOSIS — I639 Cerebral infarction, unspecified: Principal | ICD-10-CM | POA: Diagnosis present

## 2014-12-02 DIAGNOSIS — R42 Dizziness and giddiness: Secondary | ICD-10-CM | POA: Diagnosis not present

## 2014-12-02 DIAGNOSIS — I6601 Occlusion and stenosis of right middle cerebral artery: Secondary | ICD-10-CM | POA: Diagnosis not present

## 2014-12-02 DIAGNOSIS — Z720 Tobacco use: Secondary | ICD-10-CM | POA: Diagnosis not present

## 2014-12-02 DIAGNOSIS — G8190 Hemiplegia, unspecified affecting unspecified side: Secondary | ICD-10-CM

## 2014-12-02 DIAGNOSIS — Z66 Do not resuscitate: Secondary | ICD-10-CM | POA: Diagnosis present

## 2014-12-02 DIAGNOSIS — J449 Chronic obstructive pulmonary disease, unspecified: Secondary | ICD-10-CM | POA: Diagnosis present

## 2014-12-02 DIAGNOSIS — Z79899 Other long term (current) drug therapy: Secondary | ICD-10-CM

## 2014-12-02 DIAGNOSIS — I638 Other cerebral infarction: Secondary | ICD-10-CM | POA: Diagnosis not present

## 2014-12-02 DIAGNOSIS — R471 Dysarthria and anarthria: Secondary | ICD-10-CM | POA: Diagnosis present

## 2014-12-02 DIAGNOSIS — I63312 Cerebral infarction due to thrombosis of left middle cerebral artery: Secondary | ICD-10-CM | POA: Diagnosis not present

## 2014-12-02 DIAGNOSIS — I6330 Cerebral infarction due to thrombosis of unspecified cerebral artery: Secondary | ICD-10-CM

## 2014-12-02 DIAGNOSIS — Z85118 Personal history of other malignant neoplasm of bronchus and lung: Secondary | ICD-10-CM

## 2014-12-02 DIAGNOSIS — R000 Tachycardia, unspecified: Secondary | ICD-10-CM | POA: Diagnosis not present

## 2014-12-02 LAB — I-STAT TROPONIN, ED: Troponin i, poc: 0.01 ng/mL (ref 0.00–0.08)

## 2014-12-02 LAB — URINALYSIS, ROUTINE W REFLEX MICROSCOPIC
Glucose, UA: NEGATIVE mg/dL
HGB URINE DIPSTICK: NEGATIVE
KETONES UR: 15 mg/dL — AB
LEUKOCYTES UA: NEGATIVE
NITRITE: NEGATIVE
Protein, ur: NEGATIVE mg/dL
Specific Gravity, Urine: 1.027 (ref 1.005–1.030)
Urobilinogen, UA: 1 mg/dL (ref 0.0–1.0)
pH: 5.5 (ref 5.0–8.0)

## 2014-12-02 LAB — COMPREHENSIVE METABOLIC PANEL
ALBUMIN: 3.7 g/dL (ref 3.5–5.0)
ALK PHOS: 86 U/L (ref 38–126)
ALT: 48 U/L (ref 17–63)
ANION GAP: 8 (ref 5–15)
AST: 51 U/L — ABNORMAL HIGH (ref 15–41)
BUN: 17 mg/dL (ref 6–20)
CALCIUM: 9.4 mg/dL (ref 8.9–10.3)
CO2: 24 mmol/L (ref 22–32)
Chloride: 107 mmol/L (ref 101–111)
Creatinine, Ser: 0.98 mg/dL (ref 0.61–1.24)
GFR calc non Af Amer: >60 mL/min (ref >60)
Glucose, Bld: 133 mg/dL — ABNORMAL HIGH (ref 65–99)
POTASSIUM: 3.9 mmol/L (ref 3.5–5.1)
SODIUM: 139 mmol/L (ref 135–145)
TOTAL PROTEIN: 7.2 g/dL (ref 6.5–8.1)
Total Bilirubin: 1.1 mg/dL (ref 0.3–1.2)

## 2014-12-02 LAB — CBC
HEMATOCRIT: 40.2 % (ref 39.0–52.0)
HEMOGLOBIN: 14.2 g/dL (ref 13.0–17.0)
MCH: 32.1 pg (ref 26.0–34.0)
MCHC: 35.3 g/dL (ref 30.0–36.0)
MCV: 91 fL (ref 78.0–100.0)
Platelets: 204 10*3/uL (ref 150–400)
RBC: 4.42 MIL/uL (ref 4.22–5.81)
RDW: 13.1 % (ref 11.5–15.5)
WBC: 5 10*3/uL (ref 4.0–10.5)

## 2014-12-02 LAB — GLUCOSE, CAPILLARY: Glucose-Capillary: 81 mg/dL (ref 65–99)

## 2014-12-02 LAB — DIFFERENTIAL
BASOS PCT: 0 % (ref 0–1)
Basophils Absolute: 0 10*3/uL (ref 0.0–0.1)
EOS ABS: 0.1 10*3/uL (ref 0.0–0.7)
Eosinophils Relative: 3 % (ref 0–5)
LYMPHS ABS: 1.4 10*3/uL (ref 0.7–4.0)
Lymphocytes Relative: 27 % (ref 12–46)
Monocytes Absolute: 0.4 10*3/uL (ref 0.1–1.0)
Monocytes Relative: 9 % (ref 3–12)
NEUTROS ABS: 3.1 10*3/uL (ref 1.7–7.7)
NEUTROS PCT: 61 % (ref 43–77)

## 2014-12-02 LAB — RAPID URINE DRUG SCREEN, HOSP PERFORMED
AMPHETAMINES: NOT DETECTED
BENZODIAZEPINES: NOT DETECTED
Barbiturates: NOT DETECTED
Cocaine: NOT DETECTED
OPIATES: NOT DETECTED
TETRAHYDROCANNABINOL: NOT DETECTED

## 2014-12-02 LAB — ETHANOL: Alcohol, Ethyl (B): <5 mg/dL (ref <5)

## 2014-12-02 LAB — PROTIME-INR
INR: 0.98 (ref 0.00–1.49)
PROTHROMBIN TIME: 13.2 s (ref 11.6–15.2)

## 2014-12-02 LAB — APTT: aPTT: 33 seconds (ref 24–37)

## 2014-12-02 MED ORDER — ASPIRIN 325 MG PO TABS
325.0000 mg | ORAL_TABLET | Freq: Every day | ORAL | Status: DC
  Administered 2014-12-03: 325 mg via ORAL
  Filled 2014-12-02: qty 1

## 2014-12-02 MED ORDER — TIOTROPIUM BROMIDE MONOHYDRATE 18 MCG IN CAPS: 18.0000 ug | ORAL_CAPSULE | Freq: Every day | RESPIRATORY_TRACT | Status: DC | PRN

## 2014-12-02 MED ORDER — BECLOMETHASONE DIPROPIONATE 80 MCG/ACT IN AERS: 2.0000 | INHALATION_SPRAY | Freq: Two times a day (BID) | RESPIRATORY_TRACT | Status: DC

## 2014-12-02 MED ORDER — PANTOPRAZOLE SODIUM 20 MG PO TBEC
20.0000 mg | DELAYED_RELEASE_TABLET | Freq: Every day | ORAL | Status: DC
  Administered 2014-12-03: 20 mg via ORAL
  Filled 2014-12-02 (×2): qty 1

## 2014-12-02 MED ORDER — ASPIRIN 325 MG PO TABS: 325.0000 mg | ORAL_TABLET | Freq: Every day | ORAL | Status: DC

## 2014-12-02 MED ORDER — SENNOSIDES-DOCUSATE SODIUM 8.6-50 MG PO TABS: 1.0000 | ORAL_TABLET | Freq: Every evening | ORAL | Status: DC | PRN

## 2014-12-02 MED ORDER — ASPIRIN 300 MG RE SUPP: 300.0000 mg | Freq: Every day | RECTAL | Status: DC

## 2014-12-02 MED ORDER — DEXTROSE-NACL 5-0.45 % IV SOLN
INTRAVENOUS | Status: DC
  Administered 2014-12-03: 07:00:00 via INTRAVENOUS

## 2014-12-02 MED ORDER — SODIUM CHLORIDE 0.9 % IV BOLUS (SEPSIS)
500.0000 mL | Freq: Once | INTRAVENOUS | Status: AC
  Administered 2014-12-02: 500 mL via INTRAVENOUS

## 2014-12-02 MED ORDER — HEPARIN SODIUM (PORCINE) 5000 UNIT/ML IJ SOLN
5000.0000 [IU] | Freq: Three times a day (TID) | INTRAMUSCULAR | Status: DC
  Administered 2014-12-02 – 2014-12-03 (×2): 5000 [IU] via SUBCUTANEOUS
  Filled 2014-12-02 (×2): qty 1

## 2014-12-02 MED ORDER — GADOBENATE DIMEGLUMINE 529 MG/ML IV SOLN
15.0000 mL | Freq: Once | INTRAVENOUS | Status: AC | PRN
  Administered 2014-12-02: 15 mL via INTRAVENOUS

## 2014-12-02 MED ORDER — BUDESONIDE 0.25 MG/2ML IN SUSP
0.2500 mg | Freq: Two times a day (BID) | RESPIRATORY_TRACT | Status: DC
  Administered 2014-12-02 – 2014-12-03 (×2): 0.25 mg via RESPIRATORY_TRACT
  Filled 2014-12-02 (×2): qty 2

## 2014-12-02 MED ORDER — STROKE: EARLY STAGES OF RECOVERY BOOK
Freq: Once | Status: DC
  Filled 2014-12-02: qty 1
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
## 2014-12-02 NOTE — Progress Notes (Signed)
Patient admitted for ER. Patient alert and oriented x 4. Patient oriented toroom and made comfortable. Denies any need at this time.
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
## 2014-12-02 NOTE — Progress Notes (Signed)
Patient has an order for po aspirin. Patient is NPO. MD paged to notify to change med to a different route. Awaiting reply.

## 2014-12-02 NOTE — ED Notes (Signed)
Pt presents to department for evaluation of R sided numbness, LSN 11:30pm last night. Pt states he woke up this morning and began having R arm and face numbness. Pt is alert and oriented x4. History of CVA in the past.

## 2014-12-02 NOTE — ED Notes (Signed)
Pt failed RN stroke swallow screen.

## 2014-12-03 ENCOUNTER — Inpatient Hospital Stay (HOSPITAL_COMMUNITY): Payer: Medicare Other

## 2014-12-03 DIAGNOSIS — I10 Essential (primary) hypertension: Secondary | ICD-10-CM

## 2014-12-03 DIAGNOSIS — E785 Hyperlipidemia, unspecified: Secondary | ICD-10-CM

## 2014-12-03 DIAGNOSIS — Z720 Tobacco use: Secondary | ICD-10-CM

## 2014-12-03 DIAGNOSIS — I639 Cerebral infarction, unspecified: Principal | ICD-10-CM

## 2014-12-03 DIAGNOSIS — I63312 Cerebral infarction due to thrombosis of left middle cerebral artery: Secondary | ICD-10-CM

## 2014-12-03 LAB — GLUCOSE, CAPILLARY
Glucose-Capillary: 101 mg/dL — ABNORMAL HIGH (ref 65–99)
Glucose-Capillary: 119 mg/dL — ABNORMAL HIGH (ref 65–99)
Glucose-Capillary: 80 mg/dL (ref 65–99)
Glucose-Capillary: 88 mg/dL (ref 65–99)

## 2014-12-03 LAB — LIPID PANEL
Cholesterol: 137 mg/dL (ref 0–200)
HDL: 26 mg/dL — AB (ref >40)
LDL CALC: 92 mg/dL (ref 0–99)
TRIGLYCERIDES: 95 mg/dL (ref <150)
Total CHOL/HDL Ratio: 5.3 RATIO
VLDL: 19 mg/dL (ref 0–40)

## 2014-12-03 MED ORDER — ATORVASTATIN CALCIUM 40 MG PO TABS: 40.0000 mg | ORAL_TABLET | Freq: Every day | ORAL | Status: DC

## 2014-12-03 MED ORDER — ATORVASTATIN CALCIUM 10 MG PO TABS: 20.0000 mg | ORAL_TABLET | Freq: Every day | ORAL | Status: DC

## 2014-12-03 NOTE — Progress Notes (Signed)
Utilization review completed. Arora Coakley, RN, BSN. 

## 2014-12-03 NOTE — Evaluation (Signed)
Physical Therapy Evaluation Patient Details Name: [name] MRN: [id number] DOB: Jun 18, 1955 Today's Date: 12/03/2014   History of Present Illness  59 y.o. male presenting with dysarthria, right face numbness and right hand tingling. PMH is significant for prior CVA in 2012, tobacco abuse, COPD, HTN, and HLD.   Clinical Impression  Patient mobilizing well, no physical assist required. Performed stair negotiation and DGI assessment. Patient with some perceptual deficits to right side, however, did not impair mobility. Patient educated on safety with deficits. No further acute PT needs, will sign off.    Follow Up Recommendations No PT follow up    Equipment Recommendations  None recommended by PT    Recommendations for Other Services       Precautions / Restrictions Precautions Precautions: Fall Restrictions Weight Bearing Restrictions: No      Mobility  Bed Mobility Overal bed mobility: Modified Independent                Transfers Overall transfer level: Modified independent Equipment used: None                Ambulation/Gait Ambulation/Gait assistance: Independent Ambulation Distance (Feet): 280 Feet Assistive device: None Gait Pattern/deviations: Step-through pattern;Drifts right/left Gait velocity: decreased  Gait velocity interpretation: Below normal speed for age/gender General Gait Details: some poor perception to the right side during ambulation  Stairs Stairs: Yes Stairs assistance: Modified independent (Device/Increase time) Stair Management: One rail Left;Alternating pattern Number of Stairs: 5 General stair comments: no physical assist or cues requred  Wheelchair Mobility    Modified Rankin (Stroke Patients Only) Modified Rankin (Stroke Patients Only) Pre-Morbid Rankin Score: No symptoms Modified Rankin: Moderate disability     Balance Overall balance assessment: No apparent balance deficits (not formally assessed)                                Standardized Balance Assessment Standardized Balance Assessment : Dynamic Gait Index   Dynamic Gait Index Level Surface: Normal Change in Gait Speed: Normal Gait with Horizontal Head Turns: Normal Gait with Vertical Head Turns: Mild Impairment Gait and Pivot Turn: Normal Step Over Obstacle: Mild Impairment Step Around Obstacles: Normal Steps: Mild Impairment Total Score: 21       Pertinent Vitals/Pain Pain Assessment: No/denies pain    Home Living Family/patient expects to be discharged to:: Private residence Living Arrangements: Non-relatives/Friends Available Help at Discharge: Friend(s);Available PRN/intermittently Type of Home: House Home Access: Stairs to enter Entrance Stairs-Rails: None Entrance Stairs-Number of Steps: 6-7 Home Layout: One level Home Equipment: None      Prior Function Level of Independence: Independent         Comments: Pt does not drive; reports he is currently on disability and not working     Hand Dominance   Dominant Hand: Right    Extremity/Trunk Assessment   Upper Extremity Assessment: Overall WFL for tasks assessed           Lower Extremity Assessment: Overall WFL for tasks assessed         Communication   Communication: Expressive difficulties (Pt reports speech deficits at baseline from past CVA)  Cognition Arousal/Alertness: Awake/alert Behavior During Therapy: Impulsive;Agitated Overall Cognitive Status: Within Functional Limits for tasks assessed                      General Comments      Exercises        Assessment/Plan  PT Assessment Patent does not need any further PT services  PT Diagnosis Difficulty walking   PT Problem List    PT Treatment Interventions     PT Goals (Current goals can be found in the Care Plan section) Acute Rehab PT Goals Patient Stated Goal: To get out of here PT Goal Formulation: All assessment and education complete, DC  therapy    Frequency     Barriers to discharge        Co-evaluation               End of Session Equipment Utilized During Treatment: Gait belt Activity Tolerance: Patient tolerated treatment well Patient left: in bed;with call bell/phone within reach;with bed alarm set Nurse Communication: Mobility status    Functional Assessment Tool Used: dgi Functional Limitation: Mobility: Walking and moving around Mobility: Walking and Moving Around Current Status (A9191): At least 1 percent but less than 20 percent impaired, limited or restricted Mobility: Walking and Moving Around Goal Status [phone]): At least 1 percent but less than 20 percent impaired, limited or restricted Mobility: Walking and Moving Around Discharge Status [phone]): At least 1 percent but less than 20 percent impaired, limited or restricted    Time: 7741-4239 PT Time Calculation (min) (ACUTE ONLY): 14 min   Charges:   PT Evaluation $Initial PT Evaluation Tier I: 1 Procedure     PT G Codes:   PT G-Codes **NOT FOR INPATIENT CLASS** Functional Assessment Tool Used: dgi Functional Limitation: Mobility: Walking and moving around Mobility: Walking and Moving Around Current Status (R3202): At least 1 percent but less than 20 percent impaired, limited or restricted Mobility: Walking and Moving Around Goal Status [phone]): At least 1 percent but less than 20 percent impaired, limited or restricted Mobility: Walking and Moving Around Discharge Status [phone]): At least 1 percent but less than 20 percent impaired, limited or restricted    Duncan Dull 12/03/2014, 2:18 PM Alben Deeds, [place] DPT  [phone]

## 2014-12-03 NOTE — Progress Notes (Signed)
Family Medicine Teaching Service Daily Progress Note Intern Pager: [phone]  Patient name: [name] record number: [id number] Date of birth: December 06, 1957Age: 59 y.o.Gender: male  Primary Care Provider: Zigmund Gottron, MD Consultants: Neurology Code Status: DNR (confirmed on admission)  Chief Complaint: Dysarthria, Right face numbness, and right hand tingling.   Assessment and Plan: [name] is a 59 y.o. male presenting with dysarthria, right face numbness and right hand tingling concerning for CVA. PMH is significant for prior CVA in 2012, tobacco abuse, COPD, HTN, and HLD.  CVA. Symptoms concerning for recurrent CVA. Head CT obtained in ED shows no acute changes. Patient was not on statin or ASA. CT head with no acute process but chronic microvascular ischemic changes in the cerebral white matter. MRI head with some new and chronic . MRA with no large vessel or proximal arterial branch occlusion but short-segment moderate stenosis within a proximal right M2 branch at the base of the right sylvian fissure. Will need risk factor modification.  - Neurology involved, appreciate assistance - f/u Echo - Carotid dopplers - PT/OT/SLP evals - f/u A1c (5.1 a year ago) -f/u neuro consult. - continue Asprin,  - start statin - start ACI - NPO until passes swallow eval.   HTN. Only on HCTZ '25mg'$  daily prior to admission. BP elevated to 170s/90s in ED - Will allow for permissive hypertension to 220/120 for the first 24 hours after CVA - Restart home antihypertensive as indicated - Start ACI  HLD. Lipid panel in 2015: total cholesterol 137, TG 95, HDL 26, LDL 92. Not on any statin therapy. - Will likely need statin therapy.   COPD. Currently stable. - Continue home Qvar and spiriva - Albuterol prn  FEN/GI: NPO until passes swallow eval. Protonix (Formulary PPI). SLIV.  Prophylaxis: Sub Q heparin  Disposition: Admit to telemetry pending above  work up. Subjective:  Reports the numbness and tingling have resolved. He denies any neurologic symptoms. Asks if he can go home today as his problems have resolved.  Objective: Temp:  [97.6 F (36.4 C)-99 F (37.2 C)] 97.7 F (36.5 C) (07/07 0605) Pulse Rate:  [79-114] 85 (07/07 0605) Resp:  [14-23] 18 (07/07 0605) BP: (121-187)/(76-106) 153/95 mmHg (07/07 0605) SpO2:  [95 %-100 %] 95 % (07/07 0605) Weight:  [165 lb (74.844 kg)] 165 lb (74.844 kg) (07/06 1228) Physical Exam:  General: 59 year old man in NAD lying in hospital bed, dysarthric speech noted.  Eyes: PERRL, EOMI ENTM: MMM, OP clear Neck: Supple, FROM Cardiovascular: RRR, no murmurs appreciated Respiratory: NWOB, CTAB with no wheezes or crackles Abdomen: +BS, S, NT, ND MSK: No extremity clubbing or cyanosis Skin: No rashes or areas of skin breakdown Neuro: AAOx3, CN2-12 intact. Strength 5/5 in all ext. Sensation intact in all exts. Finger to nose and heel to shin normal. Reflex 3/5 in left knee and 2/5 in right knee. Biabinski negative Psych: Normal affect and though content.   Laboratory:  Recent Labs Lab 12/02/14 1241  WBC 5.0  HGB 14.2  HCT 40.2  PLT 204    Recent Labs Lab 12/02/14 1241  NA 139  K 3.9  CL 107  CO2 24  BUN 17  CREATININE 0.98  CALCIUM 9.4  PROT 7.2  BILITOT 1.1  ALKPHOS 86  ALT 48  AST 51*  GLUCOSE 133*    Imaging/Diagnostic Tests: Ct Head Wo Contrast  12/02/2014   IMPRESSION: 1. No acute intracranial abnormalities. 2. Chronic microvascular ischemic changes in the cerebral white matter, as above.  3. Small right mastoid effusion.   Electronically Signed   By: Vinnie Langton M.D.   On: 12/02/2014 13:29   Mr [name] PF Contrast  12/03/2014   MRI HEAD IMPRESSION:  1. 6 mm acute ischemic nonhemorrhagic lacunar type infarct within the left thalamus without significant mass effect. 2. Remote left basal ganglia and right cerebellar infarcts as above. 3. Generalized cerebral  atrophy with mild to moderate chronic microvascular ischemic disease    MRA HEAD IMPRESSION:  1. No large vessel or proximal arterial branch occlusion identified within the intracranial circulation. 2. Short-segment moderate stenosis within a proximal right M2 branch at the base of the right sylvian fissure. 3. No other hemodynamically significant stenosis identified. 4. Distal branch atheromatous irregularity within the MCA and PCA branches bilaterally. 5. Fetal origin of the left PCA.   Electronically Signed   By: Jeannine Boga M.D.   On: 12/03/2014 00:32   Mr [name] Head/brain Wo Cm  12/03/2014  IMPRESSION: MRI HEAD IMPRESSION:  1. 6 mm acute ischemic nonhemorrhagic lacunar type infarct within the left thalamus without significant mass effect. 2. Remote left basal ganglia and right cerebellar infarcts as above. 3. Generalized cerebral atrophy with mild to moderate chronic microvascular ischemic disease    MRA HEAD IMPRESSION:  1. No large vessel or proximal arterial branch occlusion identified within the intracranial circulation. 2. Short-segment moderate stenosis within a proximal right M2 branch at the base of the right sylvian fissure. 3. No other hemodynamically significant stenosis identified. 4. Distal branch atheromatous irregularity within the MCA and PCA branches bilaterally. 5. Fetal origin of the left PCA.   Electronically Signed   By: Jeannine Boga M.D.   On: 12/03/2014 00:32    Douglas Riding, MD 12/03/2014, 7:25 AM PGY-1, [place] Intern pager: [phone], text pages welcome
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
## 2014-12-03 NOTE — Evaluation (Signed)
Occupational Therapy Evaluation Patient Details Name: [name] MRN: [id number] DOB: 1955-06-28 Today's Date: 12/03/2014    History of Present Illness 59 y.o. male presenting with dysarthria, right face numbness and right hand tingling. PMH is significant for prior CVA in 2012, tobacco abuse, COPD, HTN, and HLD.    Clinical Impression   Pt denies sensation or vision deficits; reports speech deficits at baseline from past CVA. Pt demonstrates ability to complete ADLs with supervision. Pt upset that he is NPO; and threatening to leave AMA. Speech notified of Pt status and request for food/ drink.     Follow Up Recommendations  No OT follow up;Supervision - Intermittent    Equipment Recommendations  None recommended by OT    Recommendations for Other Services       Precautions / Restrictions Precautions Precautions: Fall Restrictions Weight Bearing Restrictions: Yes      Mobility Bed Mobility Overal bed mobility: Modified Independent                Transfers Overall transfer level: Modified independent Equipment used: None                  Balance Overall balance assessment: No apparent balance deficits (not formally assessed)                                          ADL Overall ADL's : At baseline                                       General ADL Comments: Pt able to complete ADLs with supervision     Vision Vision Assessment?: No apparent visual deficits   Perception     Praxis      Pertinent Vitals/Pain Pain Assessment: No/denies pain     Hand Dominance Right   Extremity/Trunk Assessment Upper Extremity Assessment Upper Extremity Assessment: Overall WFL for tasks assessed   Lower Extremity Assessment Lower Extremity Assessment: Overall WFL for tasks assessed       Communication Communication Communication: Expressive difficulties (Pt reports speech deficits at baseline from past CVA)    Cognition Arousal/Alertness: Awake/alert Behavior During Therapy: Impulsive;Agitated Overall Cognitive Status: Within Functional Limits for tasks assessed                     General Comments       Exercises       Shoulder Instructions      Home Living Family/patient expects to be discharged to:: Private residence Living Arrangements: Non-relatives/Friends Available Help at Discharge: Friend(s);Available PRN/intermittently Type of Home: House Home Access: Stairs to enter [employer] of Steps: 6-7 Entrance Stairs-Rails: None Home Layout: One level     Bathroom Shower/Tub: Tub/shower unit;Curtain Shower/tub characteristics: Architectural technologist: Standard     Home Equipment: None          Prior Functioning/Environment Level of Independence: Independent        Comments: Pt does not drive; reports he is currently on disability and not working    OT Diagnosis: Generalized weakness   OT Problem List: Decreased strength   OT Treatment/Interventions:      OT Goals(Current goals can be found in the care plan section) Acute Rehab OT Goals Patient Stated Goal: To get out of here OT Goal Formulation: With  patient Time For Goal Achievement: 12/17/14 Potential to Achieve Goals: Good  OT Frequency:     Barriers to D/C:            Co-evaluation              End of Session Equipment Utilized During Treatment: Gait belt Nurse Communication: Mobility status  Activity Tolerance: Patient tolerated treatment well Patient left: in bed;with call bell/phone within reach;with bed alarm set   Time: 1610-9604 OT Time Calculation (min): 13 min Charges:  OT General Charges $OT Visit: 1 Procedure OT Evaluation $Initial OT Evaluation Tier I: 1 Procedure G-Codes:    Forest Gleason 12/03/2014, 9:09 AM

## 2014-12-03 NOTE — Progress Notes (Signed)
STROKE TEAM PROGRESS NOTE   HISTORY [name] is a 59 y.o. 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 Patient does not take ASA. He went to sleep last night feeling normal. He awoke this AM with both dysarthria, dysphagia and decreased sensation of right face and arm. Patient came to ED for fear he may have had a stroke.   Date last known well: Date: 12/01/2014 Time last known well: Time: 23:30 tPA Given: No: out of window Modified Rankin: Rankin Score=1   SUBJECTIVE (INTERVAL HISTORY) No family members present. The patient states that his deficits have resolved. He is anxious for discharge. Smoking cessation discussed.    OBJECTIVE Temp:  [97.6 F (36.4 C)-99 F (37.2 C)] 97.8 F (36.6 C) (07/07 0920) Pulse Rate:  [79-114] 82 (07/07 0920) Cardiac Rhythm:  [-] Normal sinus rhythm (07/06 2003) Resp:  [14-23] 20 (07/07 0920) BP: (121-187)/(76-106) 150/86 mmHg (07/07 0920) SpO2:  [95 %-100 %] 98 % (07/07 0920) Weight:  [74.844 kg (165 lb)] 74.844 kg (165 lb) (07/06 1228)   Recent Labs Lab 12/02/14 2147 12/03/14 0031 12/03/14 0424 12/03/14 0809 12/03/14 1119  GLUCAP 81 80 88 101* 119*    Recent Labs Lab 12/02/14 1241  NA 139  K 3.9  CL 107  CO2 24  GLUCOSE 133*  BUN 17  CREATININE 0.98  CALCIUM 9.4    Recent Labs Lab 12/02/14 1241  AST 51*  ALT 48  ALKPHOS 86  BILITOT 1.1  PROT 7.2  ALBUMIN 3.7    Recent Labs Lab 12/02/14 1241  WBC 5.0  NEUTROABS 3.1  HGB 14.2  HCT 40.2  MCV 91.0  PLT 204   No results for input(s): CKTOTAL, CKMB, CKMBINDEX, TROPONINI in the last 168 hours.  Recent Labs  12/02/14 1241  LABPROT 13.2  INR 0.98    Recent Labs  12/02/14 1447  COLORURINE AMBER*  LABSPEC 1.027  PHURINE 5.5  GLUCOSEU NEGATIVE  HGBUR NEGATIVE  BILIRUBINUR SMALL*  KETONESUR 15*  PROTEINUR NEGATIVE  UROBILINOGEN 1.0  NITRITE NEGATIVE  LEUKOCYTESUR NEGATIVE        Component Value Date/Time   CHOL 137 12/03/2014 0320   TRIG 95 12/03/2014 0320   HDL 26* 12/03/2014 0320   CHOLHDL 5.3 12/03/2014 0320   VLDL 19 12/03/2014 0320   LDLCALC 92 12/03/2014 0320   Lab Results  Component Value Date   HGBA1C 5.1 10/17/2013      Component Value Date/Time   LABOPIA NONE DETECTED 12/02/2014 1447   COCAINSCRNUR NONE DETECTED 12/02/2014 1447   LABBENZ NONE DETECTED 12/02/2014 1447   AMPHETMU NONE DETECTED 12/02/2014 1447   THCU NONE DETECTED 12/02/2014 1447   LABBARB NONE DETECTED 12/02/2014 1447     Recent Labs Lab 12/02/14 1241  ETH <5       Imaging  Ct Head Wo Contrast 12/02/2014    1. No acute intracranial abnormalities.  2. Chronic microvascular ischemic changes in the cerebral white matter, as above.  3. Small right mastoid effusion.    Mr Jeri Cos Wo Contrast 12/03/2014    MRI HEAD IMPRESSION:   1. 6 mm acute ischemic nonhemorrhagic lacunar type infarct within the left thalamus without significant mass effect.  2. Remote left basal ganglia and right cerebellar infarcts as above.  3. Generalized cerebral atrophy with mild to moderate chronic microvascular ischemic disease   MRA HEAD IMPRESSION:   1. No large vessel or proximal arterial branch occlusion  identified within the intracranial circulation.  2. Short-segment moderate stenosis within a proximal right M2 branch at the base of the right sylvian fissure.  3. No other hemodynamically significant stenosis identified.  4. Distal branch atheromatous irregularity within the MCA and PCA branches bilaterally.  5. Fetal origin of the left PCA.     CUS pending  2D echo pending   PHYSICAL EXAM   Temp:  [97.7 F (36.5 C)-99 F (37.2 C)] 97.8 F (36.6 C) (07/07 0920) Pulse Rate:  [79-102] 82 (07/07 0920) Resp:  [14-23] 20 (07/07 0920) BP: (121-187)/(76-106) 150/86 mmHg (07/07 0920) SpO2:  [95 %-100 %] 98 % (07/07 0920)  General - Well nourished, well developed, in no apparent  distress.  Ophthalmologic - Fundi not visualized due to eye movement.  Cardiovascular - Regular rate and rhythm with no murmur.  Mental Status -  Level of arousal and orientation to time, place, and person were intact. Language including expression, naming, repetition, comprehension was assessed and found intact. Fund of Knowledge was assessed and was intact.  Cranial Nerves II - XII - II - Visual field intact OU. III, IV, VI - Extraocular movements intact. V - Facial sensation intact bilaterally. VII - Facial movement intact bilaterally. VIII - Hearing & vestibular intact bilaterally. X - Palate elevates symmetrically, but mild dysarthria. XI - Chin turning & shoulder shrug intact bilaterally. XII - Tongue protrusion intact.  Motor Strength - The patient's strength was normal in all extremities and pronator drift was absent.  Bulk was normal and fasciculations were absent.   Motor Tone - Muscle tone was assessed at the neck and appendages and was normal.  Reflexes - The patient's reflexes were 1+ in all extremities and he had no pathological reflexes.  Sensory - Light touch, temperature/pinprick were assessed and were symmetrical.    Coordination - The patient had normal movements in the hands and feet with no ataxia or dysmetria.  Tremor was absent.  Gait and Station - deferred, but pt goes to bathroom without assistance.  ASSESSMENT/PLAN [name] is a 59 y.o. male with history of tobacco use, previous right thalamic infarcts, history of lung cancer, hypertension,  presenting with dysarthria and right facial and right upper extremity numbness. He did not receive IV t-PA due to late presentation.  Stroke:  Left thalamic infarct likely due to small vessel disease.  Resultant - deficit resolved   MRI  6 mm acute ischemic nonhemorrhagic lacunar type infarct within the left thalamus.  MRA  No large vessel or proximal arterial branch occlusion identified within the  intracranial circulation.   Carotid Doppler pending  2D Echo pending  LDL 92  HgbA1c pending  Subcutaneous heparin for VTE prophylaxis Diet regular Room service appropriate?: Yes; Fluid consistency:: Thin  no antithrombotic prior to admission, now on aspirin 325 mg orally every day.  Patient counseled to be compliant with his antithrombotic medications  Ongoing aggressive stroke risk factor management  Therapy recommendations: Pending  Disposition: Pending  Hypertension  Home meds: Chlorothiazide Permissive hypertension <220/120 for 24-48 hours and then gradually normalize within 5-7 days  Patient counseled to be compliant with his blood pressure medications  Hyperlipidemia  Home meds: No lipid lowering medications prior to admission  LDL 92, goal < 70  Add Lipitor 20 mg daily   Continue statin at discharge  Tobacco abuse  Current smoker  Smoking cessation counseling provided  Pt is willing to quit  Other Stroke Risk Factors  Hx of stroke in 2012  with right PLIC and left MCA punctate infarcts, still more consistent with small vessel disease as pt has stroke risk factors (HTN and smoking).  Other Active Problems    Other Pertinent History   Hospital day # [place] [facility] Pager [phone] 12/03/2014, 12:04 PM  I, the attending vascular neurologist, have personally obtained a history, examined the patient, evaluated laboratory data, individually viewed imaging studies and agree with radiology interpretations. Together with the NP/PA, we formulated the assessment and plan of care which reflects our mutual decision.  I have made any additions or clarifications directly to the above note and agree with the findings and plan as currently documented.   59 yo M with hx of smoker, HTN, stroke in 2012, lung cancer in 2006 was admitted for left thalamic infarcts. Symptoms resolved. Stroke in 2012 involved right PLIC and left  MCA punctate infarcts. Stroke etiology still more likely small vessel disease due to risk factor of HTN and smoking. Pending CUS, TTE and A1C. LDL 92, put on lipitor. Continue ASA '325mg'$ .  Rosalin Hawking, MD PhD Stroke Neurology 12/03/2014 12:43 PM     To contact Stroke Continuity provider, please refer to [email or web address] After hours, contact General Neurology

## 2014-12-03 NOTE — Progress Notes (Deleted)
Order to cancel evaluation received.  Please reorder if indicated. Luanna Salk, [place] [facility] SLP [phone]

## 2014-12-03 NOTE — Evaluation (Signed)
SLP Cancellation Note  Patient Details Name: [name] MRN: [id number] DOB: 04-09-1956   Cancelled treatment:       Reason Eval/Treat Not Completed: Other (comment) (pt walking with PT at this time, will reattempt SLE/cog eval at later time)   Luanna Salk, [place] [facility] SLP [phone]

## 2014-12-03 NOTE — Progress Notes (Signed)
Pt wanted to leave AMA. Pt was informed of the risks and pt verbalize understanding. Alert and oriented.  IV lines removed. MD was notified.  Docia Barrier, Therapist, sports.

## 2014-12-04 ENCOUNTER — Encounter: Payer: Self-pay | Admitting: Family Medicine

## 2014-12-04 ENCOUNTER — Ambulatory Visit (INDEPENDENT_AMBULATORY_CARE_PROVIDER_SITE_OTHER): Payer: Medicare Other | Admitting: Family Medicine

## 2014-12-04 VITALS — BP 139/82 | HR 97 | Temp 97.6°F | Ht 66.0 in | Wt 161.3 lb

## 2014-12-04 DIAGNOSIS — Z23 Encounter for immunization: Secondary | ICD-10-CM | POA: Diagnosis not present

## 2014-12-04 DIAGNOSIS — I639 Cerebral infarction, unspecified: Secondary | ICD-10-CM | POA: Diagnosis not present

## 2014-12-04 DIAGNOSIS — Z720 Tobacco use: Secondary | ICD-10-CM

## 2014-12-04 DIAGNOSIS — Z87891 Personal history of nicotine dependence: Secondary | ICD-10-CM

## 2014-12-04 DIAGNOSIS — J449 Chronic obstructive pulmonary disease, unspecified: Secondary | ICD-10-CM | POA: Diagnosis not present

## 2014-12-04 DIAGNOSIS — B351 Tinea unguium: Secondary | ICD-10-CM | POA: Diagnosis present

## 2014-12-04 LAB — HEMOGLOBIN A1C
Hgb A1c MFr Bld: 5.7 % — ABNORMAL HIGH (ref 4.8–5.6)
MEAN PLASMA GLUCOSE: 117 mg/dL

## 2014-12-04 MED ORDER — ATORVASTATIN CALCIUM 40 MG PO TABS: 40.0000 mg | ORAL_TABLET | Freq: Every day | ORAL | Status: DC

## 2014-12-04 MED ORDER — ASPIRIN 325 MG PO TBEC: 325.0000 mg | DELAYED_RELEASE_TABLET | Freq: Every day | ORAL | Status: DC

## 2014-12-04 MED ORDER — BUPROPION HCL 75 MG PO TABS: 75.0000 mg | ORAL_TABLET | Freq: Two times a day (BID) | ORAL | Status: DC

## 2014-12-04 NOTE — Patient Instructions (Signed)
Start taking one 325 mg aspirin every day. Quit smoking I sent in a new prescription for a cholesterol medicine - atorvastatin.  Start that now Quit smoking I sent in a new nerve medicine that may help you quit smoking.   Quit smoking.

## 2014-12-04 NOTE — Assessment & Plan Note (Signed)
Long talk.  He states he is under too much pressure to quit.  Added wellbutrin for both anxiety and smoking cessation.

## 2014-12-04 NOTE — Progress Notes (Signed)
   Subjective:    Patient ID: [name], male    DOB: Oct 23, 1955, 59 y.o.   MRN: [id number]  HPI Hospitalized 2 days ago with new acute lacunar CVA.  Left AMA.  Feeling better, Still a little right facial weakness and C/O right had numbness.  No dysarthria or dysphagia.   He had previously been on ASA (old stroke, secondary prevention.) Had stopped on own.  Never on statin.  Hosp lipid panel shows low HDL.  Still needs statin.    Review of Systems     Objective:   Physical Exam I can barely pick up on facial weakness. Rt upper ext norma.       Assessment & Plan:

## 2014-12-04 NOTE — Assessment & Plan Note (Signed)
Restart ASA.  Start statin. DC tobacco.

## 2014-12-08 ENCOUNTER — Telehealth: Payer: Self-pay | Admitting: Family Medicine

## 2014-12-08 NOTE — Telephone Encounter (Signed)
Pt called because he wants to stop smoking. He is taking some pills for that and now was also given the patches. He said the pharmacy said he could use both but he wants to make sure. He would like to speak to one of the nurses about this. jw

## 2014-12-08 NOTE — Telephone Encounter (Signed)
Precept with Dr. Lindell Noe; pt is using the nicotine patch and taking Wellbutrin 75 mg BID.  Per Dr. Lindell Noe; both are safe to use together for smoking cession.  Pt stated understanding.  Derl Barrow, RN

## 2014-12-17 ENCOUNTER — Telehealth: Payer: Self-pay | Admitting: Family Medicine

## 2014-12-17 NOTE — Telephone Encounter (Signed)
Pt informed and will come pick up RX tomorrow. Shakaria Raphael Kennon Holter, CMA

## 2014-12-17 NOTE — Telephone Encounter (Signed)
Pt called and would like to have a blood pressure cuff order so that he can monitor his BP at home instead of having to go to the drug strore. jw

## 2014-12-17 NOTE — Telephone Encounter (Signed)
I will hand write a prescription.  I am not sure if his insurance will cover.  We do not have blood pressure cuffs through our office.

## 2015-01-06 ENCOUNTER — Encounter: Payer: Self-pay | Admitting: Family Medicine

## 2015-01-06 ENCOUNTER — Ambulatory Visit (INDEPENDENT_AMBULATORY_CARE_PROVIDER_SITE_OTHER): Payer: Medicare Other | Admitting: Family Medicine

## 2015-01-06 VITALS — BP 139/83 | HR 94 | Temp 97.7°F | Ht 66.0 in | Wt 163.4 lb

## 2015-01-06 DIAGNOSIS — I10 Essential (primary) hypertension: Secondary | ICD-10-CM | POA: Diagnosis not present

## 2015-01-06 DIAGNOSIS — I639 Cerebral infarction, unspecified: Secondary | ICD-10-CM | POA: Diagnosis not present

## 2015-01-06 DIAGNOSIS — Z720 Tobacco use: Secondary | ICD-10-CM

## 2015-01-06 DIAGNOSIS — J449 Chronic obstructive pulmonary disease, unspecified: Secondary | ICD-10-CM | POA: Diagnosis not present

## 2015-01-06 DIAGNOSIS — E7800 Pure hypercholesterolemia, unspecified: Secondary | ICD-10-CM

## 2015-01-06 DIAGNOSIS — B351 Tinea unguium: Secondary | ICD-10-CM | POA: Diagnosis present

## 2015-01-06 DIAGNOSIS — Z87891 Personal history of nicotine dependence: Secondary | ICD-10-CM

## 2015-01-06 DIAGNOSIS — Z23 Encounter for immunization: Secondary | ICD-10-CM | POA: Diagnosis not present

## 2015-01-06 NOTE — Progress Notes (Signed)
   Subjective:    Patient ID: [name], male    DOB: July 04, 1955, 59 y.o.   MRN: [id number]  HPI Patient is doing well. Best news: quit smoking. Taking all meds and tolerating well No new neuro symptoms.  Has some minor right facial numbness, dysarthria and right hand numbness.  Also concerned about brother who was just diagnosed with metastatic cancer.    Review of Systems     Objective:   Physical Exam  BP and wt good. I cannot tell any facial weakness.  Speech is not as crisp as prior to stroke.   Lungs clear Cardiac RRR without m or g        Assessment & Plan:

## 2015-01-06 NOTE — Assessment & Plan Note (Signed)
Successful in quitting.  Continue welbutrin for 2-3 months.

## 2015-01-06 NOTE — Assessment & Plan Note (Signed)
Well controled.  Continue meds

## 2015-01-06 NOTE — Patient Instructions (Signed)
Great job with quitting smoking.   Everything looks good Stay on all your same medications. See me in six months.

## 2015-01-06 NOTE — Assessment & Plan Note (Signed)
Tolerating atorvastatin well - continue

## 2015-01-13 ENCOUNTER — Ambulatory Visit (INDEPENDENT_AMBULATORY_CARE_PROVIDER_SITE_OTHER): Payer: Medicare Other | Admitting: Family Medicine

## 2015-01-13 ENCOUNTER — Encounter: Payer: Self-pay | Admitting: Family Medicine

## 2015-01-13 VITALS — BP 117/74 | HR 104 | Temp 97.7°F | Ht 67.0 in | Wt 161.5 lb

## 2015-01-13 DIAGNOSIS — Z23 Encounter for immunization: Secondary | ICD-10-CM | POA: Diagnosis not present

## 2015-01-13 DIAGNOSIS — Z720 Tobacco use: Secondary | ICD-10-CM | POA: Diagnosis not present

## 2015-01-13 DIAGNOSIS — C3400 Malignant neoplasm of unspecified main bronchus: Secondary | ICD-10-CM | POA: Diagnosis not present

## 2015-01-13 DIAGNOSIS — B351 Tinea unguium: Secondary | ICD-10-CM | POA: Diagnosis present

## 2015-01-13 DIAGNOSIS — I639 Cerebral infarction, unspecified: Secondary | ICD-10-CM | POA: Diagnosis not present

## 2015-01-13 DIAGNOSIS — K219 Gastro-esophageal reflux disease without esophagitis: Secondary | ICD-10-CM | POA: Diagnosis not present

## 2015-01-13 DIAGNOSIS — J449 Chronic obstructive pulmonary disease, unspecified: Secondary | ICD-10-CM | POA: Diagnosis not present

## 2015-01-13 MED ORDER — OMEPRAZOLE 40 MG PO CPDR: 40.0000 mg | DELAYED_RELEASE_CAPSULE | Freq: Every day | ORAL | Status: DC

## 2015-01-13 NOTE — Patient Instructions (Signed)
I sent a new prescription for a higher dose of the omeprazole.  You can use up the supply you have by taking two pills a day. You can only take one ibuprofen every 6 hours.  Too much can irritate your stomach. See me in 2-3 weeks if you still feel bad.  I will want to do a chest Xray and maybe even a CT scan if you still feel bad.

## 2015-01-14 NOTE — Assessment & Plan Note (Signed)
Likely GERD worsened by stress and ibuprofen misuse.  Instructed on ibuprofen and increase omeprazole.

## 2015-01-14 NOTE — Assessment & Plan Note (Signed)
Concern for recurrence always in the back of my mind.  Will recheck in 2-3 weeks and consider further radiologic work up if symptoms remain.

## 2015-01-14 NOTE — Progress Notes (Signed)
   Subjective:    Patient ID: [name], male    DOB: Dec 01, 1955, 59 y.o.   MRN: [id number]  HPI [name] comes in with substernal burning.  Not related to eating/swallowing, exercise, position or activity.  No shortness of breath.  Has history of lung cancer.  Last chest CT was 05/2014, reviewed.  No cough.  No palpitations or diaphoresis.  Issues likely contributing: 1. Improperly taking prescription ibuprofen.  At night he has been taking two 800 mg tabs at once. 2. Stress likely playing a role.  He has just learned that his brother, Shanon Brow, with whom he has a conflictual relationship, is dying of cancer.  Compounding that complicated grief is that they both just lost another brother to cancer.    Review of Systems     Objective:   Physical Exam Lungs clear Abd benign       Assessment & Plan:

## 2015-02-04 ENCOUNTER — Telehealth: Payer: Self-pay | Admitting: *Deleted

## 2015-02-04 MED ORDER — OMEPRAZOLE 20 MG PO CPDR: 20.0000 mg | DELAYED_RELEASE_CAPSULE | Freq: Every day | ORAL | Status: DC

## 2015-02-04 NOTE — Telephone Encounter (Signed)
Received a fax from [facility] stating that patient's insurance will only cover 2 caps of 20 mg a day and not the 40 mg once a day.  If ok to change please send new Rx to [facility].  Derl Barrow, RN

## 2015-02-04 NOTE — Telephone Encounter (Signed)
Will do!

## 2015-02-21 ENCOUNTER — Other Ambulatory Visit: Payer: Self-pay | Admitting: Family Medicine

## 2015-03-08 ENCOUNTER — Ambulatory Visit (INDEPENDENT_AMBULATORY_CARE_PROVIDER_SITE_OTHER): Payer: Medicare Other | Admitting: *Deleted

## 2015-03-08 DIAGNOSIS — Z23 Encounter for immunization: Secondary | ICD-10-CM | POA: Diagnosis not present

## 2015-03-08 DIAGNOSIS — J449 Chronic obstructive pulmonary disease, unspecified: Secondary | ICD-10-CM | POA: Diagnosis not present

## 2015-03-08 DIAGNOSIS — Z720 Tobacco use: Secondary | ICD-10-CM | POA: Diagnosis not present

## 2015-03-08 DIAGNOSIS — B351 Tinea unguium: Secondary | ICD-10-CM | POA: Diagnosis present

## 2015-04-08 ENCOUNTER — Telehealth: Payer: Self-pay | Admitting: *Deleted

## 2015-04-08 ENCOUNTER — Encounter: Payer: Self-pay | Admitting: Family Medicine

## 2015-04-08 ENCOUNTER — Ambulatory Visit
Admission: RE | Admit: 2015-04-08 | Discharge: 2015-04-08 | Disposition: A | Discharge status: Home / Self Care | Payer: Medicare Other | Source: Ambulatory Visit | Attending: Family Medicine | Admitting: Family Medicine

## 2015-04-08 ENCOUNTER — Ambulatory Visit (INDEPENDENT_AMBULATORY_CARE_PROVIDER_SITE_OTHER): Payer: Medicare Other | Admitting: Family Medicine

## 2015-04-08 VITALS — BP 138/82 | HR 105 | Temp 98.3°F | Ht 67.0 in | Wt 163.3 lb

## 2015-04-08 DIAGNOSIS — M79661 Pain in right lower leg: Secondary | ICD-10-CM | POA: Diagnosis not present

## 2015-04-08 DIAGNOSIS — I639 Cerebral infarction, unspecified: Secondary | ICD-10-CM

## 2015-04-08 DIAGNOSIS — J449 Chronic obstructive pulmonary disease, unspecified: Secondary | ICD-10-CM | POA: Diagnosis not present

## 2015-04-08 DIAGNOSIS — N3281 Overactive bladder: Secondary | ICD-10-CM

## 2015-04-08 DIAGNOSIS — G4700 Insomnia, unspecified: Secondary | ICD-10-CM

## 2015-04-08 DIAGNOSIS — B351 Tinea unguium: Secondary | ICD-10-CM | POA: Diagnosis present

## 2015-04-08 DIAGNOSIS — C3491 Malignant neoplasm of unspecified part of right bronchus or lung: Secondary | ICD-10-CM | POA: Diagnosis not present

## 2015-04-08 DIAGNOSIS — F4321 Adjustment disorder with depressed mood: Secondary | ICD-10-CM

## 2015-04-08 DIAGNOSIS — N318 Other neuromuscular dysfunction of bladder: Secondary | ICD-10-CM | POA: Diagnosis not present

## 2015-04-08 DIAGNOSIS — R05 Cough: Secondary | ICD-10-CM | POA: Diagnosis not present

## 2015-04-08 DIAGNOSIS — Z23 Encounter for immunization: Secondary | ICD-10-CM | POA: Diagnosis not present

## 2015-04-08 DIAGNOSIS — Z720 Tobacco use: Secondary | ICD-10-CM | POA: Diagnosis not present

## 2015-04-08 DIAGNOSIS — F4329 Adjustment disorder with other symptoms: Secondary | ICD-10-CM

## 2015-04-08 DIAGNOSIS — Z634 Disappearance and death of family member: Secondary | ICD-10-CM

## 2015-04-08 DIAGNOSIS — F4381 Prolonged grief disorder: Secondary | ICD-10-CM

## 2015-04-08 HISTORY — DX: Pain in right lower leg: M79.661

## 2015-04-08 MED ORDER — FESOTERODINE FUMARATE ER 8 MG PO TB24: 8.0000 mg | ORAL_TABLET | Freq: Every day | ORAL | Status: DC

## 2015-04-08 NOTE — Patient Instructions (Addendum)
I sent in a new prescription for Lisbeth Ply which is to help with the peeing at night.  Stop the tolterodine.  When you come back, tell me which works better. Stop the bupropion for a few weeks.  Let's see if you sleep better off that medicine.   See me in one month.  We will make other adjustments then.
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
## 2015-04-09 DIAGNOSIS — F321 Major depressive disorder, single episode, moderate: Secondary | ICD-10-CM

## 2015-04-09 HISTORY — DX: Major depressive disorder, single episode, moderate: F32.1
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
## 2015-04-09 NOTE — Assessment & Plan Note (Addendum)
For now, just stop bupropion.  F/U in one month for next steps.

## 2015-04-09 NOTE — Telephone Encounter (Signed)
Pt called and states that PCP informed him that he needs another CT scan because "they found spots" on his lungs. Pt would like more info about this. Thank you, Fonda Kinder, ASA

## 2015-04-09 NOTE — Assessment & Plan Note (Signed)
Counseled.  No meds for now.

## 2015-04-09 NOTE — Assessment & Plan Note (Signed)
Now cough.  Will start with CXR.  Results show an area of concern.  Called and LM that he needs CT scan early - ordered.

## 2015-04-09 NOTE — Assessment & Plan Note (Signed)
Doubt acute problem.  Will check X ray, which was normal.

## 2015-04-09 NOTE — Telephone Encounter (Signed)
Noted and patient informed.

## 2015-04-12 ENCOUNTER — Ambulatory Visit (HOSPITAL_COMMUNITY)
Admission: RE | Admit: 2015-04-12 | Discharge: 2015-04-12 | Disposition: A | Discharge status: Home / Self Care | Payer: Medicare Other | Source: Ambulatory Visit | Attending: Family Medicine | Admitting: Family Medicine

## 2015-04-12 ENCOUNTER — Encounter (HOSPITAL_COMMUNITY): Payer: Self-pay

## 2015-04-12 DIAGNOSIS — C3491 Malignant neoplasm of unspecified part of right bronchus or lung: Secondary | ICD-10-CM | POA: Diagnosis not present

## 2015-04-12 DIAGNOSIS — I700 Atherosclerosis of aorta: Secondary | ICD-10-CM | POA: Insufficient documentation

## 2015-04-12 DIAGNOSIS — R918 Other nonspecific abnormal finding of lung field: Secondary | ICD-10-CM | POA: Insufficient documentation

## 2015-04-12 DIAGNOSIS — R05 Cough: Secondary | ICD-10-CM | POA: Diagnosis not present

## 2015-04-12 DIAGNOSIS — I2510 Atherosclerotic heart disease of native coronary artery without angina pectoris: Secondary | ICD-10-CM | POA: Diagnosis not present

## 2015-04-12 LAB — POCT I-STAT CREATININE: CREATININE: 0.9 mg/dL (ref 0.61–1.24)

## 2015-04-12 MED ORDER — IOHEXOL 300 MG/ML  SOLN
75.0000 mL | Freq: Once | INTRAMUSCULAR | Status: AC | PRN
  Administered 2015-04-12: 75 mL via INTRAVENOUS

## 2015-04-13 ENCOUNTER — Telehealth: Payer: Self-pay | Admitting: *Deleted

## 2015-04-13 ENCOUNTER — Telehealth: Payer: Self-pay | Admitting: Family Medicine

## 2015-04-13 DIAGNOSIS — J189 Pneumonia, unspecified organism: Secondary | ICD-10-CM

## 2015-04-13 NOTE — Telephone Encounter (Signed)
returned call to pt, no disease progression pt to follow up in Jan, No further concerns.

## 2015-04-13 NOTE — Telephone Encounter (Signed)
Pt called and would like to know the results of his x-rays. jw

## 2015-04-13 NOTE — Assessment & Plan Note (Signed)
Will get ID input of further testing/treatment

## 2015-04-13 NOTE — Telephone Encounter (Signed)
Pt called states " I had a CT  Scan yesterday and the Dr tod me I have a spot on my lung." Pt has follow up in Jan 2017, requesting MD to review scan and advise. Discussed with pt I will review concerns with MD and call back with further instructions.

## 2015-04-13 NOTE — Telephone Encounter (Signed)
Called and told his CT showed no evidence of recurrent cancer.    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  I am reluctant to just treat based on CT evidence because the treatment course is typically a full year with two antibiotics.  Will refer to ID for suggestions of further WU and treatment.  Patient aware and agrees.  Of note: Patient feels OK.  He is just troubled by a persistent cough.  Of note, last HIV was negative in 2014.

## 2015-04-23 ENCOUNTER — Telehealth: Payer: Self-pay | Admitting: Student

## 2015-04-23 NOTE — Telephone Encounter (Signed)
Pt called asking for updated regarding his last CT chest and concern that he should be taking antibiotics. The last documentation by his PCP was relayed to him. He does have an appointment upcoming on 05/12/2015. All questions were answered to the patient's satisfaction

## 2015-04-29 ENCOUNTER — Other Ambulatory Visit: Payer: Self-pay | Admitting: Family Medicine

## 2015-05-12 ENCOUNTER — Ambulatory Visit (INDEPENDENT_AMBULATORY_CARE_PROVIDER_SITE_OTHER): Payer: Medicare Other | Admitting: Family Medicine

## 2015-05-12 ENCOUNTER — Encounter: Payer: Self-pay | Admitting: Family Medicine

## 2015-05-12 VITALS — BP 134/78 | HR 100 | Temp 97.5°F | Ht 67.0 in | Wt 163.5 lb

## 2015-05-12 DIAGNOSIS — I639 Cerebral infarction, unspecified: Secondary | ICD-10-CM

## 2015-05-12 DIAGNOSIS — J189 Pneumonia, unspecified organism: Secondary | ICD-10-CM

## 2015-05-12 DIAGNOSIS — F4321 Adjustment disorder with depressed mood: Secondary | ICD-10-CM

## 2015-05-12 DIAGNOSIS — Z23 Encounter for immunization: Secondary | ICD-10-CM | POA: Diagnosis not present

## 2015-05-12 DIAGNOSIS — Z720 Tobacco use: Secondary | ICD-10-CM | POA: Diagnosis not present

## 2015-05-12 DIAGNOSIS — F17200 Nicotine dependence, unspecified, uncomplicated: Secondary | ICD-10-CM

## 2015-05-12 DIAGNOSIS — J449 Chronic obstructive pulmonary disease, unspecified: Secondary | ICD-10-CM | POA: Diagnosis not present

## 2015-05-12 DIAGNOSIS — B351 Tinea unguium: Secondary | ICD-10-CM | POA: Diagnosis present

## 2015-05-12 DIAGNOSIS — F4329 Adjustment disorder with other symptoms: Secondary | ICD-10-CM

## 2015-05-12 DIAGNOSIS — Z634 Disappearance and death of family member: Secondary | ICD-10-CM
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
## 2015-05-12 NOTE — Assessment & Plan Note (Signed)
Stable.  Encouraged to keep ID appointment with the CT findings consistent with MAC

## 2015-05-12 NOTE — Assessment & Plan Note (Signed)
Keep ID appointment.  Certainly not worse.  Cough may be a little improved since last visit.

## 2015-05-12 NOTE — Patient Instructions (Signed)
See me after the holidays.  I want to make sure the grief is not getting to you. Do keep the appointment with the infectious disease doctors.   Everything else is looking OK.  Quit smoking

## 2015-05-12 NOTE — Assessment & Plan Note (Signed)
Stable, No SI or HI.  He is not looking forward to the upcoming holidays.

## 2015-05-12 NOTE — Assessment & Plan Note (Signed)
Advised again to quit, but not to jeopardize his sobriety.

## 2015-06-01 ENCOUNTER — Telehealth: Payer: Self-pay | Admitting: Family Medicine

## 2015-06-01 NOTE — Telephone Encounter (Signed)
Got refills on gabapentin and atorvastatin today. Thinks dr Andria Frames took him off one of these but cant remember which one.  Please call to verify which meds he is suppose to be taking

## 2015-06-01 NOTE — Telephone Encounter (Signed)
Called and explained that he needs to stay on both meds.  He has an appointment with me in 2 weeks and will do further med education at that visit.

## 2015-06-02 ENCOUNTER — Ambulatory Visit (INDEPENDENT_AMBULATORY_CARE_PROVIDER_SITE_OTHER): Payer: Medicare Other | Admitting: Infectious Diseases

## 2015-06-02 ENCOUNTER — Encounter: Payer: Self-pay | Admitting: Infectious Diseases

## 2015-06-02 VITALS — BP 132/86 | HR 106 | Ht 66.5 in | Wt 160.1 lb

## 2015-06-02 DIAGNOSIS — B351 Tinea unguium: Secondary | ICD-10-CM | POA: Diagnosis present

## 2015-06-02 DIAGNOSIS — Z720 Tobacco use: Secondary | ICD-10-CM | POA: Diagnosis not present

## 2015-06-02 DIAGNOSIS — Z23 Encounter for immunization: Secondary | ICD-10-CM | POA: Diagnosis not present

## 2015-06-02 DIAGNOSIS — J449 Chronic obstructive pulmonary disease, unspecified: Secondary | ICD-10-CM | POA: Diagnosis not present

## 2015-06-02 DIAGNOSIS — F17200 Nicotine dependence, unspecified, uncomplicated: Secondary | ICD-10-CM | POA: Diagnosis not present

## 2015-06-02 DIAGNOSIS — J189 Pneumonia, unspecified organism: Secondary | ICD-10-CM | POA: Diagnosis not present

## 2015-06-02 NOTE — Assessment & Plan Note (Signed)
Encouraged to quit. 
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
## 2015-06-02 NOTE — Assessment & Plan Note (Signed)
Will - Check HIV Check quantiferon for tb- he has prev been incarcerated, has remote FHx.  Check Sputum afb x3.   Comment- dx of MAI can be difficult- he meets one criteria so far with a consistent CT finding. To complete dx he needs at least 2 sputum positive in next 12 months or 1 BAL.  If he is unable to produce sputum or Cx is (-), he will need BAL.  Will see him back in 6 weeks.

## 2015-06-03 DIAGNOSIS — J189 Pneumonia, unspecified organism: Secondary | ICD-10-CM | POA: Diagnosis not present

## 2015-06-03 LAB — HIV ANTIBODY (ROUTINE TESTING W REFLEX): HIV 1&2 Ab, 4th Generation: NONREACTIVE

## 2015-06-04 ENCOUNTER — Other Ambulatory Visit: Payer: Medicare Other

## 2015-06-04 DIAGNOSIS — J189 Pneumonia, unspecified organism: Secondary | ICD-10-CM | POA: Diagnosis not present

## 2015-06-04 LAB — QUANTIFERON TB GOLD ASSAY (BLOOD)
Interferon Gamma Release Assay: NEGATIVE
Mitogen value: 2.22 IU/mL
QUANTIFERON TB AG MINUS NIL: -0.01 [IU]/mL
Quantiferon Nil Value: 0.04 IU/mL
TB Ag value: 0.03 IU/mL

## 2015-06-16 ENCOUNTER — Ambulatory Visit (INDEPENDENT_AMBULATORY_CARE_PROVIDER_SITE_OTHER): Payer: Medicare Other | Admitting: Family Medicine

## 2015-06-16 ENCOUNTER — Encounter: Payer: Self-pay | Admitting: Family Medicine

## 2015-06-16 VITALS — BP 153/93 | HR 97 | Temp 97.9°F | Wt 161.5 lb

## 2015-06-16 DIAGNOSIS — J449 Chronic obstructive pulmonary disease, unspecified: Secondary | ICD-10-CM | POA: Diagnosis not present

## 2015-06-16 DIAGNOSIS — I739 Peripheral vascular disease, unspecified: Secondary | ICD-10-CM

## 2015-06-16 DIAGNOSIS — R0789 Other chest pain: Secondary | ICD-10-CM

## 2015-06-16 DIAGNOSIS — Z720 Tobacco use: Secondary | ICD-10-CM | POA: Diagnosis not present

## 2015-06-16 DIAGNOSIS — Z23 Encounter for immunization: Secondary | ICD-10-CM | POA: Diagnosis not present

## 2015-06-16 DIAGNOSIS — B182 Chronic viral hepatitis C: Secondary | ICD-10-CM

## 2015-06-16 DIAGNOSIS — B351 Tinea unguium: Secondary | ICD-10-CM | POA: Diagnosis present

## 2015-06-16 DIAGNOSIS — Z1159 Encounter for screening for other viral diseases: Secondary | ICD-10-CM

## 2015-06-16 NOTE — Assessment & Plan Note (Signed)
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

## 2015-06-16 NOTE — Patient Instructions (Addendum)
I am sorry that you had no family for the holidays. For your leg pain on walking, the nurse will arrange a test of your circulation. For your chest, we will wait to see what the new CT scan shows. For your possible exposure to hepatitis, I am doing blood tests.  I will call when I have the test results. It may take up to 6 weeks before you hear the results of your sputum cultures.

## 2015-06-17 ENCOUNTER — Other Ambulatory Visit: Payer: Self-pay | Admitting: Family Medicine

## 2015-06-17 DIAGNOSIS — I739 Peripheral vascular disease, unspecified: Secondary | ICD-10-CM

## 2015-06-17 DIAGNOSIS — R0789 Other chest pain: Secondary | ICD-10-CM | POA: Insufficient documentation

## 2015-06-17 LAB — HEPATITIS B CORE ANTIBODY, TOTAL: HEP B C TOTAL AB: NONREACTIVE

## 2015-06-17 LAB — HEPATITIS C ANTIBODY: HCV AB: REACTIVE — AB

## 2015-06-17 NOTE — Assessment & Plan Note (Signed)
Vascular claudication by history and exam.  Obtain arterial dopplers.
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
## 2015-06-17 NOTE — Assessment & Plan Note (Signed)
Follow.  Of course, interested in follow up CT.  No further workup planned as long as symptoms continue to improve.

## 2015-06-17 NOTE — Assessment & Plan Note (Addendum)
Has risk factors. Labs confirm chronic Hepatitis C.  Already seeing ID for atypical pneumonia.  Patient states he does not have scheduled FU appointment.  Asked him to call and schedule

## 2015-06-18 LAB — HEPATITIS C RNA QUANTITATIVE
HCV Quantitative Log: 6.55 {Log} — ABNORMAL HIGH (ref <1.18)
HCV Quantitative: 3512999 IU/mL — ABNORMAL HIGH (ref <15)

## 2015-06-21 ENCOUNTER — Telehealth: Payer: Self-pay | Admitting: Family Medicine

## 2015-06-21 ENCOUNTER — Encounter: Payer: Self-pay | Admitting: Gastroenterology

## 2015-06-21 NOTE — Telephone Encounter (Signed)
Pt returned phone call.  

## 2015-06-22 NOTE — Telephone Encounter (Signed)
I have spoken with the patient.

## 2015-06-22 NOTE — Telephone Encounter (Signed)
Forwarding this to you. Didn't know if you tried to call pt. [name], CMA

## 2015-06-23 ENCOUNTER — Other Ambulatory Visit (HOSPITAL_BASED_OUTPATIENT_CLINIC_OR_DEPARTMENT_OTHER): Payer: Medicare Other

## 2015-06-23 ENCOUNTER — Telehealth: Payer: Self-pay | Admitting: Family Medicine

## 2015-06-23 ENCOUNTER — Encounter (HOSPITAL_COMMUNITY): Payer: Self-pay

## 2015-06-23 ENCOUNTER — Ambulatory Visit (HOSPITAL_COMMUNITY)
Admission: RE | Admit: 2015-06-23 | Discharge: 2015-06-23 | Disposition: A | Discharge status: Home / Self Care | Payer: Medicare Other | Source: Ambulatory Visit | Attending: Internal Medicine | Admitting: Internal Medicine

## 2015-06-23 DIAGNOSIS — Z923 Personal history of irradiation: Secondary | ICD-10-CM | POA: Insufficient documentation

## 2015-06-23 DIAGNOSIS — C3480 Malignant neoplasm of overlapping sites of unspecified bronchus and lung: Secondary | ICD-10-CM

## 2015-06-23 DIAGNOSIS — J701 Chronic and other pulmonary manifestations due to radiation: Secondary | ICD-10-CM | POA: Diagnosis not present

## 2015-06-23 DIAGNOSIS — I2510 Atherosclerotic heart disease of native coronary artery without angina pectoris: Secondary | ICD-10-CM | POA: Diagnosis not present

## 2015-06-23 DIAGNOSIS — J432 Centrilobular emphysema: Secondary | ICD-10-CM | POA: Diagnosis not present

## 2015-06-23 DIAGNOSIS — C3411 Malignant neoplasm of upper lobe, right bronchus or lung: Secondary | ICD-10-CM | POA: Insufficient documentation

## 2015-06-23 DIAGNOSIS — Z85118 Personal history of other malignant neoplasm of bronchus and lung: Secondary | ICD-10-CM | POA: Diagnosis present

## 2015-06-23 DIAGNOSIS — J439 Emphysema, unspecified: Secondary | ICD-10-CM | POA: Diagnosis not present

## 2015-06-23 DIAGNOSIS — N200 Calculus of kidney: Secondary | ICD-10-CM | POA: Insufficient documentation

## 2015-06-23 LAB — COMPREHENSIVE METABOLIC PANEL
ALBUMIN: 3.7 g/dL (ref 3.5–5.0)
ALK PHOS: 122 U/L (ref 40–150)
ALT: 38 U/L (ref 0–55)
ANION GAP: 7 meq/L (ref 3–11)
AST: 35 U/L — ABNORMAL HIGH (ref 5–34)
BILIRUBIN TOTAL: 0.42 mg/dL (ref 0.20–1.20)
BUN: 19.6 mg/dL (ref 7.0–26.0)
CALCIUM: 9.1 mg/dL (ref 8.4–10.4)
CO2: 26 mEq/L (ref 22–29)
Chloride: 105 mEq/L (ref 98–109)
Creatinine: 1 mg/dL (ref 0.7–1.3)
EGFR: 85 mL/min/{1.73_m2} — AB (ref >90)
GLUCOSE: 106 mg/dL (ref 70–140)
Potassium: 4.1 mEq/L (ref 3.5–5.1)
Sodium: 138 mEq/L (ref 136–145)
TOTAL PROTEIN: 7.8 g/dL (ref 6.4–8.3)

## 2015-06-23 LAB — CBC WITH DIFFERENTIAL/PLATELET
BASO%: 0.9 % (ref 0.0–2.0)
Basophils Absolute: 0 10*3/uL (ref 0.0–0.1)
EOS%: 1.9 % (ref 0.0–7.0)
Eosinophils Absolute: 0.1 10*3/uL (ref 0.0–0.5)
HCT: 38.1 % — ABNORMAL LOW (ref 38.4–49.9)
HGB: 12.5 g/dL — ABNORMAL LOW (ref 13.0–17.1)
LYMPH%: 27.9 % (ref 14.0–49.0)
MCH: 30.4 pg (ref 27.2–33.4)
MCHC: 32.8 g/dL (ref 32.0–36.0)
MCV: 92.7 fL (ref 79.3–98.0)
MONO#: 0.4 10*3/uL (ref 0.1–0.9)
MONO%: 8.6 % (ref 0.0–14.0)
NEUT#: 2.7 10*3/uL (ref 1.5–6.5)
NEUT%: 60.7 % (ref 39.0–75.0)
Platelets: 230 10*3/uL (ref 140–400)
RBC: 4.1 10*6/uL — AB (ref 4.20–5.82)
RDW: 12.9 % (ref 11.0–14.6)
WBC: 4.5 10*3/uL (ref 4.0–10.3)
lymph#: 1.3 10*3/uL (ref 0.9–3.3)

## 2015-06-23 NOTE — Telephone Encounter (Signed)
Will forward to MD to see if paperwork has been received. Emmaus Brandi,CMA

## 2015-06-23 NOTE — Telephone Encounter (Signed)
Called.  ID appointment scheduler did not know about hep C issue.  I assured patient that Dr. Johnnye Sima is aware.

## 2015-06-23 NOTE — Telephone Encounter (Signed)
Recently dx with Hep C and would like to speak to Dr. Andria Frames about it and about some paperwork that was supposed to be faxed off (not really able to get concise reasoning for paperwork). Please advise.

## 2015-06-24 ENCOUNTER — Other Ambulatory Visit: Payer: Self-pay | Admitting: Family Medicine

## 2015-06-24 DIAGNOSIS — B182 Chronic viral hepatitis C: Secondary | ICD-10-CM

## 2015-06-25 ENCOUNTER — Ambulatory Visit (HOSPITAL_COMMUNITY)
Admission: RE | Admit: 2015-06-25 | Discharge: 2015-06-25 | Disposition: A | Discharge status: Home / Self Care | Payer: Medicare Other | Source: Ambulatory Visit | Attending: Family Medicine | Admitting: Family Medicine

## 2015-06-25 DIAGNOSIS — I10 Essential (primary) hypertension: Secondary | ICD-10-CM | POA: Diagnosis not present

## 2015-06-25 DIAGNOSIS — I771 Stricture of artery: Secondary | ICD-10-CM | POA: Insufficient documentation

## 2015-06-25 DIAGNOSIS — R938 Abnormal findings on diagnostic imaging of other specified body structures: Secondary | ICD-10-CM | POA: Diagnosis not present

## 2015-06-25 DIAGNOSIS — I739 Peripheral vascular disease, unspecified: Secondary | ICD-10-CM | POA: Diagnosis not present

## 2015-06-30 ENCOUNTER — Encounter: Payer: Self-pay | Admitting: Internal Medicine

## 2015-06-30 ENCOUNTER — Ambulatory Visit (HOSPITAL_BASED_OUTPATIENT_CLINIC_OR_DEPARTMENT_OTHER): Payer: Medicare Other | Admitting: Internal Medicine

## 2015-06-30 VITALS — BP 130/82 | HR 94 | Temp 97.7°F | Resp 18 | Wt 161.9 lb

## 2015-06-30 DIAGNOSIS — C3412 Malignant neoplasm of upper lobe, left bronchus or lung: Secondary | ICD-10-CM

## 2015-06-30 DIAGNOSIS — I2510 Atherosclerotic heart disease of native coronary artery without angina pectoris: Secondary | ICD-10-CM | POA: Diagnosis not present

## 2015-06-30 DIAGNOSIS — Z85118 Personal history of other malignant neoplasm of bronchus and lung: Secondary | ICD-10-CM

## 2015-06-30 DIAGNOSIS — I2584 Coronary atherosclerosis due to calcified coronary lesion: Principal | ICD-10-CM

## 2015-06-30 HISTORY — DX: Atherosclerotic heart disease of native coronary artery without angina pectoris: I25.10
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
## 2015-07-04 ENCOUNTER — Telehealth: Payer: Self-pay | Admitting: Internal Medicine

## 2015-07-04 NOTE — Telephone Encounter (Signed)
Patient already on schedule for f/u with Dr. [name] 2/14. No other orders per 2/1 pof.

## 2015-07-13 ENCOUNTER — Ambulatory Visit: Payer: Medicare Other | Admitting: Cardiovascular Disease

## 2015-07-14 ENCOUNTER — Encounter: Payer: Self-pay | Admitting: Cardiovascular Disease

## 2015-07-14 ENCOUNTER — Ambulatory Visit (INDEPENDENT_AMBULATORY_CARE_PROVIDER_SITE_OTHER): Payer: Medicare Other | Admitting: Cardiovascular Disease

## 2015-07-14 ENCOUNTER — Ambulatory Visit (INDEPENDENT_AMBULATORY_CARE_PROVIDER_SITE_OTHER): Payer: Medicare Other | Admitting: Infectious Diseases

## 2015-07-14 ENCOUNTER — Encounter: Payer: Self-pay | Admitting: Infectious Diseases

## 2015-07-14 VITALS — BP 168/99 | HR 101 | Temp 97.6°F | Ht 67.0 in | Wt 158.0 lb

## 2015-07-14 VITALS — BP 142/90 | HR 97 | Ht 67.0 in | Wt 158.0 lb

## 2015-07-14 DIAGNOSIS — I2510 Atherosclerotic heart disease of native coronary artery without angina pectoris: Secondary | ICD-10-CM

## 2015-07-14 DIAGNOSIS — Z23 Encounter for immunization: Secondary | ICD-10-CM | POA: Diagnosis not present

## 2015-07-14 DIAGNOSIS — F4329 Adjustment disorder with other symptoms: Secondary | ICD-10-CM

## 2015-07-14 DIAGNOSIS — F4321 Adjustment disorder with depressed mood: Secondary | ICD-10-CM

## 2015-07-14 DIAGNOSIS — I739 Peripheral vascular disease, unspecified: Secondary | ICD-10-CM

## 2015-07-14 DIAGNOSIS — J449 Chronic obstructive pulmonary disease, unspecified: Secondary | ICD-10-CM | POA: Diagnosis not present

## 2015-07-14 DIAGNOSIS — E7800 Pure hypercholesterolemia, unspecified: Secondary | ICD-10-CM | POA: Diagnosis not present

## 2015-07-14 DIAGNOSIS — B182 Chronic viral hepatitis C: Secondary | ICD-10-CM

## 2015-07-14 DIAGNOSIS — I2584 Coronary atherosclerosis due to calcified coronary lesion: Secondary | ICD-10-CM | POA: Diagnosis not present

## 2015-07-14 DIAGNOSIS — J189 Pneumonia, unspecified organism: Secondary | ICD-10-CM

## 2015-07-14 DIAGNOSIS — Z720 Tobacco use: Secondary | ICD-10-CM | POA: Diagnosis not present

## 2015-07-14 DIAGNOSIS — Z634 Disappearance and death of family member: Principal | ICD-10-CM

## 2015-07-14 DIAGNOSIS — B351 Tinea unguium: Secondary | ICD-10-CM | POA: Diagnosis present

## 2015-07-14 LAB — COMPREHENSIVE METABOLIC PANEL
ALBUMIN: 3.9 g/dL (ref 3.6–5.1)
ALK PHOS: 98 U/L (ref 40–115)
ALT: 33 U/L (ref 9–46)
AST: 29 U/L (ref 10–35)
BILIRUBIN TOTAL: 0.5 mg/dL (ref 0.2–1.2)
BUN: 16 mg/dL (ref 7–25)
CALCIUM: 9.5 mg/dL (ref 8.6–10.3)
CO2: 26 mmol/L (ref 20–31)
CREATININE: 0.83 mg/dL (ref 0.70–1.25)
Chloride: 105 mmol/L (ref 98–110)
Glucose, Bld: 97 mg/dL (ref 65–99)
Potassium: 4.8 mmol/L (ref 3.5–5.3)
SODIUM: 139 mmol/L (ref 135–146)
TOTAL PROTEIN: 7.6 g/dL (ref 6.1–8.1)

## 2015-07-14 LAB — HEPATITIS B SURFACE ANTIBODY,QUALITATIVE: HEP B S AB: NEGATIVE

## 2015-07-14 LAB — CBC
HEMATOCRIT: 39.3 % (ref 39.0–52.0)
HEMOGLOBIN: 13.4 g/dL (ref 13.0–17.0)
MCH: 30.5 pg (ref 26.0–34.0)
MCHC: 34.1 g/dL (ref 30.0–36.0)
MCV: 89.5 fL (ref 78.0–100.0)
MPV: 10.3 fL (ref 8.6–12.4)
Platelets: 218 10*3/uL (ref 150–400)
RBC: 4.39 MIL/uL (ref 4.22–5.81)
RDW: 13.1 % (ref 11.5–15.5)
WBC: 4.4 10*3/uL (ref 4.0–10.5)

## 2015-07-14 LAB — HEPATITIS B SURFACE ANTIGEN: Hepatitis B Surface Ag: NEGATIVE

## 2015-07-14 LAB — HEPATITIS A ANTIBODY, TOTAL: HEP A TOTAL AB: BORDERLINE — AB

## 2015-07-14 LAB — IRON: IRON: 71 ug/dL (ref 50–180)
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
## 2015-07-14 NOTE — Assessment & Plan Note (Signed)
History of hyperlipidemia on atorvastatin with his most recent lipid profile performed 12/03/14 revealed a total cholesterol 137, LDL 92 HDL of 26

## 2015-07-14 NOTE — Assessment & Plan Note (Signed)
He appears to be fairly well compensated today.  In last week lost his wife In last year lost his dad, brother.

## 2015-07-14 NOTE — Assessment & Plan Note (Signed)
This appears to be improved on f/u CT Again, gordonae is typically a contaminant.  Will f/u prn

## 2015-07-14 NOTE — Assessment & Plan Note (Signed)
[name] has left lower extreme and claudication more noticeable over the last 6 months. His Dopplers performed in our office 06/25/15 revealed a normal right ABI with a left ABI 0.78 and what appears to be an occluded mid left SFA. He will need angiography and potential endovascular therapy for lifestyle limiting claudication.

## 2015-07-14 NOTE — Progress Notes (Signed)
07/14/2015 [name]   Nov 05, 1955  [id number]  Primary Physician Zigmund Gottron, MD Primary Cardiologist: Lorretta Harp MD Renae Gloss   HPI:  Mr. Duesing is a 60 year old widowed Caucasian male father of 2 children who is disabled because of lung cancer. He was referred by Dr. Andria Frames for peripheral vascular evaluation because of left lower extremity claudication. History of factors include treated hyperlipidemia as well as 50-pack-years of tobacco abuse currently smoking one pack per day. He does have lung cancer and has been treated by Dr. Earlie Server. He's had 3 strokes in the past. His brother died of a myocardial infarction at age 93 last year. He had Dopplers in office performed 06/25/15 which revealed a left ABI 0.78 with an occluded left SFA. Does complain of lifestyle limiting left lower extremity claudication. He has had a CT scan of his chest that shows coronary calcification but has never had documented coronary artery disease.   Current Outpatient Prescriptions  Medication Sig Dispense Refill  . aspirin 325 MG EC tablet Take 1 tablet (325 mg total) by mouth daily.    Marland Kitchen atorvastatin (LIPITOR) 40 MG tablet Take 1 tablet (40 mg total) by mouth daily. 90 tablet 3  . fesoterodine (TOVIAZ) 8 MG TB24 tablet Take 1 tablet (8 mg total) by mouth daily. 30 tablet 3  . gabapentin (NEURONTIN) 300 MG capsule take 1 capsule by mouth at bedtime 90 capsule 3  . ibuprofen (ADVIL,MOTRIN) 800 MG tablet take 1 tablet by mouth every 8 hours if needed for pain 90 tablet 6  . omeprazole (PRILOSEC) 20 MG capsule Take 1 capsule (20 mg total) by mouth daily. 180 capsule 3   No current facility-administered medications for this visit.    No Known Allergies  Social History   Social History  . Marital Status: Divorced    Spouse Name: N/A  . Number of Children: N/A  . Years of Education: N/A   Occupational History  . Not on file.   Social History Main Topics  .  Smoking status: Former Smoker -- 1.00 packs/day for 45 years    Types: Cigarettes    Start date: 05/29/1968  . Smokeless tobacco: Never Used  . Alcohol Use: No     Comment: past per patient none since 2005  . 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 Sexual Activity:    Partners: Female   Other Topics Concern  . Not on file   Social History Narrative   Diabled   Single   2 sons     Review of Systems: General: negative for chills, fever, night sweats or weight changes.  Cardiovascular: negative for chest pain, dyspnea on exertion, edema, orthopnea, palpitations, paroxysmal nocturnal dyspnea or shortness of breath Dermatological: negative for rash Respiratory: negative for cough or wheezing Urologic: negative for hematuria Abdominal: negative for nausea, vomiting, diarrhea, bright red blood per rectum, melena, or hematemesis Neurologic: negative for visual changes, syncope, or dizziness All other systems reviewed and are otherwise negative except as noted above.    Blood pressure 142/90, pulse 97, height '5\' 7"'$  (1.702 m), weight 158 lb (71.668 kg).  General appearance: alert and no distress Neck: no adenopathy, no carotid bruit, no JVD, supple, symmetrical, trachea midline and thyroid not enlarged, symmetric, no tenderness/mass/nodules Lungs: clear to auscultation bilaterally Heart: regular rate and rhythm, S1, S2 normal, no murmur, click, rub or gallop Extremities: extremities normal, atraumatic, no cyanosis or edema ormal sinus rhythm at 97 EKG  with RSR prime in lead V1 suggesting RV conduction delay. I personally reviewed this EKG.  ASSESSMENT AND PLAN:   Left leg claudication [facility]) Mr. Pare has left lower extreme and claudication more noticeable over the last 6 months. His Dopplers performed in our office 06/25/15 revealed a normal right ABI with a left ABI 0.78 and what appears to be an occluded mid left SFA. He will need angiography and potential endovascular therapy  for lifestyle limiting claudication.  Hypercholesteremia History of hyperlipidemia on atorvastatin with his most recent lipid profile performed 12/03/14 revealed a total cholesterol 137, LDL 92 HDL of [place] MD [facility], [facility] 07/14/2015 3:27 PM

## 2015-07-14 NOTE — Assessment & Plan Note (Addendum)
Will complete his screening labs Will check his genotype, fibrosure, Hep A and B ab, Check Iron, ANA Check NS5A (HCV resistance testing) Will see him back in 2 weeks.

## 2015-07-14 NOTE — Patient Instructions (Signed)
Medication Instructions:  Your physician recommends that you continue on your current medications as directed. Please refer to the Current Medication list given to you today.   Labwork: Your physician recommends that you return for lab work in: TODAY The lab can be found on the FIRST FLOOR of out building in [address]   Testing/Procedures: Dr. Gwenlyn Found has ordered a peripheral angiogram to be done at [facility].  This procedure is going to look at the bloodflow in your lower extremities.  If Dr. Gwenlyn Found is able to open up the arteries, you will have to spend one night in the hospital.  If he is not able to open the arteries, you will be able to go home that same day.  ALREADY SCHEDULED IN LAB [facility] 07/15/15  After the procedure, you will not be allowed to drive for 3 days or push, pull, or lift anything greater than 10 lbs for one week.    You will be required to have the following tests prior to the procedure:  1. Blood work-the blood work can be done no more than 7 days prior to the procedure.  It can be done at any [facility] lab.  There is one downstairs on the first floor of this building and one in the [facility] building [phone] [address])    *REPS: SCOTT  Puncture site: Right Groin     Any Other Special Instructions Will Be Listed Below (If Applicable).     If you need a refill on your cardiac medications before your next appointment, please call your pharmacy.

## 2015-07-14 NOTE — Addendum Note (Signed)
Addended by: Newt Minion on: 07/14/2015 04:08 PM   Modules accepted: Orders

## 2015-07-14 NOTE — Telephone Encounter (Signed)
This encounter was created in error - please disregard.

## 2015-07-15 ENCOUNTER — Telehealth: Payer: Self-pay | Admitting: Cardiovascular Disease

## 2015-07-15 ENCOUNTER — Encounter (HOSPITAL_COMMUNITY): Admission: RE | Payer: Self-pay | Source: Ambulatory Visit

## 2015-07-15 ENCOUNTER — Telehealth: Payer: Self-pay | Admitting: *Deleted

## 2015-07-15 ENCOUNTER — Ambulatory Visit (HOSPITAL_COMMUNITY): Admission: RE | Admit: 2015-07-15 | Payer: Medicare Other | Source: Ambulatory Visit | Admitting: Cardiovascular Disease

## 2015-07-15 LAB — CBC WITH DIFFERENTIAL/PLATELET
BASOS ABS: 0 10*3/uL (ref 0.0–0.1)
Basophils Relative: 0 % (ref 0–1)
Eosinophils Absolute: 0.1 10*3/uL (ref 0.0–0.7)
Eosinophils Relative: 2 % (ref 0–5)
HEMATOCRIT: 37.8 % — AB (ref 39.0–52.0)
Hemoglobin: 12.8 g/dL — ABNORMAL LOW (ref 13.0–17.0)
LYMPHS ABS: 1.5 10*3/uL (ref 0.7–4.0)
LYMPHS PCT: 28 % (ref 12–46)
MCH: 30.5 pg (ref 26.0–34.0)
MCHC: 33.9 g/dL (ref 30.0–36.0)
MCV: 90.2 fL (ref 78.0–100.0)
MPV: 10.9 fL (ref 8.6–12.4)
Monocytes Absolute: 0.4 10*3/uL (ref 0.1–1.0)
Monocytes Relative: 7 % (ref 3–12)
NEUTROS ABS: 3.4 10*3/uL (ref 1.7–7.7)
NEUTROS PCT: 63 % (ref 43–77)
PLATELETS: 239 10*3/uL (ref 150–400)
RBC: 4.19 MIL/uL — ABNORMAL LOW (ref 4.22–5.81)
RDW: 13.4 % (ref 11.5–15.5)
WBC: 5.4 10*3/uL (ref 4.0–10.5)

## 2015-07-15 LAB — BASIC METABOLIC PANEL
BUN: 18 mg/dL (ref 7–25)
CHLORIDE: 107 mmol/L (ref 98–110)
CO2: 26 mmol/L (ref 20–31)
Calcium: 9.1 mg/dL (ref 8.6–10.3)
Creat: 0.92 mg/dL (ref 0.70–1.25)
Glucose, Bld: 91 mg/dL (ref 65–99)
POTASSIUM: 4.9 mmol/L (ref 3.5–5.3)
Sodium: 139 mmol/L (ref 135–146)

## 2015-07-15 LAB — ANA: Anti Nuclear Antibody(ANA): POSITIVE — AB

## 2015-07-15 LAB — TSH: TSH: 0.91 mIU/L (ref 0.40–4.50)

## 2015-07-15 LAB — AFB CULTURE WITH SMEAR (NOT AT ARMC): Acid Fast Smear: NONE SEEN

## 2015-07-15 LAB — ANTI-NUCLEAR AB-TITER (ANA TITER): ANA Titer 1: 1:640 {titer} — ABNORMAL HIGH

## 2015-07-15 SURGERY — LOWER EXTREMITY ANGIOGRAPHY

## 2015-07-15 NOTE — Telephone Encounter (Signed)
Pt is going to have Cath on Monday. He wants to know if he will spend the night?

## 2015-07-15 NOTE — Telephone Encounter (Signed)
Spoke with patient regarding lower angiogram scheduled for Monday 07/19/15 @ 8:00 am --NPO after midnight arrive at Short Stay @ 6---pt states he cannot get there until 7:00 am.  Had labs done 07/14/15.  Lm message for Isac Caddy  (rep) regarding time and date of case.

## 2015-07-15 NOTE — Telephone Encounter (Signed)
Pt aware to prepare to spending the night all pending on what is done during procedure. Pt verbalized understanding, no questions at this time.

## 2015-07-16 LAB — AFB CULTURE WITH SMEAR (NOT AT ARMC): ACID FAST SMEAR: NONE SEEN

## 2015-07-17 LAB — LIVER FIBROSIS, FIBROTEST-ACTITEST
ALT: 30 U/L (ref 9–46)
Alpha-2-Macroglobulin: 380 mg/dL — ABNORMAL HIGH (ref 106–279)
Apolipoprotein A1: 95 mg/dL (ref 94–176)
BILIRUBIN: 0.4 mg/dL (ref 0.2–1.2)
Fibrosis Score: 0.71
GGT: 66 U/L (ref 3–70)
Haptoglobin: 176 mg/dL (ref 43–212)
NECROINFLAMMAT ACT SCORE: 0.24
REFERENCE ID: 1442895

## 2015-07-17 LAB — HEPATITIS C GENOTYPE

## 2015-07-18 LAB — AFB CULTURE WITH SMEAR (NOT AT ARMC): Acid Fast Smear: NONE SEEN

## 2015-07-19 ENCOUNTER — Ambulatory Visit (HOSPITAL_COMMUNITY)
Admission: RE | Admit: 2015-07-19 | Discharge: 2015-07-20 | Disposition: A | Discharge status: Home / Self Care | Payer: Medicare Other | Source: Ambulatory Visit | Attending: Cardiovascular Disease | Admitting: Cardiovascular Disease

## 2015-07-19 ENCOUNTER — Encounter (HOSPITAL_COMMUNITY): Payer: Self-pay | Admitting: Cardiovascular Disease

## 2015-07-19 ENCOUNTER — Encounter (HOSPITAL_COMMUNITY)
Admission: RE | Disposition: A | Discharge status: Home / Self Care | Payer: Self-pay | Source: Ambulatory Visit | Attending: Cardiovascular Disease

## 2015-07-19 DIAGNOSIS — I70212 Atherosclerosis of native arteries of extremities with intermittent claudication, left leg: Secondary | ICD-10-CM | POA: Diagnosis present

## 2015-07-19 DIAGNOSIS — F17200 Nicotine dependence, unspecified, uncomplicated: Secondary | ICD-10-CM | POA: Diagnosis present

## 2015-07-19 DIAGNOSIS — I739 Peripheral vascular disease, unspecified: Secondary | ICD-10-CM

## 2015-07-19 DIAGNOSIS — E7800 Pure hypercholesterolemia, unspecified: Secondary | ICD-10-CM

## 2015-07-19 DIAGNOSIS — R000 Tachycardia, unspecified: Secondary | ICD-10-CM | POA: Insufficient documentation

## 2015-07-19 DIAGNOSIS — K219 Gastro-esophageal reflux disease without esophagitis: Secondary | ICD-10-CM | POA: Diagnosis present

## 2015-07-19 DIAGNOSIS — D62 Acute posthemorrhagic anemia: Secondary | ICD-10-CM | POA: Diagnosis not present

## 2015-07-19 DIAGNOSIS — I639 Cerebral infarction, unspecified: Secondary | ICD-10-CM

## 2015-07-19 DIAGNOSIS — Z8249 Family history of ischemic heart disease and other diseases of the circulatory system: Secondary | ICD-10-CM | POA: Insufficient documentation

## 2015-07-19 DIAGNOSIS — I7092 Chronic total occlusion of artery of the extremities: Secondary | ICD-10-CM | POA: Diagnosis not present

## 2015-07-19 DIAGNOSIS — C3490 Malignant neoplasm of unspecified part of unspecified bronchus or lung: Secondary | ICD-10-CM | POA: Diagnosis not present

## 2015-07-19 DIAGNOSIS — Z7982 Long term (current) use of aspirin: Secondary | ICD-10-CM | POA: Diagnosis not present

## 2015-07-19 DIAGNOSIS — Z9221 Personal history of antineoplastic chemotherapy: Secondary | ICD-10-CM | POA: Diagnosis not present

## 2015-07-19 DIAGNOSIS — Z87891 Personal history of nicotine dependence: Secondary | ICD-10-CM | POA: Insufficient documentation

## 2015-07-19 DIAGNOSIS — E785 Hyperlipidemia, unspecified: Secondary | ICD-10-CM | POA: Diagnosis present

## 2015-07-19 DIAGNOSIS — J449 Chronic obstructive pulmonary disease, unspecified: Secondary | ICD-10-CM | POA: Diagnosis present

## 2015-07-19 DIAGNOSIS — Z8673 Personal history of transient ischemic attack (TIA), and cerebral infarction without residual deficits: Secondary | ICD-10-CM

## 2015-07-19 DIAGNOSIS — Z923 Personal history of irradiation: Secondary | ICD-10-CM | POA: Diagnosis not present

## 2015-07-19 HISTORY — DX: Malignant neoplasm of unspecified part of unspecified bronchus or lung: C34.90

## 2015-07-19 HISTORY — DX: Headache: R51

## 2015-07-19 HISTORY — PX: PERIPHERAL VASCULAR CATHETERIZATION: SHX172C

## 2015-07-19 HISTORY — DX: Gastro-esophageal reflux disease without esophagitis: K21.9

## 2015-07-19 HISTORY — DX: Headache, unspecified: R51.9

## 2015-07-19 LAB — PROTIME-INR
INR: 1 (ref 0.00–1.49)
PROTHROMBIN TIME: 13.4 s (ref 11.6–15.2)

## 2015-07-19 LAB — POCT ACTIVATED CLOTTING TIME
ACTIVATED CLOTTING TIME: 224 s
ACTIVATED CLOTTING TIME: 255 s
ACTIVATED CLOTTING TIME: 183 s
Activated Clotting Time: 229 seconds
Activated Clotting Time: 260 seconds
Activated Clotting Time: 167 seconds

## 2015-07-19 LAB — MRSA PCR SCREENING: MRSA by PCR: NEGATIVE

## 2015-07-19 SURGERY — LOWER EXTREMITY ANGIOGRAPHY

## 2015-07-19 MED ORDER — ACETAMINOPHEN 325 MG PO TABS
650.0000 mg | ORAL_TABLET | ORAL | Status: DC | PRN
  Filled 2015-07-19: qty 2

## 2015-07-19 MED ORDER — HEPARIN SODIUM (PORCINE) 1000 UNIT/ML IJ SOLN
INTRAMUSCULAR | Status: AC
  Filled 2015-07-19: qty 1

## 2015-07-19 MED ORDER — SODIUM CHLORIDE 0.9% FLUSH: 3.0000 mL | INTRAVENOUS | Status: DC | PRN

## 2015-07-19 MED ORDER — CLOPIDOGREL BISULFATE 300 MG PO TABS
ORAL_TABLET | ORAL | Status: AC
  Filled 2015-07-19: qty 1

## 2015-07-19 MED ORDER — FENTANYL CITRATE (PF) 100 MCG/2ML IJ SOLN
INTRAMUSCULAR | Status: AC
  Filled 2015-07-19: qty 2

## 2015-07-19 MED ORDER — MIDAZOLAM HCL 2 MG/2ML IJ SOLN
INTRAMUSCULAR | Status: AC
  Filled 2015-07-19: qty 2

## 2015-07-19 MED ORDER — PANTOPRAZOLE SODIUM 40 MG PO TBEC
40.0000 mg | DELAYED_RELEASE_TABLET | Freq: Every day | ORAL | Status: DC
  Administered 2015-07-19 – 2015-07-20 (×2): 40 mg via ORAL
  Filled 2015-07-19 (×3): qty 1

## 2015-07-19 MED ORDER — HEPARIN (PORCINE) IN NACL 2-0.9 UNIT/ML-% IJ SOLN
INTRAMUSCULAR | Status: AC
  Filled 2015-07-19: qty 1000

## 2015-07-19 MED ORDER — ONDANSETRON HCL 4 MG/2ML IJ SOLN: 4.0000 mg | Freq: Four times a day (QID) | INTRAMUSCULAR | Status: DC | PRN

## 2015-07-19 MED ORDER — FENTANYL CITRATE (PF) 100 MCG/2ML IJ SOLN
INTRAMUSCULAR | Status: DC | PRN
  Administered 2015-07-19: 25 ug via INTRAVENOUS

## 2015-07-19 MED ORDER — SODIUM CHLORIDE 0.9 % WEIGHT BASED INFUSION: 1.0000 mL/kg/h | INTRAVENOUS | Status: DC

## 2015-07-19 MED ORDER — CLOPIDOGREL BISULFATE 300 MG PO TABS
ORAL_TABLET | ORAL | Status: DC | PRN
  Administered 2015-07-19: 300 mg via ORAL

## 2015-07-19 MED ORDER — SODIUM CHLORIDE 0.9 % IV SOLN
INTRAVENOUS | Status: AC
  Administered 2015-07-19: 17:00:00 via INTRAVENOUS

## 2015-07-19 MED ORDER — HEPARIN SODIUM (PORCINE) 1000 UNIT/ML IJ SOLN
INTRAMUSCULAR | Status: DC | PRN
  Administered 2015-07-19: 2500 [IU] via INTRAVENOUS
  Administered 2015-07-19: 6000 [IU] via INTRAVENOUS
  Administered 2015-07-19: 3000 [IU] via INTRAVENOUS

## 2015-07-19 MED ORDER — IODIXANOL 320 MG/ML IV SOLN
INTRAVENOUS | Status: DC | PRN
  Administered 2015-07-19: 180 mL via INTRA_ARTERIAL

## 2015-07-19 MED ORDER — NICOTINE 14 MG/24HR TD PT24
14.0000 mg | MEDICATED_PATCH | Freq: Every day | TRANSDERMAL | Status: DC
  Administered 2015-07-19: 18:00:00 14 mg via TRANSDERMAL
  Filled 2015-07-19 (×2): qty 1

## 2015-07-19 MED ORDER — CLOPIDOGREL BISULFATE 75 MG PO TABS
75.0000 mg | ORAL_TABLET | Freq: Every day | ORAL | Status: DC
  Administered 2015-07-20: 75 mg via ORAL
  Filled 2015-07-19: qty 1

## 2015-07-19 MED ORDER — MIDAZOLAM HCL 2 MG/2ML IJ SOLN
INTRAMUSCULAR | Status: DC | PRN
  Administered 2015-07-19: 1 mg via INTRAVENOUS

## 2015-07-19 MED ORDER — ATORVASTATIN CALCIUM 40 MG PO TABS
40.0000 mg | ORAL_TABLET | Freq: Every day | ORAL | Status: DC
  Administered 2015-07-19 – 2015-07-20 (×2): 40 mg via ORAL
  Filled 2015-07-19 (×2): qty 1

## 2015-07-19 MED ORDER — HYDRALAZINE HCL 20 MG/ML IJ SOLN
10.0000 mg | INTRAMUSCULAR | Status: DC | PRN
  Administered 2015-07-19 – 2015-07-20 (×2): 10 mg via INTRAVENOUS
  Filled 2015-07-19 (×2): qty 1

## 2015-07-19 MED ORDER — ASPIRIN EC 325 MG PO TBEC: 325.0000 mg | DELAYED_RELEASE_TABLET | Freq: Every day | ORAL | Status: DC

## 2015-07-19 MED ORDER — MORPHINE SULFATE (PF) 2 MG/ML IV SOLN
2.0000 mg | INTRAVENOUS | Status: DC | PRN
  Administered 2015-07-20: 2 mg via INTRAVENOUS
  Filled 2015-07-19: qty 1

## 2015-07-19 MED ORDER — LIDOCAINE HCL (PF) 1 % IJ SOLN
INTRAMUSCULAR | Status: DC | PRN
  Administered 2015-07-19: 26 mL via INTRADERMAL

## 2015-07-19 MED ORDER — ASPIRIN 81 MG PO CHEW
81.0000 mg | CHEWABLE_TABLET | ORAL | Status: DC
Start: 2015-07-19 — End: 2015-07-19

## 2015-07-19 MED ORDER — ASPIRIN EC 325 MG PO TBEC
325.0000 mg | DELAYED_RELEASE_TABLET | Freq: Every day | ORAL | Status: DC
  Administered 2015-07-20: 325 mg via ORAL
  Filled 2015-07-19: qty 1

## 2015-07-19 MED ORDER — LIDOCAINE HCL (PF) 1 % IJ SOLN
INTRAMUSCULAR | Status: AC
  Filled 2015-07-19: qty 30

## 2015-07-19 MED ORDER — HEPARIN (PORCINE) IN NACL 2-0.9 UNIT/ML-% IJ SOLN
INTRAMUSCULAR | Status: DC | PRN
  Administered 2015-07-19: 10:00:00

## 2015-07-19 MED ORDER — HYDRALAZINE HCL 20 MG/ML IJ SOLN
INTRAMUSCULAR | Status: AC
  Filled 2015-07-19: qty 1

## 2015-07-19 MED ORDER — FESOTERODINE FUMARATE ER 8 MG PO TB24
8.0000 mg | ORAL_TABLET | Freq: Every day | ORAL | Status: DC
Start: 2015-07-19 — End: 2015-07-20
  Administered 2015-07-19: 8 mg via ORAL
  Filled 2015-07-19: qty 1

## 2015-07-19 MED ORDER — SODIUM CHLORIDE 0.9 % WEIGHT BASED INFUSION
3.0000 mL/kg/h | INTRAVENOUS | Status: DC
  Administered 2015-07-19: 3 mL/kg/h via INTRAVENOUS

## 2015-07-19 MED ORDER — GABAPENTIN 300 MG PO CAPS
300.0000 mg | ORAL_CAPSULE | Freq: Every day | ORAL | Status: DC
Start: 2015-07-19 — End: 2015-07-20
  Administered 2015-07-19: 300 mg via ORAL
  Filled 2015-07-19: qty 1

## 2015-07-19 MED ORDER — HYDRALAZINE HCL 20 MG/ML IJ SOLN
INTRAMUSCULAR | Status: DC | PRN
  Administered 2015-07-19: 10 mg via INTRAVENOUS

## 2015-07-19 SURGICAL SUPPLY — 26 items
BALLN LUTONIX 5X150X130 (BALLOONS) ×3
BALLOON LUTONIX 5X150X130 (BALLOONS) IMPLANT
CATH ANGIO 5F PIGTAIL 65CM (CATHETERS) ×2 IMPLANT
CATH CROSS OVER TEMPO 5F (CATHETERS) ×2 IMPLANT
CATH HAWKONE LX EXTENDED TIP (CATHETERS) ×2 IMPLANT
CATH VIANCE CROSS STAND 150CM (MICROCATHETER) ×3
CATH VIANCE CROSS STD 150CM (MICROCATHETER) IMPLANT
DEVICE CONTINUOUS FLUSH (MISCELLANEOUS) ×2 IMPLANT
DEVICE SPIDERFX EMB PROT 6MM (WIRE) ×2 IMPLANT
DEVICE TORQUE .014-.018 (MISCELLANEOUS) IMPLANT
GUIDEWIRE ANGLED .035X150CM (WIRE) ×2 IMPLANT
GUIDEWIRE ASTATO XS 20G 300CM (WIRE) ×2 IMPLANT
KIT ENCORE 26 ADVANTAGE (KITS) ×2 IMPLANT
KIT PV (KITS) ×3 IMPLANT
SHEATH HIGHFLEX ANSEL 7FR 55CM (SHEATH) ×2 IMPLANT
SHEATH PINNACLE 5F 10CM (SHEATH) ×2 IMPLANT
SHEATH PINNACLE 7F 10CM (SHEATH) ×2 IMPLANT
SYR MEDRAD MARK V 150ML (SYRINGE) ×3 IMPLANT
TAPE RADIOPAQUE TURBO (MISCELLANEOUS) ×2 IMPLANT
TORQUE DEVICE .014-.018 (MISCELLANEOUS) ×3
TRANSDUCER W/STOPCOCK (MISCELLANEOUS) ×3 IMPLANT
TRAY PV CATH (CUSTOM PROCEDURE TRAY) ×3 IMPLANT
TUBING CIL FLEX 10 FLL-RA (TUBING) ×2 IMPLANT
WIRE HITORQ VERSACORE ST 145CM (WIRE) ×2 IMPLANT
WIRE ROSEN-J .035X180CM (WIRE) ×2 IMPLANT
WIRE SPARTACORE .014X300CM (WIRE) ×2 IMPLANT

## 2015-07-19 NOTE — Progress Notes (Signed)
Site area: right groin  Site Prior to Removal:  Level 0  Pressure Applied For 20 MINUTES    Minutes Beginning at 1325  Manual:   Yes.    Patient Status During Pull:  Stable   Post Pull Groin Site:  Level 0  Post Pull Instructions Given:  Yes.  Patient has a strong nonproductive cough, instructed to call to have site checked after coughing even though he been instructed to hold pressure on the site for coughing or sneezing.   Post Pull Pulses Present:  Yes.    Dressing Applied:  Yes.  Gauze dressing with medipore tape applied  Comments:  [id number] NT called from patient room and reported the patient was bleeding at the groin site. Pressure held for 20 minutes. Patient tolerated well, VSS. Applied pressure dressing, gauze secured with elastoplast. Patient verbalized understanding of post pull instructions again. Checked frequently with no change. Right groin area soft, no bleeding, hematoma or bruising noted. Observed patient holding pressure appropriately before coughing while in the room.

## 2015-07-19 NOTE — Interval H&P Note (Signed)
History and Physical Interval Note:  07/19/2015 8:17 AM  [name]  has presented today for surgery, with the diagnosis of claudication  The various methods of treatment have been discussed with the patient and family. After consideration of risks, benefits and other options for treatment, the patient has consented to  Procedure(s): Lower Extremity Angiography (N/A) as a surgical intervention .  The patient's history has been reviewed, patient examined, no change in status, stable for surgery.  I have reviewed the patient's chart and labs.  Questions were answered to the patient's satisfaction.     [name]

## 2015-07-19 NOTE — H&P (View-Only) (Signed)
07/14/2015 [name]   [birth year]  [id number]  Primary Physician Zigmund Gottron, MD Primary Cardiologist: Douglas Harp MD [name]   HPI:  [name] is a 60 year old widowed Caucasian male father of 2 children who is disabled because of lung cancer. He was referred by [name] for peripheral vascular evaluation because of left lower extremity claudication. History of factors include treated hyperlipidemia as well as 50-pack-years of tobacco abuse currently smoking one pack per day. He does have lung cancer and has been treated by [name]. He's had 3 strokes in the past. His brother died of a myocardial infarction at age 57 last year. He had Dopplers in office performed 06/25/15 which revealed a left ABI 0.78 with an occluded left SFA. Does complain of lifestyle limiting left lower extremity claudication. He has had a CT scan of his chest that shows coronary calcification but has never had documented coronary artery disease.   Current Outpatient Prescriptions  Medication Sig Dispense Refill  . aspirin 325 MG EC tablet Take 1 tablet (325 mg total) by mouth daily.    Marland Kitchen atorvastatin (LIPITOR) 40 MG tablet Take 1 tablet (40 mg total) by mouth daily. 90 tablet 3  . fesoterodine (TOVIAZ) 8 MG TB24 tablet Take 1 tablet (8 mg total) by mouth daily. 30 tablet 3  . gabapentin (NEURONTIN) 300 MG capsule take 1 capsule by mouth at bedtime 90 capsule 3  . ibuprofen (ADVIL,MOTRIN) 800 MG tablet take 1 tablet by mouth every 8 hours if needed for pain 90 tablet 6  . omeprazole (PRILOSEC) 20 MG capsule Take 1 capsule (20 mg total) by mouth daily. 180 capsule 3   No current facility-administered medications for this visit.    No Known Allergies  Social History   Social History  . Marital Status: Divorced    Spouse Name: N/A  . Number of Children: N/A  . Years of Education: N/A   Occupational History  . Not on file.   Social History Main Topics  .  Smoking status: Former Smoker -- 1.00 packs/day for 45 years    Types: Cigarettes    Start date: 05/29/1968  . Smokeless tobacco: Never Used  . Alcohol Use: No     Comment: past per patient none since 2005  . 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 Sexual Activity:    Partners: Female   Other Topics Concern  . Not on file   Social History Narrative   Diabled   Single   2 sons     Review of Systems: General: negative for chills, fever, night sweats or weight changes.  Cardiovascular: negative for chest pain, dyspnea on exertion, edema, orthopnea, palpitations, paroxysmal nocturnal dyspnea or shortness of breath Dermatological: negative for rash Respiratory: negative for cough or wheezing Urologic: negative for hematuria Abdominal: negative for nausea, vomiting, diarrhea, bright red blood per rectum, melena, or hematemesis Neurologic: negative for visual changes, syncope, or dizziness All other systems reviewed and are otherwise negative except as noted above.    Blood pressure 142/90, pulse 97, height '5\' 7"'$  (1.702 m), weight 158 lb (71.668 kg).  General appearance: alert and no distress Neck: no adenopathy, no carotid bruit, no JVD, supple, symmetrical, trachea midline and thyroid not enlarged, symmetric, no tenderness/mass/nodules Lungs: clear to auscultation bilaterally Heart: regular rate and rhythm, S1, S2 normal, no murmur, click, rub or gallop Extremities: extremities normal, atraumatic, no cyanosis or edema ormal sinus rhythm at 97 EKG  with RSR prime in lead V1 suggesting RV conduction delay. I personally reviewed this EKG.  ASSESSMENT AND PLAN:   Left leg claudication [name]) [name] has left lower extreme and claudication more noticeable over the last 6 months. His Dopplers performed in our office 06/25/15 revealed a normal right ABI with a left ABI 0.78 and what appears to be an occluded mid left SFA. He will need angiography and potential endovascular therapy  for lifestyle limiting claudication.  Hypercholesteremia History of hyperlipidemia on atorvastatin with his most recent lipid profile performed 12/03/14 revealed a total cholesterol 137, LDL 92 HDL of [place] MD [facility], [facility] 07/14/2015 3:27 PM

## 2015-07-20 ENCOUNTER — Encounter (HOSPITAL_COMMUNITY): Payer: Self-pay | Admitting: Physician Assistant

## 2015-07-20 ENCOUNTER — Other Ambulatory Visit: Payer: Self-pay | Admitting: Physician Assistant

## 2015-07-20 DIAGNOSIS — Z720 Tobacco use: Secondary | ICD-10-CM

## 2015-07-20 DIAGNOSIS — K219 Gastro-esophageal reflux disease without esophagitis: Secondary | ICD-10-CM | POA: Diagnosis present

## 2015-07-20 DIAGNOSIS — I739 Peripheral vascular disease, unspecified: Secondary | ICD-10-CM

## 2015-07-20 DIAGNOSIS — J449 Chronic obstructive pulmonary disease, unspecified: Secondary | ICD-10-CM | POA: Diagnosis not present

## 2015-07-20 DIAGNOSIS — C3490 Malignant neoplasm of unspecified part of unspecified bronchus or lung: Secondary | ICD-10-CM | POA: Diagnosis not present

## 2015-07-20 DIAGNOSIS — I70212 Atherosclerosis of native arteries of extremities with intermittent claudication, left leg: Secondary | ICD-10-CM | POA: Diagnosis not present

## 2015-07-20 DIAGNOSIS — E785 Hyperlipidemia, unspecified: Secondary | ICD-10-CM | POA: Diagnosis not present

## 2015-07-20 DIAGNOSIS — Z8673 Personal history of transient ischemic attack (TIA), and cerebral infarction without residual deficits: Secondary | ICD-10-CM | POA: Diagnosis not present

## 2015-07-20 LAB — BASIC METABOLIC PANEL
Anion gap: 8 (ref 5–15)
BUN: 18 mg/dL (ref 6–20)
CALCIUM: 8.7 mg/dL — AB (ref 8.9–10.3)
CHLORIDE: 106 mmol/L (ref 101–111)
CO2: 24 mmol/L (ref 22–32)
CREATININE: 0.96 mg/dL (ref 0.61–1.24)
GFR calc Af Amer: >60 mL/min (ref >60)
GFR calc non Af Amer: >60 mL/min (ref >60)
Glucose, Bld: 102 mg/dL — ABNORMAL HIGH (ref 65–99)
Potassium: 3.9 mmol/L (ref 3.5–5.1)
SODIUM: 138 mmol/L (ref 135–145)

## 2015-07-20 LAB — CBC
HCT: 30.4 % — ABNORMAL LOW (ref 39.0–52.0)
Hemoglobin: 10 g/dL — ABNORMAL LOW (ref 13.0–17.0)
MCH: 29.5 pg (ref 26.0–34.0)
MCHC: 32.9 g/dL (ref 30.0–36.0)
MCV: 89.7 fL (ref 78.0–100.0)
PLATELETS: 173 10*3/uL (ref 150–400)
RBC: 3.39 MIL/uL — ABNORMAL LOW (ref 4.22–5.81)
RDW: 13 % (ref 11.5–15.5)
WBC: 3.9 10*3/uL — ABNORMAL LOW (ref 4.0–10.5)

## 2015-07-20 MED ORDER — CLOPIDOGREL BISULFATE 75 MG PO TABS: 75.0000 mg | ORAL_TABLET | Freq: Every day | ORAL | Status: DC

## 2015-07-20 MED ORDER — ASPIRIN 81 MG PO TBEC: 81.0000 mg | DELAYED_RELEASE_TABLET | Freq: Every day | ORAL | Status: DC

## 2015-07-20 NOTE — Progress Notes (Signed)
Discharge instrustions and prescription given to pt.  All questions answered. Vss.  Pt has no pain at present time.

## 2015-07-20 NOTE — Care Management Note (Signed)
Case Management Note  Patient Details  Name: [name] MRN: [id number] Date of Birth: Aug 17, 1955  Subjective/Objective:  Patient is from home, pta indep, on plavix, no needs.                  Action/Plan:   Expected Discharge Date:                  Expected Discharge Plan:  Home/Self Care  In-House Referral:     Discharge planning Services  CM Consult  Post Acute Care Choice:    Choice offered to:     DME Arranged:    DME Agency:     HH Arranged:    [place] Agency:     Status of Service:  Completed, signed off  Medicare Important Message Given:    Date Medicare IM Given:    Medicare IM give by:    Date Additional Medicare IM Given:    Additional Medicare Important Message give by:     If discussed at [place] of Stay Meetings, dates discussed:    Additional Comments:  Zenon Mayo, RN 07/20/2015, 4:38 PM

## 2015-07-20 NOTE — Progress Notes (Signed)
Patient Name: [name] Date of Encounter: 07/20/2015     Active Problems:   Left leg claudication ([place])   Claudication ([place])    SUBJECTIVE  No complaints. Walked around this AM with minimal pain. Tender at groin site. Ready to go home.   CURRENT MEDS . aspirin  325 mg Oral Daily  . atorvastatin  40 mg Oral Daily  . clopidogrel  75 mg Oral Q breakfast  . fesoterodine  8 mg Oral QHS  . gabapentin  300 mg Oral QHS  . nicotine  14 mg Transdermal Daily  . pantoprazole  40 mg Oral Daily    OBJECTIVE  Filed Vitals:   07/19/15 1600 07/19/15 2110 07/20/15 0242 07/20/15 0533  BP: 144/66 159/81 165/83 134/79  Pulse: 108 101 104   Temp: 97.7 F (36.5 C) 97.7 F (36.5 C) 98.4 F (36.9 C)   TempSrc: Oral Oral Oral   Resp: '23 15 20 22  '$ Height:      Weight:   160 lb 15 oz (73 kg)   SpO2: 100% 98% 97%     Intake/Output Summary (Last 24 hours) at 07/20/15 0744 Last data filed at 07/20/15 0244  Gross per 24 hour  Intake    690 ml  Output    650 ml  Net     40 ml   Filed Weights   07/19/15 0628 07/20/15 0242  Weight: 158 lb (71.668 kg) 160 lb 15 oz (73 kg)    PHYSICAL EXAM  General: Pleasant, NAD. Neuro: Alert and oriented X 3. Moves all extremities spontaneously. Psych: Normal affect. HEENT:  Normal  Neck: Supple without bruits or JVD. Lungs:  Resp regular and unlabored, CTA. Heart: tachy, no s3, s4, or murmurs. Abdomen: Soft, non-tender, non-distended, BS + x 4.  Extremities: No clubbing, cyanosis or edema. DP/PT/Radials 2+ and equal bilaterally.  Accessory Clinical Findings  CBC  Recent Labs  07/20/15 0220  WBC 3.9*  HGB 10.0*  HCT 30.4*  MCV 89.7  PLT 350   Basic Metabolic Panel  Recent Labs  07/20/15 0220  NA 138  K 3.9  CL 106  CO2 24  GLUCOSE 102*  BUN 18  CREATININE 0.96  CALCIUM 8.7*    TELE  Sinus tachycardia. HR ~125  Radiology/Studies  Ct Chest Wo Contrast  06/23/2015  CLINICAL DATA:  60 year old male with history  of right-sided lung cancer diagnosed in 2006 status post chemotherapy and radiation therapy completed in 2006. Shortness breath for the past 2 months. Former smoker (quit in 2010) with 43 pack-year history of smoking. EXAM: CT CHEST WITHOUT CONTRAST TECHNIQUE: Multidetector CT imaging of the chest was performed following the standard protocol without IV contrast. COMPARISON:  Chest CT 04/12/2015. FINDINGS: Mediastinum/Lymph Nodes: Heart size is normal. There is no significant pericardial fluid, thickening or pericardial calcification. There is atherosclerosis of the thoracic aorta, the great vessels of the mediastinum and the coronary arteries, including calcified atherosclerotic plaque in the left main, left anterior descending, left circumflex and right coronary arteries. No pathologically enlarged mediastinal or hilar lymph nodes. Esophagus is unremarkable in appearance. Frothy secretions in the right mainstem bronchus. No axillary lymphadenopathy. Lungs/Pleura: The patchy peribronchovascular nodularity noted in the right upper lobe on the prior examination has resolved, presumably infectious or inflammatory at the time of the prior examination. No new suspicious appearing pulmonary nodules or masses are noted on today's examination. No acute consolidative airspace disease. No pleural effusions. Chronic paramediastinal architectural distortion is again noted throughout the right  lung, most evident in the right upper lobe, compatible with chronic postradiation mass-like fibrosis. This appears similar to the most recent prior examinations. Mild diffuse bronchial wall thickening with mild centrilobular and paraseptal emphysema. Upper Abdomen: Status post cholecystectomy. Atherosclerosis. 2 mm nonobstructive calculus in the upper pole collecting system of the right kidney. Musculoskeletal/Soft Tissues: There are no aggressive appearing lytic or blastic lesions noted in the visualized portions of the skeleton.  IMPRESSION: 1. Resolution of the previously noted infectious or inflammatory changes in the right upper lobe seen on prior study 04/12/2015. No acute findings on today's examination. There are frothy secretions in the right mainstem bronchus, but no other acute findings are noted on today's examination. 2. 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 No definite evidence to suggest local recurrence of disease. 3. Mild diffuse bronchial wall thickening with mild centrilobular and paraseptal emphysema; imaging findings suggestive of underlying COPD. 4. Atherosclerosis, including left main and 3 vessel coronary artery disease. Please note that although the presence of coronary artery calcium documents the presence of coronary artery disease, the severity of this disease and any potential stenosis cannot be assessed on this non-gated CT examination. Assessment for potential risk factor modification, dietary therapy or pharmacologic therapy may be warranted, if clinically indicated. 5. 2 mm nonobstructive calculus in the upper pole collecting system of the right kidney. 6. Additional incidental findings, as above. Electronically Signed   By: Vinnie Langton M.D.   On: 06/23/2015 11:35    ASSESSMENT AND PLAN  [name] is a 60 y.o. 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  Left leg claudication: Dopplers performed in our office 06/25/15 revealed a normal right ABI with a left ABI 0.78 and what appears to be an occluded mid left SFA. He is s/p PV angiogram on 07/19/15 with successful mid left SFA chronic total occlusion directional atherectomy followed by drug eluting balloon  angioplasty using distal protection. Plan to discharge him on DAPT with aspirin/Plavix. I will arrange for lower extremity arterial Doppler studies at the [facility] office in one week and have him seen back by Dr. Gwenlyn Found in 2-3 weeks.  Acute blood loss anemia: he did have some bleeding with femoral sheath pull. His hemoglobin dropped from 12.8-10. Minimal bruising at groin site. He's had no further bleeding. He is asymptomatic.  Sinus tachycardia: He has chronic sinus tachycardia with baseline heart rates from around 100-115. Telemetry reveals that he has been running around 125 bpm today and last night  HLD: on atorvastatin with his most recent lipid profile performed 12/03/14 revealed a total cholesterol 137, LDL 92 HDL of 26  Tobacco abuse: he has nicotine patches at home he can try.   Judy Pimple PA-C  Pager [phone]  The patient was seen, examined and discussed with Douglas Range, PA-C and I agree with the above.   [name] is a 60 y.o. 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 He underwent a successful mid left SFA chronic total occlusion directional atherectomy followed by drug eluting  balloon angioplasty using distal protection.  He will be discharged today on DAPT with aspirin/Plavix. I will arrange for lower extremity arterial Doppler studies at the [facility] office in one week and have him seen back by Dr. Gwenlyn Found in 2-3 weeks. We will also arrange a PA visit in 1 week with CBC recheck (mild drop today post procedure).  [name] 07/20/2015

## 2015-07-20 NOTE — Discharge Summary (Signed)
Discharge Summary    Patient ID: [name],  MRN: [id number], DOB/AGE: [birth year] 60 y.o.  Admit date: 07/19/2015 Discharge date: 07/20/2015  Primary Care Provider: Zigmund Gottron Primary Cardiologist: Dr. Gwenlyn Found    Discharge Diagnoses    Principal Problem:   Left leg claudication [facility]) Active Problems:   Tobacco use disorder   COPD, moderate ([place])   History of CVA (cerebrovascular accident)   Hypercholesteremia   Small cell lung cancer ([place])   Peripheral arterial disease ([place])   GERD (gastroesophageal reflux disease)   Allergies No Known Allergies   History of Present Illness     [name] is a 60 y.o. 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   Hospital Course     Consultants: none  Left leg claudication: Dopplers performed in our office 06/25/15 revealed a normal right ABI with a left ABI 0.78 and what appears to be an occluded mid left SFA. He is s/p PV angiogram on 07/19/15 with successful mid left SFA chronic total occlusion directional atherectomy followed by drug eluting balloon angioplasty using distal protection. Plan to discharge him on DAPT with aspirin/Plavix. I will arrange for lower extremity arterial Doppler studies at the [facility] office in one week and have him seen back by Dr. Gwenlyn Found in 2-3 weeks.  Acute blood loss anemia: he did have some bleeding with femoral sheath pull. His hemoglobin dropped from 12.8--> 10. Minimal bruising at groin site. He's had no further bleeding. He is asymptomatic. Will repeat CBC on 07/26/15 at an office visit with me.   Sinus tachycardia: He has chronic sinus tachycardia with baseline heart rates from around 100-115.   HLD: on atorvastatin with his most recent lipid  profile performed 12/03/14 revealed a total cholesterol 137, LDL 92 HDL of 26  Tobacco abuse: he has nicotine patches at home he can try.    The patient has had an uncomplicated hospital course and is recovering well. The femoral catheter site is stable. He has been seen by Dr. Meda Coffee today and deemed ready for discharge home. All follow-up appointments have been scheduled. Smoking cessation was disscussed in length. Discharge medications are listed below.  _____________  Discharge Vitals Blood pressure 126/74, pulse 110, temperature 97.6 F (36.4 C), temperature source Oral, resp. rate 19, height '5\' 7"'$  (1.702 m), weight 160 lb 15 oz (73 kg), SpO2 97 %.  Filed Weights   07/19/15 0628 07/20/15 0242  Weight: 158 lb (71.668 kg) 160 lb 15 oz (73 kg)    Labs & Radiologic Studies     CBC  Recent Labs  07/20/15 0220  WBC 3.9*  HGB 10.0*  HCT 30.4*  MCV 89.7  PLT 086   Basic Metabolic Panel  Recent Labs  07/20/15 0220  NA 138  K 3.9  CL 106  CO2 24  GLUCOSE 102*  BUN 18  CREATININE 0.96  CALCIUM 8.7*     Ct Chest Wo Contrast  06/23/2015  CLINICAL DATA:  60 year old male with history of right-sided lung cancer diagnosed in 2006 status post chemotherapy and radiation therapy completed in 2006. Shortness breath for the past 2 months. Former smoker (quit in 2010) with 43 pack-year history of smoking. EXAM: CT CHEST WITHOUT CONTRAST TECHNIQUE: Multidetector CT imaging of the chest was performed following the standard protocol without  IV contrast. COMPARISON:  Chest CT 04/12/2015. FINDINGS: Mediastinum/Lymph Nodes: Heart size is normal. There is no significant pericardial fluid, thickening or pericardial calcification. There is atherosclerosis of the thoracic aorta, the great vessels of the mediastinum and the coronary arteries, including calcified atherosclerotic plaque in the left main, left anterior descending, left circumflex and right coronary arteries. No pathologically enlarged  mediastinal or hilar lymph nodes. Esophagus is unremarkable in appearance. Frothy secretions in the right mainstem bronchus. No axillary lymphadenopathy. Lungs/Pleura: The patchy peribronchovascular nodularity noted in the right upper lobe on the prior examination has resolved, presumably infectious or inflammatory at the time of the prior examination. No new suspicious appearing pulmonary nodules or masses are noted on today's examination. No acute consolidative airspace disease. No pleural effusions. Chronic paramediastinal architectural distortion is again noted throughout the right lung, most evident in the right upper lobe, compatible with chronic postradiation mass-like fibrosis. This appears similar to the most recent prior examinations. Mild diffuse bronchial wall thickening with mild centrilobular and paraseptal emphysema. Upper Abdomen: Status post cholecystectomy. Atherosclerosis. 2 mm nonobstructive calculus in the upper pole collecting system of the right kidney. Musculoskeletal/Soft Tissues: There are no aggressive appearing lytic or blastic lesions noted in the visualized portions of the skeleton. IMPRESSION: 1. Resolution of the previously noted infectious or inflammatory changes in the right upper lobe seen on prior study 04/12/2015. No acute findings on today's examination. There are frothy secretions in the right mainstem bronchus, but no other acute findings are noted on today's examination. 2. 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 No definite evidence to suggest local recurrence of disease. 3. Mild diffuse bronchial wall thickening with mild centrilobular and paraseptal emphysema; imaging findings suggestive of underlying COPD. 4. Atherosclerosis, including left main and 3 vessel coronary artery disease. Please note that although the presence of coronary artery calcium documents the presence of  coronary artery disease, the severity of this disease and any potential stenosis cannot be assessed on this non-gated CT examination. Assessment for potential risk factor modification, dietary therapy or pharmacologic therapy may be warranted, if clinically indicated. 5. 2 mm nonobstructive calculus in the upper pole collecting system of the right kidney. 6. Additional incidental findings, as above. Electronically Signed   By: Vinnie Langton M.D.   On: 06/23/2015 11:35     Diagnostic Studies/Procedures    DATE OF PROCEDURE: 07/19/2015  DATE OF DISCHARGE:      PV Angiogram/Intervention    History obtained from chart review.Mr. Vasudevan is a 60 year old widowed Caucasian male father of 2 children who is disabled because of lung cancer. He was referred by Dr. Andria Frames for peripheral vascular evaluation because of left lower extremity claudication. History of factors include treated hyperlipidemia as well as 50-pack-years of tobacco abuse currently smoking one pack per day. He does have lung cancer and has been treated by Dr. Earlie Server. He's had 3 strokes in the past. His brother died of a myocardial infarction at age 20 last year. He had Dopplers in office performed 06/25/15 which revealed a left ABI 0.78 with an occluded left SFA. Does complain of lifestyle limiting left lower extremity claudication. He has had a CT scan of his chest that shows coronary calcification but has never had documented coronary artery disease. He presents today for angiography and potential intervention of his left SFA for Lifestyle limiting claudication.   Pre Procedure Diagnosis: claudication  Post Procedure Diagnosis: claudication  Operators: Dr. Roderic Palau  Berry  Procedures Performed: 1. Abdominal aortogram, bilateral iliac angiogram, bifemoral runoff 2. I obtained contralateral access. 3. Spider distal protection device left above-the-knee popliteal artery 4.  [place]awk 1 directional atherectomy followed by drug eluding balloon angioplasty left SFA  Angiographic Data:   1: Abdominal aortogram-the distal dominant aorta was free of atherosclerotic changes  2: Left lower extremity-there was a short segment occlusion mid left SFA. There was two-vessel runoff with an occluded anterior tibial 3: Right lower extremity-there was 40% segmental mid right SFA with two-vessel runoff. The anterior tibial was occluded  IMPRESSION:Mr. Frisch has a short segment occlusion left SFA with two-vessel runoff. We will proceed with directional atherectomy followed by drug-eluting balloon angioplasty using distal protection  _____________    Disposition   Pt is being discharged home today in good condition.  Follow-up Plans & Appointments    Follow-up Information    Follow up with [facility] On 07/27/2015.   Specialty:  Cardiology   Why:  @ 2:30pm for a repeat ultrasound of your leg.    Contact information:   [address] [place] [phone]      Follow up with Eileen Stanford, PA-C On 07/26/2015.   Specialties:  Cardiology, Radiology   Why:  @ 8am to recheck you blood counts   Contact information:   [place] [address] [phone]       Follow up with [name], MD On 08/06/2015.   Specialties:  Cardiology, Radiology   Why:  @ 9am    Contact information:   [address] [place] [address] [phone]        Discharge Medications   Current Discharge Medication List    START taking these medications   Details  clopidogrel (PLAVIX) 75 MG tablet Take 1 tablet (75 mg total) by mouth daily with breakfast. Qty: 30 tablet, Refills: 11      CONTINUE these medications which have CHANGED   Details  aspirin 81 MG EC tablet Take 1 tablet (81 mg total) by mouth daily.   Associated Diagnoses: Stroke ([place])      CONTINUE these  medications which have NOT CHANGED   Details  atorvastatin (LIPITOR) 40 MG tablet Take 1 tablet (40 mg total) by mouth daily. Qty: 90 tablet, Refills: 3   Associated Diagnoses: Stroke ([place])    fesoterodine (TOVIAZ) 8 MG TB24 tablet Take 1 tablet (8 mg total) by mouth daily. Qty: 30 tablet, Refills: 3   Associated Diagnoses: Detrusor instability    gabapentin (NEURONTIN) 300 MG capsule take 1 capsule by mouth at bedtime Qty: 90 capsule, Refills: 3    omeprazole (PRILOSEC) 20 MG capsule Take 1 capsule (20 mg total) by mouth daily. Qty: 180 capsule, Refills: 3      STOP taking these medications     ibuprofen (ADVIL,MOTRIN) 800 MG tablet          Outstanding Labs/Studies   Left LE arterial doppler in 1 week, CBC on 07/26/15  Duration of Discharge Encounter   Greater than 30 minutes including physician time.  Mable Fill R PA-C 07/20/2015, 10:48 AM

## 2015-07-21 ENCOUNTER — Telehealth: Payer: Self-pay | Admitting: Family Medicine

## 2015-07-21 ENCOUNTER — Other Ambulatory Visit: Payer: Self-pay | Admitting: Cardiovascular Disease

## 2015-07-21 DIAGNOSIS — I739 Peripheral vascular disease, unspecified: Secondary | ICD-10-CM

## 2015-07-21 NOTE — Telephone Encounter (Signed)
Pt called and wanted to thank Dr. Andria Frames for everything he has done for him.He just really appreciates all that he does . jw

## 2015-07-22 ENCOUNTER — Ambulatory Visit: Payer: Medicare Other | Admitting: Family Medicine

## 2015-07-22 NOTE — Telephone Encounter (Signed)
Called and spoke with [facility].  He is recovering well from his recent PVD work.
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
## 2015-07-26 ENCOUNTER — Ambulatory Visit: Payer: Medicare Other | Admitting: Physician Assistant

## 2015-07-26 ENCOUNTER — Encounter: Payer: Self-pay | Admitting: Physician Assistant

## 2015-07-26 ENCOUNTER — Ambulatory Visit (INDEPENDENT_AMBULATORY_CARE_PROVIDER_SITE_OTHER): Payer: Medicare Other | Admitting: Physician Assistant

## 2015-07-26 VITALS — BP 120/80 | HR 106 | Ht 67.0 in | Wt 159.1 lb

## 2015-07-26 DIAGNOSIS — Z720 Tobacco use: Secondary | ICD-10-CM

## 2015-07-26 DIAGNOSIS — D500 Iron deficiency anemia secondary to blood loss (chronic): Secondary | ICD-10-CM | POA: Diagnosis not present

## 2015-07-26 DIAGNOSIS — T148 Other injury of unspecified body region: Secondary | ICD-10-CM

## 2015-07-26 DIAGNOSIS — T148XXA Other injury of unspecified body region, initial encounter: Secondary | ICD-10-CM

## 2015-07-26 DIAGNOSIS — I493 Ventricular premature depolarization: Secondary | ICD-10-CM | POA: Diagnosis not present

## 2015-07-26 DIAGNOSIS — I739 Peripheral vascular disease, unspecified: Secondary | ICD-10-CM

## 2015-07-26 DIAGNOSIS — I639 Cerebral infarction, unspecified: Secondary | ICD-10-CM

## 2015-07-26 LAB — BASIC METABOLIC PANEL
BUN: 15 mg/dL (ref 7–25)
CHLORIDE: 107 mmol/L (ref 98–110)
CO2: 24 mmol/L (ref 20–31)
Calcium: 9.3 mg/dL (ref 8.6–10.3)
Creat: 0.77 mg/dL (ref 0.70–1.25)
GLUCOSE: 120 mg/dL — AB (ref 65–99)
POTASSIUM: 3.9 mmol/L (ref 3.5–5.3)
SODIUM: 139 mmol/L (ref 135–146)

## 2015-07-26 LAB — CBC WITH DIFFERENTIAL/PLATELET
BASOS ABS: 0 10*3/uL (ref 0.0–0.1)
Basophils Relative: 0 % (ref 0–1)
EOS PCT: 2 % (ref 0–5)
Eosinophils Absolute: 0.1 10*3/uL (ref 0.0–0.7)
HEMATOCRIT: 30 % — AB (ref 39.0–52.0)
HEMOGLOBIN: 10.1 g/dL — AB (ref 13.0–17.0)
LYMPHS ABS: 1.3 10*3/uL (ref 0.7–4.0)
LYMPHS PCT: 35 % (ref 12–46)
MCH: 29.7 pg (ref 26.0–34.0)
MCHC: 33.7 g/dL (ref 30.0–36.0)
MCV: 88.2 fL (ref 78.0–100.0)
MONO ABS: 0.3 10*3/uL (ref 0.1–1.0)
MPV: 10.4 fL (ref 8.6–12.4)
Monocytes Relative: 9 % (ref 3–12)
NEUTROS ABS: 1.9 10*3/uL (ref 1.7–7.7)
Neutrophils Relative %: 54 % (ref 43–77)
Platelets: 233 10*3/uL (ref 150–400)
RBC: 3.4 MIL/uL — AB (ref 4.22–5.81)
RDW: 13.3 % (ref 11.5–15.5)
WBC: 3.6 10*3/uL — AB (ref 4.0–10.5)

## 2015-07-26 NOTE — Patient Instructions (Addendum)
Medication Instructions:  Your physician recommends that you continue on your current medications as directed. Please refer to the Current Medication list given to you today.  Labwork: TODAY BMET, CBC W/DIFF  Testing/Procedures: YOU WILL NEED A GROIN ULTRA SOUND TOMORROW 2/28 AT THE [facility] WHEN YOU HAVE YOUR OTHER PV TESTING  Follow-Up: FOLLOW UP WITH DR. Gwenlyn Found AS PLANNED  Any Other Special Instructions Will Be Listed Below (If Applicable).  If you need a refill on your cardiac medications before your next appointment, please call your pharmacy.

## 2015-07-27 ENCOUNTER — Other Ambulatory Visit: Payer: Self-pay | Admitting: Physician Assistant

## 2015-07-27 ENCOUNTER — Telehealth: Payer: Self-pay | Admitting: *Deleted

## 2015-07-27 ENCOUNTER — Inpatient Hospital Stay (HOSPITAL_COMMUNITY): Admission: RE | Admit: 2015-07-27 | Payer: Medicare Other | Source: Ambulatory Visit

## 2015-07-27 ENCOUNTER — Ambulatory Visit (HOSPITAL_COMMUNITY)
Admission: RE | Admit: 2015-07-27 | Discharge: 2015-07-27 | Disposition: A | Discharge status: Home / Self Care | Payer: Medicare Other | Source: Ambulatory Visit | Attending: Physician Assistant | Admitting: Physician Assistant

## 2015-07-27 DIAGNOSIS — R1031 Right lower quadrant pain: Secondary | ICD-10-CM

## 2015-07-27 DIAGNOSIS — T148XXA Other injury of unspecified body region, initial encounter: Secondary | ICD-10-CM

## 2015-07-27 DIAGNOSIS — I10 Essential (primary) hypertension: Secondary | ICD-10-CM | POA: Diagnosis not present

## 2015-07-27 DIAGNOSIS — I739 Peripheral vascular disease, unspecified: Secondary | ICD-10-CM

## 2015-07-27 NOTE — Telephone Encounter (Signed)
Pt has been notified of lab results and findings. Pt advised to f/u with PCP and I will fax results to PCP. Pt is agreeable to plan of care.

## 2015-07-28 ENCOUNTER — Telehealth: Payer: Self-pay | Admitting: Family Medicine

## 2015-07-28 NOTE — Telephone Encounter (Signed)
Called and discussed. Actually, he wanted to talk because his "lady friend" died two weeks ago.  Will follow wbc count. Will see in 2-4 weeks to do supportive counseling.

## 2015-07-28 NOTE — Telephone Encounter (Signed)
Pt called because he went to see his heart doctor and they did blood work and his white blood count is down and was wondering if he should be doing something. Please call to discuss. j w

## 2015-07-29 LAB — HCV VIRAL RNA GEN3 NS5A DRUG RESIST: HCV NS5A SUBTYPE: NOT DETECTED

## 2015-08-02 ENCOUNTER — Other Ambulatory Visit: Payer: Self-pay | Admitting: Pharmacist Clinician (PhC)/ Clinical Pharmacy Specialist

## 2015-08-02 MED ORDER — ELBASVIR-GRAZOPREVIR 50-100 MG PO TABS: 1.0000 | ORAL_TABLET | Freq: Every day | ORAL | Status: DC

## 2015-08-03 ENCOUNTER — Telehealth: Payer: Self-pay | Admitting: Pharmacy Technician

## 2015-08-03 ENCOUNTER — Other Ambulatory Visit: Payer: Self-pay | Admitting: Pharmacist Clinician (PhC)/ Clinical Pharmacy Specialist

## 2015-08-03 DIAGNOSIS — B182 Chronic viral hepatitis C: Secondary | ICD-10-CM

## 2015-08-03 NOTE — Telephone Encounter (Signed)
NS5A needs to be redrawn.  The last one came back inconclusive.

## 2015-08-03 NOTE — Telephone Encounter (Signed)
Need for NS5A labs to be redrawn.  Past one came back inconclusive.

## 2015-08-04 ENCOUNTER — Encounter: Payer: Self-pay | Admitting: Infectious Diseases

## 2015-08-04 ENCOUNTER — Other Ambulatory Visit: Payer: Medicare Other

## 2015-08-04 ENCOUNTER — Ambulatory Visit (INDEPENDENT_AMBULATORY_CARE_PROVIDER_SITE_OTHER): Payer: Medicare Other | Admitting: Infectious Diseases

## 2015-08-04 VITALS — BP 147/93 | HR 96 | Temp 97.3°F | Ht 67.0 in | Wt 159.0 lb

## 2015-08-04 DIAGNOSIS — B182 Chronic viral hepatitis C: Secondary | ICD-10-CM | POA: Diagnosis not present

## 2015-08-04 DIAGNOSIS — B351 Tinea unguium: Secondary | ICD-10-CM | POA: Diagnosis present

## 2015-08-04 DIAGNOSIS — I739 Peripheral vascular disease, unspecified: Secondary | ICD-10-CM

## 2015-08-04 DIAGNOSIS — Z23 Encounter for immunization: Secondary | ICD-10-CM | POA: Diagnosis not present

## 2015-08-04 DIAGNOSIS — I639 Cerebral infarction, unspecified: Secondary | ICD-10-CM | POA: Diagnosis not present

## 2015-08-04 DIAGNOSIS — J449 Chronic obstructive pulmonary disease, unspecified: Secondary | ICD-10-CM | POA: Diagnosis not present

## 2015-08-04 DIAGNOSIS — Z720 Tobacco use: Secondary | ICD-10-CM | POA: Diagnosis not present

## 2015-08-04 NOTE — Assessment & Plan Note (Signed)
Appears to be doing well.  Wound well healed, ? Some scarring  He has f/u with MD on 3-31

## 2015-08-04 NOTE — Progress Notes (Signed)
   Subjective:    Patient ID: [name], male    DOB: 04-01-1956, 60 y.o.   MRN: [id number]  HPI 60 yo M with hx of PVD,  seen in ID early January for concern about MAI. He had 2 sputum sent for AFB- one grew M gordonae (a contaminant typically) and the second was negative.   He had f/u CT scan (1-25) 1. Resolution of the previously noted infectious or inflammatory changes in the right upper lobe seen on prior study 04/12/2015. No acute findings on today's examination. There are frothy secretions in the right mainstem bronchus, but no other acute findings are noted on today's examination. 2. 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 No definite evidence to suggest local recurrence of disease. 3. Mild diffuse bronchial wall thickening with mild centrilobular and paraseptal emphysema; imaging findings suggestive of underlying COPD. 4. Atherosclerosis, including left main and 3 vessel coronary artery disease. Please note that although the presence of coronary artery calcium documents the presence of coronary artery disease, the severity of this disease and any potential stenosis cannot be assessed on this non-gated CT examination. Assessment for potential risk factor modification, dietary therapy or pharmacologic therapy may be warranted, if clinically indicated. 5. 2 mm nonobstructive calculus in the upper pole collecting system of the right kidney. 6. Additional incidental findings, as above.  His Gordonae was felt to be a [employer] copy. He was also noted to have Hep C. His labs have shown normal LFTs, normal INR. His Fibrosis score was F3.  He is genotype 1a and VL 3.5 million. He is Hep B negative, Hep A "borderline".   He returned to hospital on 2-20 and had successful mid left SFA chronic total occlusion directional atherectomy followed by drug eluting balloon  angioplasty using distal protection.    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 Used "you name it, I've done" other drugs. He has been clean from these as well. Goes to [facility].    Review of Systems     Objective:   Physical Exam  Constitutional: He appears well-developed and well-nourished.  Cardiovascular:  Pulses:      Posterior tibial pulses are 2+ on the left side.  Genitourinary:         Assessment & Plan:

## 2015-08-04 NOTE — Assessment & Plan Note (Signed)
He is doing well Await his NS5A testing to see which anti-viral he can be started on.  Zepatier will not interact with his statin and PPI as much as other choices.  He has met with pharm.  Once we have results, will coordinate with pharm and get him started on rx.  rtc 6 weeks post for repeat VL

## 2015-08-06 ENCOUNTER — Ambulatory Visit (INDEPENDENT_AMBULATORY_CARE_PROVIDER_SITE_OTHER): Payer: Medicare Other | Admitting: Cardiovascular Disease

## 2015-08-06 ENCOUNTER — Encounter: Payer: Self-pay | Admitting: Cardiovascular Disease

## 2015-08-06 ENCOUNTER — Ambulatory Visit: Payer: Medicare Other | Admitting: Cardiovascular Disease

## 2015-08-06 VITALS — BP 152/86 | HR 96 | Ht 67.0 in | Wt 161.4 lb

## 2015-08-06 DIAGNOSIS — I739 Peripheral vascular disease, unspecified: Secondary | ICD-10-CM | POA: Diagnosis not present

## 2015-08-06 DIAGNOSIS — I639 Cerebral infarction, unspecified: Secondary | ICD-10-CM | POA: Diagnosis not present

## 2015-08-06 DIAGNOSIS — R1031 Right lower quadrant pain: Secondary | ICD-10-CM | POA: Diagnosis not present

## 2015-08-06 DIAGNOSIS — E7800 Pure hypercholesterolemia, unspecified: Secondary | ICD-10-CM

## 2015-08-06 DIAGNOSIS — I10 Essential (primary) hypertension: Secondary | ICD-10-CM

## 2015-08-06 DIAGNOSIS — F17200 Nicotine dependence, unspecified, uncomplicated: Secondary | ICD-10-CM

## 2015-08-06 NOTE — Assessment & Plan Note (Signed)
History of peripheral arterial disease with symptomatic claudication on the left. Angiogram to him 07/19/15 revealing a short second segment chronic total occlusion mid left SFA with two-vessel runoff. I performed directional atherectomy and drug eluding balloon angioplasty with excellent angiographic result. His left ABI increased from 0.70 2.99 and his claudication has improved as well. He does have a small "knot" in his right groin with a soft bruit. I'm going to get Dopplers of his groin to rule out a pseudoaneurysm.

## 2015-08-06 NOTE — Assessment & Plan Note (Signed)
History of hyperlipidemia on statin therapy with recent lipid profile performed 12/03/14 revealing total cholesterol 137, LDL 92 HDL 26

## 2015-08-06 NOTE — Patient Instructions (Signed)
Medication Instructions:  Your physician recommends that you continue on your current medications as directed. Please refer to the Current Medication list given to you today.   Labwork: none  Testing/Procedures: Your physician has requested that you have a lower extremity arterial doppler- During this test, ultrasound is used to evaluate arterial blood flow in the legs. Allow approximately one hour for this exam. SCHEDULED IN 6 MONTHS   Your physician has requested that you have a ultra sound of your RIGHT groin to evaluate for a pseudoaneurysm. This is a non-invasive testing that is done in the office   Follow-Up: Your physician wants you to follow-up in: [place]. You will receive a reminder letter in the mail two months in advance. If you don't receive a letter, please call our office to schedule the follow-up appointment.   Any Other Special Instructions Will Be Listed Below (If Applicable).     If you need a refill on your cardiac medications before your next appointment, please call your pharmacy.

## 2015-08-06 NOTE — Progress Notes (Signed)
08/06/2015 [name]   Jan 18, 1956  [id number]  Primary Physician Zigmund Gottron, MD Primary Cardiologist: Lorretta Harp MD Renae Gloss   HPI:  Mr. Frett is a 60 year old widowed Caucasian male father of 2 children who is disabled because of lung cancer. He was referred by Dr. Andria Frames for peripheral vascular evaluation because of left lower extremity claudication. I last saw him in the office 07/14/15.History of factors include treated hyperlipidemia as well as 50-pack-years of tobacco abuse currently smoking one pack per day. He does have lung cancer and has been treated by Dr. Earlie Server. He's had 3 strokes in the past. His brother died of a myocardial infarction at age 75 last year. He had Dopplers in office performed 06/25/15 which revealed a left ABI 0.78 with an occluded left SFA. Does complain of lifestyle limiting left lower extremity claudication. He has had a CT scan of his chest that shows coronary calcification but has never had documented coronary artery disease.  I performed angiography on him 07/19/15 revealing a short segment chronic total occlusion mid left SFA with two-vessel runoff. I performed [facility] directional atherectomy followed by drug-eluting balloon angioplasty with excellent angiographic and clinical result. His left ABI improved from 0.70 2.99 and his claudication improved as well.   Current Outpatient Prescriptions  Medication Sig Dispense Refill  . aspirin EC 81 MG tablet Take 81 mg by mouth daily.    Marland Kitchen atorvastatin (LIPITOR) 40 MG tablet Take 1 tablet (40 mg total) by mouth daily. 90 tablet 3  . clopidogrel (PLAVIX) 75 MG tablet Take 1 tablet (75 mg total) by mouth daily with breakfast. 30 tablet 11  . fesoterodine (TOVIAZ) 8 MG TB24 tablet Take 1 tablet (8 mg total) by mouth daily. 30 tablet 3  . gabapentin (NEURONTIN) 300 MG capsule take 1 capsule by mouth at bedtime 90 capsule 3  . ibuprofen (ADVIL,MOTRIN) 800 MG tablet Take 800 mg  by mouth every 6 (six) hours as needed (FOR PAIN).   0  . omeprazole (PRILOSEC) 20 MG capsule Take 1 capsule (20 mg total) by mouth daily. 180 capsule 3   No current facility-administered medications for this visit.    No Known Allergies  Social History   Social History  . Marital Status: Divorced    Spouse Name: N/A  . Number of Children: N/A  . Years of Education: N/A   Occupational History  . Not on file.   Social History Main Topics  . Smoking status: Current Every Day Smoker -- 1.00 packs/day for 45 years    Types: Cigarettes    Start date: 05/29/1968  . Smokeless tobacco: Never Used  . Alcohol Use: No     Comment: past per patient none since 2005  . 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 Sexual Activity:    Partners: Female   Other Topics Concern  . Not on file   Social History Narrative   Diabled   Single   2 sons     Review of Systems: General: negative for chills, fever, night sweats or weight changes.  Cardiovascular: negative for chest pain, dyspnea on exertion, edema, orthopnea, palpitations, paroxysmal nocturnal dyspnea or shortness of breath Dermatological: negative for rash Respiratory: negative for cough or wheezing Urologic: negative for hematuria Abdominal: negative for nausea, vomiting, diarrhea, bright red blood per rectum, melena, or hematemesis Neurologic: negative for visual changes, syncope, or dizziness All other systems reviewed and are otherwise negative except as noted  above.    Blood pressure 152/86, pulse 96, height '5\' 7"'$  (1.702 m), weight 161 lb 6 oz (73.199 kg).  General appearance: alert and no distress Neck: no adenopathy, no carotid bruit, no JVD, supple, symmetrical, trachea midline and thyroid not enlarged, symmetric, no tenderness/mass/nodules Lungs: clear to auscultation bilaterally Heart: regular rate and rhythm, S1, S2 normal, no murmur, click, rub or gallop Extremities: extremities normal, atraumatic, no cyanosis or  edema and small knot right groin with bruit, 2+ left pedal pulse  EKG not performed today  ASSESSMENT AND PLAN:   Tobacco use disorder History tobacco abuse continuing to smoke approximately three quarters of a pack per day recalcitrant risk factor modification.  Hypercholesteremia History of hyperlipidemia on statin therapy with recent lipid profile performed 12/03/14 revealing total cholesterol 137, LDL 92 HDL 26  Hypertension History of hypertension blood pressure measured at 152/86. He is not on antihypertensive medications.  Peripheral arterial disease ([place]) History of peripheral arterial disease with symptomatic claudication on the left. Angiogram to him 07/19/15 revealing a short second segment chronic total occlusion mid left SFA with two-vessel runoff. I performed directional atherectomy and drug eluding balloon angioplasty with excellent angiographic result. His left ABI increased from 0.70 2.99 and his claudication has improved as well. He does have a small "knot" in his right groin with a soft bruit. I'm going to get Dopplers of his groin to rule out a pseudoaneurysm.      Lorretta Harp MD FACP,FACC,FAHA, [facility] 08/06/2015 10:47 AM

## 2015-08-06 NOTE — Assessment & Plan Note (Signed)
History of hypertension blood pressure measured at 152/86. He is not on antihypertensive medications.

## 2015-08-06 NOTE — Assessment & Plan Note (Addendum)
History tobacco abuse continuing to smoke approximately three quarters of a pack per day recalcitrant risk factor modification.

## 2015-08-09 ENCOUNTER — Encounter (HOSPITAL_COMMUNITY): Payer: Medicare Other

## 2015-08-09 ENCOUNTER — Ambulatory Visit (HOSPITAL_COMMUNITY)
Admission: RE | Admit: 2015-08-09 | Discharge: 2015-08-09 | Disposition: A | Discharge status: Home / Self Care | Payer: Medicare Other | Source: Ambulatory Visit | Attending: Cardiovascular Disease | Admitting: Cardiovascular Disease

## 2015-08-09 DIAGNOSIS — R1031 Right lower quadrant pain: Secondary | ICD-10-CM

## 2015-08-09 LAB — HCV RNA NS5A DRUG RESISTANCE

## 2015-08-10 ENCOUNTER — Telehealth: Payer: Self-pay | Admitting: Family Medicine

## 2015-08-10 DIAGNOSIS — N3281 Overactive bladder: Secondary | ICD-10-CM

## 2015-08-10 MED ORDER — FESOTERODINE FUMARATE ER 8 MG PO TB24: 8.0000 mg | ORAL_TABLET | Freq: Every day | ORAL | Status: DC

## 2015-08-10 NOTE — Telephone Encounter (Signed)
Pt called because he needs a refill on his Toviaz. He is completely out. Blima Rich

## 2015-08-11 ENCOUNTER — Ambulatory Visit: Payer: Medicare Other | Admitting: Pharmacist Clinician (PhC)/ Clinical Pharmacy Specialist

## 2015-08-11 DIAGNOSIS — B182 Chronic viral hepatitis C: Secondary | ICD-10-CM

## 2015-08-11 MED ORDER — ELBASVIR-GRAZOPREVIR 50-100 MG PO TABS: 1.0000 | ORAL_TABLET | Freq: Every day | ORAL | Status: DC

## 2015-08-11 MED ORDER — ATORVASTATIN CALCIUM 20 MG PO TABS
20.0000 mg | ORAL_TABLET | Freq: Every day | ORAL | Status: DC
Start: 2015-08-11 — End: 2016-01-17

## 2015-08-11 NOTE — Progress Notes (Signed)
HPI: [name] is a 60 y.o. male who is here to address some of the medication issues before we are proceeding with his hep C treatment.   Lab Results  Component Value Date   HCVGENOTYPE 1a 07/14/2015    Allergies: No Known Allergies  Vitals:    Past Medical History: Past Medical History  Diagnosis Date  . H/O: lung cancer right side  . Hypertension 12/28/2010  . Small cell lung cancer ([place]) dx;d 2006    a. s/p chemo/xrt comp to 2006  . CVA (cerebral vascular accident) ([place])   . CAD (coronary artery disease) 06/30/2015  . Peripheral arterial disease ([facility])     a. s/p PV angiogram on 07/19/15 with successful mid left SFA chronic total occlusion directional atherectomy followed by drug eluting balloon angioplasty using distal protection.  Marland Kitchen GERD (gastroesophageal reflux disease)   . Headache   . Hepatitis C infection     Social History: Social History   Social History  . Marital Status: Divorced    Spouse Name: N/A  . Number of Children: N/A  . Years of Education: N/A   Social History Main Topics  . Smoking status: Current Every Day Smoker -- 1.00 packs/day for 45 years    Types: Cigarettes    Start date: 05/29/1968  . Smokeless tobacco: Never Used  . Alcohol Use: No     Comment: past per patient none since 2005  . 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 Sexual Activity:    Partners: Female   Other Topics Concern  . Not on file   Social History Narrative   Diabled   Single   2 sons    Labs: HEP B S AB (no units)  Date Value  07/14/2015 NEG   HEPATITIS B SURFACE AG (no units)  Date Value  07/14/2015 NEGATIVE   HCV AB (no units)  Date Value  06/16/2015 REACTIVE*    Lab Results  Component Value Date   HCVGENOTYPE 1a 07/14/2015    Hepatitis C RNA quantitative Latest Ref Rng 06/16/2015  HCV Quantitative <15 IU/mL [id number](Q)  HCV Quantitative Log <1.18 log 10 6.55(H)    AST (U/L)  Date Value  07/14/2015 29  06/23/2015 35*  12/02/2014 51*   06/22/2014 46*  06/20/2013 30  10/23/2012 26  06/15/2011 35   ALT (U/L)  Date Value  07/14/2015 30  07/14/2015 33  06/23/2015 38  12/02/2014 48  06/22/2014 56*  06/20/2013 49  10/23/2012 22   ALT(SGPT) (U/L)  Date Value  06/15/2011 43   INR (no units)  Date Value  07/19/2015 1.00  12/02/2014 0.98  01/05/2010 0.87    CrCl: Estimated Creatinine Clearance: 91.8 mL/min (by C-G formula based on Cr of 0.77).  Fibrosis Score: F3 as assessed by Fibrosure  Child-Pugh Score: Class A  Previous Treatment Regimen: Naive  Assessment: [name] is here so we can go over the plan to change his meds slightly so we can start his Hep C treatment. His NS5A came back neg. We are planning on using Zepatier since it has less interaction issue with both of his atorvastatin and Prilosec. [name] does have some issue with comprehending a lot of things, therefore, we are trying to make it as simple as possible for him. Zepatier has less interaction with the atorvastatin but the dose is usually limit to '20mg'$ /day but he is currently on '40mg'$  right now. We are going to reduce the dose to '20mg'$  for 3 months while  he is on therapy. Made him a calendar to specify all of the changes today and advised him to use it everyday. I'm going to message Dr. Andria Frames about the slight change to the atorvastatin.   Recommendations:  Start PA for Zepatier 1 PO qday x 3 months Reduce Lipitor to '20mg'$  PO qday Med calendar today F/u with VL after approval and meds initiation  Onnie Boer [place], [place].D., BCPS, AAHIVP Clinical [facility] 08/11/2015, 11:15 AM

## 2015-08-12 ENCOUNTER — Telehealth: Payer: Self-pay | Admitting: Family Medicine

## 2015-08-12 MED FILL — ATORVASTATIN 20 MG TABLET: 20 | 30 days supply | Qty: 30 | Fill #0

## 2015-08-12 MED FILL — *ZEPATIER 50-100 MG TABLET: 50-100 | 28 days supply | Qty: 28 | Fill #0

## 2015-08-12 NOTE — Telephone Encounter (Signed)
Called and said decrease dose is correct when he starts the Hep C med (has not started yet.)  He understands and we will discuss more at his visit next week.

## 2015-08-12 NOTE — Telephone Encounter (Signed)
Mr. Ptacek called to speak with provider regarding the change in dosage for his Lipitor.  The doctor he see at the ID clinic want him to cut back to taking '20mg'$  of the Lipitor instead of the 40.  He want to speak with you about this first.

## 2015-08-13 ENCOUNTER — Encounter: Payer: Self-pay | Admitting: Pharmacy Technician

## 2015-08-19 ENCOUNTER — Encounter: Payer: Self-pay | Admitting: Family Medicine

## 2015-08-19 ENCOUNTER — Ambulatory Visit (INDEPENDENT_AMBULATORY_CARE_PROVIDER_SITE_OTHER): Payer: Medicare Other | Admitting: Family Medicine

## 2015-08-19 VITALS — BP 171/89 | HR 89 | Temp 97.5°F | Ht 67.0 in | Wt 158.9 lb

## 2015-08-19 DIAGNOSIS — F17200 Nicotine dependence, unspecified, uncomplicated: Secondary | ICD-10-CM | POA: Diagnosis not present

## 2015-08-19 DIAGNOSIS — Z634 Disappearance and death of family member: Principal | ICD-10-CM

## 2015-08-19 DIAGNOSIS — B182 Chronic viral hepatitis C: Secondary | ICD-10-CM

## 2015-08-19 DIAGNOSIS — J449 Chronic obstructive pulmonary disease, unspecified: Secondary | ICD-10-CM | POA: Diagnosis not present

## 2015-08-19 DIAGNOSIS — I739 Peripheral vascular disease, unspecified: Secondary | ICD-10-CM

## 2015-08-19 DIAGNOSIS — Z720 Tobacco use: Secondary | ICD-10-CM | POA: Diagnosis not present

## 2015-08-19 DIAGNOSIS — F4329 Adjustment disorder with other symptoms: Secondary | ICD-10-CM

## 2015-08-19 DIAGNOSIS — F321 Major depressive disorder, single episode, moderate: Secondary | ICD-10-CM

## 2015-08-19 DIAGNOSIS — F4321 Adjustment disorder with depressed mood: Secondary | ICD-10-CM | POA: Diagnosis not present

## 2015-08-19 DIAGNOSIS — I639 Cerebral infarction, unspecified: Secondary | ICD-10-CM | POA: Diagnosis not present

## 2015-08-19 DIAGNOSIS — C3490 Malignant neoplasm of unspecified part of unspecified bronchus or lung: Secondary | ICD-10-CM

## 2015-08-19 DIAGNOSIS — Z23 Encounter for immunization: Secondary | ICD-10-CM | POA: Diagnosis not present

## 2015-08-19 DIAGNOSIS — B351 Tinea unguium: Secondary | ICD-10-CM | POA: Diagnosis present

## 2015-08-19 MED ORDER — BUPROPION HCL 75 MG PO TABS: 75.0000 mg | ORAL_TABLET | Freq: Three times a day (TID) | ORAL | Status: DC

## 2015-08-19 NOTE — Patient Instructions (Signed)
Take the new medicine which should help both depression and make it easier to quit smoking, twice a day for the first week and three times per day afterward. Set a quit date for 3-7 days after you have been taking the medication three times a day.

## 2015-08-19 NOTE — Assessment & Plan Note (Addendum)
Wife died.  Disabled son did not show to group home.  Worried, likely living on street. Bupropion Rx

## 2015-08-20 NOTE — Progress Notes (Signed)
   Subjective:    Patient ID: [name], male    DOB: 1956-02-10, 59 y.o.   MRN: [id number]  HPI   Multiple issues 1. Is seeing ID and has begun Rx for Hep C chronic.  No issues to date.  Plans to follow through. 2. He has been released by oncology for his lung cancer.  No evidence of recurrence. But... 3. Smoking again.  Had quit.  Death of 2 brothers, family discord and death of ex wife has him severely depressed.  He has always been able to shake depression, but is really severe this time. 3. Depression as above.   4. No complaints of cough or wheezing.  ID is not treating incidental CT finding of possible mycobacterium infection given negative cultures. 5. No complaints related to recent vascular surgery.  Able to walk much further without claudication.   Review of Systems     Objective:   Physical Exam Flat affect Knows he needs to quit smoking. Lungs bibasilar crackles.          Assessment & Plan:

## 2015-08-20 NOTE — Assessment & Plan Note (Signed)
Stable - not med requiring.

## 2015-08-20 NOTE — Assessment & Plan Note (Signed)
Doing well since recent endarterectomy.

## 2015-08-20 NOTE — Assessment & Plan Note (Signed)
Under treatment.

## 2015-08-20 NOTE — Assessment & Plan Note (Signed)
Has been released by onc.  Very concerned about his smoking relapse.

## 2015-08-20 NOTE — Assessment & Plan Note (Signed)
Counseled on cessation.  Wellbutrin for depression and smoking cessation.

## 2015-09-02 ENCOUNTER — Ambulatory Visit (INDEPENDENT_AMBULATORY_CARE_PROVIDER_SITE_OTHER): Payer: Medicare Other | Admitting: Pharmacist Clinician (PhC)/ Clinical Pharmacy Specialist

## 2015-09-02 DIAGNOSIS — B182 Chronic viral hepatitis C: Secondary | ICD-10-CM

## 2015-09-02 NOTE — Patient Instructions (Signed)
Continue to take Zepatier 1 tablet daily Come back for labs in 2 weeks F/u with Dr. Johnnye Sima in May

## 2015-09-02 NOTE — Progress Notes (Signed)
Patient ID: [name], male   DOB: 1955-06-13, 60 y.o.   MRN: [id number] HPI: [name] is a 60 y.o. male who is here for his pharmacy visit for hep C.  Lab Results  Component Value Date   HCVGENOTYPE 1a 07/14/2015    Allergies: No Known Allergies  Vitals:    Past Medical History: Past Medical History  Diagnosis Date  . H/O: lung cancer right side  . Hypertension 12/28/2010  . Small cell lung cancer ([place]) dx;d 2006    a. s/p chemo/xrt comp to 2006  . CVA (cerebral vascular accident) ([place])   . CAD (coronary artery disease) 06/30/2015  . Peripheral arterial disease ([facility])     a. s/p PV angiogram on 07/19/15 with successful mid left SFA chronic total occlusion directional atherectomy followed by drug eluting balloon angioplasty using distal protection.  Marland Kitchen GERD (gastroesophageal reflux disease)   . Headache   . Hepatitis C infection     Social History: Social History   Social History  . Marital Status: Divorced    Spouse Name: N/A  . Number of Children: N/A  . Years of Education: N/A   Social History Main Topics  . Smoking status: Current Every Day Smoker -- 1.00 packs/day for 45 years    Types: Cigarettes    Start date: 05/29/1968  . Smokeless tobacco: Never Used  . Alcohol Use: No     Comment: past per patient none since 2005  . 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 Sexual Activity:    Partners: Female   Other Topics Concern  . Not on file   Social History Narrative   Diabled   Single   2 sons    Labs: HEP B S AB (no units)  Date Value  07/14/2015 NEG   HEPATITIS B SURFACE AG (no units)  Date Value  07/14/2015 NEGATIVE   HCV AB (no units)  Date Value  06/16/2015 REACTIVE*    Lab Results  Component Value Date   HCVGENOTYPE 1a 07/14/2015    Hepatitis C RNA quantitative Latest Ref Rng 06/16/2015  HCV Quantitative <15 IU/mL 3512999(H)  HCV Quantitative Log <1.18 log 10 6.55(H)    AST (U/L)  Date Value  07/14/2015 29  06/23/2015  35*  12/02/2014 51*  06/22/2014 46*  06/20/2013 30  10/23/2012 26  06/15/2011 35   ALT (U/L)  Date Value  07/14/2015 30  07/14/2015 33  06/23/2015 38  12/02/2014 48  06/22/2014 56*  06/20/2013 49  10/23/2012 22   ALT(SGPT) (U/L)  Date Value  06/15/2011 43   INR (no units)  Date Value  07/19/2015 1.00  12/02/2014 0.98  01/05/2010 0.87    CrCl: CrCl cannot be calculated (Patient has no serum creatinine result on file.).  Fibrosis Score: F3 as assessed by Fibrosure  Child-Pugh Score: Class A  Previous Treatment Regimen: Naive  Assessment:  [name] started his Zepatier on 3/20 so he is about 2 wks in. He has geno 1a with NS5A neg. He has not missed any doses. The only side effect that he has is some constipation which is likely not due to the med. Told him to pick up some miralax at [facility] to help with constipation. Otherwise, he is doing well on the med. Interaction issue with atorvastatin has been addressed. He will come back in 2 wks for Hep C VL and f/u with Dr. Johnnye Sima in May.  Recommendations:  Cont Zepatier 1 PO qday x 3 mo F/u  with Hep C VL in 2 wks F/u with Dr. Johnnye Sima in May  Gutierrez, Douglas [place], [place].D., BCPS, AAHIVP Clinical Infectious [facility] 09/02/2015, 10:33 AM

## 2015-09-06 MED FILL — *ZEPATIER 50-100 MG TABLET: 50-100 | 28 days supply | Qty: 28 | Fill #1

## 2015-09-06 MED FILL — ATORVASTATIN 20 MG TABLET: 20 | 30 days supply | Qty: 30 | Fill #1

## 2015-09-16 ENCOUNTER — Other Ambulatory Visit: Payer: Medicare Other

## 2015-09-16 DIAGNOSIS — B182 Chronic viral hepatitis C: Secondary | ICD-10-CM | POA: Diagnosis not present

## 2015-09-20 LAB — HEPATITIS C RNA QUANTITATIVE: HCV Quantitative: NOT DETECTED IU/mL (ref <15)

## 2015-09-29 MED FILL — *ZEPATIER 50-100 MG TABLET: 50-100 | 28 days supply | Qty: 28 | Fill #2

## 2015-10-18 MED FILL — ATORVASTATIN 20 MG TABLET: 20 | 30 days supply | Qty: 30 | Fill #2

## 2015-10-20 ENCOUNTER — Ambulatory Visit (INDEPENDENT_AMBULATORY_CARE_PROVIDER_SITE_OTHER): Payer: Medicare Other | Admitting: Infectious Diseases

## 2015-10-20 ENCOUNTER — Encounter: Payer: Self-pay | Admitting: Infectious Diseases

## 2015-10-20 VITALS — BP 161/81 | HR 99 | Temp 97.6°F | Wt 161.0 lb

## 2015-10-20 DIAGNOSIS — Z23 Encounter for immunization: Secondary | ICD-10-CM | POA: Diagnosis not present

## 2015-10-20 DIAGNOSIS — B351 Tinea unguium: Secondary | ICD-10-CM | POA: Diagnosis present

## 2015-10-20 DIAGNOSIS — J449 Chronic obstructive pulmonary disease, unspecified: Secondary | ICD-10-CM | POA: Diagnosis not present

## 2015-10-20 DIAGNOSIS — Z720 Tobacco use: Secondary | ICD-10-CM | POA: Diagnosis not present

## 2015-10-20 DIAGNOSIS — F17200 Nicotine dependence, unspecified, uncomplicated: Secondary | ICD-10-CM

## 2015-10-20 DIAGNOSIS — B182 Chronic viral hepatitis C: Secondary | ICD-10-CM

## 2015-10-20 DIAGNOSIS — I639 Cerebral infarction, unspecified: Secondary | ICD-10-CM | POA: Diagnosis not present

## 2015-10-20 LAB — CBC
HEMATOCRIT: 32.5 % — AB (ref 38.5–50.0)
HEMOGLOBIN: 10.3 g/dL — AB (ref 13.2–17.1)
MCH: 27.3 pg (ref 27.0–33.0)
MCHC: 31.7 g/dL — AB (ref 32.0–36.0)
MCV: 86.2 fL (ref 80.0–100.0)
MPV: 9.4 fL (ref 7.5–12.5)
Platelets: 266 10*3/uL (ref 140–400)
RBC: 3.77 MIL/uL — ABNORMAL LOW (ref 4.20–5.80)
RDW: 15.4 % — ABNORMAL HIGH (ref 11.0–15.0)
WBC: 4.3 10*3/uL (ref 3.8–10.8)

## 2015-10-20 NOTE — Assessment & Plan Note (Signed)
Encouraged to quit smoking.  

## 2015-10-20 NOTE — Assessment & Plan Note (Signed)
He appears to be doing well on his therapy.  He will get repeat VL today.  He can f/u with [place] in 6 weeks or Dr Andria Frames for a repeat/test of cure Hep C VL.

## 2015-10-20 NOTE — Progress Notes (Signed)
   Subjective:    Patient ID: [name], male    DOB: [birth year], 60 y.o.   MRN: [id number]  HPI  60 yo M with hx of PVD, seen in ID early January for concern about MAI. He had 2 sputum sent for AFB- one grew M gordonae, one negative. His Gordonae was felt to be a Publishing copy. He was also noted to have Hep C. His labs have shown normal LFTs, normal INR. His Fibrosis score was F3.  He is genotype 1a and VL 3.5 million.  He was started on zepatier March 2017 after his lipitor dose was decreased. He had repeat Hep C VL negative on 09-16-15.  Today states he just started his 3rd package of hep C rx. No problems with medications.  No missed medications.   Review of Systems  Constitutional: Negative for appetite change and unexpected weight change.  Respiratory: Positive for cough and shortness of breath.   Gastrointestinal: Negative for diarrhea and constipation.  Genitourinary: Negative for difficulty urinating.  Musculoskeletal: Negative for myalgias and arthralgias.  no change in stool color, no icterus     Objective:   Physical Exam  Constitutional: He appears well-developed and well-nourished.  HENT:  Mouth/Throat: No oropharyngeal exudate.  Eyes: EOM are normal. Pupils are equal, round, and reactive to light.  Neck: Neck supple.  Cardiovascular: Normal rate, regular rhythm and normal heart sounds.   Pulmonary/Chest: Effort normal and breath sounds normal.  Abdominal: Soft. Bowel sounds are normal. There is no tenderness. There is no rebound.  Musculoskeletal: He exhibits no edema.  Lymphadenopathy:    He has no cervical adenopathy.          Assessment & Plan:

## 2015-10-20 NOTE — Assessment & Plan Note (Signed)
Assuming this is cause of his cough, sob. (not his M gordonae).  encourged to quit smoking.

## 2015-10-21 LAB — COMPREHENSIVE METABOLIC PANEL
ALT: 15 U/L (ref 9–46)
AST: 13 U/L (ref 10–35)
Albumin: 3.6 g/dL (ref 3.6–5.1)
Alkaline Phosphatase: 83 U/L (ref 40–115)
BILIRUBIN TOTAL: 0.4 mg/dL (ref 0.2–1.2)
BUN: 14 mg/dL (ref 7–25)
CALCIUM: 8.9 mg/dL (ref 8.6–10.3)
CO2: 24 mmol/L (ref 20–31)
Chloride: 107 mmol/L (ref 98–110)
Creat: 0.94 mg/dL (ref 0.70–1.25)
GLUCOSE: 101 mg/dL — AB (ref 65–99)
Potassium: 4.3 mmol/L (ref 3.5–5.3)
SODIUM: 140 mmol/L (ref 135–146)
Total Protein: 6.8 g/dL (ref 6.1–8.1)

## 2015-10-21 LAB — HEPATITIS C RNA QUANTITATIVE: HCV Quantitative: NOT DETECTED IU/mL (ref <15)

## 2015-11-03 ENCOUNTER — Encounter (HOSPITAL_COMMUNITY): Payer: Self-pay | Admitting: *Deleted

## 2015-11-03 ENCOUNTER — Ambulatory Visit (HOSPITAL_COMMUNITY)
Admission: EM | Admit: 2015-11-03 | Discharge: 2015-11-03 | Disposition: A | Discharge status: Home / Self Care | Payer: Medicare Other | Attending: Emergency Medicine | Admitting: Emergency Medicine

## 2015-11-03 DIAGNOSIS — L551 Sunburn of second degree: Secondary | ICD-10-CM | POA: Diagnosis not present

## 2015-11-03 MED ORDER — HYDROCODONE-ACETAMINOPHEN 5-325 MG PO TABS: 1.0000 | ORAL_TABLET | ORAL | Status: DC | PRN

## 2015-11-03 MED ORDER — SILVER SULFADIAZINE 1 % EX CREA: 1.0000 "application " | TOPICAL_CREAM | Freq: Every day | CUTANEOUS | Status: DC

## 2015-11-03 NOTE — Discharge Instructions (Signed)
Sunburn Sunburn is damage to the skin caused by overexposure to ultraviolet (UV) rays. People with light skin or a fair complexion may be more susceptible to sunburn. Repeated sun exposure causes early skin aging such as wrinkles and sun spots. It also increases the risk of skin cancer. CAUSES A sunburn is caused by getting too much UV radiation from the sun. SYMPTOMS  Red or pink skin.  Soreness and swelling.  Pain.  Blisters.  Peeling skin.  Headache, fever, and fatigue if sunburn covers a large area. TREATMENT  Your caregiver may tell you to take certain medicines to lessen inflammation.  Your caregiver may have you use hydrocortisone cream or spray to help with itching and inflammation.  Your caregiver may prescribe an antibiotic cream to use on blisters. HOME CARE INSTRUCTIONS   Avoid further exposure to the sun.  Cool baths and cool compresses may be helpful if used several times per day. Do not apply ice, since this may result in more damage to the skin.  Only take over-the-counter or prescription medicines for pain, discomfort, or fever as directed by your caregiver.  Use aloe or other over-the-counter sunburn creams or gels on your skin. Do not apply these creams or gels on blisters.  Drink enough fluids to keep your urine clear or pale yellow.  Do not break blisters. If blisters break, your caregiver may recommend an antibiotic cream to apply to the affected area. PREVENTION   Try to avoid the sun between 10:00 a.m. and 4:00 p.m. when it is the strongest.  Apply sunscreen at least 30 minutes before exposure to the sun.  Always wear protective hats, clothing, and sunglasses with UV protection.  Avoid medicines, herbs, and foods that increase your sensitivity to sunlight.  Avoid tanning beds. SEEK IMMEDIATE MEDICAL CARE IF:   You have a fever.  Your pain is uncontrolled with medicine.  You start to vomit or have diarrhea.  You feel faint or develop a  headache with confusion.  You develop severe blistering.  You have a pus-like (purulent) discharge coming from the blisters.  Your burn becomes more painful and swollen. MAKE SURE YOU:  Understand these instructions.  Will watch your condition.  Will get help right away if you are not doing well or get worse.   This information is not intended to replace advice given to you by your health care provider. Make sure you discuss any questions you have with your health care provider.   Document Released: 02/22/2005 Document Revised: 09/09/2012 Document Reviewed: 11/16/2014 Elsevier Interactive Patient Education [employer].

## 2015-11-03 NOTE — ED Notes (Signed)
Pt  Reports  Symptoms  Of  Sunburn   X   1   Week      Pt  Alert  And  Oriented    Speaking  In   Complete    sentances    Appears  In no  Acute  Distress       Ambulated  To  Room  With  Steady  Fluid  Gait
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
## 2015-11-05 ENCOUNTER — Encounter: Payer: Self-pay | Admitting: Family Medicine

## 2015-11-05 ENCOUNTER — Ambulatory Visit (INDEPENDENT_AMBULATORY_CARE_PROVIDER_SITE_OTHER): Payer: Medicare Other | Admitting: Family Medicine

## 2015-11-05 VITALS — BP 148/90 | HR 98 | Temp 97.6°F | Wt 163.0 lb

## 2015-11-05 DIAGNOSIS — J449 Chronic obstructive pulmonary disease, unspecified: Secondary | ICD-10-CM | POA: Diagnosis not present

## 2015-11-05 DIAGNOSIS — Z720 Tobacco use: Secondary | ICD-10-CM | POA: Diagnosis not present

## 2015-11-05 DIAGNOSIS — B351 Tinea unguium: Secondary | ICD-10-CM | POA: Diagnosis present

## 2015-11-05 DIAGNOSIS — Z23 Encounter for immunization: Secondary | ICD-10-CM | POA: Diagnosis not present

## 2015-11-05 DIAGNOSIS — I639 Cerebral infarction, unspecified: Secondary | ICD-10-CM

## 2015-11-05 DIAGNOSIS — L551 Sunburn of second degree: Secondary | ICD-10-CM

## 2015-11-05 NOTE — Progress Notes (Signed)
   Subjective:    Patient ID: [name], male    DOB: 01-Dec-1955, 60 y.o.   MRN: [id number]  [name] is a 60 y.o. male presenting on 11/05/2015 for Sunburn   Patient presents for a same day appointment.   HPI  SUNBURN, BACK / SHOULDERS: - Reports that he developed a severe sunburn across his upper back and both posterior shoulders about 2 weeks ago, he was [place] outside [place] without a shirt for 3 consecutive days, he started with some sunburn initial day but continued, then developed some blistering by day 3, these burst with some clear fluid. Now gradual healing but still painful and "burning" admits some itching. Has not put any topicals on it, limited ability to reach mid back, but can reach shoulders. - Admits occasional chills - Denies any fevers, nausea, vomiting   Social History  Substance Use Topics  . Smoking status: Current Every Day Smoker -- 1.00 packs/day for 45 years    Types: Cigarettes    Start date: 05/29/1968  . Smokeless tobacco: Never Used     Comment: cutting back  . Alcohol Use: No     Comment: past per patient none since 2005    Review of Systems Per HPI unless specifically indicated above     Objective:    BP 148/90 mmHg  Pulse 98  Temp(Src) 97.6 F (36.4 C) (Oral)  Wt 163 lb (73.936 kg)  Wt Readings from Last 3 Encounters:  11/05/15 163 lb (73.936 kg)  10/20/15 161 lb (73.029 kg)  08/19/15 158 lb 14.4 oz (72.077 kg)    Physical Exam  Constitutional: He appears well-developed and well-nourished. No distress.  Well-appearing, comfortable, cooperative  Neurological: He is alert.  Skin: Skin is warm and dry. He is not diaphoretic. No erythema.  Upper back and posterior shoulders bilaterally with extensive healing 2nd degree sunburn now without blisters but scaling healing skin, mostly re-epithelialized without any ulceration or open skin. No erythema or edema, no drainage or bleeding. Mild tender to palpation.  Nursing note  and vitals reviewed.           Assessment & Plan:   Problem List Items Addressed This Visit    None    Visit Diagnoses    Second degree sunburn    -  Primary       Prior second degree sunburn across upper back/shoulders from recent excessive sun exposure w/o sunscreen. Gradual healing x 2 weeks, no longer blistering. No evidence of secondary infection. Inadequate treatment, no topicals - Reassurance, will gradually heal, slower given active smoker - Start topical Aloe ointment regularly - Tylenol/Nsaid PRN pain - Use sunscreen,  precautions - Return criteria given.   No orders of the defined types were placed in this encounter.    Follow up plan: Return in about 4 weeks (around 12/03/2015), or if symptoms worsen or fail to improve, for sunburn.  [name], [place], PGY-3

## 2015-11-05 NOTE — Patient Instructions (Addendum)
Thank you for coming in to clinic today.  1. You have a Second Degree Sunburn (since it blistered), it is healing this will be a slow process, it may take a few more weeks. It is slowed healing because you are still smoking. Recommend quitting smoking. - Start using topical Aloe Ointment - use several times a day to keep shoulders coated, this will help it heal - You may take Ibuprofen as needed for pain or discomfort - it will itch as it is healing this is normal  If worsening with redness oozing or fevers or sign of infection, then please return for re-evaluation.  Please schedule a follow-up appointment with Dr Andria Frames within 4 weeks if not improving sunburn  If you have any other questions or concerns, please feel free to call the clinic to contact me. You may also schedule an earlier appointment if necessary.  However, if your symptoms get significantly worse, please go to the Emergency Department to seek immediate medical attention.  [name], [place]

## 2015-11-17 MED FILL — ATORVASTATIN 20 MG TABLET: 20 | 30 days supply | Qty: 30 | Fill #3

## 2015-11-24 ENCOUNTER — Ambulatory Visit (INDEPENDENT_AMBULATORY_CARE_PROVIDER_SITE_OTHER): Payer: Medicare Other | Admitting: Family Medicine

## 2015-11-24 ENCOUNTER — Encounter: Payer: Self-pay | Admitting: Family Medicine

## 2015-11-24 VITALS — BP 165/84 | HR 95 | Temp 97.5°F | Ht 67.0 in | Wt 163.0 lb

## 2015-11-24 DIAGNOSIS — I739 Peripheral vascular disease, unspecified: Secondary | ICD-10-CM | POA: Diagnosis not present

## 2015-11-24 DIAGNOSIS — J449 Chronic obstructive pulmonary disease, unspecified: Secondary | ICD-10-CM | POA: Diagnosis not present

## 2015-11-24 DIAGNOSIS — Z23 Encounter for immunization: Secondary | ICD-10-CM | POA: Diagnosis not present

## 2015-11-24 DIAGNOSIS — F321 Major depressive disorder, single episode, moderate: Secondary | ICD-10-CM

## 2015-11-24 DIAGNOSIS — B351 Tinea unguium: Secondary | ICD-10-CM | POA: Diagnosis present

## 2015-11-24 DIAGNOSIS — F17200 Nicotine dependence, unspecified, uncomplicated: Secondary | ICD-10-CM

## 2015-11-24 DIAGNOSIS — Z720 Tobacco use: Secondary | ICD-10-CM | POA: Diagnosis not present

## 2015-11-24 DIAGNOSIS — I639 Cerebral infarction, unspecified: Secondary | ICD-10-CM | POA: Diagnosis not present

## 2015-11-24 MED ORDER — FLUTICASONE PROPIONATE HFA 110 MCG/ACT IN AERO: 1.0000 | INHALATION_SPRAY | Freq: Two times a day (BID) | RESPIRATORY_TRACT | Status: DC

## 2015-11-24 NOTE — Assessment & Plan Note (Signed)
Add controler

## 2015-11-24 NOTE — Patient Instructions (Signed)
Use the new inhaler twice every day. Quit smoking. Think about ways to make your life bigger.  Church? See me in two months - sooner if the cough get worse. Try to find a friend to get the colonoscopy done.
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
## 2015-11-25 NOTE — Assessment & Plan Note (Signed)
Vital that he quit smoking.

## 2015-11-25 NOTE — Assessment & Plan Note (Signed)
Poorly controled.  Isolation worsening.  Needs to get out.

## 2015-11-26 ENCOUNTER — Encounter: Payer: Self-pay | Admitting: Gastroenterology

## 2015-12-13 ENCOUNTER — Encounter: Payer: Self-pay | Admitting: Infectious Diseases

## 2015-12-13 ENCOUNTER — Ambulatory Visit (INDEPENDENT_AMBULATORY_CARE_PROVIDER_SITE_OTHER): Payer: Medicare Other | Admitting: Infectious Diseases

## 2015-12-13 VITALS — BP 145/90 | HR 93 | Temp 97.3°F | Wt 165.0 lb

## 2015-12-13 DIAGNOSIS — Z720 Tobacco use: Secondary | ICD-10-CM | POA: Diagnosis not present

## 2015-12-13 DIAGNOSIS — B351 Tinea unguium: Secondary | ICD-10-CM | POA: Diagnosis present

## 2015-12-13 DIAGNOSIS — Z23 Encounter for immunization: Secondary | ICD-10-CM | POA: Diagnosis not present

## 2015-12-13 DIAGNOSIS — I639 Cerebral infarction, unspecified: Secondary | ICD-10-CM | POA: Diagnosis not present

## 2015-12-13 DIAGNOSIS — B182 Chronic viral hepatitis C: Secondary | ICD-10-CM

## 2015-12-13 DIAGNOSIS — J449 Chronic obstructive pulmonary disease, unspecified: Secondary | ICD-10-CM | POA: Diagnosis not present

## 2015-12-13 NOTE — Assessment & Plan Note (Signed)
He appears to be "cured" Will repeat his RNA today.  He will need liver u/s every 6 months, can f/u with Dr Andria Frames.

## 2015-12-13 NOTE — Progress Notes (Signed)
   Subjective:    Patient ID: [name], male    DOB: 1955/10/24, 60 y.o.   MRN: [id number]  HPI 60 yo M with hx of PVD, seen in ID early January for concern about MAI. He had 2 sputum sent for AFB- one grew M gordonae, one negative. His Gordonae was felt to be a Publishing copy. He was also noted to have Hep C. His labs have shown normal LFTs, normal INR. His Fibrosis score was F3.  He is genotype 1a and VL 3.5 million.  He was started on zepatier March 2017 after his lipitor dose was decreased. He had repeat Hep C VL negative on 09-16-15 and 10-20-15. States he finished his Hep C therapy 3 weeks ago.  Has been feeling well.   Review of Systems  Constitutional: Negative for appetite change and unexpected weight change.  Respiratory: Negative for shortness of breath.   Cardiovascular: Negative for leg swelling.  Gastrointestinal: Negative for diarrhea and constipation.  Genitourinary: Negative for difficulty urinating.  Psychiatric/Behavioral: Positive for dysphoric mood.       Objective:   Physical Exam  Constitutional: He appears well-developed and well-nourished.  HENT:  Mouth/Throat: No oropharyngeal exudate.  Eyes: EOM are normal. Pupils are equal, round, and reactive to light. No scleral icterus.  Neck: Neck supple.  Cardiovascular: Normal rate, regular rhythm and normal heart sounds.   Pulmonary/Chest: Effort normal and breath sounds normal.  Lymphadenopathy:    He has no cervical adenopathy.       Assessment & Plan:

## 2015-12-14 LAB — HEPATITIS C RNA QUANTITATIVE: HCV QUANT: NOT DETECTED [IU]/mL (ref <15)

## 2015-12-15 ENCOUNTER — Ambulatory Visit (HOSPITAL_COMMUNITY)
Admission: EM | Admit: 2015-12-15 | Discharge: 2015-12-15 | Disposition: A | Discharge status: Home / Self Care | Payer: Medicare Other | Attending: Emergency Medicine | Admitting: Emergency Medicine

## 2015-12-15 ENCOUNTER — Encounter (HOSPITAL_COMMUNITY): Payer: Self-pay | Admitting: *Deleted

## 2015-12-15 DIAGNOSIS — T63441A Toxic effect of venom of bees, accidental (unintentional), initial encounter: Secondary | ICD-10-CM | POA: Diagnosis not present

## 2015-12-15 MED ORDER — PREDNISONE 20 MG PO TABS: ORAL_TABLET | ORAL | Status: DC
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
## 2015-12-15 NOTE — Discharge Instructions (Signed)
Bee, Wasp, or Hornet Sting Place cold compresses to the areas of stinging to help with pain, redness and swelling. Take Benadryl 50 mg every 4-6 hours. Start taking the prednisone today. Also recommend that you you sure albuterol inhaler to help you breathe better. Bees, wasps, and hornets are part of a family of insects that can sting people. These stings can cause pain and inflammation, but they are usually not serious. However, some people may have an allergic reaction to a sting. This can cause the symptoms to be more severe.  SYMPTOMS  Common symptoms of this condition include:   A red lump in the skin that sometimes has a tiny hole in the center. In some cases, a stinger may be in the center of the wound.  Pain and itching at the sting site.  Redness and swelling around the sting site. If you have an allergic reaction (localized allergic reaction), the swelling and redness may spread out from the sting site. In some cases, this reaction can continue to develop over the next 12-36 hours. In rare cases, a person may have a severe allergic reaction (anaphylactic reaction) to a sting. Symptoms of an anaphylactic reaction may include:   Wheezing or difficulty breathing.  Raised, itchy, red patches on the skin.  Nausea or vomiting.  Abdominal cramping.  Diarrhea.  Chest pain.  Fainting.  Redness of the face (flushing). DIAGNOSIS  This condition is usually diagnosed based on symptoms, medical history, and a physical exam. TREATMENT  Most stings can be treated with:   Icing to reduce swelling.  Medicines (antihistamines) to treat itching or an allergic reaction.  Medicines to help reduce pain. These may be medicines that you take by mouth, or medicated creams or lotions that you apply to your skin. If you were stung by a bee, the stinger and a small sac of poison may be in the wound. This may be removed by brushing across it with a flat card, such as a credit card. Another  method is to pinch the area and pull it out. These methods can help reduce the severity of the body's reaction to the sting.  HOME CARE INSTRUCTIONS   Wash the sting site daily with soap and water as told by your health care provider.  Apply or take over-the-counter and prescription medicines only as told by your health care provider.  If directed, apply ice to the sting area.  Put ice in a plastic bag.  Place a towel between your skin and the bag.  Leave the ice on for 20 minutes, 2-3 times per day.  Do not scratch the sting area.  To lessen pain, try using a paste that is made of water and baking soda. Rub the paste on the sting area and leave it on for 5 minutes.  If you had a severe allergic reaction to a sting, you may need:  To wear a medical bracelet or necklace that lists the allergy.  To learn when and how to use an anaphylaxis kit or epinephrine injection. Your family members may also need to learn this.  To carry an anaphylaxis kit with you at all times. SEEK MEDICAL CARE IF:   Your symptoms do not get better in 2-3 days.  You have redness, swelling, or pain that spreads beyond the area of the sting.  You have a fever. SEEK IMMEDIATE MEDICAL CARE IF:  You have symptoms of a severe allergic reaction. These include:   Wheezing or difficulty breathing.  Chest  pain.  Light-headedness or fainting.  Itchy, raised, red patches on the skin.  Nausea or vomiting.  Abdominal cramping.  Diarrhea.   This information is not intended to replace advice given to you by your health care provider. Make sure you discuss any questions you have with your health care provider.   Document Released: 05/15/2005 Document Revised: 02/03/2015 Document Reviewed: 09/30/2014 Elsevier Interactive Patient Education 2016 [place].  Anaphylactic Reaction An anaphylactic reaction is a sudden, severe allergic reaction. It affects the whole body. It can be life threatening. You  may need to stay in the hospital.  [place] a medical bracelet or necklace that lists your allergy.  Carry your allergy kit or medicine shot to treat severe allergic reactions with you. These can save your life.  Do not drive until medicine from your shot has worn off, unless your doctor says it is okay.  If you have hives or a rash:  Take medicine as told by your doctor.  You may take over-the-counter antihistamine medicine.  Place cold cloths on your skin. Take baths in cool water. Avoid hot baths and hot showers. GET HELP RIGHT AWAY IF:   Your mouth is puffy (swollen), or you have trouble breathing.  You start making whistling sounds when you breathe (wheezing).  You have a tight feeling in your chest or throat.  You have a rash, hives, puffiness, or itching on your body.  You throw up (vomit) or have watery poop (diarrhea).  You feel dizzy or pass out (faint).  You think you are having an allergic reaction.  You have new symptoms. This is an emergency. Use your medicine shot or allergy kit as told. Call your local emergency services (911 in [place] Even if you feel better after the shot, you need to go to the hospital emergency department. MAKE SURE YOU:   Understand these instructions.  Will watch your condition.  Will get help right away if you are not doing well or get worse.   This information is not intended to replace advice given to you by your health care provider. Make sure you discuss any questions you have with your health care provider.   Document Released: 11/01/2007 Document Revised: 11/14/2011 Document Reviewed: 11/25/2014 Elsevier Interactive Patient Education [employer].

## 2015-12-15 NOTE — ED Notes (Signed)
Pt  reports   Symptoms    Of        bee  Stung    In  Both  Hands         Yellow  Jackets           Today  Pain  And  Swelling           No  Angioedema          speking  In   Complete sentances

## 2015-12-27 MED FILL — ATORVASTATIN 20 MG TABLET: 20 | 30 days supply | Qty: 30 | Fill #4

## 2015-12-30 ENCOUNTER — Ambulatory Visit (AMBULATORY_SURGERY_CENTER): Payer: Self-pay

## 2015-12-30 ENCOUNTER — Telehealth: Payer: Self-pay

## 2015-12-30 VITALS — Ht 67.0 in | Wt 166.2 lb

## 2015-12-30 DIAGNOSIS — Z1211 Encounter for screening for malignant neoplasm of colon: Secondary | ICD-10-CM

## 2015-12-30 DIAGNOSIS — Z860100 Personal history of colon polyps, unspecified: Secondary | ICD-10-CM

## 2015-12-30 MED ORDER — SUPREP BOWEL PREP KIT 17.5-3.13-1.6 GM/177ML PO SOLN: 1.0000 | Freq: Once | ORAL | 0 refills | Status: AC

## 2016-01-05 ENCOUNTER — Ambulatory Visit (INDEPENDENT_AMBULATORY_CARE_PROVIDER_SITE_OTHER): Payer: Medicare Other | Admitting: Family Medicine

## 2016-01-05 ENCOUNTER — Ambulatory Visit
Admission: RE | Admit: 2016-01-05 | Discharge: 2016-01-05 | Disposition: A | Discharge status: Home / Self Care | Payer: Medicare Other | Source: Ambulatory Visit | Attending: Family Medicine | Admitting: Family Medicine

## 2016-01-05 ENCOUNTER — Encounter: Payer: Self-pay | Admitting: Family Medicine

## 2016-01-05 DIAGNOSIS — R05 Cough: Secondary | ICD-10-CM | POA: Diagnosis not present

## 2016-01-05 DIAGNOSIS — Z23 Encounter for immunization: Secondary | ICD-10-CM | POA: Diagnosis not present

## 2016-01-05 DIAGNOSIS — I639 Cerebral infarction, unspecified: Secondary | ICD-10-CM | POA: Diagnosis not present

## 2016-01-05 DIAGNOSIS — J449 Chronic obstructive pulmonary disease, unspecified: Secondary | ICD-10-CM

## 2016-01-05 DIAGNOSIS — F17200 Nicotine dependence, unspecified, uncomplicated: Secondary | ICD-10-CM

## 2016-01-05 DIAGNOSIS — B351 Tinea unguium: Secondary | ICD-10-CM | POA: Diagnosis present

## 2016-01-05 DIAGNOSIS — Z720 Tobacco use: Secondary | ICD-10-CM | POA: Diagnosis not present

## 2016-01-05 NOTE — Progress Notes (Signed)
Subjective:     Patient ID: [name], male   DOB: 12-21-1955, 60 y.o.   MRN: [id number]  [name] is a 60 year old male with a history most notable for tobacco abuse, small cell lung cancer, COPD, HTN, prior CVA  in clinic today for evaluation of chronic cough.  -Chronic Cough The patient reports that he has had a cough that has become more bothersome over the last several weeks, but admits that it has not worsened from prior other than increased nasal congestion. He still smokes half a pack of cigarettes/day. It occurs throughout the day and is occasionally productive of dark sputum. He has not tried anything interventions to improve his symptoms. He denies fever, N/V, seasonal allergies, shortness of breath, sore throat, hemoptysis, chest pain, headache/dizziness, bowel or urinary changes.  Of note, recent imaging done in 05/2015 are consistent remission of the R sided lung malignancy with post-radiation fibrosis of lung tissue.      Review of Systems Pertinent positives and negatives per HPI.    Objective:   Physical Exam  Constitutional: He is oriented to person, place, and time. He appears well-developed and well-nourished. No distress.  Eyes: EOM are normal. Pupils are equal, round, and reactive to light. No scleral icterus.  Cardiovascular: Normal rate, regular rhythm and normal heart sounds.   No murmur heard. Pulmonary/Chest: Effort normal. No respiratory distress. He has wheezes. He exhibits no tenderness.  Musculoskeletal: He exhibits no edema.  Neurological: He is alert and oriented to person, place, and time.       Assessment:     [name] is a 60 year old male with a history most notable for tobacco abuse, small cell lung cancer, COPD, HTN, prior CVA  in clinic today for evaluation of chronic cough.    Plan:   Cough: likely chronic due to smoking hx/COPD. Pt continues to smoke 0.5 pk/d -continue fluticasone inhaler two times  daily -ordered CXR -discussed smoking cessation given pt symptoms and hx of lung cancer  -return to clinic if worsening of sx or if CXR findings abnormal

## 2016-01-05 NOTE — Patient Instructions (Signed)
I will call with x ray results  Call me in 1 week to tell me about quitting smoking See me in 3 months if the chest x ray is good. Get your colonoscopy done.

## 2016-01-05 NOTE — Assessment & Plan Note (Signed)
Discussed cessation strategies.

## 2016-01-05 NOTE — Assessment & Plan Note (Addendum)
Persistent cough and still smoking Will order CXR.

## 2016-01-06 ENCOUNTER — Telehealth: Payer: Self-pay | Admitting: Family Medicine

## 2016-01-11 ENCOUNTER — Encounter: Payer: Self-pay | Admitting: Gastroenterology

## 2016-01-11 ENCOUNTER — Ambulatory Visit (AMBULATORY_SURGERY_CENTER): Payer: Medicare Other | Admitting: Gastroenterology

## 2016-01-11 VITALS — BP 120/82 | HR 80 | Temp 97.1°F | Resp 18 | Ht 67.0 in | Wt 166.0 lb

## 2016-01-11 DIAGNOSIS — D123 Benign neoplasm of transverse colon: Secondary | ICD-10-CM | POA: Diagnosis not present

## 2016-01-11 DIAGNOSIS — Z86010 Personal history of colonic polyps: Secondary | ICD-10-CM

## 2016-01-11 DIAGNOSIS — F329 Major depressive disorder, single episode, unspecified: Secondary | ICD-10-CM | POA: Diagnosis not present

## 2016-01-11 DIAGNOSIS — B1920 Unspecified viral hepatitis C without hepatic coma: Secondary | ICD-10-CM | POA: Diagnosis not present

## 2016-01-11 DIAGNOSIS — I739 Peripheral vascular disease, unspecified: Secondary | ICD-10-CM | POA: Diagnosis not present

## 2016-01-11 DIAGNOSIS — J449 Chronic obstructive pulmonary disease, unspecified: Secondary | ICD-10-CM | POA: Diagnosis not present

## 2016-01-11 MED ORDER — SODIUM CHLORIDE 0.9 % IV SOLN: 500.0000 mL | INTRAVENOUS | Status: DC

## 2016-01-11 NOTE — Progress Notes (Signed)
Report to PACU, RN, vss, BBS= Clear.  

## 2016-01-11 NOTE — Patient Instructions (Signed)
YOU HAD AN ENDOSCOPIC PROCEDURE TODAY AT [facility]:   Refer to the procedure report that was given to you for any specific questions about what was found during the examination.  If the procedure report does not answer your questions, please call your gastroenterologist to clarify.  If you requested that your care partner not be given the details of your procedure findings, then the procedure report has been included in a sealed envelope for you to review at your convenience later.  YOU SHOULD EXPECT: Some feelings of bloating in the abdomen. Passage of more gas than usual.  Walking can help get rid of the air that was put into your GI tract during the procedure and reduce the bloating. If you had a lower endoscopy (such as a colonoscopy or flexible sigmoidoscopy) you may notice spotting of blood in your stool or on the toilet paper. If you underwent a bowel prep for your procedure, you may not have a normal bowel movement for a few days.  Please Note:  You might notice some irritation and congestion in your nose or some drainage.  This is from the oxygen used during your procedure.  There is no need for concern and it should clear up in a day or so.  SYMPTOMS TO REPORT IMMEDIATELY:   Following lower endoscopy (colonoscopy or flexible sigmoidoscopy):  Excessive amounts of blood in the stool  Significant tenderness or worsening of abdominal pains  Swelling of the abdomen that is new, acute  Fever of 100F or higher   Following upper endoscopy (EGD)  Vomiting of blood or coffee ground material  New chest pain or pain under the shoulder blades  Painful or persistently difficult swallowing  New shortness of breath  Fever of 100F or higher  Black, tarry-looking stools  For urgent or emergent issues, a gastroenterologist can be reached at any hour by calling [phone].   DIET:  We do recommend a small meal at first, but then you may proceed to your regular diet.  Drink  plenty of fluids but you should avoid alcoholic beverages for 24 hours.  ACTIVITY:  You should plan to take it easy for the rest of today and you should NOT DRIVE or use heavy machinery until tomorrow (because of the sedation medicines used during the test).    FOLLOW UP: Our staff will call the number listed on your records the next business day following your procedure to check on you and address any questions or concerns that you may have regarding the information given to you following your procedure. If we do not reach you, we will leave a message.  However, if you are feeling well and you are not experiencing any problems, there is no need to return our call.  We will assume that you have returned to your regular daily activities without incident.  If any biopsies were taken you will be contacted by phone or by letter within the next 1-3 weeks.  Please call us at [phone] if you have not heard about the biopsies in 3 weeks.    SIGNATURES/CONFIDENTIALITY: You and/or your care partner have signed paperwork which will be entered into your electronic medical record.  These signatures attest to the fact that that the information above on your After Visit Summary has been reviewed and is understood.  Full responsibility of the confidentiality of this discharge information lies with you and/or your care-partner.YOU HAD AN ENDOSCOPIC PROCEDURE TODAY AT [facility]:  Refer to the procedure report that was given to you for any specific questions about what was found during the examination.  If the procedure report does not answer your questions, please call your gastroenterologist to clarify.  If you requested that your care partner not be given the details of your procedure findings, then the procedure report has been included in a sealed envelope for you to review at your convenience later.  YOU SHOULD EXPECT: Some feelings of bloating in the abdomen. Passage of more gas than  usual.  Walking can help get rid of the air that was put into your GI tract during the procedure and reduce the bloating. If you had a lower endoscopy (such as a colonoscopy or flexible sigmoidoscopy) you may notice spotting of blood in your stool or on the toilet paper. If you underwent a bowel prep for your procedure, you may not have a normal bowel movement for a few days.  Please Note:  You might notice some irritation and congestion in your nose or some drainage.  This is from the oxygen used during your procedure.  There is no need for concern and it should clear up in a day or so.  SYMPTOMS TO REPORT IMMEDIATELY:   Following lower endoscopy (colonoscopy or flexible sigmoidoscopy):  Excessive amounts of blood in the stool  Significant tenderness or worsening of abdominal pains  Swelling of the abdomen that is new, acute  Fever of 100F or higher   Following upper endoscopy (EGD)  Vomiting of blood or coffee ground material  New chest pain or pain under the shoulder blades  Painful or persistently difficult swallowing  New shortness of breath  Fever of 100F or higher  Black, tarry-looking stools  For urgent or emergent issues, a gastroenterologist can be reached at any hour by calling [phone].   DIET:  We do recommend a small meal at first, but then you may proceed to your regular diet.  Drink plenty of fluids, but no alcoholic beverages for 24 hours.  ACTIVITY:  You should plan to take it easy for the rest of today and you should NOT DRIVE or use heavy machinery until tomorrow (because of the sedation medicines used during the test).    FOLLOW UP: Our staff will call the number listed on your records the next business day following your procedure to check on you and address any questions or concerns that you may have regarding the information given to you following your procedure. If we do not reach you, we will leave a message.  However, if you are feeling well and you  are not experiencing any problems, there is no need to return our call.  We will assume that you have returned to your regular daily activities without incident.  If any biopsies were taken you will be contacted by phone or by letter within the next 1-3 weeks.  Please call us at [phone] if you have not heard about the biopsies in 3 weeks.    SIGNATURES/CONFIDENTIALITY: You and/or your care partner have signed paperwork which will be entered into your electronic medical record.  These signatures attest to the fact that that the information above on your After Visit Summary has been reviewed and is understood.  Full responsibility of the confidentiality of this discharge information lies with you and/or your care-partner.  Read all of the handouts given to you by your recovery room nurse today.

## 2016-01-11 NOTE — Op Note (Signed)
[facility] Patient Name: [name] Procedure Date: 01/11/2016 10:16 AM MRN: [id number] Endoscopist: Ladene Artist , MD Age: 60 Referring MD:  Date of Birth: 01/17/1956 Gender: Male Account #: [id number] Procedure:                Colonoscopy Indications:              Surveillance: Personal history of adenomatous                            polyps on last colonoscopy > 3 years ago Medicines:                Monitored Anesthesia Care Procedure:                Pre-Anesthesia Assessment:                           - Prior to the procedure, a History and Physical                            was performed, and patient medications and                            allergies were reviewed. The patient's tolerance of                            previous anesthesia was also reviewed. The risks                            and benefits of the procedure and the sedation                            options and risks were discussed with the patient.                            All questions were answered, and informed consent                            was obtained. Prior Anticoagulants: The patient has                            taken no previous anticoagulant or antiplatelet                            agents. ASA Grade Assessment: III - A patient with                            severe systemic disease. After reviewing the risks                            and benefits, the patient was deemed in                            satisfactory condition to undergo the procedure.  After obtaining informed consent, the colonoscope                            was passed under direct vision. Throughout the                            procedure, the patient's blood pressure, pulse, and                            oxygen saturations were monitored continuously. The                            Model PCF-H190L [id number]) scope was introduced                            through the anus  and advanced to the the cecum,                            identified by appendiceal orifice and ileocecal                            valve. The ileocecal valve, appendiceal orifice,                            and rectum were photographed. The quality of the                            bowel preparation was good. The colonoscopy was                            performed without difficulty. The patient tolerated                            the procedure well. Scope In: 10:27:49 AM Scope Out: 10:39:57 AM Scope Withdrawal Time: 0 hours 10 minutes 9 seconds  Total Procedure Duration: 0 hours 12 minutes 8 seconds  Findings:                 A 6 mm polyp was found in the transverse colon. The                            polyp was sessile. The polyp was removed with a                            cold snare. Resection and retrieval were complete.                           The exam was otherwise without abnormality on                            direct and retroflexion views.                           A few small-mouthed diverticula were found in the  sigmoid colon. Complications:            No immediate complications. Estimated blood loss:                            None. Estimated Blood Loss:     Estimated blood loss: none. Impression:               - One 6 mm polyp in the transverse colon, removed                            with a cold snare. Resected and retrieved.                           - Diverticulosis in the sigmoid colon.                           - The examination was otherwise normal on direct                            and retroflexion views. Recommendation:           - Repeat colonoscopy in 5 years for surveillance.                           - Patient has a contact number available for                            emergencies. The signs and symptoms of potential                            delayed complications were discussed with the                             patient. Return to normal activities tomorrow.                            Written discharge instructions were provided to the                            patient.                           - Continue present medications.                           - Await pathology results.                           - High fiber diet indefinitely. Ladene Artist, MD 01/11/2016 10:42:39 AM This report has been signed electronically.

## 2016-01-11 NOTE — Progress Notes (Signed)
Called to room to assist during endoscopic procedure.  Patient ID and intended procedure confirmed with present staff. Received instructions for my participation in the procedure from the performing physician.  

## 2016-01-12 ENCOUNTER — Telehealth: Payer: Self-pay

## 2016-01-12 NOTE — Telephone Encounter (Signed)
  Follow up Call-  Call back number 01/11/2016  Post procedure Call Back phone  # [phone]  Permission to leave phone message Yes  Some recent data might be hidden     Patient questions:  Do you have a fever, pain , or abdominal swelling? No. Pain Score  0 *  Have you tolerated food without any problems? Yes.    Have you been able to return to your normal activities? Yes.    Do you have any questions about your discharge instructions: Diet   No. Medications  No. Follow up visit  No.  Do you have questions or concerns about your Care? No.  Actions: * If pain score is 4 or above: No action needed, pain <4.

## 2016-01-17 ENCOUNTER — Other Ambulatory Visit: Payer: Self-pay | Admitting: *Deleted

## 2016-01-17 ENCOUNTER — Other Ambulatory Visit: Payer: Self-pay | Admitting: Family Medicine

## 2016-01-17 DIAGNOSIS — E7800 Pure hypercholesterolemia, unspecified: Secondary | ICD-10-CM

## 2016-01-17 MED ORDER — ATORVASTATIN CALCIUM 20 MG PO TABS: 20.0000 mg | ORAL_TABLET | Freq: Every day | ORAL | 3 refills | Status: DC

## 2016-01-17 NOTE — Telephone Encounter (Signed)
Recommending 90-day refill for atorvastatin.

## 2016-01-20 ENCOUNTER — Encounter: Payer: Self-pay | Admitting: Gastroenterology

## 2016-01-24 ENCOUNTER — Ambulatory Visit (INDEPENDENT_AMBULATORY_CARE_PROVIDER_SITE_OTHER): Payer: Medicare Other | Admitting: Student

## 2016-01-24 ENCOUNTER — Encounter: Payer: Self-pay | Admitting: Student

## 2016-01-24 VITALS — BP 158/90 | HR 90 | Temp 97.6°F | Ht 67.0 in | Wt 166.0 lb

## 2016-01-24 DIAGNOSIS — B351 Tinea unguium: Secondary | ICD-10-CM | POA: Diagnosis present

## 2016-01-24 DIAGNOSIS — N318 Other neuromuscular dysfunction of bladder: Secondary | ICD-10-CM

## 2016-01-24 DIAGNOSIS — R350 Frequency of micturition: Secondary | ICD-10-CM | POA: Diagnosis not present

## 2016-01-24 DIAGNOSIS — J449 Chronic obstructive pulmonary disease, unspecified: Secondary | ICD-10-CM | POA: Diagnosis not present

## 2016-01-24 DIAGNOSIS — Z720 Tobacco use: Secondary | ICD-10-CM | POA: Diagnosis not present

## 2016-01-24 DIAGNOSIS — I10 Essential (primary) hypertension: Secondary | ICD-10-CM

## 2016-01-24 DIAGNOSIS — I639 Cerebral infarction, unspecified: Secondary | ICD-10-CM

## 2016-01-24 DIAGNOSIS — Z23 Encounter for immunization: Secondary | ICD-10-CM | POA: Diagnosis not present

## 2016-01-24 DIAGNOSIS — N3281 Overactive bladder: Secondary | ICD-10-CM

## 2016-01-24 LAB — POCT URINALYSIS DIPSTICK
Bilirubin, UA: NEGATIVE
GLUCOSE UA: NEGATIVE
Ketones, UA: NEGATIVE
NITRITE UA: NEGATIVE
PH UA: 6
PROTEIN UA: NEGATIVE
RBC UA: NEGATIVE
SPEC GRAV UA: 1.01
UROBILINOGEN UA: 0.2

## 2016-01-24 MED ORDER — MIRABEGRON ER 25 MG PO TB24: 25.0000 mg | ORAL_TABLET | Freq: Every day | ORAL | 0 refills | Status: DC

## 2016-01-24 NOTE — Patient Instructions (Addendum)
It was great seeing you today! We have addressed the following issues today  1. Allergy incontinence: Your urine test doesn't show infection. I have sent a new prescription for Mirabegron to your pharmacy. Stop taking your Toviaz, when you start the new medication. Have also sent a referral to urology    If we did any lab work today, and the results require attention, either me or my nurse will get in touch with you. If everything is normal, you will get a letter in mail. If you don't hear from [place] in two weeks, please give [place] a call. Otherwise, I look forward to talking with you again at our next visit. If you have any questions or concerns before then, please call the clinic at [phone].  Please bring all your medications to every doctors visit   Sign up for My Chart to have easy access to your labs results, and communication with your Primary care physician.    Please check-out at the front desk before leaving the clinic.   Take Care,    Kegel Exercises The goal of Kegel exercises is to isolate and exercise your pelvic floor muscles. These muscles act as a hammock that supports the rectum, vagina, small intestine, and uterus. As the muscles weaken, the hammock sags and these organs are displaced from their normal positions. Kegel exercises can strengthen your pelvic floor muscles and help you to improve bladder and bowel control, improve sexual response, and help reduce many problems and some discomfort during pregnancy. Kegel exercises can be done anywhere and at any time. HOW TO PERFORM KEGEL EXERCISES 1. Locate your pelvic floor muscles. To do this, squeeze (contract) the muscles that you use when you try to stop the flow of urine. You will feel a tightness in the vaginal area (women) and a tight lift in the rectal area (men and women). 2. When you begin, contract your pelvic muscles tight for 2-5 seconds, then relax them for 2-5 seconds. This is one set. Do 4-5 sets with a short  pause in between. 3. Contract your pelvic muscles for 8-10 seconds, then relax them for 8-10 seconds. Do 4-5 sets. If you cannot contract your pelvic muscles for 8-10 seconds, try 5-7 seconds and work your way up to 8-10 seconds. Your goal is 4-5 sets of 10 contractions each day. Keep your stomach, buttocks, and legs relaxed during the exercises. Perform sets of both short and long contractions. Vary your positions. Perform these contractions 3-4 times per day. Perform sets while you are:   Lying in bed in the morning.  Standing at lunch.  Sitting in the late afternoon.  Lying in bed at night. You should do 40-50 contractions per day. Do not perform more Kegel exercises per day than recommended. Overexercising can cause muscle fatigue. Continue these exercises for for at least 15-20 weeks or as directed by your caregiver.   This information is not intended to replace advice given to you by your health care provider. Make sure you discuss any questions you have with your health care provider.   Document Released: 05/01/2012 Document Revised: 06/05/2014 Document Reviewed: 05/01/2012 Elsevier Interactive Patient Education [employer].

## 2016-01-24 NOTE — Progress Notes (Signed)
   Subjective:    Patient ID: [name] is a 60 y.o. old male.  HPI #Urge incontinence: this has been going on for two years. He was seen by PCP and started on medication that he doesn't remember the name. He symptoms resolved until three weeks ago. He is still taking the medication. Denies dysuria but reports frequent urination. Has never been to urologist. Denies fever, chills, abdominal pain or back pain. Denies penile discharge or lesion. Denies new medication recent illness.   PMH: History of detrusor instability SH: 50-pack-year smoking history. Still smokes a pack a day  Review of Systems Per HPI Objective:   Vitals:   01/24/16 1450 01/24/16 1528  BP: (!) 165/91 (!) 158/90  Pulse: 90   Temp: 97.6 F (36.4 C)   TempSrc: Oral   SpO2: 100%   Weight: 166 lb (75.3 kg)   Height: '5\' 7"'$  (1.702 m)     GEN: appears well, no apparent distress. 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 Urinalysis negative except for small leukocyte Estrace. No dysuria or signs of infection. He is on Toviaz, which is no longer working for him. He has never been to urologist. Recommended pelvic floor strengthening exercise. Stop Toviaz today and given prescription for Myrbetriq. This may require prior authorization. Also ordered ambulatory referral to urology for evaluation. Follow-up on urine culture.  Hypertension Blood pressure elevated today. Blood pressure 165/91 on arrival. Repeat blood pressure 158/90. His blood pressure is 120/80 about 2 weeks ago. He is not on blood pressure medication. He may need to be started on blood pressure medication if the blood pressure remains high.

## 2016-01-25 NOTE — Assessment & Plan Note (Addendum)
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 Urinalysis negative except for small leukocyte Estrace. No dysuria or signs of infection. He is on Toviaz, which is no longer working for him. He has never been to urologist. Recommended pelvic floor strengthening exercise. Stop Toviaz today and given prescription for Myrbetriq. This may require prior authorization. Also ordered ambulatory referral to urology for evaluation. Follow-up on urine culture.

## 2016-01-25 NOTE — Assessment & Plan Note (Signed)
Blood pressure elevated today. Blood pressure 165/91 on arrival. Repeat blood pressure 158/90. His blood pressure is 120/80 about 2 weeks ago. He is not on blood pressure medication. He may need to be started on blood pressure medication if the blood pressure remains high.

## 2016-01-27 LAB — URINE CULTURE

## 2016-01-28 IMAGING — CR DG CHEST 2V
2 series · 2 of 2 positions shown · non-contrast
Comparison: 02/04/2014; 08/09/2012; 12/06/2011; chest CT -
06/22/2014

CLINICAL DATA: Prolonged cough. History of right-sided lung cancer
post chemo and radiation (0006)

EXAM:
CHEST  2 VIEW

[w chest pa]
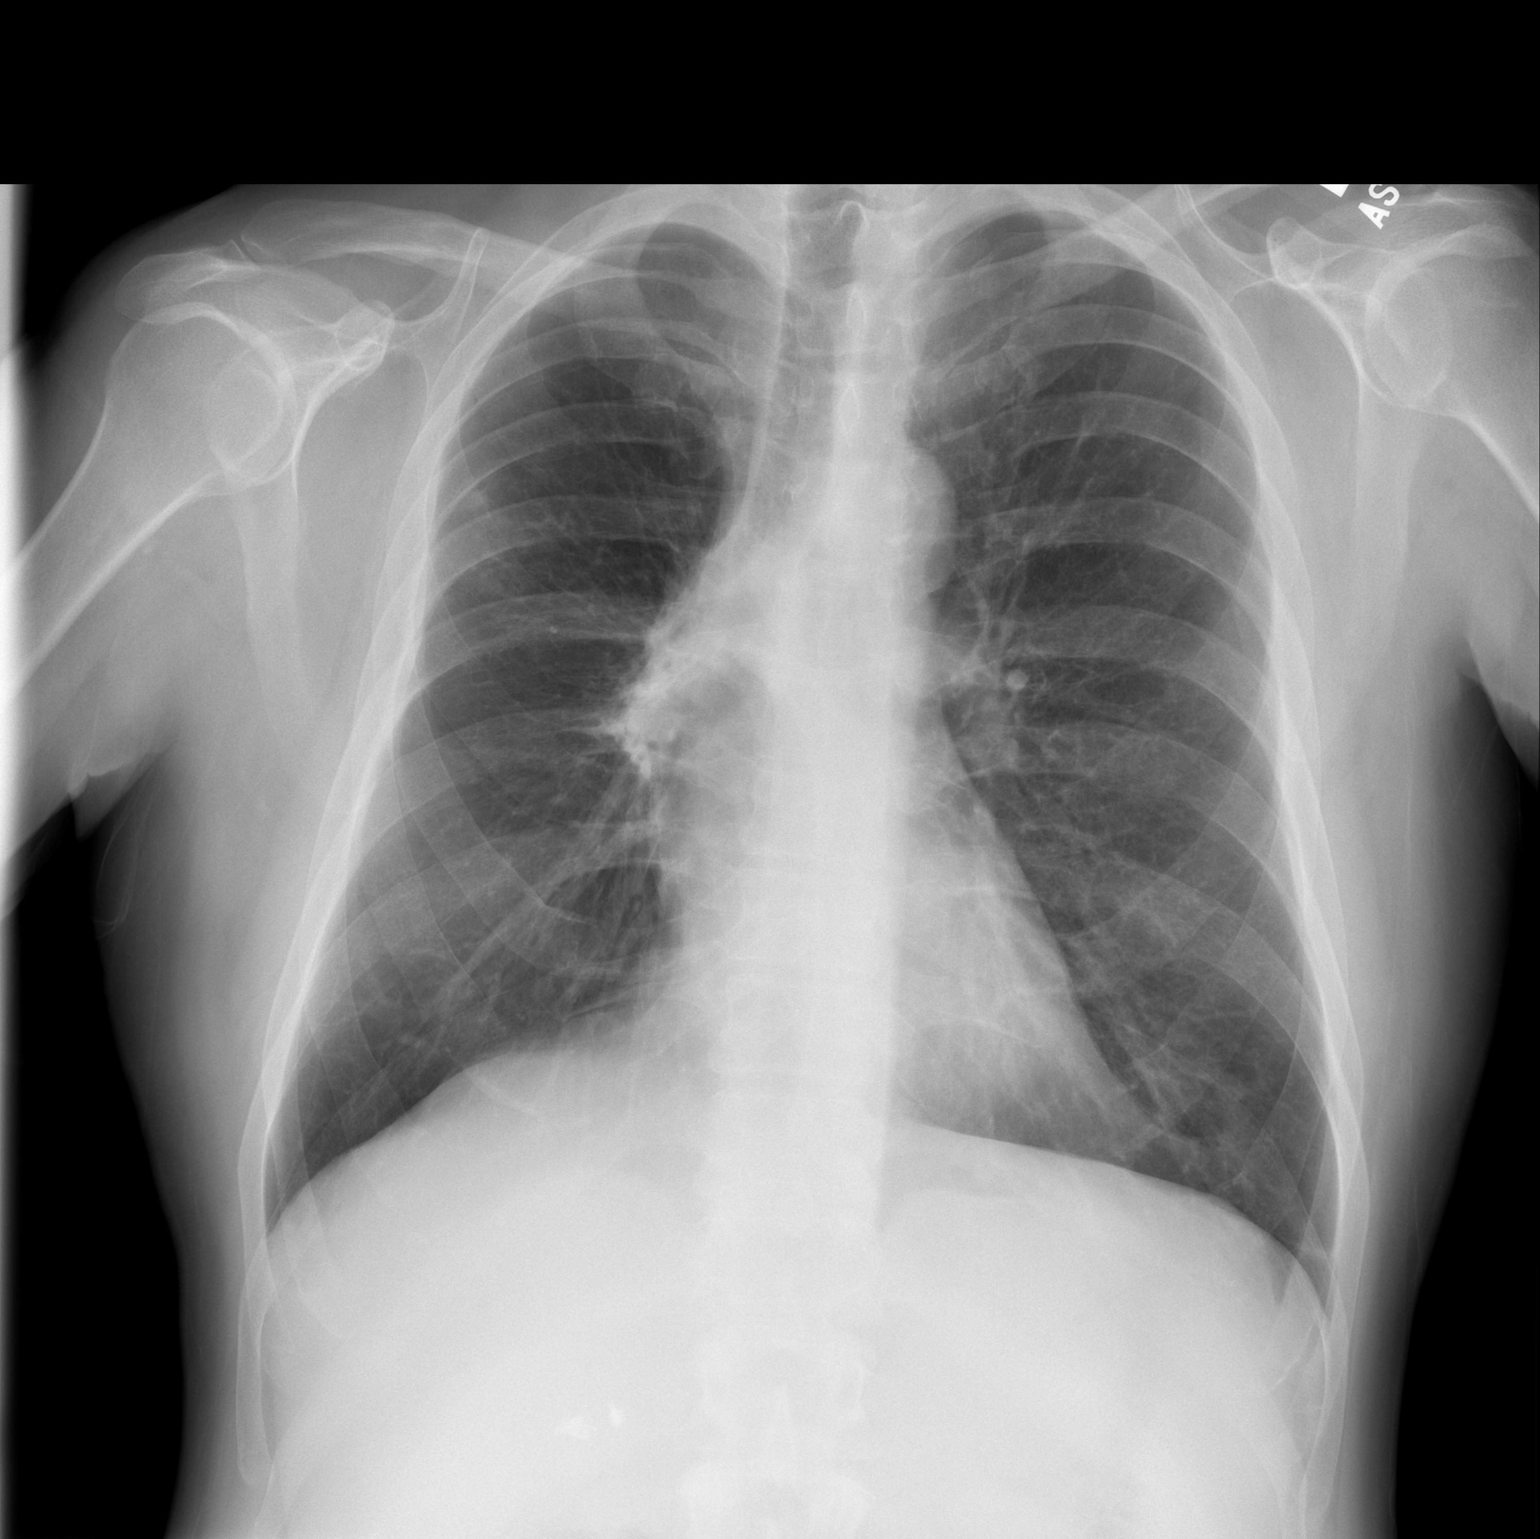

[w chest lat]
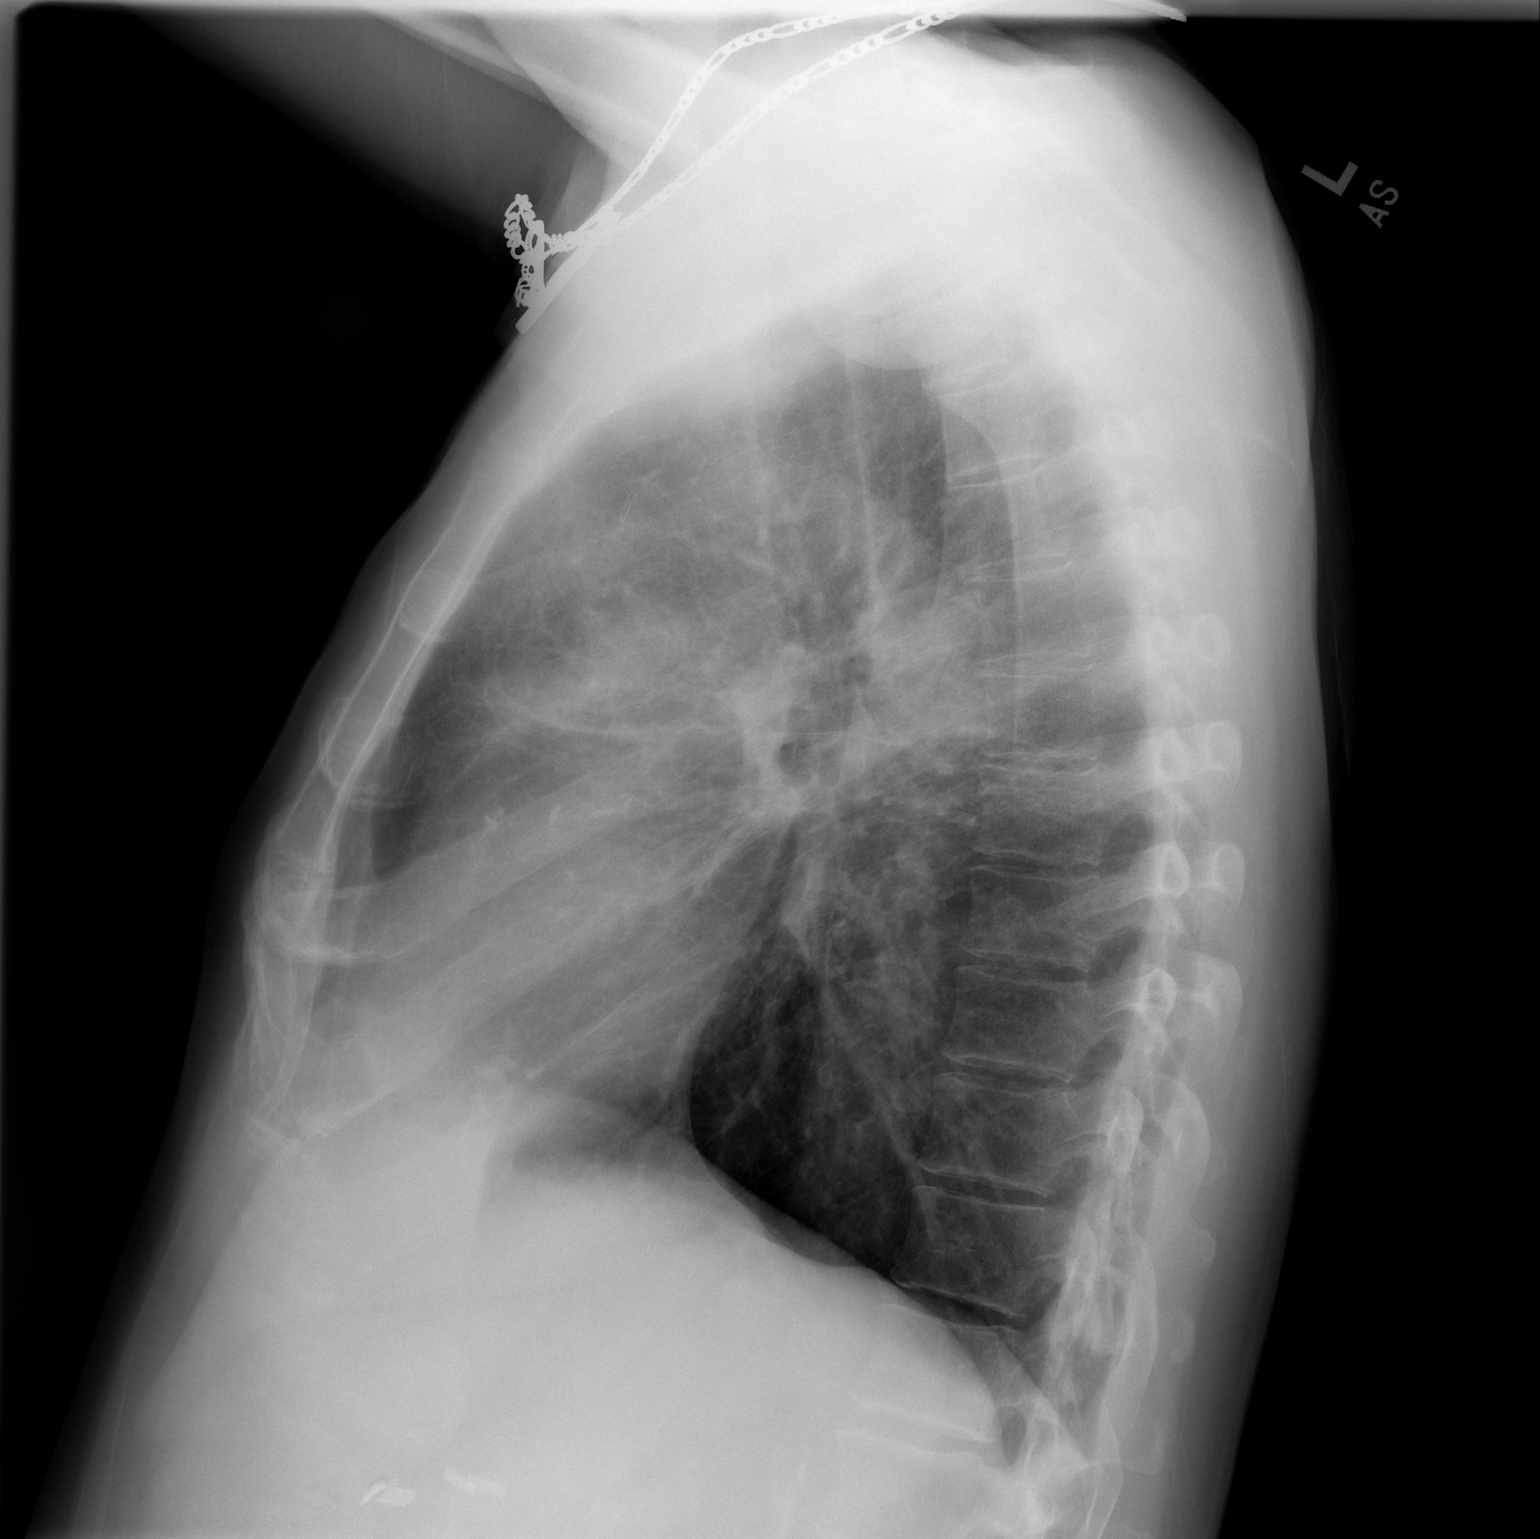

[2 of 2 positions shown; findings below may reference images not displayed]

FINDINGS: Grossly unchanged cardiac silhouette and mediastinal contours with
heterogeneous opacities about the right hilum with associated mild
right upper lobe volume loss as demonstrated on multiple prior
remote examinations. Interval development of a punctate
(approximately 0.6 cm) nodular opacity overlying the peripheral
aspect the right upper lung. No new focal airspace opacities. No
pleural effusion or pneumothorax. No evidence of edema. No acute
osseus abnormalities.
IMPRESSION: 1.  No acute cardiopulmonary disease.
2. Stable post radiation change of the right hilum.
3. Interval development of a punctate (approximately 0.6 cm)
apparent nodular opacity overlying the peripheral aspect the right
upper lung. Given history of lung cancer, further evaluation with
contrast-enhanced chest CT is recommended.
These results will be called to the ordering clinician or
representative by the Radiologist Assistant, and communication
documented in the PACS or zVision Dashboard.

## 2016-02-01 ENCOUNTER — Other Ambulatory Visit: Payer: Self-pay | Admitting: Student

## 2016-02-01 DIAGNOSIS — N390 Urinary tract infection, site not specified: Secondary | ICD-10-CM

## 2016-02-01 MED ORDER — NITROFURANTOIN MONOHYD MACRO 100 MG PO CAPS: 100.0000 mg | ORAL_CAPSULE | Freq: Two times a day (BID) | ORAL | 0 refills | Status: DC

## 2016-02-01 NOTE — Progress Notes (Signed)
Saw patient for urgent incontinence clinic. He had history of detrusor instability. Changing his medication to Myrbetriq at that visits. UA was positive for small leukocyte estrace. Ordered urine culture at the same visit that grew enterococcus 10,000-50,000 colonies which meets criteria for UTI in Men. He reports starting the new medication (Myrebetriq) but continues to endorse urge incontinence. I sent a prescription for nitrofurantoin 100 mg twice a day for 5 days to [facility] on [address] per patient's request.

## 2016-02-15 MED FILL — ATORVASTATIN 20 MG TABLET: 20 | 30 days supply | Qty: 30 | Fill #5

## 2016-02-16 ENCOUNTER — Ambulatory Visit (INDEPENDENT_AMBULATORY_CARE_PROVIDER_SITE_OTHER): Payer: Medicare Other | Admitting: Family Medicine

## 2016-02-16 VITALS — BP 135/92 | HR 98 | Temp 97.8°F | Wt 166.2 lb

## 2016-02-16 DIAGNOSIS — B351 Tinea unguium: Secondary | ICD-10-CM | POA: Diagnosis present

## 2016-02-16 DIAGNOSIS — Z23 Encounter for immunization: Secondary | ICD-10-CM | POA: Diagnosis not present

## 2016-02-16 DIAGNOSIS — J449 Chronic obstructive pulmonary disease, unspecified: Secondary | ICD-10-CM | POA: Diagnosis not present

## 2016-02-16 DIAGNOSIS — I639 Cerebral infarction, unspecified: Secondary | ICD-10-CM

## 2016-02-16 DIAGNOSIS — H9191 Unspecified hearing loss, right ear: Secondary | ICD-10-CM

## 2016-02-16 DIAGNOSIS — H6123 Impacted cerumen, bilateral: Secondary | ICD-10-CM

## 2016-02-16 DIAGNOSIS — Z720 Tobacco use: Secondary | ICD-10-CM | POA: Diagnosis not present

## 2016-02-16 NOTE — Patient Instructions (Signed)
Thank you for coming in today, it was so nice to see you! Today we talked about:    Decreased hearing. We cleaned out your ears today, you should start feeling better. If you don't, please come back to the clinic.   If you have any questions or concerns, please do not hesitate to call the office at [phone]. You can also message me directly via MyChart.   Sincerely,  Smitty Cords, MD   Cerumen Impaction The structures of the external ear canal secrete a waxy substance known as cerumen. Excess cerumen can build up in the ear canal, causing a condition known as cerumen impaction. Cerumen impaction can cause ear pain and disrupt the function of the ear. The rate of cerumen production differs for each individual. In certain individuals, the configuration of the ear canal may decrease his or her ability to naturally remove cerumen. CAUSES Cerumen impaction is caused by excessive cerumen production or buildup. RISK FACTORS  Frequent use of swabs to clean ears.  Having narrow ear canals.  Having eczema.  Being dehydrated. SIGNS AND SYMPTOMS  Diminished hearing.  Ear drainage.  Ear pain.  Ear itch. TREATMENT Treatment may involve:  Over-the-counter or prescription ear drops to soften the cerumen.  Removal of cerumen by a health care provider. This may be done with:  Irrigation with warm water. This is the most common method of removal.  Ear curettes and other instruments.  Surgery. This may be done in severe cases. HOME CARE INSTRUCTIONS  Take medicines only as directed by your health care provider.  Do not insert objects into the ear with the intent of cleaning the ear. PREVENTION  Do not insert objects into the ear, even with the intent of cleaning the ear. Removing cerumen as a part of normal hygiene is not necessary, and the use of swabs in the ear canal is not recommended.  Drink enough water to keep your urine clear or pale yellow.  Control your  eczema if you have it. SEEK MEDICAL CARE IF:  You develop ear pain.  You develop bleeding from the ear.  The cerumen does not clear after you use ear drops as directed.   This information is not intended to replace advice given to you by your health care provider. Make sure you discuss any questions you have with your health care provider.   Document Released: 06/22/2004 Document Revised: 06/05/2014 Document Reviewed: 12/30/2014 Elsevier Interactive Patient Education [employer].

## 2016-02-16 NOTE — Progress Notes (Signed)
   Subjective:    Patient ID: [name] , male   DOB: December 09, 1955 , 60 y.o..   MRN: [id number]  HPI  [name] is here for  Chief Complaint  Patient presents with  . Hearing Loss    right ear   Patient comes in today with complaints of feeling like he can't hear in his right ear. He also notes that he feels like he's been off balance for the last 2 days. He states that he was watching TV He has never had hearing problems before or balance problems before. He does not have any vertigo, dizziness, loss of consciousness, or falls. Denies any head injury. He just feels as though when he was walking he was "drunk". Denies any ringing in his ears. Denies placing any foreign bodies in his ears or Q-TIPS.   Review of Systems: Per HPI. All other systems reviewed and are negative.  Past Medical History: Patient Active Problem List   Diagnosis Date Noted  . Decreased hearing of right ear 02/16/2016  . Small cell lung cancer ([facility])   . Peripheral arterial disease ([facility])   . GERD (gastroesophageal reflux disease)   . Hepatitis C, chronic ([place] [facility]) 06/16/2015  . Major depressive disorder, single episode, moderate ([facility]) 04/09/2015  . Pain of right lower leg 04/08/2015  . Onychomycosis 03/04/2014  . Hyperglycemia 10/17/2013  . Unspecified hereditary and idiopathic peripheral neuropathy 10/17/2013  . 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 Erectile dysfunction 03/07/2012  . History of CVA (cerebrovascular accident) 12/28/2010  . Hypercholesteremia 12/28/2010  . Hypertension 12/28/2010  . Allergic rhinitis 09/09/2010  . MIGRAINE HEADACHE 01/11/2010  . Tobacco use disorder 09/10/2009  . LOW BACK PAIN 11/27/2008  . COLONIC POLYPS, ADENOMATOUS, HX OF 02/24/2008  . ESOPHAGEAL REFLUX 02/07/2008  . NECK PAIN 11/15/2007  . RENAL CALCULUS, RIGHT 04/08/2007  . COPD, moderate ([place]) 08/13/2006  . INSOMNIA NOS 07/26/2006     Social Hx:  reports that he has been smoking Cigarettes.  He started  smoking about 47 years ago. He has a 45.00 pack-year smoking history. He has never used smokeless tobacco.    Objective:   BP (!) 135/92   Pulse 98   Temp 97.8 F (36.6 C) (Oral)   Wt 166 lb 3.2 oz (75.4 kg)   BMI 26.03 kg/m  Physical Exam  Constitutional: He is oriented to person, place, and time. He appears well-developed and well-nourished. No distress.  HENT:  Head: Normocephalic.  Right Ear: External ear normal.  Left Ear: External ear normal.  Mouth/Throat: Oropharynx is clear and moist.  Unable to visualize the tympanic membranes due to cerumen impaction bilaterally  Neurological: He is alert and oriented to person, place, and time. He displays a negative Romberg sign.  Normal gait    Assessment & Plan:  Decreased hearing of right ear Decreased hearing in right ear. Bilateral ears with impacted cerumen likely cause of decreased hearing. Bilateral ear lavage was given today. Patient instructed to return to clinic if his hearing has not improved or he has any balance abnormalities.   Smitty Cords, MD [facility], PGY-2

## 2016-02-16 NOTE — Assessment & Plan Note (Signed)
Decreased hearing in right ear. Bilateral ears with impacted cerumen likely cause of decreased hearing. Bilateral ear lavage was given today. Patient instructed to return to clinic if his hearing has not improved or he has any balance abnormalities.

## 2016-02-28 ENCOUNTER — Other Ambulatory Visit: Payer: Self-pay | Admitting: Family Medicine

## 2016-03-01 DIAGNOSIS — Z23 Encounter for immunization: Secondary | ICD-10-CM | POA: Diagnosis not present

## 2016-03-21 DIAGNOSIS — N3941 Urge incontinence: Secondary | ICD-10-CM | POA: Diagnosis not present

## 2016-03-21 DIAGNOSIS — Z125 Encounter for screening for malignant neoplasm of prostate: Secondary | ICD-10-CM | POA: Diagnosis not present

## 2016-03-21 DIAGNOSIS — N3281 Overactive bladder: Secondary | ICD-10-CM | POA: Diagnosis not present

## 2016-03-22 ENCOUNTER — Other Ambulatory Visit: Payer: Self-pay | Admitting: Family Medicine

## 2016-03-22 DIAGNOSIS — I639 Cerebral infarction, unspecified: Secondary | ICD-10-CM

## 2016-05-10 ENCOUNTER — Ambulatory Visit (HOSPITAL_COMMUNITY)
Admission: RE | Admit: 2016-05-10 | Discharge: 2016-05-10 | Disposition: A | Discharge status: Home / Self Care | Payer: Medicare Other | Source: Ambulatory Visit | Attending: Family Medicine | Admitting: Family Medicine

## 2016-05-10 ENCOUNTER — Ambulatory Visit (INDEPENDENT_AMBULATORY_CARE_PROVIDER_SITE_OTHER): Payer: Medicare Other | Admitting: Family Medicine

## 2016-05-10 ENCOUNTER — Other Ambulatory Visit: Payer: Self-pay

## 2016-05-10 ENCOUNTER — Encounter: Payer: Self-pay | Admitting: Family Medicine

## 2016-05-10 DIAGNOSIS — M7022 Olecranon bursitis, left elbow: Secondary | ICD-10-CM | POA: Diagnosis not present

## 2016-05-10 DIAGNOSIS — J449 Chronic obstructive pulmonary disease, unspecified: Secondary | ICD-10-CM

## 2016-05-10 DIAGNOSIS — I10 Essential (primary) hypertension: Secondary | ICD-10-CM

## 2016-05-10 DIAGNOSIS — I639 Cerebral infarction, unspecified: Secondary | ICD-10-CM

## 2016-05-10 DIAGNOSIS — R079 Chest pain, unspecified: Secondary | ICD-10-CM | POA: Insufficient documentation

## 2016-05-10 DIAGNOSIS — R0789 Other chest pain: Secondary | ICD-10-CM

## 2016-05-10 DIAGNOSIS — B351 Tinea unguium: Secondary | ICD-10-CM | POA: Diagnosis present

## 2016-05-10 DIAGNOSIS — R9431 Abnormal electrocardiogram [ECG] [EKG]: Secondary | ICD-10-CM | POA: Insufficient documentation

## 2016-05-10 DIAGNOSIS — Z720 Tobacco use: Secondary | ICD-10-CM | POA: Diagnosis not present

## 2016-05-10 DIAGNOSIS — Z23 Encounter for immunization: Secondary | ICD-10-CM | POA: Diagnosis not present

## 2016-05-10 DIAGNOSIS — F17200 Nicotine dependence, unspecified, uncomplicated: Secondary | ICD-10-CM

## 2016-05-10 MED ORDER — HYDROCHLOROTHIAZIDE 12.5 MG PO TABS: 12.5000 mg | ORAL_TABLET | Freq: Every day | ORAL | 3 refills | Status: DC

## 2016-05-10 MED ORDER — TIOTROPIUM BROMIDE MONOHYDRATE 18 MCG IN CAPS: 18.0000 ug | ORAL_CAPSULE | Freq: Every day | RESPIRATORY_TRACT | 12 refills | Status: DC

## 2016-05-10 MED ORDER — ALBUTEROL SULFATE HFA 108 (90 BASE) MCG/ACT IN AERS: 2.0000 | INHALATION_SPRAY | Freq: Four times a day (QID) | RESPIRATORY_TRACT | 0 refills | Status: DC | PRN

## 2016-05-10 NOTE — Assessment & Plan Note (Addendum)
Poor control.  Needs to be on controler, which I will switch from inhaled steroid to LAMA.  I believe COPD is causing most/all of his symptoms.

## 2016-05-10 NOTE — Progress Notes (Signed)
   Subjective:    Patient ID: [name], male    DOB: 04-Sep-1955, 60 y.o.   MRN: [id number]  HPI Two issues, one important. 1. C/O DOE x 1 year, including chest tightness.  "My chest and heart hurts."  Slowly worsening.  Feels it when he walks four miles to the bus stop.  Sometimes must stop.  Feels wheezing at the same time.  Hx of COPD - not using his flovent - out.  Still smoking.  Has chronic, non productive cough.  No hx of CAD or of CHF.  Denies ankle swelling.  Has been slowly getting worse.  No abrupt change. No current chest pain 2. Left elbow pain and swelling.  No fever.  Day to day trauma, no notable single trauma.   Other issues. BP up.  Not on any BP meds. Med confusion.  Did not bring in meds.  He believes he is on an aspirin and another blood thinner.  No inhalors.      Review of Systems     Objective:   Physical Exam BP repeated and still high. Lungs scattered rhonchi. No wheeze.  Mildly prolonged exp phase. Cardiac RRR without m or g Abd benign Ext no edema  Review of EKG: No acute changes.        Assessment & Plan:

## 2016-05-10 NOTE — Assessment & Plan Note (Signed)
Observe and avoid trauma.

## 2016-05-10 NOTE — Patient Instructions (Signed)
The spriva inhaler you use every day to control the COPD The albuterol inhaler you use only when you are wheezing. I sent in a new BP medication for you since your blood pressure was high. See me in 2-4 weeks for a recheck.  Please bring in all your medications when you come.

## 2016-05-10 NOTE — Assessment & Plan Note (Signed)
Needs to be on meds.

## 2016-05-10 NOTE — Assessment & Plan Note (Signed)
Patient states he has no plans to quit and is fully aware of the likely consequences.

## 2016-05-10 NOTE — Assessment & Plan Note (Signed)
Likely the chest tightness on exertion is his COPD.  Of course, long diff for chest pain.  Know previous lung cancer puts him at risk for recurrence and for DVT.  Previous CVA places him at risk for CAD.   His ongoing tobacco abuse is a confounding symptom.

## 2016-05-11 ENCOUNTER — Telehealth: Payer: Self-pay | Admitting: *Deleted

## 2016-05-11 ENCOUNTER — Other Ambulatory Visit: Payer: Self-pay | Admitting: Family Medicine

## 2016-05-11 NOTE — Telephone Encounter (Signed)
Prior Authorization received from [facility] for [employer]. PA completed online at [email or web address]; PA approved vaild 02/11/16-05/11/2017. Derl Barrow, RN

## 2016-05-31 ENCOUNTER — Ambulatory Visit (INDEPENDENT_AMBULATORY_CARE_PROVIDER_SITE_OTHER): Payer: Medicare Other | Admitting: Family Medicine

## 2016-05-31 DIAGNOSIS — M7022 Olecranon bursitis, left elbow: Secondary | ICD-10-CM

## 2016-05-31 DIAGNOSIS — Z23 Encounter for immunization: Secondary | ICD-10-CM | POA: Diagnosis not present

## 2016-05-31 DIAGNOSIS — I10 Essential (primary) hypertension: Secondary | ICD-10-CM

## 2016-05-31 DIAGNOSIS — F17200 Nicotine dependence, unspecified, uncomplicated: Secondary | ICD-10-CM

## 2016-05-31 DIAGNOSIS — B351 Tinea unguium: Secondary | ICD-10-CM | POA: Diagnosis present

## 2016-05-31 DIAGNOSIS — J449 Chronic obstructive pulmonary disease, unspecified: Secondary | ICD-10-CM | POA: Diagnosis not present

## 2016-05-31 DIAGNOSIS — Z720 Tobacco use: Secondary | ICD-10-CM | POA: Diagnosis not present

## 2016-05-31 NOTE — Patient Instructions (Signed)
Stay on all your same medications. Your blood pressure is great. See me in 6 months if things are going well. You really need to quit smoking.   I hope the injection helps your elbow.

## 2016-06-02 ENCOUNTER — Encounter: Payer: Self-pay | Admitting: Family Medicine

## 2016-06-02 NOTE — Assessment & Plan Note (Signed)
Good control

## 2016-06-02 NOTE — Assessment & Plan Note (Signed)
Once again counseled on cessation.

## 2016-06-02 NOTE — Assessment & Plan Note (Signed)
Drained and injected.

## 2016-06-02 NOTE — Progress Notes (Signed)
   Subjective:    Patient ID: [name], male    DOB: 12/19/1955, 61 y.o.   MRN: [id number]  HPI Two issues: Recheck BP.  Started on HCTZ and tolerating well.  BP improved.  Home BPs are at goal. No concerns. Still with non infected left olecronon bursitis.  Wants drained. Still smoking.  In remission from lung cancer.  Vital to quit.    Review of Systems     Objective:   Physical ExamLungs clear Left elbow, Swelling without erythema or warmth of left olecronono bursa.  Informed consent.  Drained of 8 cc of bloody fluid with 18 gauge needle.  Same needle injected 40 mg depo medrol and 2 cc xylocaine.  Patient tolerated procedure well.        Assessment & Plan:

## 2016-06-07 ENCOUNTER — Encounter (HOSPITAL_COMMUNITY): Payer: Self-pay | Admitting: Emergency Medicine

## 2016-06-07 ENCOUNTER — Ambulatory Visit (HOSPITAL_COMMUNITY)
Admission: EM | Admit: 2016-06-07 | Discharge: 2016-06-07 | Disposition: A | Discharge status: Home / Self Care | Payer: Medicare Other | Attending: Family Medicine | Admitting: Family Medicine

## 2016-06-07 DIAGNOSIS — J40 Bronchitis, not specified as acute or chronic: Secondary | ICD-10-CM

## 2016-06-07 DIAGNOSIS — R0602 Shortness of breath: Secondary | ICD-10-CM | POA: Diagnosis not present

## 2016-06-07 DIAGNOSIS — J441 Chronic obstructive pulmonary disease with (acute) exacerbation: Secondary | ICD-10-CM

## 2016-06-07 DIAGNOSIS — Z720 Tobacco use: Secondary | ICD-10-CM

## 2016-06-07 DIAGNOSIS — R059 Cough, unspecified: Secondary | ICD-10-CM

## 2016-06-07 DIAGNOSIS — R05 Cough: Secondary | ICD-10-CM | POA: Diagnosis not present

## 2016-06-07 MED ORDER — BENZONATATE 100 MG PO CAPS: 200.0000 mg | ORAL_CAPSULE | Freq: Three times a day (TID) | ORAL | 0 refills | Status: DC | PRN

## 2016-06-07 MED ORDER — ALBUTEROL SULFATE (2.5 MG/3ML) 0.083% IN NEBU
INHALATION_SOLUTION | RESPIRATORY_TRACT | Status: AC
  Filled 2016-06-07: qty 3

## 2016-06-07 MED ORDER — METHYLPREDNISOLONE SODIUM SUCC 125 MG IJ SOLR
INTRAMUSCULAR | Status: AC
  Filled 2016-06-07: qty 2

## 2016-06-07 MED ORDER — METHYLPREDNISOLONE SODIUM SUCC 125 MG IJ SOLR
125.0000 mg | Freq: Once | INTRAMUSCULAR | Status: AC
  Administered 2016-06-07: 125 mg via INTRAMUSCULAR

## 2016-06-07 MED ORDER — PREDNISONE 10 MG (21) PO TBPK: 10.0000 mg | ORAL_TABLET | Freq: Every day | ORAL | 0 refills | Status: DC

## 2016-06-07 MED ORDER — ALBUTEROL SULFATE (2.5 MG/3ML) 0.083% IN NEBU
2.5000 mg | INHALATION_SOLUTION | Freq: Once | RESPIRATORY_TRACT | Status: AC
  Administered 2016-06-07: 2.5 mg via RESPIRATORY_TRACT

## 2016-06-07 MED ORDER — SODIUM CHLORIDE 0.9 % IN NEBU
INHALATION_SOLUTION | RESPIRATORY_TRACT | Status: AC
  Filled 2016-06-07: qty 3

## 2016-06-07 MED ORDER — AMOXICILLIN 875 MG PO TABS: 875.0000 mg | ORAL_TABLET | Freq: Two times a day (BID) | ORAL | 0 refills | Status: DC
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
## 2016-06-07 NOTE — ED Triage Notes (Signed)
The patient presented to the [facility] with a complaint of a cough, chest congestion and soreness, and shortness of breath x 6 days.

## 2016-06-07 NOTE — ED Notes (Signed)
Patient is not ready for discharged-patient is having a breathing treatment in progress

## 2016-06-26 NOTE — Telephone Encounter (Signed)
Entered in error

## 2016-07-10 ENCOUNTER — Encounter: Payer: Self-pay | Admitting: Family Medicine

## 2016-07-10 ENCOUNTER — Ambulatory Visit (INDEPENDENT_AMBULATORY_CARE_PROVIDER_SITE_OTHER): Payer: Medicare Other | Admitting: Family Medicine

## 2016-07-10 ENCOUNTER — Ambulatory Visit
Admission: RE | Admit: 2016-07-10 | Discharge: 2016-07-10 | Disposition: A | Discharge status: Home / Self Care | Payer: Medicare Other | Source: Ambulatory Visit | Attending: Family Medicine | Admitting: Family Medicine

## 2016-07-10 VITALS — BP 110/85 | HR 115 | Temp 97.5°F | Ht 67.0 in | Wt 159.0 lb

## 2016-07-10 DIAGNOSIS — J449 Chronic obstructive pulmonary disease, unspecified: Secondary | ICD-10-CM | POA: Diagnosis not present

## 2016-07-10 DIAGNOSIS — J441 Chronic obstructive pulmonary disease with (acute) exacerbation: Secondary | ICD-10-CM

## 2016-07-10 DIAGNOSIS — B351 Tinea unguium: Secondary | ICD-10-CM | POA: Diagnosis present

## 2016-07-10 DIAGNOSIS — R0602 Shortness of breath: Secondary | ICD-10-CM | POA: Diagnosis not present

## 2016-07-10 DIAGNOSIS — Z23 Encounter for immunization: Secondary | ICD-10-CM | POA: Diagnosis not present

## 2016-07-10 DIAGNOSIS — Z720 Tobacco use: Secondary | ICD-10-CM | POA: Diagnosis not present

## 2016-07-10 MED ORDER — DOXYCYCLINE HYCLATE 100 MG PO TABS: 100.0000 mg | ORAL_TABLET | Freq: Two times a day (BID) | ORAL | 0 refills | Status: DC

## 2016-07-10 MED ORDER — ALBUTEROL SULFATE HFA 108 (90 BASE) MCG/ACT IN AERS: 2.0000 | INHALATION_SPRAY | Freq: Four times a day (QID) | RESPIRATORY_TRACT | 0 refills | Status: DC | PRN

## 2016-07-10 MED ORDER — PREDNISONE 20 MG PO TABS: 40.0000 mg | ORAL_TABLET | Freq: Every day | ORAL | 0 refills | Status: DC

## 2016-07-10 NOTE — Progress Notes (Signed)
   Subjective: CC: cough SFK:CLEXNT [place] is a 61 y.o. male presenting to clinic today for same day appointment. PCP: Zigmund Gottron, MD Concerns today include:  1. Cough Patient reports that he has had a productive cough for x1 month. Denies fevers, chills, nausea, vomiting.  Endorses productive cough w/ yellow phlegm.  He reports wheeze and SOB at rest and activity.  He is an active smoker, smokes 3/4 ppd Camels.  He is contemplative but has not been able to quit smoking.  He was [facility] at [facility] in 05/2016 w/ Amoxicillin and Prednisone taper.  He reports that symptoms had improved but returned last week.  No Known Allergies  Social Hx reviewed: active smoker. MedHx, current medications and allergies reviewed.  Please see EMR. ROS: Per HPI  Objective: Office vital signs reviewed. BP 110/85 (BP Location: Left Arm, Patient Position: Sitting, Cuff Size: Normal)   Pulse (!) 115   Temp 97.5 F (36.4 C) (Oral)   Ht '5\' 7"'$  (1.702 m)   Wt 159 lb (72.1 kg)   SpO2 92%   BMI 24.90 kg/m   Physical Examination:  General: Awake, alert, well nourished, nontoxic, No acute distress Cardio: regular rate on my exam and rhythm, S1S2 heard, no murmurs appreciated Neck: no JVD Pulm: globally decreased breath sounds but no wheezes, rhonchi or rales; normal work of breathing on room air MSK: no edema  Assessment/ Plan: 61 y.o. male   1. COPD exacerbation ([place]).  S/p treatment for COPD exacerbation in 05/2016.  He was treated with amoxicillin and steroid taper at that time.  Will prescribe Doxy and steroid burst.  Last EKG reviewed.  Of note, he has had about 6 lb weight loss compared to previous exams.  I am unsure if this is from clothing or if this is true weight loss.  CXR ordered to further evaluate.  Would keep pulmonary malignancy amongst DDx if persistent symptoms given continued Tobacco use. - albuterol (PROVENTIL HFA;VENTOLIN HFA) 108 (90 Base) MCG/ACT inhaler; Inhale 2 puffs into the  lungs every 6 (six) hours as needed for wheezing or shortness of breath. Only use with chest tightness.  Dispense: 1 Inhaler; Refill: 0 - predniSONE (DELTASONE) 20 MG tablet; Take 2 tablets (40 mg total) by mouth daily with breakfast. x5 days  Dispense: 10 tablet; Refill: 0 - doxycycline (VIBRA-TABS) 100 MG tablet; Take 1 tablet (100 mg total) by mouth 2 (two) times daily. x7days  Dispense: 14 tablet; Refill: 0 - DG Chest 2 View; Future - Return precautions reviewed - Follow up prn  Janora Norlander, DO PGY-3, [facility]

## 2016-07-10 NOTE — Patient Instructions (Signed)
Chronic Obstructive Pulmonary Disease Exacerbation Chronic obstructive pulmonary disease (COPD) is a common lung condition in which airflow from the lungs is limited. COPD is a general term that can be used to describe many different lung problems that limit airflow, including chronic bronchitis and emphysema. COPD exacerbations are episodes when breathing symptoms become much worse and require extra treatment. Without treatment, COPD exacerbations can be life threatening, and frequent COPD exacerbations can cause further damage to your lungs. What are the causes?  Respiratory infections.  Exposure to smoke.  Exposure to air pollution, chemical fumes, or dust. Sometimes there is no apparent cause or trigger. What increases the risk?  Smoking cigarettes.  Older age.  Frequent prior COPD exacerbations. What are the signs or symptoms?  Increased coughing.  Increased thick spit (sputum) production.  Increased wheezing.  Increased shortness of breath.  Rapid breathing.  Chest tightness. How is this diagnosed? Your medical history, a physical exam, and tests will help your health care provider make a diagnosis. Tests may include:  A chest X-ray.  Basic lab tests.  Sputum testing.  An arterial blood gas test.  How is this treated? Depending on the severity of your COPD exacerbation, you may need to be admitted to a hospital for treatment. Some of the treatments commonly used to treat COPD exacerbations are:  Antibiotic medicines.  Bronchodilators. These are drugs that expand the air passages. They may be given with an inhaler or nebulizer. Spacer devices may be needed to help improve drug delivery.  Corticosteroid medicines.  Supplemental oxygen therapy.  Airway clearing techniques, such as noninvasive ventilation (NIV) and positive expiratory pressure (PEP). These provide respiratory support through a mask or other noninvasive device.  Follow these instructions at  home:  Do not smoke. Quitting smoking is very important to prevent COPD from getting worse and exacerbations from happening as often.  Avoid exposure to all substances that irritate the airway, especially to tobacco smoke.  If you were prescribed an antibiotic medicine, finish it all even if you start to feel better.  Take all medicines as directed by your health care provider.It is important to use correct technique with inhaled medicines.  Drink enough fluids to keep your urine clear or pale yellow (unless you have a medical condition that requires fluid restriction).  Use a cool mist vaporizer. This makes it easier to clear your chest when you cough.  If you have a home nebulizer and oxygen, continue to use them as directed.  Maintain all necessary vaccinations to prevent infections.  Exercise regularly.  Eat a healthy diet.  Keep all follow-up appointments as directed by your health care provider. Get help right away if:  You have worsening shortness of breath.  You have trouble talking.  You have severe chest pain.  You have blood in your sputum.  You have a fever.  You have weakness, vomit repeatedly, or faint.  You feel confused.  You continue to get worse. This information is not intended to replace advice given to you by your health care provider. Make sure you discuss any questions you have with your health care provider. Document Released: 03/12/2007 Document Revised: 10/21/2015 Document Reviewed: 01/17/2013 Elsevier Interactive Patient Education  2017 Elsevier Inc.  

## 2016-07-19 ENCOUNTER — Encounter: Payer: Self-pay | Admitting: Family Medicine

## 2016-07-19 ENCOUNTER — Ambulatory Visit (INDEPENDENT_AMBULATORY_CARE_PROVIDER_SITE_OTHER): Payer: Medicare Other | Admitting: Family Medicine

## 2016-07-19 DIAGNOSIS — J449 Chronic obstructive pulmonary disease, unspecified: Secondary | ICD-10-CM

## 2016-07-19 DIAGNOSIS — F17200 Nicotine dependence, unspecified, uncomplicated: Secondary | ICD-10-CM | POA: Diagnosis not present

## 2016-07-19 DIAGNOSIS — B351 Tinea unguium: Secondary | ICD-10-CM | POA: Diagnosis present

## 2016-07-19 DIAGNOSIS — Z23 Encounter for immunization: Secondary | ICD-10-CM | POA: Diagnosis not present

## 2016-07-19 DIAGNOSIS — Z720 Tobacco use: Secondary | ICD-10-CM | POA: Diagnosis not present

## 2016-07-19 NOTE — Patient Instructions (Signed)
I want you to see our pharmacologist, Dr. Valentina Lucks.  Please make an appointment on the way out for him. I want him to do two things.   1. Talk to you about options to help quit smoking.  It is absolutely vital for you to quit. 2. Also, I want him to switch the spiriva that you currently take every day to another inhaler (LABA/LAMA combo.)

## 2016-07-20 ENCOUNTER — Ambulatory Visit (INDEPENDENT_AMBULATORY_CARE_PROVIDER_SITE_OTHER): Payer: Medicare Other | Admitting: Pharmacist

## 2016-07-20 ENCOUNTER — Encounter: Payer: Self-pay | Admitting: Pharmacist

## 2016-07-20 DIAGNOSIS — F17200 Nicotine dependence, unspecified, uncomplicated: Secondary | ICD-10-CM | POA: Diagnosis not present

## 2016-07-20 DIAGNOSIS — B351 Tinea unguium: Secondary | ICD-10-CM | POA: Diagnosis present

## 2016-07-20 DIAGNOSIS — J449 Chronic obstructive pulmonary disease, unspecified: Secondary | ICD-10-CM | POA: Diagnosis not present

## 2016-07-20 DIAGNOSIS — Z23 Encounter for immunization: Secondary | ICD-10-CM | POA: Diagnosis not present

## 2016-07-20 DIAGNOSIS — Z720 Tobacco use: Secondary | ICD-10-CM | POA: Diagnosis not present

## 2016-07-20 MED ORDER — NICOTINE 21 MG/24HR TD PT24: 21.0000 mg | MEDICATED_PATCH | Freq: Every day | TRANSDERMAL | 1 refills | Status: DC

## 2016-07-20 MED ORDER — BUPROPION HCL ER (XL) 150 MG PO TB24: 150.0000 mg | ORAL_TABLET | Freq: Every day | ORAL | 1 refills | Status: DC

## 2016-07-20 MED ORDER — UMECLIDINIUM-VILANTEROL 62.5-25 MCG/INH IN AEPB: 1.0000 | INHALATION_SPRAY | Freq: Every day | RESPIRATORY_TRACT | 3 refills | Status: DC

## 2016-07-20 MED ORDER — NICOTINE POLACRILEX 4 MG MT LOZG: 4.0000 mg | LOZENGE | OROMUCOSAL | 1 refills | Status: DC | PRN

## 2016-07-20 MED ORDER — UMECLIDINIUM-VILANTEROL 62.5-25 MCG/INH IN AEPB: 1.0000 | INHALATION_SPRAY | Freq: Every day | RESPIRATORY_TRACT | 0 refills | Status: DC

## 2016-07-20 NOTE — Assessment & Plan Note (Signed)
Main issue is ongoing tobacco abuse.  I think he would benefit from adding a LABA to his LAMA

## 2016-07-20 NOTE — Progress Notes (Signed)
   Subjective:    Patient ID: [name], male    DOB: 1955-08-08, 61 y.o.   MRN: [id number]  HPI  [name] is having more trouble with his COPD, post radiation pneumonitis, S/P lung cancer treatment.  Seen 07/10/16 with wheezing and Rxed with pred and doxy.  Some improvement.  Now off and still wheezing.  On [place].    Unfortunately, still smoking.  Actually restarted after the death of his brother and father last year.  He wants to quit but feels powerless.  "I have tried everything."  Finances are also an issue.  Good news is that he just obtained a car.  Bad news is that he has no money to afford meds at present.    Review of Systems     Objective:   Physical Exam  Coarse rhonchi and end exp wheeze.  No resp distress at rest.        Assessment & Plan:

## 2016-07-20 NOTE — Assessment & Plan Note (Signed)
COPD: Gold Class C with MRC>2 with 1 exacerbation.  Will stop Spiriva and transition to LABA/LAMA combo Anoro 1 puff daily.  Instructed patient on proper use and side effects.  Samples were provided.

## 2016-07-20 NOTE — Progress Notes (Signed)
   S:  Patient arrives in good spirits ambulating without assistance.   Patient arrives for evaluation/assistance with tobacco dependence and COPD.  Patient was referred on 07/19/16.  Patient was last seen by Primary Care Provider on 2/21 by Dr Andria Frames. Patient reports using albuterol 4-5 times per day and difficulty walking on the level.   Age when started using tobacco on a daily basis 63. Number of Cigarettes per day 15-20. Brand smoked Camel 100s Filtered.  Smokes first cigarette 5 minutes after waking. Denies waking to smoke due to alarm system and having to smoke outside.     Most recent quit attempt in 2006 when started chemo/radiation for lung cancer.   Longest time ever been tobacco free 1 year.  Quit smoking this time by using smoking cessation classes at [facility].   Medications (NRT, bupropion, varenicline) used in prior in past cessation efforts include: patches, Chantix (nausea), bupropion (kept smoking), Chantix + Patches in combination.    Rates IMPORTANCE of quitting tobacco on 1-10 scale of 10. Rates CONFIDENCE of quitting tobacco on 1-10 scale of 0 without our assistance. Is in a Narcotic Anonymous program but since starting the program many of his family members have passed away.   Rates CONFIDENCE of quitting tobacco with assistance of pharmacotherapy is 5 out of 10.    Most common triggers to use tobacco include: coffee, stress (most of family has died), after eating.    Motivation to quit: Wants to live longer.   On Myrbetriq because he has had trouble urinating "too" much.    A/P: Severe Nicotine Dependence of 46 years duration in a patient who is fair candidate for success b/c of importance of quitting tobacco but lack of confidence in quitting.  Initiated nicotine 21 mg patches daily and nicotine 4 mg lozenges daily. Patient counseled on purpose, proper use, and potential adverse effects.Initiated bupropion XL 150 mg daily. Denies history of seizures. Patient  counseled on purpose, proper use, and potential adverse effects, including insomnia, and potential change in mood. He was unable to pick a quit date today but has agreed to decrease cigarettes after he picks up his medications next week when he receives his check.  COPD: Gold Class C with MRC>2 with 1 exacerbation.  Will stop Spiriva and transition to LABA/LAMA combo Anoro 1 puff daily.  Instructed patient on proper use and side effects.  Samples were provided.    Written information provided. F/U Rx Clinic Visit in 2 weeks.  Total time in face-to-face counseling 35 minutes.  Patient seen with Maylon Cos, PharmD PGY1 Resident. and Bennye Alm, PharmD PGY2 Resident.

## 2016-07-20 NOTE — Assessment & Plan Note (Signed)
Severe Nicotine Dependence of 46 years duration in a patient who is fair candidate for success b/c of importance of quitting tobacco but lack of confidence in quitting.  Initiated nicotine 21 mg patches daily and nicotine 4 mg lozenges daily. Patient counseled on purpose, proper use, and potential adverse effects.Initiated bupropion XL 150 mg daily. Denies history of seizures. Patient counseled on purpose, proper use, and potential adverse effects, including insomnia, and potential change in mood. He was unable to pick a quit date today but has agreed to decrease cigarettes after he picks up his medications next week when he receives his check.

## 2016-07-20 NOTE — Patient Instructions (Signed)
Stop Spiriva Start Anoro 1 puff once daily.  Continue albuterol as needed  Plan to pick up the following meds after you receive your check.  Start Bupropion XL 150 mg daily Start Nicotine patches 21 mg daily Start Nicotine lozenges 4 mg as needed  Continue to cut down on cigarettes.  Followup with Dr Valentina Lucks in 2 weeks

## 2016-07-20 NOTE — Assessment & Plan Note (Signed)
FU Dr. Valentina Lucks to discuss affordable options.

## 2016-07-24 NOTE — Progress Notes (Signed)
Patient ID: [name], male   DOB: 08/03/1955, 61 y.o.   MRN: [id number] Reviewed: Agree with Dr. Graylin Shiver documentation and management.

## 2016-08-03 ENCOUNTER — Encounter: Payer: Self-pay | Admitting: Pharmacist

## 2016-08-03 ENCOUNTER — Ambulatory Visit (INDEPENDENT_AMBULATORY_CARE_PROVIDER_SITE_OTHER): Payer: Medicare Other | Admitting: Pharmacist

## 2016-08-03 DIAGNOSIS — Z23 Encounter for immunization: Secondary | ICD-10-CM | POA: Diagnosis not present

## 2016-08-03 DIAGNOSIS — F17200 Nicotine dependence, unspecified, uncomplicated: Secondary | ICD-10-CM | POA: Diagnosis not present

## 2016-08-03 DIAGNOSIS — J441 Chronic obstructive pulmonary disease with (acute) exacerbation: Secondary | ICD-10-CM | POA: Diagnosis not present

## 2016-08-03 DIAGNOSIS — Z720 Tobacco use: Secondary | ICD-10-CM | POA: Diagnosis not present

## 2016-08-03 DIAGNOSIS — J449 Chronic obstructive pulmonary disease, unspecified: Secondary | ICD-10-CM | POA: Diagnosis not present

## 2016-08-03 DIAGNOSIS — B351 Tinea unguium: Secondary | ICD-10-CM | POA: Diagnosis present

## 2016-08-03 MED ORDER — ALBUTEROL SULFATE HFA 108 (90 BASE) MCG/ACT IN AERS: 2.0000 | INHALATION_SPRAY | Freq: Four times a day (QID) | RESPIRATORY_TRACT | 1 refills | Status: DC | PRN

## 2016-08-03 NOTE — Assessment & Plan Note (Signed)
Severe Nicotine Dependence of 46 years duration in a patient who is fair candidate for success b/c of previous success with cessation of alcohol and cocaine as well as strong support system in reading his Bible and using [place] [facility]. Continued nicotine replacement tx of nicotine transdermal 21 mg/24 hr patch daily. Patient counseled on purpose, proper use, and potential adverse effects.  Assisted patient with calling [place] [facility]. They are sending him an 2 week supply of 21 mg nicotine patches that will arrive in the mail within 2 weeks. Continued bupropion XL 150 mg QD.  Written information provided. Provided information and encouraged to call 1 800-QUIT NOW support program.  Patient has F/U phone call in 1 week with [place] [facility].  F/U Rx Clinic Visit and PFTs in 3 weeks. Total time in face-to-face counseling 60 minutes.

## 2016-08-03 NOTE — Patient Instructions (Addendum)
Thank you for coming in today  1 800-QUIT NOW  Continue to make small goals such as not smoking for the next 2 hours.   Think about your triggers and try to avoid one specifically. Try staying at [facility] while you drink coffee instead of going home or try arriving to group therapy on time instead of early.   Continue reading your Bible to keep your mind busy. Continue with [facility] [employer] - they will call you periodically to check in and will mail you support items.   You're doing great, keep up the awesome work and motivation!  Followup With Dr Valentina Lucks in 3 weeks

## 2016-08-03 NOTE — Progress Notes (Signed)
   S:  Patient arrives slightly agitated because he has not had a cigarette today. Patient arrives for evaluation/assistance with tobacco dependence. Patient was referred on 07/20/16.  Patient was last seen by Primary Care Provider on 07/20/16.  States he has some shakiness/anxiety because of quitting tobacco.  Has decreased cigarettes to 1 cigarette yesterday and has not yet had one today.  He has been chewing straws to assist as well as trying to read his bible to help with the cravings. He plans to quit within the next 30 days.   Current Smoking Cessation Medications: Nicotine 21 mg patches via previous supply from [facility].  Patches and Gum were unable to be filled by his pharmacy.   Age when started using tobacco on a daily basis 69. Number of Cigarettes per day 10-20. Brand smoked Marlboro, Kools.  Smokes first cigarette 5 minutes after waking.  Most recent quit attempt in 2006 when started chemo/radiation for lung cancer.   Longest time ever been tobacco free 1 year.  Quit smoking this time by using smoking cessation classes at [facility].   Rates IMPORTANCE of quitting tobacco on 1-10 scale of 10. Rates CONFIDENCE of quitting tobacco on 1-10 scale of 0 for the rest of his life.  Most common triggers to use tobacco include coffee at [employer], and stress from thinking about deaths in family   Motivation to quit: Worsening of COPD - cough   A/P: Severe Nicotine Dependence of 46 years duration in a patient who is fair candidate for success b/c of previous success with cessation of alcohol and cocaine as well as strong support system in reading his Bible and using [place] [facility]. Continued nicotine replacement tx of nicotine transdermal 21 mg/24 hr patch daily. Patient counseled on purpose, proper use, and potential adverse effects.  Assisted patient with calling [place] [facility]. They are sending him an 2 week supply of 21 mg nicotine patches that will arrive in the mail within 2 weeks. Continued  bupropion XL 150 mg QD.  Written information provided. Provided information and encouraged to call 1 800-QUIT NOW support program.  Patient has F/U phone call in 1 week with [place] [facility].  F/U Rx Clinic Visit and PFTs in 3 weeks. Total time in face-to-face counseling 60 minutes.    Patient seen with Myrna Blazer, PharmD Candidate, Ida Rogue, PharmD Candidate,. and Bennye Alm, PharmD, BCPS, PGY2 Resident.

## 2016-08-03 NOTE — Progress Notes (Signed)
Patient ID: [name], male   DOB: [birth year], 61 y.o.   MRN: [id number] Reviewed: Agree with Dr. Graylin Shiver documentation and management.

## 2016-08-10 ENCOUNTER — Telehealth: Payer: Self-pay | Admitting: Family Medicine

## 2016-08-10 NOTE — Telephone Encounter (Signed)
Pt wants to know if he can double up on a medication that Dr. Valentina Lucks recently gave him. I had a hard understanding pt, so unfortunately  I couldn't make out which medication he was referring to. ep

## 2016-08-14 NOTE — Telephone Encounter (Signed)
Clarified next follow up on 3/29 at 1:30  Still smoking 3 cigs per day today.  Encouraged continued tapering off / quitting  cigarettes.

## 2016-08-15 ENCOUNTER — Ambulatory Visit (INDEPENDENT_AMBULATORY_CARE_PROVIDER_SITE_OTHER): Payer: Medicare Other | Admitting: Cardiovascular Disease

## 2016-08-15 ENCOUNTER — Telehealth: Payer: Self-pay | Admitting: Family Medicine

## 2016-08-15 ENCOUNTER — Encounter: Payer: Self-pay | Admitting: Cardiovascular Disease

## 2016-08-15 VITALS — BP 164/88 | HR 84 | Ht 67.0 in | Wt 160.0 lb

## 2016-08-15 DIAGNOSIS — I739 Peripheral vascular disease, unspecified: Secondary | ICD-10-CM

## 2016-08-15 DIAGNOSIS — F17200 Nicotine dependence, unspecified, uncomplicated: Secondary | ICD-10-CM

## 2016-08-15 DIAGNOSIS — R0989 Other specified symptoms and signs involving the circulatory and respiratory systems: Secondary | ICD-10-CM

## 2016-08-15 DIAGNOSIS — E7800 Pure hypercholesterolemia, unspecified: Secondary | ICD-10-CM

## 2016-08-15 MED ORDER — LISINOPRIL 5 MG PO TABS: 5.0000 mg | ORAL_TABLET | Freq: Every day | ORAL | 3 refills | Status: DC

## 2016-08-15 NOTE — Assessment & Plan Note (Signed)
History of peripheral arterial disease status post left SFA directional atherectomy and drug-eluting balloon into plasty for a CTO 07/19/15. His most recent Dopplers performed 07/19/15 reveal an increase in his left ABI from 0.77 up to 0.99. His claudication has resolved.

## 2016-08-15 NOTE — Telephone Encounter (Signed)
Called and LM that yes, OK to take two 75 mg pills.  Invited to call back if further questions.

## 2016-08-15 NOTE — Addendum Note (Signed)
Addended by: Therisa Doyne on: 08/15/2016 11:26 AM   Modules accepted: Orders

## 2016-08-15 NOTE — Telephone Encounter (Signed)
Patient wanted to know if he could take two 75 mg of the Buproprion XL (which would be same as his most recent script for the 150 mg XL?).  Please let him know, thanks.

## 2016-08-15 NOTE — Assessment & Plan Note (Signed)
History of tobacco abuse currently smoking three quarters a pack a day recalcitrant respect to modification. He is taking bupropion for his PCP

## 2016-08-15 NOTE — Assessment & Plan Note (Signed)
History of hypertension blood pressure measured 164/88. He is on Hydrocort thiazide. Done at lisinopril 5 mg a day. We will check a basic metabolic panel in 3 weeks and he will see [name] back in 4 weeks for blood pressure and lab check.

## 2016-08-15 NOTE — Patient Instructions (Signed)
Medication Instructions: START Lisinopril 5 mg daily.   Labwork: Your physician recommends that you return for lab work in: 3 weeks   Testing/Procedures: Your physician has requested that you have a carotid duplex. This test is an ultrasound of the carotid arteries in your neck. It looks at blood flow through these arteries that supply the brain with blood. Allow one hour for this exam. There are no restrictions or special instructions.  Your physician has requested that you have a lower extremity arterial duplex. During this test, ultrasound is used to evaluate arterial blood flow in the legs. Allow one hour for this exam. There are no restrictions or special instructions.  Your physician has requested that you have an ankle brachial index (ABI). During this test an ultrasound and blood pressure cuff are used to evaluate the arteries that supply the arms and legs with blood. Allow thirty minutes for this exam. There are no restrictions or special instructions.  Follow-Up: Your physician recommends that you schedule a follow-up appointment in: 1 month with [facility] for BP Check. Your physician has requested that you regularly monitor and record your blood pressure readings at home. Please use the same machine at the same time of day to check your readings and record them to bring to your follow-up visit.  Your physician wants you to follow-up in: 1 year with Dr. Gwenlyn Found. You will receive a reminder letter in the mail two months in advance. If you don't receive a letter, please call our office to schedule the follow-up appointment.  If you need a refill on your cardiac medications before your next appointment, please call your pharmacy.

## 2016-08-15 NOTE — Progress Notes (Signed)
08/15/2016 [name]   28-Jan-1956  [id number]  Primary Physician Zigmund Gottron, MD Primary Cardiologist: Lorretta Harp MD Renae Gloss  HPI:  [name] is a 61 year old widowed Caucasian male father of 2 children who is disabled because of lung cancer. He was referred by Dr. Andria Frames for peripheral vascular evaluation because of left lower extremity claudication. I last saw him in the office 08/06/15.History of factors include treated hyperlipidemia as well as 50-pack-years of tobacco abuse currently smoking one pack per day. He does have lung cancer and has been treated by Dr. Earlie Server. He's had 3 strokes in the past. His brother died of a myocardial infarction at age 62 last year. He had Dopplers in office performed 06/25/15 which revealed a left ABI 0.78 with an occluded left SFA. Does complain of lifestyle limiting left lower extremity claudication. He has had a CT scan of his chest that shows coronary calcification but has never had documented coronary artery disease.  I performed angiography on him 07/19/15 revealing a short segment chronic total occlusion mid left SFA with two-vessel runoff. I performed [facility] 1 directional atherectomy followed by drug-eluting balloon angioplasty with excellent angiographic and clinical result. His left ABI improved from 0.70 to. .99 and his claudication improved as well. Since I saw him a year ago he continues to deny claudication, chest pain or shortness of breath. He also continues to smoke three quarters a pack a day cigarettes.    Current Outpatient Prescriptions  Medication Sig Dispense Refill  . albuterol (PROVENTIL HFA;VENTOLIN HFA) 108 (90 Base) MCG/ACT inhaler Inhale 2 puffs into the lungs every 6 (six) hours as needed for wheezing or shortness of breath. Only use with chest tightness. 1 Inhaler 1  . aspirin EC 81 MG tablet Take 81 mg by mouth daily.    Marland Kitchen atorvastatin (LIPITOR) 40 MG tablet take 1 tablet by mouth  daily 90 tablet 3  . buPROPion (WELLBUTRIN XL) 150 MG 24 hr tablet Take 1 tablet (150 mg total) by mouth daily. 30 tablet 1  . gabapentin (NEURONTIN) 300 MG capsule TAKE 1 CAPSULE BY MOUTH AT BEDTIME 90 capsule 12  . hydrochlorothiazide (HYDRODIURIL) 12.5 MG tablet Take 1 tablet (12.5 mg total) by mouth daily. 90 tablet 3  . ibuprofen (ADVIL,MOTRIN) 800 MG tablet take 1 tablet by mouth every 8 hours if needed pain 90 tablet 12  . nicotine (NICODERM CQ - DOSED IN MG/24 HOURS) 21 mg/24hr patch Place 1 patch (21 mg total) onto the skin daily. May use formulary preferred patch. 28 patch 1  . nicotine polacrilex (COMMIT) 4 MG lozenge Take 1 lozenge (4 mg total) by mouth as needed for smoking cessation. May use formulary alternative 100 tablet 1  . omeprazole (PRILOSEC) 20 MG capsule Take 40 mg by mouth daily.  0  . umeclidinium-vilanterol (ANORO ELLIPTA) 62.5-25 MCG/INH AEPB Inhale 1 puff into the lungs daily. 30 each 3   No current facility-administered medications for this visit.     No Known Allergies  Social History   Social History  . Marital status: Divorced    Spouse name: N/A  . Number of children: N/A  . Years of education: N/A   Occupational History  . Not on file.   Social History Main Topics  . Smoking status: Current Every Day Smoker    Packs/day: 1.00    Years: 45.00    Types: Cigarettes    Start date: 05/29/1968  . Smokeless tobacco: Never Used  Comment: cutting back  . Alcohol use No     Comment: past per patient none since 2005  . 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 Sexual activity: Not Currently    Partners: Female   Other Topics Concern  . Not on file   Social History Narrative   Diabled   Single   2 sons     Review of Systems: General: negative for chills, fever, night sweats or weight changes.  Cardiovascular: negative for chest pain, dyspnea on exertion, edema, orthopnea, palpitations, paroxysmal nocturnal dyspnea or shortness of  breath Dermatological: negative for rash Respiratory: negative for cough or wheezing Urologic: negative for hematuria Abdominal: negative for nausea, vomiting, diarrhea, bright red blood per rectum, melena, or hematemesis Neurologic: negative for visual changes, syncope, or dizziness All other systems reviewed and are otherwise negative except as noted above.    Blood pressure (!) 164/88, pulse 84, height '5\' 7"'$  (1.702 m), weight 160 lb (72.6 kg).  General appearance: alert and no distress Neck: no adenopathy, no JVD, supple, symmetrical, trachea midline, thyroid not enlarged, symmetric, no tenderness/mass/nodules and Soft bilateral carotid bruits Lungs: clear to auscultation bilaterally Heart: regular rate and rhythm, S1, S2 normal, no murmur, click, rub or gallop Extremities: extremities normal, atraumatic, no cyanosis or edema  EKG normal sinus rhythm at 84 without ST or T-wave changes. I personally reviewed this EKG  ASSESSMENT AND PLAN:   Tobacco use disorder History of tobacco abuse currently smoking three quarters a pack a day recalcitrant respect to modification. He is taking bupropion for his PCP  Hypercholesteremia History of hyperlipidemia on atorvastatin. We will recheck a lipid and liver profile  Hypertension History of hypertension blood pressure measured 164/88. He is on Hydrocort thiazide. Done at lisinopril 5 mg a day. We will check a basic metabolic panel in 3 weeks and he will see [name] back in 4 weeks for blood pressure and lab check.  Peripheral arterial disease (HCC) History of peripheral arterial disease status post left SFA directional atherectomy and drug-eluting balloon into plasty for a CTO 07/19/15. His most recent Dopplers performed 07/19/15 reveal an increase in his left ABI from 0.77 up to 0.99. His claudication has resolved.      Lorretta Harp MD FACP,FACC,FAHA, [facility] 08/15/2016 11:14 AM

## 2016-08-15 NOTE — Assessment & Plan Note (Signed)
History of hyperlipidemia on atorvastatin. We will recheck a lipid and liver profile

## 2016-08-16 NOTE — Addendum Note (Signed)
Addended by: Zebedee Iba on: 08/16/2016 01:57 PM   Modules accepted: Orders

## 2016-08-21 ENCOUNTER — Telehealth: Payer: Self-pay | Admitting: Cardiovascular Disease

## 2016-08-21 DIAGNOSIS — E7800 Pure hypercholesterolemia, unspecified: Secondary | ICD-10-CM | POA: Diagnosis not present

## 2016-08-21 NOTE — Telephone Encounter (Signed)
Returned call, Les aware to run all ordered tests, voiced thanks for call.

## 2016-08-21 NOTE — Telephone Encounter (Signed)
New message   Les from [facility] is calling. She says pt came in with orders for a BMP and Lipid and when she looked in the computer there was an order for a liver. He only had the orders for bmp and lipid, so she is asking if the liver is for a later date or if she should do it.

## 2016-08-22 LAB — HEPATIC FUNCTION PANEL
ALBUMIN: 4.1 g/dL (ref 3.6–5.1)
ALK PHOS: 90 U/L (ref 40–115)
ALT: 17 U/L (ref 9–46)
AST: 13 U/L (ref 10–35)
BILIRUBIN INDIRECT: 0.1 mg/dL — AB (ref 0.2–1.2)
BILIRUBIN TOTAL: 0.2 mg/dL (ref 0.2–1.2)
Bilirubin, Direct: 0.1 mg/dL (ref <=0.2)
TOTAL PROTEIN: 7.2 g/dL (ref 6.1–8.1)

## 2016-08-22 LAB — LIPID PANEL
CHOLESTEROL: 147 mg/dL (ref <200)
HDL: 32 mg/dL — AB (ref >40)
LDL Cholesterol: 75 mg/dL (ref <100)
TRIGLYCERIDES: 199 mg/dL — AB (ref <150)
Total CHOL/HDL Ratio: 4.6 Ratio (ref <5.0)
VLDL: 40 mg/dL — ABNORMAL HIGH (ref <30)

## 2016-08-22 LAB — BASIC METABOLIC PANEL WITH GFR
BUN: 15 mg/dL (ref 7–25)
CHLORIDE: 101 mmol/L (ref 98–110)
CO2: 24 mmol/L (ref 20–31)
Calcium: 9.4 mg/dL (ref 8.6–10.3)
Creat: 1.06 mg/dL (ref 0.70–1.25)
GFR, Est African American: 87 mL/min (ref >=60)
GFR, Est Non African American: 75 mL/min (ref >=60)
GLUCOSE: 156 mg/dL — AB (ref 65–99)
POTASSIUM: 4.8 mmol/L (ref 3.5–5.3)
Sodium: 136 mmol/L (ref 135–146)

## 2016-08-23 ENCOUNTER — Encounter: Payer: Self-pay | Admitting: Cardiovascular Disease

## 2016-08-24 ENCOUNTER — Encounter: Payer: Self-pay | Admitting: Pharmacist

## 2016-08-24 ENCOUNTER — Ambulatory Visit (INDEPENDENT_AMBULATORY_CARE_PROVIDER_SITE_OTHER): Payer: Medicare Other | Admitting: Pharmacist

## 2016-08-24 DIAGNOSIS — Z23 Encounter for immunization: Secondary | ICD-10-CM | POA: Diagnosis not present

## 2016-08-24 DIAGNOSIS — F17200 Nicotine dependence, unspecified, uncomplicated: Secondary | ICD-10-CM

## 2016-08-24 DIAGNOSIS — B351 Tinea unguium: Secondary | ICD-10-CM | POA: Diagnosis present

## 2016-08-24 DIAGNOSIS — J449 Chronic obstructive pulmonary disease, unspecified: Secondary | ICD-10-CM | POA: Diagnosis not present

## 2016-08-24 DIAGNOSIS — Z720 Tobacco use: Secondary | ICD-10-CM | POA: Diagnosis not present

## 2016-08-24 MED ORDER — FLUTICASONE-UMECLIDIN-VILANT 100-62.5-25 MCG/INH IN AEPB: 1.0000 | INHALATION_SPRAY | Freq: Every day | RESPIRATORY_TRACT | 0 refills | Status: DC

## 2016-08-24 NOTE — Progress Notes (Signed)
   S:    Patient arrives slightly agitated and limping. Presents for lung function evaluation after attempting to quit smoking. He reports taking Wellbutrin 75 mg BID and NRT patch 21 mg which he says have not helped him quit smoking. He reports cutting back smoking to 10-15 cigarettes a day, but has not smoked yet today. The least amount of cigarettes he has smoked in a day is 10, with 15 being the highest. Patient reports breathing has been the same as normal (difficulty breathing, cough). He reports being prescribed lisinopril recently but didn't pick it up from the pharmacy because he couldn't afford it. 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 He reports seasonal allergies is making his breathing worse.   Last used albuterol yesterday. Has not taken Anoro Ellipta within the past week.   O: mMRC score= >2 CAT score= 26 See Documentation Flowsheet - CAT/COPD for complete symptom scoring.  See "scanned report" or Documentation Flowsheet (discrete results - PFTs) for  Spirometry results. Patient provided good effort while attempting spirometry.   Lung Age = 89 Albuterol Neb Lot# [id number] Exp. Nov 19  A/P: Spirometry evaluation reveals moderately severe obstructive lung disease corresponding to GOLD Classification D based on spirometry Stage 2, 1 hospitalization for bronchitis in the past year and CAT score of 26. Post nebulized albuterol tx revealed moderate reversibility of 13% change. Initiate Trelegy inhaler, 1 puff once a day. Educated patient on purpose, proper use, potential adverse effects including risk of esophageal candidiasis and need to rinse mouth after each use.  Reviewed results of pulmonary function tests.Pt verbalized understanding of results and education.  Patient experiencing seasonal allergies is also contributing to his decreased lung function. Initiate cetirizine or loratidine OTC.  Patient's smoking history is contributing significantly to his  lung dysfunction. Counseled patient on importance of quitting smoking. Discussed triggers and was to cut back on smoking, such as drinking coffee inside of [employer] instead of taking it outside and smoking.   Written pt instructions provided.  F/U Clinic visit 3-4 weeks.   Total time in face to face counseling 35 minutes.  Patient seen with Myrna Blazer, PharmD Candidate, Ida Rogue, PharmD Candidate,. and Bennye Alm, PharmD, BCPS, PGY2 Resident.

## 2016-08-24 NOTE — Assessment & Plan Note (Signed)
Spirometry evaluation reveals moderately severe obstructive lung disease corresponding to GOLD Classification D based on spirometry Stage 2, 1 hospitalization for bronchitis in the past year and CAT score of 26. Post nebulized albuterol tx revealed moderate reversibility of 13% change. Initiate Trelegy inhaler, 1 puff once a day. Educated patient on purpose, proper use, potential adverse effects including risk of esophageal candidiasis and need to rinse mouth after each use.  Reviewed results of pulmonary function tests.Pt verbalized understanding of results and education.

## 2016-08-24 NOTE — Addendum Note (Signed)
Addended by: Leavy Cella on: 08/24/2016 07:01 PM   Modules accepted: Orders

## 2016-08-24 NOTE — Patient Instructions (Addendum)
Thank you for coming in today! Your lung function tests showed that you have COPD and a lung age of 61 years old. The biggest thing you can do to help your lungs is to quit smoking. Please stop taking the Anoro Ellipta inhaler. Start using the Trelegy inhaler, 1 puff once a day. Please schedule a follow up appointment for 3-4 weeks from today.

## 2016-08-24 NOTE — Progress Notes (Signed)
Patient ID: [name], male   DOB: 1955-08-12, 61 y.o.   MRN: [id number] Reviewed: Agree with Dr. Graylin Shiver documentation and management.

## 2016-08-24 NOTE — Assessment & Plan Note (Signed)
Patient experiencing seasonal allergies is also contributing to his decreased lung function. Initiate cetirizine or loratidine OTC.  Patient's smoking history is contributing significantly to his lung dysfunction. Counseled patient on importance of quitting smoking. Discussed triggers and was to cut back on smoking, such as drinking coffee inside of [employer] instead of taking it outside and smoking.

## 2016-08-29 ENCOUNTER — Ambulatory Visit: Payer: Medicare Other | Admitting: *Deleted

## 2016-09-04 ENCOUNTER — Ambulatory Visit (HOSPITAL_COMMUNITY)
Admission: RE | Admit: 2016-09-04 | Discharge: 2016-09-04 | Disposition: A | Discharge status: Home / Self Care | Payer: Medicare Other | Source: Ambulatory Visit | Attending: Cardiology | Admitting: Cardiology

## 2016-09-04 DIAGNOSIS — R0989 Other specified symptoms and signs involving the circulatory and respiratory systems: Secondary | ICD-10-CM | POA: Insufficient documentation

## 2016-09-04 DIAGNOSIS — I6523 Occlusion and stenosis of bilateral carotid arteries: Secondary | ICD-10-CM | POA: Insufficient documentation

## 2016-09-04 DIAGNOSIS — I739 Peripheral vascular disease, unspecified: Secondary | ICD-10-CM | POA: Insufficient documentation

## 2016-09-07 ENCOUNTER — Telehealth: Payer: Self-pay | Admitting: *Deleted

## 2016-09-07 DIAGNOSIS — I739 Peripheral vascular disease, unspecified: Secondary | ICD-10-CM

## 2016-09-07 NOTE — Telephone Encounter (Signed)
-----   Message from Lorretta Harp, MD sent at 09/06/2016  3:32 PM EDT ----- Improved left ABI from 0.78-1.1 with a patent left SFA. Repeat in 6 months

## 2016-09-11 ENCOUNTER — Ambulatory Visit (INDEPENDENT_AMBULATORY_CARE_PROVIDER_SITE_OTHER): Payer: Medicare Other | Admitting: Family Medicine

## 2016-09-11 ENCOUNTER — Encounter: Payer: Self-pay | Admitting: Family Medicine

## 2016-09-11 VITALS — BP 176/82 | HR 87 | Temp 97.3°F | Ht 68.0 in | Wt 164.8 lb

## 2016-09-11 DIAGNOSIS — H1010 Acute atopic conjunctivitis, unspecified eye: Secondary | ICD-10-CM | POA: Insufficient documentation

## 2016-09-11 DIAGNOSIS — I10 Essential (primary) hypertension: Secondary | ICD-10-CM

## 2016-09-11 DIAGNOSIS — H1013 Acute atopic conjunctivitis, bilateral: Secondary | ICD-10-CM | POA: Diagnosis not present

## 2016-09-11 MED ORDER — OLOPATADINE HCL 0.2 % OP SOLN: 1.0000 [drp] | Freq: Every day | OPHTHALMIC | 1 refills | Status: DC

## 2016-09-11 MED ORDER — HYPROMELLOSE (GONIOSCOPIC) 2.5 % OP SOLN: 1.0000 [drp] | Freq: Three times a day (TID) | OPHTHALMIC | 12 refills | Status: DC | PRN

## 2016-09-11 NOTE — Patient Instructions (Signed)
Allergic Conjunctivitis A clear membrane (conjunctiva) covers the white part of your eye and the inner surface of your eyelid. Allergic conjunctivitis happens when this membrane has inflammation. This is caused by allergies. Common causes of allergic reactions (allergens)include:  Outdoor allergens, such as:  Pollen.  Grass and weeds.  Mold spores.  Indoor allergens, such as:  Dust.  Smoke.  Mold.  Pet dander.  Animal hair. This condition can make your eye red or pink. It can also make your eye feel itchy. This condition cannot be spread from one person to another person (is not contagious). Follow these instructions at home:  Try not to be around things that you are allergic to.  Take or apply over-the-counter and prescription medicines only as told by your doctor. These include any eye drops.  Place a cool, clean washcloth on your eye for 10-20 minutes. Do this 3-4 times a day.  Do not touch or rub your eyes.  Do not wear contact lenses until the inflammation is gone. Wear glasses instead.  Do not wear eye makeup until the inflammation is gone.  Keep all follow-up visits as told by your doctor. This is important. Contact a doctor if:  Your symptoms get worse.  Your symptoms do not get better with treatment.  You have mild eye pain.  You are sensitive to light,  You have spots or blisters on your eyes.  You have pus coming from your eye.  You have a fever. Get help right away if:  You have redness, swelling, or other symptoms in only one eye.  Your vision is blurry.  You have vision changes.  You have very bad eye pain. Summary  Allergic conjunctivitis is caused by allergies. It can make your eye red or pink, and it can make your eye feel itchy.  This condition cannot be spread from one person to another person (is not contagious).  Try not to be around things that you are allergic to.  Take or apply over-the-counter and prescription medicines  only as told by your doctor. These include any eye drops.  Contact your doctor if your symptoms get worse or they do not get better with treatment. This information is not intended to replace advice given to you by your health care provider. Make sure you discuss any questions you have with your health care provider. Document Released: 11/02/2009 Document Revised: 01/07/2016 Document Reviewed: 01/07/2016 Elsevier Interactive Patient Education  2017 [employer].

## 2016-09-11 NOTE — Progress Notes (Signed)
   Subjective:   [name] is a 61 y.o. male with a history of GERD, migraine headaches, PAD, HTN, COPD, allergic rhinitis, small cell lung cancer, CVA here for same day appointment for  Chief Complaint  Patient presents with  . Burning Eyes     Patient reports burning, redness, dryness, itching of bilateral eyes. This is been present in the right eye for about one month and in the left eye for about 1 week. He denies any trauma or injury to either eye. No change in vision. No pain with eye movement. No drainage, crusting of eyes, fevers. He is tried washing his eyes out with water without any help. Visine seems to make the burning worse. He thinks that he has seasonal allergies that have been worsening recently.  BP was noted to be elevated. Patient reports that he took his antihypertensives today. In recent doctor's visit, he has noticed a similar blood pressure. He denies chest pain, shortness of breath, headache, vision changes, lower extremity edema.  Review of Systems:  Per HPI.   Social History: former smoker - no cigarettes in 16 days!!!  Objective:  BP (!) 176/82   Pulse 87   Temp 97.3 F (36.3 C) (Oral)   Ht '5\' 8"'$  (9.211 m)   Wt 164 lb 12.8 oz (74.8 kg)   SpO2 99%   BMI 25.06 kg/m   Gen:  61 y.o. male in NAD HEENT: NCAT, MMM, EOMI, PERRL, anicteric sclerae, injected sclera, no foreign bodies noted CV: RRR, no MRG Resp: Non-labored, CTAB, no wheezes noted Ext: WWP, no edema MSK: No obvious deformities, gait intact Neuro: Alert and oriented, speech normal      Assessment & Plan:     [name] is a 61 y.o. male here for   Allergic conjunctivitis Exam consistent with allergic conjunctivitis No red flag symptoms like vision changes or eye pain with movement Advised stopping visine eyedrops Start Pataday eyedrops daily  Can also use artificial tears at other times the day as needed for dryness Return precautions discussed   Advised PCP follow-up for  hypertension  Congratulated patient on his smoking cessation  Virginia Crews, MD MPH PGY-3,  [facility] 09/11/2016  4:17 PM

## 2016-09-11 NOTE — Assessment & Plan Note (Signed)
Exam consistent with allergic conjunctivitis No red flag symptoms like vision changes or eye pain with movement Advised stopping visine eyedrops Start Pataday eyedrops daily  Can also use artificial tears at other times the day as needed for dryness Return precautions discussed

## 2016-09-14 ENCOUNTER — Telehealth: Payer: Self-pay | Admitting: Cardiovascular Disease

## 2016-09-14 NOTE — Telephone Encounter (Signed)
Pt wants to know when does he need his next appointment with Dr Gwenlyn Found?

## 2016-09-14 NOTE — Telephone Encounter (Signed)
Spoke w patient. He has an upcoming visit w Dr. Andria Frames for BP issues. Did not want to see our pharmD to manage this, prefers this to be done by fam med. Informed me he wished to cancel the upcoming clinic visit w [place], see Dr. Andria Frames as scheduled, and f/u w Dr. Gwenlyn Found in 1 yr as planned.  Pt aware I've cancelled upcoming pharmD BP management visit, and to call if further needs.

## 2016-09-15 ENCOUNTER — Telehealth: Payer: Self-pay | Admitting: Cardiovascular Disease

## 2016-09-15 NOTE — Telephone Encounter (Signed)
f/u Message  Pt call requesting to speak with RN about previous telephone message

## 2016-09-19 ENCOUNTER — Ambulatory Visit: Payer: Medicare Other

## 2016-09-21 ENCOUNTER — Ambulatory Visit (INDEPENDENT_AMBULATORY_CARE_PROVIDER_SITE_OTHER): Payer: Medicare Other | Admitting: *Deleted

## 2016-09-21 ENCOUNTER — Encounter: Payer: Self-pay | Admitting: Pharmacist

## 2016-09-21 ENCOUNTER — Encounter: Payer: Self-pay | Admitting: Licensed Clinical Social Worker

## 2016-09-21 ENCOUNTER — Encounter: Payer: Self-pay | Admitting: *Deleted

## 2016-09-21 ENCOUNTER — Ambulatory Visit (INDEPENDENT_AMBULATORY_CARE_PROVIDER_SITE_OTHER): Payer: Medicare Other | Admitting: Pharmacist

## 2016-09-21 VITALS — Ht 67.0 in | Wt 159.0 lb

## 2016-09-21 DIAGNOSIS — Z0000 Encounter for general adult medical examination without abnormal findings: Secondary | ICD-10-CM | POA: Diagnosis not present

## 2016-09-21 DIAGNOSIS — H1013 Acute atopic conjunctivitis, bilateral: Secondary | ICD-10-CM | POA: Diagnosis present

## 2016-09-21 DIAGNOSIS — J301 Allergic rhinitis due to pollen: Secondary | ICD-10-CM | POA: Diagnosis not present

## 2016-09-21 DIAGNOSIS — J449 Chronic obstructive pulmonary disease, unspecified: Secondary | ICD-10-CM | POA: Diagnosis not present

## 2016-09-21 DIAGNOSIS — F17200 Nicotine dependence, unspecified, uncomplicated: Secondary | ICD-10-CM

## 2016-09-21 DIAGNOSIS — R739 Hyperglycemia, unspecified: Secondary | ICD-10-CM | POA: Diagnosis not present

## 2016-09-21 LAB — POCT GLYCOSYLATED HEMOGLOBIN (HGB A1C): HEMOGLOBIN A1C: 5.9

## 2016-09-21 MED ORDER — CETIRIZINE HCL 10 MG PO TABS: 10.0000 mg | ORAL_TABLET | Freq: Every day | ORAL | 11 refills | Status: DC

## 2016-09-21 NOTE — Patient Instructions (Addendum)
Keep up the good work with your smoking  Start Zyrtec (cetirizine) 10 mg daily for allergies  Followup with Dr Andria Frames in 1 week

## 2016-09-21 NOTE — Patient Instructions (Signed)
Fall Prevention in the Home Falls can cause injuries. They can happen to people of all ages. There are many things you can do to make your home safe and to help prevent falls. What can I do on the outside of my home?  Regularly fix the edges of walkways and driveways and fix any cracks.  Remove anything that might make you trip as you walk through a door, such as a raised step or threshold.  Trim any bushes or trees on the path to your home.  Use bright outdoor lighting.  Clear any walking paths of anything that might make someone trip, such as rocks or tools.  Regularly check to see if handrails are loose or broken. Make sure that both sides of any steps have handrails.  Any raised decks and porches should have guardrails on the edges.  Have any leaves, snow, or ice cleared regularly.  Use sand or salt on walking paths during winter.  Clean up any spills in your garage right away. This includes oil or grease spills. What can I do in the bathroom?  Use night lights.  Install grab bars by the toilet and in the tub and shower. Do not use towel bars as grab bars.  Use non-skid mats or decals in the tub or shower.  If you need to sit down in the shower, use a plastic, non-slip stool.  Keep the floor dry. Clean up any water that spills on the floor as soon as it happens.  Remove soap buildup in the tub or shower regularly.  Attach bath mats securely with double-sided non-slip rug tape.  Do not have throw rugs and other things on the floor that can make you trip. What can I do in the bedroom?  Use night lights.  Make sure that you have a light by your bed that is easy to reach.  Do not use any sheets or blankets that are too big for your bed. They should not hang down onto the floor.  Have a firm chair that has side arms. You can use this for support while you get dressed.  Do not have throw rugs and other things on the floor that can make you trip. What can I do in  the kitchen?  Clean up any spills right away.  Avoid walking on wet floors.  Keep items that you use a lot in easy-to-reach places.  If you need to reach something above you, use a strong step stool that has a grab bar.  Keep electrical cords out of the way.  Do not use floor polish or wax that makes floors slippery. If you must use wax, use non-skid floor wax.  Do not have throw rugs and other things on the floor that can make you trip. What can I do with my stairs?  Do not leave any items on the stairs.  Make sure that there are handrails on both sides of the stairs and use them. Fix handrails that are broken or loose. Make sure that handrails are as long as the stairways.  Check any carpeting to make sure that it is firmly attached to the stairs. Fix any carpet that is loose or worn.  Avoid having throw rugs at the top or bottom of the stairs. If you do have throw rugs, attach them to the floor with carpet tape.  Make sure that you have a light switch at the top of the stairs and the bottom of the stairs. If you  do not have them, ask someone to add them for you. What else can I do to help prevent falls?  Wear shoes that:  Do not have high heels.  Have rubber bottoms.  Are comfortable and fit you well.  Are closed at the toe. Do not wear sandals.  If you use a stepladder:  Make sure that it is fully opened. Do not climb a closed stepladder.  Make sure that both sides of the stepladder are locked into place.  Ask someone to hold it for you, if possible.  Clearly mark and make sure that you can see:  Any grab bars or handrails.  First and last steps.  Where the edge of each step is.  Use tools that help you move around (mobility aids) if they are needed. These include:  Canes.  Walkers.  Scooters.  Crutches.  Turn on the lights when you go into a dark area. Replace any light bulbs as soon as they burn out.  Set up your furniture so you have a clear  path. Avoid moving your furniture around.  If any of your floors are uneven, fix them.  If there are any pets around you, be aware of where they are.  Review your medicines with your doctor. Some medicines can make you feel dizzy. This can increase your chance of falling. Ask your doctor what other things that you can do to help prevent falls. This information is not intended to replace advice given to you by your health care provider. Make sure you discuss any questions you have with your health care provider. Document Released: 03/11/2009 Document Revised: 10/21/2015 Document Reviewed: 06/19/2014 Elsevier Interactive Patient Education  2017 Elsevier Inc.   Hearing Loss Hearing loss is a partial or total loss of the ability to hear. This can be temporary or permanent, and it can happen in one or both ears. Hearing loss may be referred to as deafness. Medical care is necessary to treat hearing loss properly and to prevent the condition from getting worse. Your hearing may partially or completely come back, depending on what caused your hearing loss and how severe it is. In some cases, hearing loss is permanent. What are the causes? Common causes of hearing loss include:  Too much wax in the ear canal.  Infection of the ear canal or middle ear.  Fluid in the middle ear.  Injury to the ear or surrounding area.  An object stuck in the ear.  Prolonged exposure to loud sounds, such as music. Less common causes of hearing loss include:  Tumors in the ear.  Viral or bacterial infections, such as meningitis.  A hole in the eardrum (perforated eardrum).  Problems with the hearing nerve that sends signals between the brain and the ear.  Certain medicines. What are the signs or symptoms? Symptoms of this condition may include:  Difficulty telling the difference between sounds.  Difficulty following a conversation when there is background noise.  Lack of response to sounds in  your environment. This may be most noticeable when you do not respond to startling sounds.  Needing to turn up the volume on the television, radio, etc.  Ringing in the ears.  Dizziness.  Pain in the ears. How is this diagnosed? This condition is diagnosed based on a physical exam and a hearing test (audiometry). The audiometry test will be performed by a hearing specialist (audiologist). You may also be referred to an ear, nose, and throat (ENT) specialist (otolaryngologist). How is this  treated? Treatment for recent onset of hearing loss may include:  Ear wax removal.  Being prescribed medicines to prevent infection (antibiotics).  Being prescribed medicines to reduce inflammation (corticosteroids). Follow these instructions at home:  If you were prescribed an antibiotic medicine, take it as told by your health care provider. Do not stop taking the antibiotic even if you start to feel better.  Take over-the-counter and prescription medicines only as told by your health care provider.  Avoid loud noises.  Return to your normal activities as told by your health care provider. Ask your health care provider what activities are safe for you.  Keep all follow-up visits as told by your health care provider. This is important. Contact a health care provider if:  You feel dizzy.  You develop new symptoms.  You vomit or feel nauseous.  You have a fever. Get help right away if:  You develop sudden changes in your vision.  You have severe ear pain.  You have new or increased weakness.  You have a severe headache. This information is not intended to replace advice given to you by your health care provider. Make sure you discuss any questions you have with your health care provider. Document Released: 05/15/2005 Document Revised: 10/21/2015 Document Reviewed: 09/30/2014 Elsevier Interactive Patient Education  2017 [employer].   Steps to Quit Smoking Smoking tobacco can be  bad for your health. It can also affect almost every organ in your body. Smoking puts you and people around you at risk for many serious long-lasting (chronic) diseases. Quitting smoking is hard, but it is one of the best things that you can do for your health. It is never too late to quit. What are the benefits of quitting smoking? When you quit smoking, you lower your risk for getting serious diseases and conditions. They can include:  Lung cancer or lung disease.  Heart disease.  Stroke.  Heart attack.  Not being able to have children (infertility).  Weak bones (osteoporosis) and broken bones (fractures). If you have coughing, wheezing, and shortness of breath, those symptoms may get better when you quit. You may also get sick less often. If you are pregnant, quitting smoking can help to lower your chances of having a baby of low birth weight. What can I do to help me quit smoking? Talk with your doctor about what can help you quit smoking. Some things you can do (strategies) include:  Quitting smoking totally, instead of slowly cutting back how much you smoke over a period of time.  Going to in-person counseling. You are more likely to quit if you go to many counseling sessions.  Using resources and support systems, such as:  Online chats with a Social worker.  Phone quitlines.  Printed Furniture conservator/restorer.  Support groups or group counseling.  Text messaging programs.  Mobile phone apps or applications.  Taking medicines. Some of these medicines may have nicotine in them. If you are pregnant or breastfeeding, do not take any medicines to quit smoking unless your doctor says it is okay. Talk with your doctor about counseling or other things that can help you. Talk with your doctor about using more than one strategy at the same time, such as taking medicines while you are also going to in-person counseling. This can help make quitting easier. What things can I do to make it  easier to quit? Quitting smoking might feel very hard at first, but there is a lot that you can do to make it  easier. Take these steps:  Talk to your family and friends. Ask them to support and encourage you.  Call phone quitlines, reach out to support groups, or work with a Social worker.  Ask people who smoke to not smoke around you.  Avoid places that make you want (trigger) to smoke, such as:  Bars.  Parties.  Smoke-break areas at work.  Spend time with people who do not smoke.  Lower the stress in your life. Stress can make you want to smoke. Try these things to help your stress:  Getting regular exercise.  Deep-breathing exercises.  Yoga.  Meditating.  Doing a body scan. To do this, close your eyes, focus on one area of your body at a time from head to toe, and notice which parts of your body are tense. Try to relax the muscles in those areas.  Download or buy apps on your mobile phone or tablet that can help you stick to your quit plan. There are many free apps, such as QuitGuide from the [employer] Office manager for Disease Control and Prevention). You can find more support from smokefree.gov and other websites. This information is not intended to replace advice given to you by your health care provider. Make sure you discuss any questions you have with your health care provider. Document Released: 03/11/2009 Document Revised: 01/11/2016 Document Reviewed: 09/29/2014 Elsevier Interactive Patient Education  2017 [employer].
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
## 2016-09-21 NOTE — Assessment & Plan Note (Signed)
COPD: Class D mildly uncontrolled due to persistent productive cough and SOB. Has been taking Trelegy 1 puff once daily for 20 days. Allergies are worsening his cough and SOB. Initiate cetirizine 10 mg once daily.

## 2016-09-21 NOTE — Assessment & Plan Note (Signed)
Allergic conjunctivitis: Right eye is persistently itchy, red, and watery due to allergies. Initiate cetirizine 10 mg once daily.

## 2016-09-25 ENCOUNTER — Encounter: Payer: Self-pay | Admitting: *Deleted

## 2016-09-25 NOTE — Progress Notes (Signed)
Patient ID: [name], male   DOB: Oct 17, 1955, 61 y.o.   MRN: [id number] Reviewed: Agree with Dr. Graylin Shiver documentation and management.

## 2016-09-25 NOTE — Progress Notes (Signed)
Patient ID: [name], male   DOB: 03-10-1956, 61 y.o.   MRN: [id number] I have reviewed this visit and discussed with Howell Rucks, RN, BSN, and agree with her documentation.

## 2016-09-27 ENCOUNTER — Ambulatory Visit (INDEPENDENT_AMBULATORY_CARE_PROVIDER_SITE_OTHER): Payer: Medicare Other | Admitting: Family Medicine

## 2016-09-27 ENCOUNTER — Encounter: Payer: Self-pay | Admitting: Family Medicine

## 2016-09-27 DIAGNOSIS — I10 Essential (primary) hypertension: Secondary | ICD-10-CM

## 2016-09-27 MED ORDER — LISINOPRIL-HYDROCHLOROTHIAZIDE 10-12.5 MG PO TABS: 1.0000 | ORAL_TABLET | Freq: Every day | ORAL | 3 refills | Status: DC

## 2016-09-27 NOTE — Patient Instructions (Signed)
I want to increase your lisinopril from 5 mg per day to 10 per day.  I want to keep the HCTZ dose the same at 12.5 mg. You can use up your current supply by taking two lisinopril and one HCTZ every day. I did send in a new prescription with both pills combined into one, where you will only need to take one pill a day.   If your home blood pressure readings are good, see me in three months. If your blood pressure is high even on the higher dose, see me in one month.

## 2016-09-27 NOTE — Progress Notes (Signed)
   Subjective:    Patient ID: [name], male    DOB: Dec 12, 1955, 61 y.o.   MRN: [id number]  HPI FU hypertension. BPs both here and at home have run a bit high.  On low salt diet.  Taking meds as prescribed.  No chest pain, SOB or leg edema.  Review of Systems     Objective:   Physical Exam VS noted Lungs clear Cardiac RRR without m or g Ext no edema.        Assessment & Plan:

## 2016-09-27 NOTE — Assessment & Plan Note (Signed)
Cont HCTZ.  Increase lisinopril from 5 to 10 mg daily.  Prescribe combined pill for compliance and cost reasons.

## 2016-11-20 ENCOUNTER — Encounter: Payer: Self-pay | Admitting: Psychology

## 2016-11-20 DIAGNOSIS — H2513 Age-related nuclear cataract, bilateral: Secondary | ICD-10-CM | POA: Diagnosis not present

## 2016-11-20 DIAGNOSIS — H25013 Cortical age-related cataract, bilateral: Secondary | ICD-10-CM | POA: Diagnosis not present

## 2016-11-20 NOTE — Progress Notes (Unsigned)
Patient arrived at [facility] to schedule appointment with PCP and to access food from [facility] food pantry. [facility] Caroleen Hamman) was available to meet with patient. Patient and [employer] pulled canned goods from food pantry. [facility] also provided patient with green and blue booklets and discussed community resources for free meals. Patient appeared interested in resources and identified particular resources he would like to follow-up with.

## 2016-11-23 ENCOUNTER — Ambulatory Visit (INDEPENDENT_AMBULATORY_CARE_PROVIDER_SITE_OTHER): Payer: Medicare Other | Admitting: Family Medicine

## 2016-11-23 ENCOUNTER — Ambulatory Visit
Admission: RE | Admit: 2016-11-23 | Discharge: 2016-11-23 | Disposition: A | Discharge status: Home / Self Care | Payer: Medicare Other | Source: Ambulatory Visit | Attending: Family Medicine | Admitting: Family Medicine

## 2016-11-23 ENCOUNTER — Encounter: Payer: Self-pay | Admitting: Family Medicine

## 2016-11-23 VITALS — BP 138/82 | HR 89 | Temp 97.6°F | Ht 67.0 in | Wt 157.4 lb

## 2016-11-23 DIAGNOSIS — J449 Chronic obstructive pulmonary disease, unspecified: Secondary | ICD-10-CM | POA: Diagnosis not present

## 2016-11-23 DIAGNOSIS — C3490 Malignant neoplasm of unspecified part of unspecified bronchus or lung: Secondary | ICD-10-CM

## 2016-11-23 DIAGNOSIS — R32 Unspecified urinary incontinence: Secondary | ICD-10-CM

## 2016-11-23 DIAGNOSIS — R059 Cough, unspecified: Secondary | ICD-10-CM

## 2016-11-23 DIAGNOSIS — R05 Cough: Secondary | ICD-10-CM | POA: Diagnosis not present

## 2016-11-23 DIAGNOSIS — R159 Full incontinence of feces: Secondary | ICD-10-CM

## 2016-11-23 LAB — POCT URINALYSIS DIP (MANUAL ENTRY)
Glucose, UA: NEGATIVE mg/dL
LEUKOCYTES UA: NEGATIVE
NITRITE UA: NEGATIVE
Spec Grav, UA: 1.025 (ref 1.010–1.025)
UROBILINOGEN UA: 0.2 U/dL
pH, UA: 6 (ref 5.0–8.0)

## 2016-11-23 MED ORDER — LORAZEPAM 1 MG PO TABS: ORAL_TABLET | ORAL | 0 refills | Status: DC

## 2016-11-23 NOTE — Patient Instructions (Signed)
Get the chest x ray today. They will schedule you for the MRI I will give you some medicine to relax you before the MRI.

## 2016-11-24 NOTE — Assessment & Plan Note (Signed)
New symptom, decreased rectal tone and history of lung cancer makes me concerned about spinal cord lesion.  Will image.

## 2016-11-24 NOTE — Assessment & Plan Note (Signed)
Worsening.  Again emphasized smoking cessation.

## 2016-11-24 NOTE — Progress Notes (Signed)
   Subjective:    Patient ID: [name], male    DOB: Jul 20, 1955, 61 y.o.   MRN: [id number]  HPI  Patient complains of urinary urgency and incontinence present for years but getting worse.  Now also complains of worsening rectal urgency and incontinence worse over last few months.  "I feel the urge and if I don't get to the bathroom right away I will have shit running down my leg."  Not diarrhea.  Has a history of both lumbar and cervical spine problems but says back is not particularly hurting him right now.  Making the diff dx more complicated is his history of small cell lung cancer, dxed in 2008 and treated.  He has no evidence of recurrent/metastatic disease.  Sadly, he continues to smoke.  So, he is at risk not only for recurrence but also for second primary.    COPD, cough is worse of late.  Again, he continues to smoke.      Review of Systems     Objective:   Physical Exam  Lungs prolonged exp phase and scattered coarse rhonchi.  Abd benign.  Bladder does not feel or percuss to be distended.  Rectal is normal except decreased rectal sphincter tone.   Back, no bony tenderness over cervical, thoracic or lumbar spine.          Assessment & Plan:

## 2016-11-24 NOTE — Assessment & Plan Note (Signed)
Given hx and continued smoking, I am more worried about new primary metastatic to spinal cord than a recurrence of his original disease.  Also, he would be at risk for paraneoplastic syndrome with small cell ca.  Start by CXR and spinal cord imaging.

## 2016-12-01 ENCOUNTER — Encounter (HOSPITAL_COMMUNITY): Payer: Self-pay | Admitting: Emergency Medicine

## 2016-12-01 ENCOUNTER — Inpatient Hospital Stay (HOSPITAL_COMMUNITY)
Admission: EM | Admit: 2016-12-01 | Discharge: 2016-12-02 | DRG: 192 | Discharge status: Against Medical Advice | Payer: Medicare Other | Attending: Family Medicine | Admitting: Family Medicine

## 2016-12-01 ENCOUNTER — Emergency Department (HOSPITAL_COMMUNITY): Payer: Medicare Other

## 2016-12-01 DIAGNOSIS — Z8673 Personal history of transient ischemic attack (TIA), and cerebral infarction without residual deficits: Secondary | ICD-10-CM | POA: Diagnosis not present

## 2016-12-01 DIAGNOSIS — F17210 Nicotine dependence, cigarettes, uncomplicated: Secondary | ICD-10-CM | POA: Diagnosis present

## 2016-12-01 DIAGNOSIS — Z7982 Long term (current) use of aspirin: Secondary | ICD-10-CM | POA: Diagnosis not present

## 2016-12-01 DIAGNOSIS — Z9221 Personal history of antineoplastic chemotherapy: Secondary | ICD-10-CM

## 2016-12-01 DIAGNOSIS — G629 Polyneuropathy, unspecified: Secondary | ICD-10-CM | POA: Diagnosis present

## 2016-12-01 DIAGNOSIS — Z9119 Patient's noncompliance with other medical treatment and regimen: Secondary | ICD-10-CM

## 2016-12-01 DIAGNOSIS — E7800 Pure hypercholesterolemia, unspecified: Secondary | ICD-10-CM | POA: Diagnosis present

## 2016-12-01 DIAGNOSIS — F329 Major depressive disorder, single episode, unspecified: Secondary | ICD-10-CM | POA: Diagnosis present

## 2016-12-01 DIAGNOSIS — Z9114 Patient's other noncompliance with medication regimen: Secondary | ICD-10-CM | POA: Diagnosis not present

## 2016-12-01 DIAGNOSIS — I10 Essential (primary) hypertension: Secondary | ICD-10-CM | POA: Diagnosis present

## 2016-12-01 DIAGNOSIS — Z5321 Procedure and treatment not carried out due to patient leaving prior to being seen by health care provider: Secondary | ICD-10-CM | POA: Diagnosis not present

## 2016-12-01 DIAGNOSIS — R0602 Shortness of breath: Secondary | ICD-10-CM

## 2016-12-01 DIAGNOSIS — J441 Chronic obstructive pulmonary disease with (acute) exacerbation: Secondary | ICD-10-CM | POA: Diagnosis not present

## 2016-12-01 DIAGNOSIS — Z85118 Personal history of other malignant neoplasm of bronchus and lung: Secondary | ICD-10-CM | POA: Diagnosis not present

## 2016-12-01 DIAGNOSIS — J9601 Acute respiratory failure with hypoxia: Secondary | ICD-10-CM

## 2016-12-01 DIAGNOSIS — K219 Gastro-esophageal reflux disease without esophagitis: Secondary | ICD-10-CM | POA: Diagnosis present

## 2016-12-01 DIAGNOSIS — R159 Full incontinence of feces: Secondary | ICD-10-CM | POA: Diagnosis present

## 2016-12-01 DIAGNOSIS — Z79899 Other long term (current) drug therapy: Secondary | ICD-10-CM | POA: Diagnosis not present

## 2016-12-01 DIAGNOSIS — R05 Cough: Secondary | ICD-10-CM | POA: Diagnosis not present

## 2016-12-01 DIAGNOSIS — D509 Iron deficiency anemia, unspecified: Secondary | ICD-10-CM | POA: Diagnosis present

## 2016-12-01 DIAGNOSIS — R32 Unspecified urinary incontinence: Secondary | ICD-10-CM | POA: Diagnosis present

## 2016-12-01 DIAGNOSIS — Z8619 Personal history of other infectious and parasitic diseases: Secondary | ICD-10-CM | POA: Diagnosis not present

## 2016-12-01 DIAGNOSIS — Z66 Do not resuscitate: Secondary | ICD-10-CM | POA: Diagnosis present

## 2016-12-01 DIAGNOSIS — I2510 Atherosclerotic heart disease of native coronary artery without angina pectoris: Secondary | ICD-10-CM | POA: Diagnosis present

## 2016-12-01 DIAGNOSIS — Z923 Personal history of irradiation: Secondary | ICD-10-CM | POA: Diagnosis not present

## 2016-12-01 DIAGNOSIS — R0600 Dyspnea, unspecified: Secondary | ICD-10-CM | POA: Diagnosis present

## 2016-12-01 LAB — BASIC METABOLIC PANEL
ANION GAP: 8 (ref 5–15)
ANION GAP: 9 (ref 5–15)
BUN: 12 mg/dL (ref 6–20)
BUN: 11 mg/dL (ref 6–20)
CALCIUM: 9.1 mg/dL (ref 8.9–10.3)
CHLORIDE: 105 mmol/L (ref 101–111)
CO2: 25 mmol/L (ref 22–32)
CO2: 23 mmol/L (ref 22–32)
Calcium: 9 mg/dL (ref 8.9–10.3)
Chloride: 103 mmol/L (ref 101–111)
Creatinine, Ser: 1.03 mg/dL (ref 0.61–1.24)
Creatinine, Ser: 0.92 mg/dL (ref 0.61–1.24)
Glucose, Bld: 128 mg/dL — ABNORMAL HIGH (ref 65–99)
Glucose, Bld: 182 mg/dL — ABNORMAL HIGH (ref 65–99)
POTASSIUM: 4 mmol/L (ref 3.5–5.1)
POTASSIUM: 4.1 mmol/L (ref 3.5–5.1)
SODIUM: 137 mmol/L (ref 135–145)
Sodium: 136 mmol/L (ref 135–145)

## 2016-12-01 LAB — CBC
HEMATOCRIT: 28.1 % — AB (ref 39.0–52.0)
HEMATOCRIT: 26.9 % — AB (ref 39.0–52.0)
HEMOGLOBIN: 8 g/dL — AB (ref 13.0–17.0)
Hemoglobin: 7.6 g/dL — ABNORMAL LOW (ref 13.0–17.0)
MCH: 20.6 pg — ABNORMAL LOW (ref 26.0–34.0)
MCH: 20.5 pg — ABNORMAL LOW (ref 26.0–34.0)
MCHC: 28.5 g/dL — ABNORMAL LOW (ref 30.0–36.0)
MCHC: 28.3 g/dL — ABNORMAL LOW (ref 30.0–36.0)
MCV: 72.4 fL — ABNORMAL LOW (ref 78.0–100.0)
MCV: 72.7 fL — AB (ref 78.0–100.0)
Platelets: 237 10*3/uL (ref 150–400)
Platelets: 261 10*3/uL (ref 150–400)
RBC: 3.88 MIL/uL — AB (ref 4.22–5.81)
RBC: 3.7 MIL/uL — AB (ref 4.22–5.81)
RDW: 17.8 % — ABNORMAL HIGH (ref 11.5–15.5)
RDW: 18 % — ABNORMAL HIGH (ref 11.5–15.5)
WBC: 5 10*3/uL (ref 4.0–10.5)
WBC: 4.1 10*3/uL (ref 4.0–10.5)

## 2016-12-01 LAB — URINALYSIS, ROUTINE W REFLEX MICROSCOPIC
Bilirubin Urine: NEGATIVE
Glucose, UA: NEGATIVE mg/dL
Hgb urine dipstick: NEGATIVE
Ketones, ur: NEGATIVE mg/dL
LEUKOCYTES UA: NEGATIVE
NITRITE: NEGATIVE
Protein, ur: NEGATIVE mg/dL
SPECIFIC GRAVITY, URINE: 1.009 (ref 1.005–1.030)
pH: 5 (ref 5.0–8.0)

## 2016-12-01 LAB — FERRITIN: Ferritin: 6 ng/mL — ABNORMAL LOW (ref 24–336)

## 2016-12-01 LAB — MRSA PCR SCREENING: MRSA by PCR: NEGATIVE

## 2016-12-01 LAB — I-STAT VENOUS BLOOD GAS, ED
Acid-base deficit: 3 mmol/L — ABNORMAL HIGH (ref 0.0–2.0)
Bicarbonate: 23.4 mmol/L (ref 20.0–28.0)
O2 SAT: 88 %
PH VEN: 7.322 (ref 7.250–7.430)
TCO2: 25 mmol/L (ref 0–100)
pCO2, Ven: 45.1 mmHg (ref 44.0–60.0)
pO2, Ven: 60 mmHg — ABNORMAL HIGH (ref 32.0–45.0)

## 2016-12-01 LAB — IRON AND TIBC
IRON: 9 ug/dL — AB (ref 45–182)
Saturation Ratios: 2 % — ABNORMAL LOW (ref 17.9–39.5)
TIBC: 468 ug/dL — ABNORMAL HIGH (ref 250–450)
UIBC: 459 ug/dL

## 2016-12-01 MED ORDER — ENSURE ENLIVE PO LIQD
237.0000 mL | Freq: Two times a day (BID) | ORAL | Status: DC
  Administered 2016-12-01 – 2016-12-02 (×2): 237 mL via ORAL

## 2016-12-01 MED ORDER — HYDROCHLOROTHIAZIDE 12.5 MG PO CAPS
12.5000 mg | ORAL_CAPSULE | Freq: Every day | ORAL | Status: DC
  Administered 2016-12-01 – 2016-12-02 (×2): 12.5 mg via ORAL
  Filled 2016-12-01 (×2): qty 1

## 2016-12-01 MED ORDER — METHYLPREDNISOLONE SODIUM SUCC 125 MG IJ SOLR
125.0000 mg | Freq: Once | INTRAMUSCULAR | Status: AC
  Administered 2016-12-01: 125 mg via INTRAVENOUS
  Filled 2016-12-01: qty 2

## 2016-12-01 MED ORDER — ALBUTEROL SULFATE (2.5 MG/3ML) 0.083% IN NEBU
5.0000 mg | INHALATION_SOLUTION | Freq: Once | RESPIRATORY_TRACT | Status: AC
  Administered 2016-12-01: 5 mg via RESPIRATORY_TRACT

## 2016-12-01 MED ORDER — SODIUM CHLORIDE 0.9% FLUSH
3.0000 mL | Freq: Two times a day (BID) | INTRAVENOUS | Status: DC
  Administered 2016-12-01 – 2016-12-02 (×3): 3 mL via INTRAVENOUS

## 2016-12-01 MED ORDER — ASPIRIN EC 81 MG PO TBEC
81.0000 mg | DELAYED_RELEASE_TABLET | Freq: Every day | ORAL | Status: DC
  Administered 2016-12-01 – 2016-12-02 (×2): 81 mg via ORAL
  Filled 2016-12-01: qty 1

## 2016-12-01 MED ORDER — IPRATROPIUM BROMIDE 0.02 % IN SOLN
0.5000 mg | Freq: Once | RESPIRATORY_TRACT | Status: AC
  Administered 2016-12-01: 0.5 mg via RESPIRATORY_TRACT
  Filled 2016-12-01: qty 2.5

## 2016-12-01 MED ORDER — PHENOL 1.4 % MT LIQD
1.0000 | OROMUCOSAL | Status: DC | PRN
  Administered 2016-12-01: 1 via OROMUCOSAL
  Filled 2016-12-01: qty 177

## 2016-12-01 MED ORDER — PANTOPRAZOLE SODIUM 40 MG PO TBEC
40.0000 mg | DELAYED_RELEASE_TABLET | Freq: Every day | ORAL | Status: DC
  Administered 2016-12-01 – 2016-12-02 (×2): 40 mg via ORAL
  Filled 2016-12-01 (×2): qty 1

## 2016-12-01 MED ORDER — UMECLIDINIUM BROMIDE 62.5 MCG/INH IN AEPB
1.0000 | INHALATION_SPRAY | Freq: Every day | RESPIRATORY_TRACT | Status: DC
  Administered 2016-12-01 – 2016-12-02 (×2): 1 via RESPIRATORY_TRACT
  Filled 2016-12-01: qty 7

## 2016-12-01 MED ORDER — ACETAMINOPHEN 650 MG RE SUPP: 650.0000 mg | Freq: Four times a day (QID) | RECTAL | Status: DC | PRN

## 2016-12-01 MED ORDER — NICOTINE 21 MG/24HR TD PT24
21.0000 mg | MEDICATED_PATCH | Freq: Every day | TRANSDERMAL | Status: DC
  Administered 2016-12-01 – 2016-12-02 (×2): 21 mg via TRANSDERMAL
  Filled 2016-12-01 (×2): qty 1

## 2016-12-01 MED ORDER — ENOXAPARIN SODIUM 40 MG/0.4ML ~~LOC~~ SOLN
40.0000 mg | SUBCUTANEOUS | Status: DC
  Administered 2016-12-01 – 2016-12-02 (×2): 40 mg via SUBCUTANEOUS
  Filled 2016-12-01 (×2): qty 0.4

## 2016-12-01 MED ORDER — FLUTICASONE FUROATE-VILANTEROL 100-25 MCG/INH IN AEPB
1.0000 | INHALATION_SPRAY | Freq: Every day | RESPIRATORY_TRACT | Status: DC
  Administered 2016-12-01 – 2016-12-02 (×2): 1 via RESPIRATORY_TRACT
  Filled 2016-12-01: qty 28

## 2016-12-01 MED ORDER — IPRATROPIUM-ALBUTEROL 0.5-2.5 (3) MG/3ML IN SOLN
3.0000 mL | Freq: Four times a day (QID) | RESPIRATORY_TRACT | Status: DC
  Administered 2016-12-01 – 2016-12-02 (×5): 3 mL via RESPIRATORY_TRACT
  Filled 2016-12-01 (×5): qty 3

## 2016-12-01 MED ORDER — MAGNESIUM SULFATE 2 GM/50ML IV SOLN
2.0000 g | Freq: Once | INTRAVENOUS | Status: AC
Start: 2016-12-01 — End: 2016-12-01
  Administered 2016-12-01: 2 g via INTRAVENOUS
  Filled 2016-12-01: qty 50

## 2016-12-01 MED ORDER — ALBUTEROL (5 MG/ML) CONTINUOUS INHALATION SOLN
10.0000 mg/h | INHALATION_SOLUTION | Freq: Once | RESPIRATORY_TRACT | Status: AC
Start: 2016-12-01 — End: 2016-12-01
  Administered 2016-12-01: 10 mg/h via RESPIRATORY_TRACT

## 2016-12-01 MED ORDER — LEVALBUTEROL HCL 1.25 MG/0.5ML IN NEBU
1.2500 mg | INHALATION_SOLUTION | RESPIRATORY_TRACT | Status: DC | PRN
  Administered 2016-12-02: 1.25 mg via RESPIRATORY_TRACT
  Filled 2016-12-01: qty 0.5

## 2016-12-01 MED ORDER — BUPROPION HCL ER (XL) 150 MG PO TB24
150.0000 mg | ORAL_TABLET | Freq: Every day | ORAL | Status: DC
  Administered 2016-12-01 – 2016-12-02 (×2): 150 mg via ORAL
  Filled 2016-12-01 (×2): qty 1

## 2016-12-01 MED ORDER — ASPIRIN 81 MG PO CHEW
CHEWABLE_TABLET | ORAL | Status: AC
  Administered 2016-12-01: 81 mg
  Filled 2016-12-01: qty 1

## 2016-12-01 MED ORDER — POLYETHYLENE GLYCOL 3350 17 G PO PACK: 17.0000 g | PACK | Freq: Every day | ORAL | Status: DC | PRN

## 2016-12-01 MED ORDER — LORATADINE 10 MG PO TABS
10.0000 mg | ORAL_TABLET | Freq: Every day | ORAL | Status: DC
  Administered 2016-12-02: 10 mg via ORAL
  Filled 2016-12-01: qty 1

## 2016-12-01 MED ORDER — ALBUTEROL (5 MG/ML) CONTINUOUS INHALATION SOLN
10.0000 mg/h | INHALATION_SOLUTION | Freq: Once | RESPIRATORY_TRACT | Status: AC
Start: 2016-12-01 — End: 2016-12-01
  Administered 2016-12-01: 10 mg/h via RESPIRATORY_TRACT
  Filled 2016-12-01: qty 20

## 2016-12-01 MED ORDER — IBUPROFEN 600 MG PO TABS
600.0000 mg | ORAL_TABLET | Freq: Every day | ORAL | Status: DC
  Administered 2016-12-01: 600 mg via ORAL
  Filled 2016-12-01: qty 3

## 2016-12-01 MED ORDER — LEVOFLOXACIN 500 MG PO TABS
500.0000 mg | ORAL_TABLET | Freq: Every day | ORAL | Status: DC
  Administered 2016-12-01 – 2016-12-02 (×2): 500 mg via ORAL
  Filled 2016-12-01 (×3): qty 1

## 2016-12-01 MED ORDER — ALBUTEROL SULFATE (2.5 MG/3ML) 0.083% IN NEBU
INHALATION_SOLUTION | RESPIRATORY_TRACT | Status: AC
  Filled 2016-12-01: qty 6

## 2016-12-01 MED ORDER — GABAPENTIN 300 MG PO CAPS
300.0000 mg | ORAL_CAPSULE | Freq: Every day | ORAL | Status: DC
  Administered 2016-12-01: 300 mg via ORAL
  Filled 2016-12-01: qty 1

## 2016-12-01 MED ORDER — PREDNISONE 20 MG PO TABS
40.0000 mg | ORAL_TABLET | Freq: Every day | ORAL | Status: DC
  Administered 2016-12-01 – 2016-12-02 (×2): 40 mg via ORAL
  Filled 2016-12-01 (×2): qty 2

## 2016-12-01 MED ORDER — ATORVASTATIN CALCIUM 40 MG PO TABS
40.0000 mg | ORAL_TABLET | Freq: Every day | ORAL | Status: DC
  Administered 2016-12-01 – 2016-12-02 (×2): 40 mg via ORAL
  Filled 2016-12-01 (×2): qty 1

## 2016-12-01 MED ORDER — ACETAMINOPHEN 325 MG PO TABS: 650.0000 mg | ORAL_TABLET | Freq: Four times a day (QID) | ORAL | Status: DC | PRN

## 2016-12-01 MED ORDER — FLUTICASONE-UMECLIDIN-VILANT 100-62.5-25 MCG/INH IN AEPB: 1.0000 | INHALATION_SPRAY | Freq: Every day | RESPIRATORY_TRACT | Status: DC

## 2016-12-01 MED ORDER — LISINOPRIL 10 MG PO TABS
10.0000 mg | ORAL_TABLET | Freq: Every day | ORAL | Status: DC
  Administered 2016-12-01 – 2016-12-02 (×2): 10 mg via ORAL
  Filled 2016-12-01 (×2): qty 1

## 2016-12-01 NOTE — Progress Notes (Signed)
Pt asking to be fed - text page to [facility] medicine asking if Pt. Can eat.

## 2016-12-01 NOTE — Progress Notes (Signed)
Pt arrived from ED on Bipap - ED stated that bed bugs were observed pt on precautions - 1 bug found under patient on the bed pad - Informed patient about findings. He said that the people he rents a room from are aware and that they have had a problem before Pt belongings are double bagged and placed in a Hazardous red bag as per policy

## 2016-12-01 NOTE — ED Notes (Signed)
Pt requesting something to drink. Pt informed that due to the fact that he is on bi-pap that he was not allowed to get anything to drink. Pt get very unset stating that we needed to give him something to drink because his mouth is so dry. Pt informed that if he were to receive anything P.O he could potentially aspirate due to the bi-pap machine therefore we are unable to give him anything to drink. Pt stated that if we don't give him anything he will just drink from the sink faucet. Pt again reminded that because he is on bi-pap we are not allowed to give him anything P.O.   RN notified.
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
## 2016-12-01 NOTE — ED Notes (Signed)
Pt continuing to take off continuous neb

## 2016-12-01 NOTE — ED Triage Notes (Addendum)
Pt to ED from home c/o SOB x 2 days along with intermittent cough, non-productive. Pt denies fevers/chills. Hx lung cancer and COPD, not on O2 at home. Expiratory wheezing heard bilaterally, resp labored with any kind of movement. Pt denies CP/dizziness. Skin warm/dry.

## 2016-12-01 NOTE — Progress Notes (Signed)
Family Medicine Progress Note  Saw and examined patient. Was on BiPAP and tolerating well. Still with coarse breath sounds bilaterally. Satting well up to 98%. Said he was hungry with no other complaints. Will continue current plan per H&P. Will continue to check on.  Guadalupe Dawn MD PGY-1 [facility] Medicine Residency
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
## 2016-12-01 NOTE — Progress Notes (Signed)
Pt keeps asking for Bipap to be off - Given respiratory treatment and placed on 40 % mask and given some am Meds - tolerated well 02 sat 99 %

## 2016-12-01 NOTE — Progress Notes (Signed)
Initial Nutrition Assessment  DOCUMENTATION CODES:  Not applicable  INTERVENTION:  Ensure Enlive po BID, each supplement provides 350 kcal and 20 grams of protein  Gave patient a menu and showed what is to be served today and what other options he has available.   NUTRITION DIAGNOSIS:  Increased nutrient needs related to chronic illness (COPD) as evidenced by estimated nutritional requirements for this disease state  GOAL:  Patient will meet greater than or equal to 90% of their needs  MONITOR:  PO intake, Supplement acceptance, Diet advancement, Labs  REASON FOR ASSESSMENT:  Consult COPD Protocol  ASSESSMENT:  61 y/o male PMHx SCLC (resolved 2008), HTN, PAD, GERD COPD, ongoing tobacco abuse, Hep C (resolved), CAD. Presented with severe SOB since Wednesday. Also with ongoing urinary/fecal incontinence with concern for spine lesions from cancer recurrence. Admitted for COPD exacerbation  Patient seen without BIPAP. He is only on simple mask. He is anxiously awaiting diet advancement.   He states that at home, he was eating well. He denies any decrease or changes in his intake due to his recent SOB. That said, he admits he only eats once a day though This is a long standing habit and his normal. He did not drink any supplements nor did he ever take vitamins or minerals.   Pt denies any n/v/c/d.   He reports his UBW is 158 lbs. He does not believe he has had any weight changes and denies his clothes fitting any differently. Per chart, pt's weight is gradually trending down. He has lost roughly 5 lbs in the last couple months and  ~10 lbs in the last year. UBW appears to be 160-165 lbs. This degree of loss is not clinically significant.    Physical Exam: Maybe mild muscle wasting of clavicular musculature. Nothing clinically significant  At this time, he has a good appetite and wants to eat. RD took food requests for when his diet is advanced. Also showed him the menu and what foods  are available to order. Monitor oral intake.   Meds: HCTZ, PPI, Prednisone, ABx Labs: Glu: 130   Recent Labs Lab 12/01/16 0130  NA 136  K 4.0  CL 103  CO2 25  BUN 12  CREATININE 1.03  CALCIUM 9.1  GLUCOSE 128*   Diet Order:  Diet Heart Room service appropriate? Yes; Fluid consistency: Thin  Skin:  Reviewed, no issues  Last BM:  Unknown  Height:  Ht Readings from Last 1 Encounters:  12/01/16 5\' 7"  (1.702 m)   Weight:  Wt Readings from Last 1 Encounters:  12/01/16 154 lb 12.2 oz (70.2 kg)   Wt Readings from Last 10 Encounters:  12/01/16 154 lb 12.2 oz (70.2 kg)  11/23/16 157 lb 6.4 oz (71.4 kg)  09/27/16 160 lb 12.8 oz (72.9 kg)  09/21/16 159 lb (72.1 kg)  09/21/16 159 lb (72.1 kg)  09/11/16 164 lb 12.8 oz (74.8 kg)  08/24/16 160 lb (72.6 kg)  08/15/16 160 lb (72.6 kg)  08/03/16 159 lb (72.1 kg)  07/20/16 155 lb 12.8 oz (70.7 kg)   Ideal Body Weight:  67.27 kg  BMI:  Body mass index is 24.24 kg/m.  Estimated Nutritional Needs:  Kcal:  1800-2050 kcals (26-29 kcal/kg bw) Protein:  85-100 g Pro (1.2-1.4 g/kg bw) Fluid:  1.8-2 L (1 ml/kcal)  EDUCATION NEEDS:  No education needs identified at this time  Burtis Junes RD, LDN, [facility] Pager: [phone] 12/01/2016 10:17 AM

## 2016-12-02 LAB — CBC
HCT: 28 % — ABNORMAL LOW (ref 39.0–52.0)
Hemoglobin: 7.9 g/dL — ABNORMAL LOW (ref 13.0–17.0)
MCH: 20.3 pg — AB (ref 26.0–34.0)
MCHC: 28.2 g/dL — ABNORMAL LOW (ref 30.0–36.0)
MCV: 72 fL — AB (ref 78.0–100.0)
PLATELETS: 270 10*3/uL (ref 150–400)
RBC: 3.89 MIL/uL — AB (ref 4.22–5.81)
RDW: 17.9 % — AB (ref 11.5–15.5)
WBC: 7 10*3/uL (ref 4.0–10.5)

## 2016-12-02 LAB — BASIC METABOLIC PANEL
Anion gap: 7 (ref 5–15)
BUN: 16 mg/dL (ref 6–20)
CALCIUM: 9.2 mg/dL (ref 8.9–10.3)
CO2: 27 mmol/L (ref 22–32)
CREATININE: 0.9 mg/dL (ref 0.61–1.24)
Chloride: 102 mmol/L (ref 101–111)
GFR calc Af Amer: >60 mL/min (ref >60)
GLUCOSE: 109 mg/dL — AB (ref 65–99)
POTASSIUM: 4.1 mmol/L (ref 3.5–5.1)
SODIUM: 136 mmol/L (ref 135–145)

## 2016-12-02 LAB — HIV ANTIBODY (ROUTINE TESTING W REFLEX): HIV SCREEN 4TH GENERATION: NONREACTIVE

## 2016-12-02 MED ORDER — FERROUS SULFATE 325 (65 FE) MG PO TABS: 325.0000 mg | ORAL_TABLET | Freq: Every day | ORAL | 0 refills | Status: DC

## 2016-12-02 MED ORDER — IPRATROPIUM-ALBUTEROL 0.5-2.5 (3) MG/3ML IN SOLN: 3.0000 mL | Freq: Three times a day (TID) | RESPIRATORY_TRACT | Status: DC

## 2016-12-02 MED ORDER — PNEUMOCOCCAL VAC POLYVALENT 25 MCG/0.5ML IJ INJ: 0.5000 mL | INJECTION | INTRAMUSCULAR | Status: DC | PRN

## 2016-12-02 MED ORDER — FERROUS SULFATE 325 (65 FE) MG PO TABS
325.0000 mg | ORAL_TABLET | Freq: Every day | ORAL | Status: DC
  Administered 2016-12-02: 325 mg via ORAL
  Filled 2016-12-02: qty 1

## 2016-12-02 MED ORDER — PREDNISONE 20 MG PO TABS: 40.0000 mg | ORAL_TABLET | Freq: Every day | ORAL | 0 refills | Status: DC

## 2016-12-02 MED ORDER — LEVOFLOXACIN 500 MG PO TABS: 500.0000 mg | ORAL_TABLET | Freq: Every day | ORAL | 0 refills | Status: DC

## 2016-12-02 MED ORDER — FERROUS SULFATE 325 (65 FE) MG PO TABS: 325.0000 mg | ORAL_TABLET | Freq: Two times a day (BID) | ORAL | Status: DC

## 2016-12-02 NOTE — Progress Notes (Signed)
Transfer Note:    Arrival Method:  Bed Mental Orientation: Alert & Oriented x 4 Assessment: In progress Skin: Intact IV: SL Pain: 0/10 Safety Measures: Side rails up and call bel in reach  6E Orientation: Completed Family:  N/A Orders have been reviewed and implemented.  Will continue to monitor the patient.

## 2016-12-02 NOTE — Progress Notes (Addendum)
Patient was transferred to [facility] per transfer order. Hand off report provided to [facility]. Patient has confirmed bed bugs and was transported by wheelchair with belongings double bagged in red hazard bag as per policy. Clothing, cell phone, and dentures.  Per EVS, [employer] will arrive for reinspection room prior to cleaning by EVS at approximately 1500 this afternoon. Admission Coordinator notified

## 2016-12-02 NOTE — Progress Notes (Signed)
Family Medicine Teaching Service Daily Progress Note Intern Pager: [phone]  Patient name: [name] Medical record number: [id number] Date of birth: 12/18/1955 Age: 61 y.o. Gender: male  Primary Care Provider: Zenia Resides, MD Consultants: None Code Status: DNR/DNI  Pt Overview and Major Events to Date:  07/06 Admit to FPTS  Assessment and Plan: [name] is a 61 y.o. male presenting with shortness of breath since Wednesday, 7/4. PMH is significant for COPD, small cell lung cancer (resolved, 2008), CVA x 4, Hepatitis C (resolved), PAD, hernia, CAD.  Respiratory Distress d/t COPD exacerbation: Presented with O2 sat 93% with wheezing on RA and initially required BiPAP to maintain sats in mid 90s. CXR with bronchial thickening and hyperinflation consistent with known COPD but no infiltrate. Received three doses of continuous albuterol and and two doses of Atrovent along with Mg and IV solumedrol in the ED. Patient denies pleuritic chest pain, gradual onset of SOB, no recent LE edema making PE less likely. COPD exacerbation likely due to admitted non-adherence to controller medication. Has since transitioned from BiPAP to non-rebreather, then to RA overnight. Now maintaining appropriate O2 sats on RA but with continued wheezing.  - Aspiration precautions, continuous pulse ox - Duoneb nebulizer 0.5-2.5 mg/76mL QID - Xopenex nebulizer 1.25 hourly PRN for shortness of breath - Prednisone 40 mg daily x5 days (day 2/5) - Levaquin 500 mg daily x 5 days (day 2/5) - Continue home Trilegy 100-62.5-25 mcg/inh daily and Incruse Ellipta   Hypertension: Takes Lisinopril-HCTZ 10-12.5 at home, although reports non-compliance. BP 148/84 this AM. - Lisinopril 10 mg daily - HCTZ 12.5 mg daily  Microcytic Anemia:  Hgb on admission 8.0, down from baseline of about 10.  Urinalysis on 6/28 showed moderate blood in the urine, concerning due to h/o lung cancer and active smoker. Repeat UA on  admission with no blood. Patient denies active bleeding. Hgb stable at 7.9 this AM. Iron, ferritin and saturation ratios low, making anemia likely 2/2 iron deficiency - Begin iron supplementation - ferrous sulfate tabs 325mg  BID WC - Continue home Miralax to prevent constipation  Urinary and Fecal Incontinence:  Reports that he often cannot get to the bathroom in time to urinate and have bowel movements.  He denies diarrhea or bloody stool.  Dr. Andria Frames ordered outpatient MRI of spine d/t concern for spine lesions from possible lung cancer recurrence. - Outpatient MRI as planned  Neuropathy: - Continue gabapentin 300 mg daily  Depression: - Continue bupropion 150 mg daily  Atherosclerosis: - Continue Lipitor 40 mg daily  Hx of CVA: - continue ASA 81 mg daily  Tobacco Abuse: - Nicotine patch 21 mg   FEN/GI: heart healthy diet, protonix Prophylaxis: lovenox  Disposition: home  Subjective:  Patient transitioned off NRB last night. Says he has been on RA throughout the night and has no SOB. Still endorsing some wheezing but says this is not new. Would like to go home today. Is very motivated to quit smoking and asking what he can do to prevent his COPD from worsening.   Objective: Temp:  [97.3 F (36.3 C)-98.2 F (36.8 C)] 97.3 F (36.3 C) (07/07 0756) Pulse Rate:  [87-101] 87 (07/07 0727) Resp:  [20-31] 27 (07/07 0727) BP: (118-151)/(62-84) 148/84 (07/07 0854) SpO2:  [93 %-100 %] 100 % (07/07 0824) FiO2 (%):  [40 %] 40 % (07/06 1156) Physical Exam: General: laying in bed in NAD Cardiovascular: RRR, no murmurs appreciated Respiratory: normal WOB on RA, wheezes bilaterally Abdomen: soft, non-tender, non-distended, +  BS Extremities: no LE edema Neuro: A&Ox4, no focal deficits Psych: appropriate mood and affect  Laboratory:  Recent Labs Lab 12/01/16 0130 12/01/16 1000 12/02/16 0554  WBC 5.0 4.1 7.0  HGB 8.0* 7.6* 7.9*  HCT 28.1* 26.9* 28.0*  PLT 237 261 270     Recent Labs Lab 12/01/16 0130 12/01/16 1000 12/02/16 0554  NA 136 137 136  K 4.0 4.1 4.1  CL 103 105 102  CO2 25 23 27   BUN 12 11 16   CREATININE 1.03 0.92 0.90  CALCIUM 9.1 9.0 9.2  GLUCOSE 128* 182* 109*    Imaging/Diagnostic Tests: No results found.   Verner Mould, MD 12/02/2016, 9:33 AM PGY-3, [facility] Intern pager: [phone], text pages welcome

## 2016-12-02 NOTE — Progress Notes (Signed)
Date: 12/02/2016 Patient: [name] Admitted: 12/01/2016  1:36 AM Attending Provider: Zenia Resides, MD  Irma Newness  has made the decision for the  to leave [facility] against the advice of Hensel, Jamal Collin, MD.  He or his authorized caregiver has been informed and understands the inherent risks, including death.  He  has decided to accept the responsibility for this decision. Irma Newness and all necessary parties have been advised that he may return for further evaluation or treatment. His condition at time of discharge was Stable.  Irma Newness had current vital signs as follows:  Blood pressure 119/65, pulse (!) 105, temperature 97.6 F (36.4 C), temperature source Oral, resp. rate 20, height 5\' 7"  (1.702 m), weight 71.4 kg (157 lb 6.5 oz), SpO2 95 %.   Quadry Kampa has signed the Leaving Against Medical Advice form prior to leaving [place] 12/02/2016

## 2016-12-02 NOTE — Discharge Summary (Signed)
[facility] Discharge Summary  Patient name: [name] Medical record number: [id number] Date of birth: 11/14/1955 Age: 61 y.o. Gender: male Date of Admission: 12/01/2016  Date of Discharge: 12/02/16 (LEFT AMA) Admitting Physician: Zenia Resides, MD  Primary Care Provider: Zenia Resides, MD Consultants: None  Indication for Hospitalization: COPD exacerbation  Discharge Diagnoses/Problem List:  Patient Active Problem List   Diagnosis Date Noted  . Dyspnea 12/01/2016  . Acute respiratory failure with hypoxia ([place])   . 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 Cough 11/23/2016  . Allergic conjunctivitis 09/11/2016  . Olecranon bursitis of left elbow 05/10/2016  . Decreased hearing of right ear 02/16/2016  . Small cell lung cancer ([place])   . Peripheral arterial disease ([place])   . GERD (gastroesophageal reflux disease)   . Hepatitis C, chronic ([place]) 06/16/2015  . Major depressive disorder, single episode, moderate ([place]) 04/09/2015  . Pain of right lower leg 04/08/2015  . Onychomycosis 03/04/2014  . Hyperglycemia 10/17/2013  . Unspecified hereditary and idiopathic peripheral neuropathy 10/17/2013  . Urinary incontinence 12/26/2012  . Erectile dysfunction 03/07/2012  . COPD exacerbation ([place]) 05/19/2011  . History of CVA (cerebrovascular accident) 12/28/2010  . Hypercholesteremia 12/28/2010  . Hypertension 12/28/2010  . Other chest pain 10/14/2010  . Allergic rhinitis 09/09/2010  . MIGRAINE HEADACHE 01/11/2010  . Tobacco use disorder 09/10/2009  . LOW BACK PAIN 11/27/2008  . COLONIC POLYPS, ADENOMATOUS, HX OF 02/24/2008  . ESOPHAGEAL REFLUX 02/07/2008  . NECK PAIN 11/15/2007  . RENAL CALCULUS, RIGHT 04/08/2007  . COPD, moderate ([place]) 08/13/2006  . INSOMNIA NOS 07/26/2006   Disposition: Home (left AMA)  Discharge Condition: Stable  Discharge Exam:  General: laying in bed in NAD Cardiovascular: RRR, no murmurs  appreciated Respiratory: normal WOB on RA, wheezes bilaterally Abdomen: soft, non-tender, non-distended, +BS Extremities: no LE edema Neuro: A&Ox4, no focal deficits Psych: appropriate mood and affect  Brief Hospital Course:  Patient admitted due to SOB beginning two days prior to admission. Also reported cough for one week productive of black but not bloody sputum. Patient has history of lung cancer and COPD, but reported he had never has that degree of breathing difficulty in the past. Did report that he had been non-compliant with home COPD meds prior to admission. He received continuous albuterol, Atrovent, Mg, and IV solumedrol in ED with some improvement, however was placed on BiPAP due to difficulty maintaining appropriate O2 sats. Respiratory status significantly improved on BiPAP. He was subsequently admitted for further management.  Given recent COPD med non-compliance, as well as improvement with albuterol, Atrovent, and steroids, SOB felt most likely due to COPD exacerbation. He was started on prednisone 40mg  and levaquin 500mg , both for 5 day courses. He was able to transition to non-rebreather during first day of admission, then to [place].  Patient was able to transition to RA on first night of admission, however required up to 6L [place] again intermittently on second day of admission. Patient became frustrated that he was not cleared to leave on second day, and chose to leave AMA. Risks of leaving AMA, including worsening of his COPD exacerbation and recurrent respiratory distress possibly requiring supplemental O2, were explained to patient and he signed AMA papers. Prednisone and levaquin were prescribed for patient prior to leaving.   Issues for Follow Up:  1. Prescription sent in for home iron supplementation (ferrous sulfate 325mg  qd) before patient left as microcytic anemia and low iron consistent with iron-deficiency anemia. Consider rechecking Hgb/iron few  weeks after discharge to  determine need for continued iron supplementation.  2. Patient very motivated to stop smoking. Has tried Chantix and Wellbutrin in past but is amenable to trying Wellbutrin again.  3. Patient with continued wheezing after improvement in respiratory status. Will likely improve with discontinuation of smoking, but could consider adjusting COPD med regimen if no improvement.  4. Patient left on day 2 of 5 of prednisone 40mg  qd, and day 2 of 5 of levaquin 500mg  qd.  Significant Procedures: None  Significant Labs and Imaging:   Recent Labs Lab 12/01/16 0130 12/01/16 1000 12/02/16 0554  WBC 5.0 4.1 7.0  HGB 8.0* 7.6* 7.9*  HCT 28.1* 26.9* 28.0*  PLT 237 261 270    Recent Labs Lab 12/01/16 0130 12/01/16 1000 12/02/16 0554  NA 136 137 136  K 4.0 4.1 4.1  CL 103 105 102  CO2 25 23 27   GLUCOSE 128* 182* 109*  BUN 12 11 16   CREATININE 1.03 0.92 0.90  CALCIUM 9.1 9.0 9.2    Results/Tests Pending at Time of Discharge: None  Discharge Medications:  Allergies as of 12/02/2016   No Known Allergies     Medication List    STOP taking these medications   buPROPion 150 MG 24 hr tablet Commonly known as:  WELLBUTRIN XL     TAKE these medications   albuterol 108 (90 Base) MCG/ACT inhaler Commonly known as:  PROVENTIL HFA;VENTOLIN HFA Inhale 2 puffs into the lungs every 6 (six) hours as needed for wheezing or shortness of breath. Only use with chest tightness.   atorvastatin 40 MG tablet Commonly known as:  LIPITOR take 1 tablet by mouth daily   cetirizine 10 MG tablet Commonly known as:  ZYRTEC Take 1 tablet (10 mg total) by mouth daily.   ferrous sulfate 325 (65 FE) MG tablet Take 1 tablet (325 mg total) by mouth daily with breakfast. Start taking on:  12/03/2016   Fluticasone-Umeclidin-Vilant 100-62.5-25 MCG/INH Aepb Commonly known as:  TRELEGY ELLIPTA Inhale 1 puff into the lungs daily.   gabapentin 300 MG capsule Commonly known as:  NEURONTIN TAKE 1 CAPSULE BY MOUTH  AT BEDTIME What changed:  See the new instructions.   hydroxypropyl methylcellulose / hypromellose 2.5 % ophthalmic solution Commonly known as:  ISOPTO TEARS / GONIOVISC Place 1 drop into both eyes 3 (three) times daily as needed for dry eyes.   ibuprofen 800 MG tablet Commonly known as:  ADVIL,MOTRIN take 1 tablet by mouth every 8 hours if needed pain What changed:  See the new instructions.   levofloxacin 500 MG tablet Commonly known as:  LEVAQUIN Take 1 tablet (500 mg total) by mouth daily. Start taking on:  12/03/2016   lisinopril-hydrochlorothiazide 10-12.5 MG tablet Commonly known as:  PRINZIDE,ZESTORETIC Take 1 tablet by mouth daily.   LORazepam 1 MG tablet Commonly known as:  ATIVAN One tab 1 hour prior to exam.  May repeat 20 minutes before. What changed:  how much to take  how to take this  when to take this  additional instructions   nicotine 21 mg/24hr patch Commonly known as:  NICODERM CQ - dosed in mg/24 hours Place 1 patch (21 mg total) onto the skin daily. May use formulary preferred patch.   Olopatadine HCl 0.2 % Soln Apply 1 drop to eye daily.   omeprazole 20 MG capsule Commonly known as:  PRILOSEC Take 40 mg by mouth daily.   predniSONE 20 MG tablet Commonly known as:  DELTASONE Take 2  tablets (40 mg total) by mouth daily with breakfast. Start taking on:  12/03/2016       Discharge Instructions: Patient left AMA. Risks of leaving AMA were explained to patient.   Follow-Up Appointments: Follow-up Information    Hensel, Jamal Collin, MD. Schedule an appointment as soon as possible for a visit.   Specialty:  Family Medicine Why:  Within 1 week of discharge Contact information: [place] [address] [phone]           Verner Mould, MD 12/02/2016, 5:54 PM PGY-3, [place]

## 2016-12-05 ENCOUNTER — Telehealth: Payer: Self-pay | Admitting: Family Medicine

## 2016-12-05 ENCOUNTER — Telehealth: Payer: Self-pay | Admitting: *Deleted

## 2016-12-05 ENCOUNTER — Encounter: Payer: Self-pay | Admitting: Family Medicine

## 2016-12-05 NOTE — Telephone Encounter (Signed)
Pt came in inquiring about which of the inhalers he should be using.  He had several in the bag and I went to Dr. Nori Riis who was the preceptor and stated that they were all interchangable however he should use one completely before using another and that he should also continue his Albuterol.  I explained this to the pt and he understood.  Katharina Caper, April D, [place]

## 2016-12-06 ENCOUNTER — Telehealth: Payer: Self-pay | Admitting: Family Medicine

## 2016-12-06 NOTE — Telephone Encounter (Signed)
Sched HFU tomorrow with PCP. Pt had difficulty remembering location of building so I gave him the address and described the location to him. - Mesha Guinyard

## 2016-12-07 ENCOUNTER — Ambulatory Visit (INDEPENDENT_AMBULATORY_CARE_PROVIDER_SITE_OTHER): Payer: Medicare Other | Admitting: Family Medicine

## 2016-12-07 ENCOUNTER — Encounter: Payer: Self-pay | Admitting: Family Medicine

## 2016-12-07 DIAGNOSIS — R159 Full incontinence of feces: Secondary | ICD-10-CM | POA: Diagnosis not present

## 2016-12-07 DIAGNOSIS — F17200 Nicotine dependence, unspecified, uncomplicated: Secondary | ICD-10-CM

## 2016-12-07 DIAGNOSIS — J449 Chronic obstructive pulmonary disease, unspecified: Secondary | ICD-10-CM | POA: Diagnosis not present

## 2016-12-07 NOTE — Patient Instructions (Addendum)
The Ventolin inhaler is a rescue medicine - only use it when you are feeling bad, wheezing or short of breath. The Trelegy inhaler is a controller medication.  Use it every day to stay healthy.  It prevents wheezing before it starts.   Great job on not smoking.  Keep it up.  Remember how bad you felt in the hospital.

## 2016-12-08 NOTE — Progress Notes (Signed)
   Subjective:    Patient ID: [name], male    DOB: 1955/06/06, 61 y.o.   MRN: [id number]  HPI FU hospitalization for COPD exac and tobacco abuse.  Issues: 1. COPD.  Feels much better since leaving hospital.  He realizes now how sick he was and how serious his problems are.  Did not bring in any meds except the rescue albuterol inhaler in his pocket.  He does describe using another inhaler, which I hope is his controler.  Finished with steroids and antibiotics. 2. Quit smoking (again).  States highly motivated.  Using patch.  Feels good about his chances for success.  He had previously quit for one year. 3.  Bowel and bladder incontinence.  See previous note.  Not worse.  Has MRIs of spine ordered.  Lorazepam ordered for MRI sedation.    Review of Systems     Objective:   Physical Exam Lungs clear but prolonged exp phase.          Assessment & Plan:

## 2016-12-08 NOTE — Assessment & Plan Note (Signed)
Quit with hospitalization.  Encouraged continued cessation.

## 2016-12-08 NOTE — Assessment & Plan Note (Signed)
Await MRI of spine.

## 2016-12-08 NOTE — Assessment & Plan Note (Signed)
Spent considerable time differentiating rescue inhaler from controller.  Focus is on controller compliance and tobacco cessaation.

## 2016-12-12 ENCOUNTER — Ambulatory Visit
Admission: RE | Admit: 2016-12-12 | Discharge: 2016-12-12 | Disposition: A | Discharge status: Home / Self Care | Payer: Medicare Other | Source: Ambulatory Visit | Attending: Family Medicine | Admitting: Family Medicine

## 2016-12-12 DIAGNOSIS — R159 Full incontinence of feces: Secondary | ICD-10-CM

## 2016-12-12 DIAGNOSIS — M5126 Other intervertebral disc displacement, lumbar region: Secondary | ICD-10-CM | POA: Diagnosis not present

## 2016-12-12 DIAGNOSIS — M5021 Other cervical disc displacement,  high cervical region: Secondary | ICD-10-CM | POA: Diagnosis not present

## 2016-12-14 ENCOUNTER — Telehealth: Payer: Self-pay | Admitting: Family Medicine

## 2016-12-14 NOTE — Telephone Encounter (Signed)
Would like result from recent mri

## 2016-12-29 DIAGNOSIS — Z0100 Encounter for examination of eyes and vision without abnormal findings: Secondary | ICD-10-CM | POA: Diagnosis not present

## 2017-01-03 ENCOUNTER — Ambulatory Visit (INDEPENDENT_AMBULATORY_CARE_PROVIDER_SITE_OTHER): Payer: Medicare HMO | Admitting: Family Medicine

## 2017-01-03 DIAGNOSIS — F17200 Nicotine dependence, unspecified, uncomplicated: Secondary | ICD-10-CM

## 2017-01-03 DIAGNOSIS — R159 Full incontinence of feces: Secondary | ICD-10-CM | POA: Diagnosis not present

## 2017-01-03 DIAGNOSIS — F321 Major depressive disorder, single episode, moderate: Secondary | ICD-10-CM

## 2017-01-03 DIAGNOSIS — R32 Unspecified urinary incontinence: Secondary | ICD-10-CM | POA: Diagnosis not present

## 2017-01-04 ENCOUNTER — Encounter: Payer: Self-pay | Admitting: Family Medicine

## 2017-01-05 NOTE — Assessment & Plan Note (Signed)
Stable.  No change in meds.  Encouraged to stay active.

## 2017-01-05 NOTE — Assessment & Plan Note (Signed)
Counseled to quit 

## 2017-01-05 NOTE — Patient Instructions (Signed)
Computer was down so no AVS printed. Discussed tobacco cessation, stay active as an adjunct Rx for depression and FU 1-3 months, will need flu shot.

## 2017-01-05 NOTE — Assessment & Plan Note (Signed)
Stable.  Does not desire meds.

## 2017-01-05 NOTE — Progress Notes (Signed)
   Subjective:    Patient ID: [name], male    DOB: 1955/12/09, 61 y.o.   MRN: [id number]  HPI Multiple issues: 1. Rectal and urinary incontinence.  MRI of spine is neg for spinal cord lesions.  States that the problem is mostly urgency.  For either bowel or bladder, he now knows and has developed coping mechanisms such that at the first urge, he head for the bathroom.  Feels OK about symptoms.  He also states that it is not worth taking another medication to try to make the symptoms better.  2. Continues to smoke.  He has cut back to 1/2 ppd.  No plans at present to quit despite knowing the obvious risks.  3. Depression, chronic stable.  He is sad about his family situation.  Many family members have died.  To varying degrees, he is estranged from the surviving members.  No active SI or HI.  Not a new problem.  I suspect this influences his resistance to complete tobacco cessation.    Review of Systems     Objective:   Physical Exam  Lungs clear Cardiac RRR without m or g Abd benign.        Assessment & Plan:

## 2017-01-10 ENCOUNTER — Encounter: Payer: Self-pay | Admitting: Licensed Clinical Social Worker

## 2017-01-10 NOTE — Progress Notes (Signed)
Total time:15 minutes Type of Service: [place]  Interpretor:No.    SUBJECTIVE: [name] is a 61 y.o. male came to [facility] for assistance with food.  Patient reports he does not receive foodstamps and has no food at home.  He purchases food when he gets his check but it does not last all month.    LIFE CONTEXT:  Family & Social: lives with friends, drives a car. Limited social support.  School/ Work: received disability,   Life changes: none reported  Intervention: Reflective listening, , [employer]   Issues discussed: food options, psychosocial concerns, transportation,  [employer] that provide food monthly.        ASSESSMENT:Patient currently experiencing food insecurities due to not being able to purchase food that will last last all month .   Provided patient with food from [facility] food pantry.  Patient was very selective indicated things he did not each such as  Vegetables, rice, beans, tuna etc.  Provided patient with a cans of soup, reviewed [place] [employer], [place] and weekly community food distributions.    PLAN: Patient will go to [facility] tomorrow for food and understands he can go every Thursday.  [name], LCSW Licensed Clinical Social Worker [place]   [phone] 11:10 AM

## 2017-01-31 ENCOUNTER — Other Ambulatory Visit: Payer: Self-pay | Admitting: Family Medicine

## 2017-03-02 ENCOUNTER — Ambulatory Visit (INDEPENDENT_AMBULATORY_CARE_PROVIDER_SITE_OTHER): Payer: Medicare HMO | Admitting: *Deleted

## 2017-03-02 ENCOUNTER — Ambulatory Visit: Payer: Medicare HMO

## 2017-03-02 DIAGNOSIS — Z23 Encounter for immunization: Secondary | ICD-10-CM

## 2017-03-02 NOTE — Progress Notes (Signed)
Pt presents today for nurse visit because he " wants to check his weight and BP and get a flu shot".  Wt today: 149 (down 7 lbs) BP: 150 / 88 (denies CP / SOB / Nausea)   Pt is concerned about his weight loss and wants to see Dr. Andria Frames.  First available is 04/04/17 (appt made) but pt wants to see "If Dr. Andria Frames wants to see me sooner".  Advised I would send message to MD.   Pt tolerated injection well.  Fleeger, Salome Spotted, CMA

## 2017-03-13 ENCOUNTER — Other Ambulatory Visit: Payer: Self-pay | Admitting: Family Medicine

## 2017-03-21 ENCOUNTER — Other Ambulatory Visit: Payer: Self-pay | Admitting: *Deleted

## 2017-03-21 DIAGNOSIS — I779 Disorder of arteries and arterioles, unspecified: Secondary | ICD-10-CM

## 2017-03-21 DIAGNOSIS — I739 Peripheral vascular disease, unspecified: Secondary | ICD-10-CM

## 2017-04-04 ENCOUNTER — Other Ambulatory Visit: Payer: Self-pay

## 2017-04-04 ENCOUNTER — Ambulatory Visit (INDEPENDENT_AMBULATORY_CARE_PROVIDER_SITE_OTHER): Payer: Medicare HMO | Admitting: Family Medicine

## 2017-04-04 DIAGNOSIS — I10 Essential (primary) hypertension: Secondary | ICD-10-CM | POA: Diagnosis not present

## 2017-04-04 DIAGNOSIS — D509 Iron deficiency anemia, unspecified: Secondary | ICD-10-CM | POA: Diagnosis not present

## 2017-04-04 DIAGNOSIS — R634 Abnormal weight loss: Secondary | ICD-10-CM | POA: Diagnosis not present

## 2017-04-04 DIAGNOSIS — J449 Chronic obstructive pulmonary disease, unspecified: Secondary | ICD-10-CM

## 2017-04-04 DIAGNOSIS — E7800 Pure hypercholesterolemia, unspecified: Secondary | ICD-10-CM

## 2017-04-04 DIAGNOSIS — I639 Cerebral infarction, unspecified: Secondary | ICD-10-CM | POA: Diagnosis not present

## 2017-04-04 DIAGNOSIS — Z8673 Personal history of transient ischemic attack (TIA), and cerebral infarction without residual deficits: Secondary | ICD-10-CM | POA: Diagnosis not present

## 2017-04-04 MED ORDER — ATORVASTATIN CALCIUM 40 MG PO TABS: 40.0000 mg | ORAL_TABLET | Freq: Every day | ORAL | 3 refills | Status: DC

## 2017-04-04 MED ORDER — ASPIRIN EC 81 MG PO TBEC: 81.0000 mg | DELAYED_RELEASE_TABLET | Freq: Every day | ORAL | Status: DC

## 2017-04-04 NOTE — Patient Instructions (Signed)
Stop the ibuprofen.  It may be causing bleeding. You can take Regular tylenol 325 mg take 2 pills, 4 times a day as needed for pain.   I will call will call with the lab test results.  You may need to go back on iron.

## 2017-04-05 ENCOUNTER — Telehealth: Payer: Self-pay

## 2017-04-05 DIAGNOSIS — J449 Chronic obstructive pulmonary disease, unspecified: Secondary | ICD-10-CM

## 2017-04-05 LAB — CBC
HEMATOCRIT: 28.5 % — AB (ref 37.5–51.0)
Hemoglobin: 8.3 g/dL — ABNORMAL LOW (ref 13.0–17.7)
MCH: 21.1 pg — AB (ref 26.6–33.0)
MCHC: 29.1 g/dL — ABNORMAL LOW (ref 31.5–35.7)
MCV: 72 fL — AB (ref 79–97)
PLATELETS: 342 10*3/uL (ref 150–379)
RBC: 3.94 x10E6/uL — AB (ref 4.14–5.80)
RDW: 17.4 % — ABNORMAL HIGH (ref 12.3–15.4)
WBC: 5.9 10*3/uL (ref 3.4–10.8)

## 2017-04-05 LAB — CMP14+EGFR
ALBUMIN: 4.2 g/dL (ref 3.6–4.8)
ALT: 13 IU/L (ref 0–44)
AST: 11 IU/L (ref 0–40)
Albumin/Globulin Ratio: 1.6 (ref 1.2–2.2)
Alkaline Phosphatase: 85 IU/L (ref 39–117)
BUN/Creatinine Ratio: 12 (ref 10–24)
BUN: 11 mg/dL (ref 8–27)
Bilirubin Total: <0.2 mg/dL (ref 0.0–1.2)
CALCIUM: 9.4 mg/dL (ref 8.6–10.2)
CO2: 23 mmol/L (ref 20–29)
CREATININE: 0.9 mg/dL (ref 0.76–1.27)
Chloride: 102 mmol/L (ref 96–106)
GFR, EST AFRICAN AMERICAN: 105 mL/min/{1.73_m2} (ref >59)
GFR, EST NON AFRICAN AMERICAN: 91 mL/min/{1.73_m2} (ref >59)
GLOBULIN, TOTAL: 2.6 g/dL (ref 1.5–4.5)
Glucose: 91 mg/dL (ref 65–99)
Potassium: 4.6 mmol/L (ref 3.5–5.2)
SODIUM: 137 mmol/L (ref 134–144)
TOTAL PROTEIN: 6.8 g/dL (ref 6.0–8.5)

## 2017-04-05 LAB — FERRITIN: Ferritin: 6 ng/mL — ABNORMAL LOW (ref 30–400)

## 2017-04-05 MED ORDER — FERROUS SULFATE 325 (65 FE) MG PO TABS: 325.0000 mg | ORAL_TABLET | Freq: Two times a day (BID) | ORAL | 2 refills | Status: DC

## 2017-04-05 MED ORDER — FLUTICASONE-UMECLIDIN-VILANT 100-62.5-25 MCG/INH IN AEPB: 1.0000 | INHALATION_SPRAY | Freq: Every day | RESPIRATORY_TRACT | 6 refills | Status: DC

## 2017-04-05 NOTE — Telephone Encounter (Signed)
Pt came into the office and asked for a refill of Fluticasone-Umeclidin-Vilant, 1 Puff daily. Please send to [facility] on [address]. Ottis Stain, CMA

## 2017-04-05 NOTE — Addendum Note (Signed)
Addended by: Zenia Resides on: 04/05/2017 04:18 PM   Modules accepted: Orders

## 2017-04-05 NOTE — Telephone Encounter (Signed)
Rx sent.  I worry that insurance will not cover or the co-pay will be too high. We shall see.

## 2017-04-06 ENCOUNTER — Encounter: Payer: Self-pay | Admitting: Family Medicine

## 2017-04-06 NOTE — Assessment & Plan Note (Signed)
Refilled controler.  Not interested in smoking cessation at this time.

## 2017-04-06 NOTE — Assessment & Plan Note (Signed)
Problem has stabilized.

## 2017-04-06 NOTE — Assessment & Plan Note (Signed)
Was not taking cholesterol med or asa.  Told to restart.

## 2017-04-06 NOTE — Progress Notes (Signed)
   Subjective:    Patient ID: [name], male    DOB: 02-25-1956, 61 y.o.   MRN: [id number]  HPI Recheck wt plus multiple problems.   1. Wt loss has stabilized.  He now wonders if it is his poor fitting dentures.  Prior WU has been negative. 2. Iron deficiency anemia.  Did not note last visit because computers down.  Needs FU CBC.  Patient has had a recent colonoscopy.  Has been taking ibuprofen 800 mg - sometimes 2 at bedtime.  This is likely source of presumed chronic GI blood loss. 3. COPD still smokes and not interested in quitting.  During visit he was unclear of his inhalors.  After the visit he called and needed his trelegy refilled.  Suspect he was not taking his controler.      Review of Systems     Objective:   Physical Exam Lungs scattered exp wheeze. abd benign.        Assessment & Plan:

## 2017-04-06 NOTE — Assessment & Plan Note (Signed)
Presumed GI blood loss from ibuprofen.  Stop ibuprofen.  Start taking iron (he was not on.)  Confirmed by cBC and ferritin.

## 2017-05-01 IMAGING — CR DG CHEST 2V
2 series · 2 of 2 positions shown · non-contrast
Comparison: 01/05/2016.

CLINICAL DATA: Shortness of breath.

EXAM:
CHEST  2 VIEW

[w chest pa]
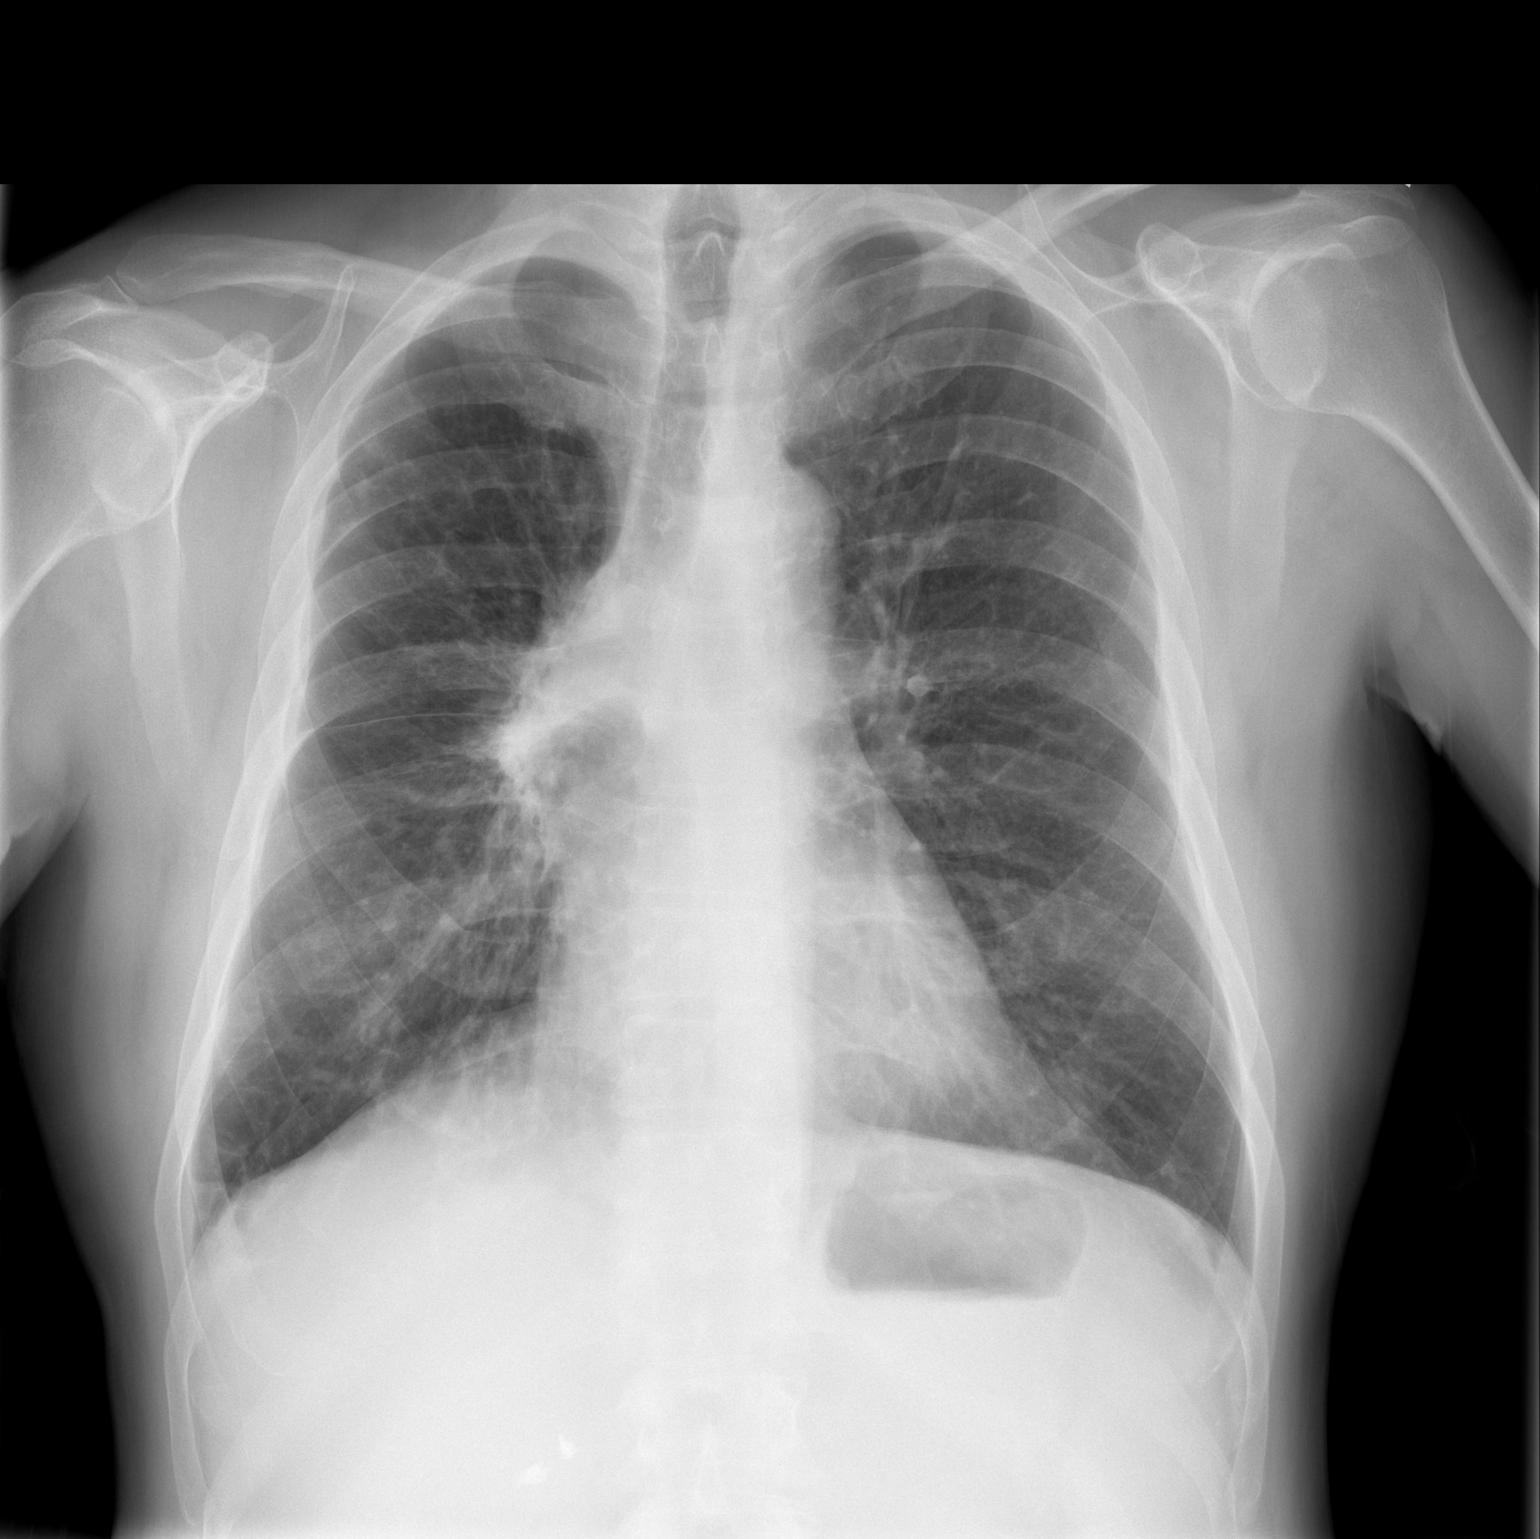

[w chest lat]
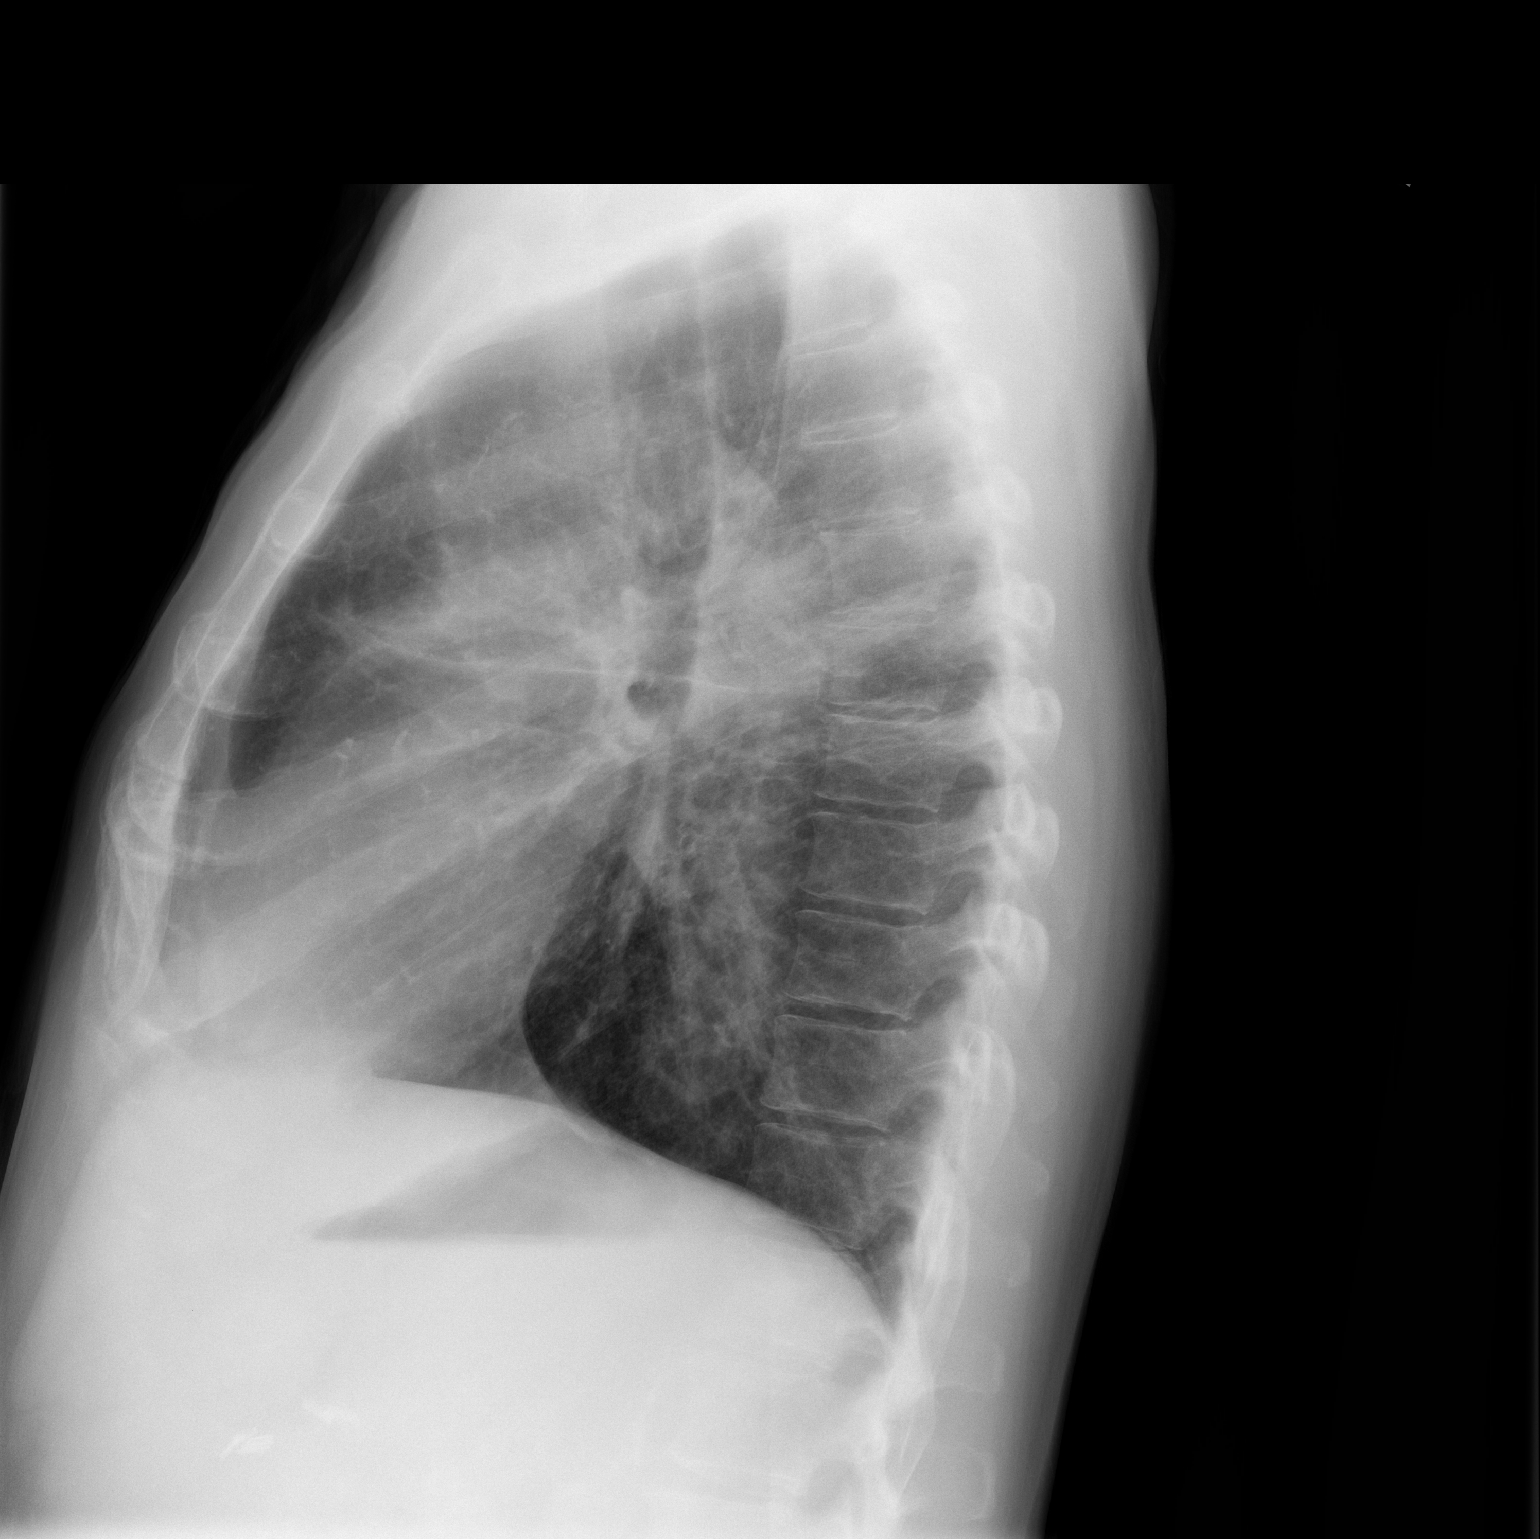

[2 of 2 positions shown; findings below may reference images not displayed]

FINDINGS: Postradiation change again noted about the right perihilar region.
No acute infiltrate. No pleural effusion or pneumothorax. Stable
nodularity in the right upper lobe. Heart size stable. No acute bony
abnormality .
IMPRESSION: Stable postradiation change.  No acute abnormality .

## 2017-05-14 DIAGNOSIS — H1132 Conjunctival hemorrhage, left eye: Secondary | ICD-10-CM | POA: Diagnosis not present

## 2017-05-14 DIAGNOSIS — H04123 Dry eye syndrome of bilateral lacrimal glands: Secondary | ICD-10-CM | POA: Diagnosis not present

## 2017-05-17 ENCOUNTER — Ambulatory Visit (INDEPENDENT_AMBULATORY_CARE_PROVIDER_SITE_OTHER): Payer: Medicare HMO | Admitting: Family Medicine

## 2017-05-17 ENCOUNTER — Encounter: Payer: Self-pay | Admitting: Family Medicine

## 2017-05-17 ENCOUNTER — Other Ambulatory Visit: Payer: Self-pay

## 2017-05-17 DIAGNOSIS — F17200 Nicotine dependence, unspecified, uncomplicated: Secondary | ICD-10-CM

## 2017-05-17 DIAGNOSIS — D509 Iron deficiency anemia, unspecified: Secondary | ICD-10-CM | POA: Diagnosis not present

## 2017-05-17 DIAGNOSIS — Z8673 Personal history of transient ischemic attack (TIA), and cerebral infarction without residual deficits: Secondary | ICD-10-CM

## 2017-05-17 MED ORDER — BUPROPION HCL ER (XL) 150 MG PO TB24: 150.0000 mg | ORAL_TABLET | Freq: Every day | ORAL | 3 refills | Status: DC

## 2017-05-17 NOTE — Patient Instructions (Addendum)
The atorvastatin is a cholesterol lowering pill that prevents heart attacks and strokes.  For sure keep taking it. The baby aspirin is a mild blood thinner to prevent future strokes.  For sure take the baby aspirin. The gabapentin is for leg pain.  Take it if it helps.  Don't take it if it doesn't help.   I will call with the blood test results. I am delighted that you want to quit smoking.  I sent in a new prescription for a medicine that I hope will help you crave less.  You can use the patch and this medicine together.

## 2017-05-18 ENCOUNTER — Encounter: Payer: Self-pay | Admitting: Family Medicine

## 2017-05-18 LAB — CBC
HEMATOCRIT: 43.5 % (ref 37.5–51.0)
HEMOGLOBIN: 14.1 g/dL (ref 13.0–17.7)
MCH: 28.3 pg (ref 26.6–33.0)
MCHC: 32.4 g/dL (ref 31.5–35.7)
MCV: 87 fL (ref 79–97)
Platelets: 269 10*3/uL (ref 150–379)
RBC: 4.99 x10E6/uL (ref 4.14–5.80)
WBC: 6.6 10*3/uL (ref 3.4–10.8)

## 2017-05-18 LAB — FERRITIN: Ferritin: 60 ng/mL (ref 30–400)

## 2017-05-18 MED ORDER — FERROUS SULFATE 325 (65 FE) MG PO TABS: 325.0000 mg | ORAL_TABLET | Freq: Every day | ORAL | 2 refills | Status: DC

## 2017-05-18 MED ORDER — BUPROPION HCL ER (XL) 150 MG PO TB24: 150.0000 mg | ORAL_TABLET | Freq: Every day | ORAL | 3 refills | Status: DC

## 2017-05-18 NOTE — Assessment & Plan Note (Signed)
Add bupropion and strongly encouraged/congratulated.  FU 6 weeks.

## 2017-05-18 NOTE — Assessment & Plan Note (Signed)
Called with lab rsults.  Great response to oral iron.  Continue to replete stores.  Decrease from bid to daily
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
## 2017-05-18 NOTE — Assessment & Plan Note (Signed)
Emphasized need to stay on ASA and atorvastatin for 2nd prevention.

## 2017-05-30 ENCOUNTER — Telehealth: Payer: Self-pay | Admitting: Family Medicine

## 2017-05-30 DIAGNOSIS — F17200 Nicotine dependence, unspecified, uncomplicated: Secondary | ICD-10-CM

## 2017-05-30 MED ORDER — BUPROPION HCL ER (XL) 150 MG PO TB24: 150.0000 mg | ORAL_TABLET | Freq: Every day | ORAL | 3 refills | Status: DC

## 2017-05-30 NOTE — Telephone Encounter (Signed)
Resent electronically and called to pharmacy.  Called patient and let him know Rx sent.

## 2017-05-30 NOTE — Telephone Encounter (Signed)
Patient came to office wanting to know why RX to help him quit smoking has not been called into pharmacy.  [facility] [address]  Please let patient know when complete. [phone]

## 2017-06-21 ENCOUNTER — Ambulatory Visit (INDEPENDENT_AMBULATORY_CARE_PROVIDER_SITE_OTHER): Payer: Medicare HMO | Admitting: Family Medicine

## 2017-06-21 ENCOUNTER — Encounter: Payer: Self-pay | Admitting: Family Medicine

## 2017-06-21 ENCOUNTER — Other Ambulatory Visit: Payer: Self-pay

## 2017-06-21 VITALS — BP 140/72 | HR 74 | Temp 98.2°F | Ht 67.0 in | Wt 153.0 lb

## 2017-06-21 DIAGNOSIS — L298 Other pruritus: Secondary | ICD-10-CM | POA: Diagnosis not present

## 2017-06-21 MED ORDER — TRIAMCINOLONE ACETONIDE 0.5 % EX CREA: 1.0000 "application " | TOPICAL_CREAM | Freq: Three times a day (TID) | CUTANEOUS | 2 refills | Status: DC

## 2017-06-21 NOTE — Patient Instructions (Addendum)
Use Dove soap--good moisterizer (Lubriderm or Eucerin lotion)  Bedbugs Bedbugs are tiny bugs that live in and around beds. They stay hidden during the day, and they come out at night and bite. Bedbugs need blood to live and grow. Where are bedbugs found? Bedbugs can be found anywhere, whether a place is clean or dirty. They are most often found in places where many people come and go, such as hotels, shelters, dorms, and health care settings. It is also common for them to be found in homes where there are many birds or bats nearby. What are bedbug bites like? A bedbug bite leaves a small red bump with a darker red dot in the middle. The bump may appear soon after a person is bitten or a day or more later. Bedbug bites usually do not hurt, but they may itch. Most people do not need treatment for bedbug bites. The bumps usually go away on their own in a few days. How do I check for bedbugs? Bedbugs are reddish-brown, oval, and flat. They range in size from 1 mm to 7 mm and they cannot fly. Look for bedbugs in these places:  On mattresses, bed frames, headboards, and box springs.  On drapes and curtains in bedrooms.  Under carpeting in bedrooms.  Behind electrical outlets.  Behind any wallpaper that is peeling.  Inside luggage.  Also look for black or red spots or stains on or near the bed. Stains can come from bedbugs that have been crushed or from bedbug waste. What should I do if I find bedbugs? When Traveling If you find bedbugs while traveling, check all of your possessions carefully before you bring them into your home. Consider throwing away anything that has bedbugs on it. At Home If you find bedbugs at home, your bedroom may need to be treated by a pest control expert. You may also need to throw away mattresses or luggage. To help keep bedbugs from coming back, consider taking these actions:  Put a plastic cover over your mattress.  Wash your clothes and bedding in water that  is hotter than 120F (48.9C) and dry them on a hot setting. Bedbugs are killed by high temperatures.  Vacuum often around the bed and in all of the cracks and crevices where the bugs might hide.  Carefully check all used furniture, bedding, or clothes that you bring into your home.  Eliminate bird nests and bat roosts that are near your home.  In Your Bed If you find bedbugs in your bed, consider wearing pajamas that have long sleeves and pant legs. Bedbugs usually bite areas of the skin that are not covered. This information is not intended to replace advice given to you by your health care provider. Make sure you discuss any questions you have with your health care provider. Document Released: 06/17/2010 Document Revised: 10/21/2015 Document Reviewed: 05/11/2014 Elsevier Interactive Patient Education  [employer].
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
## 2017-06-22 ENCOUNTER — Encounter: Payer: Self-pay | Admitting: Family Medicine

## 2017-06-28 ENCOUNTER — Other Ambulatory Visit: Payer: Self-pay

## 2017-06-28 ENCOUNTER — Ambulatory Visit (INDEPENDENT_AMBULATORY_CARE_PROVIDER_SITE_OTHER): Payer: Medicare HMO | Admitting: Family Medicine

## 2017-06-28 ENCOUNTER — Encounter: Payer: Self-pay | Admitting: Family Medicine

## 2017-06-28 DIAGNOSIS — J449 Chronic obstructive pulmonary disease, unspecified: Secondary | ICD-10-CM | POA: Diagnosis not present

## 2017-06-28 DIAGNOSIS — K219 Gastro-esophageal reflux disease without esophagitis: Secondary | ICD-10-CM

## 2017-06-28 MED ORDER — OMEPRAZOLE 40 MG PO CPDR: 40.0000 mg | DELAYED_RELEASE_CAPSULE | Freq: Every day | ORAL | 3 refills | Status: DC

## 2017-06-28 NOTE — Progress Notes (Signed)
   Subjective:    Patient ID: [name], male    DOB: 24-Feb-1956, 62 y.o.   MRN: [id number]  HPI Needs refill on omeprazole.  Out x 2 weeks.  Lots of heartburn while off. Wants note for jury duty.  Does not feel he can physically/mentally focus for fair trial.  I agree. COPD no recent flairs.  Breathing at his baseline.    Review of Systems     Objective:   Physical Exam Lungs clear Cardiac RRR without m or g Abd mild epigastric tenderness.       Assessment & Plan:

## 2017-06-28 NOTE — Assessment & Plan Note (Signed)
Stable on current meds 

## 2017-06-28 NOTE — Patient Instructions (Signed)
I refilled your omeprazole with a higher dose so you only need to take one pill a day.  I will give you a note for jury duty.

## 2017-06-28 NOTE — Assessment & Plan Note (Signed)
Refilled PPI.  Pills changed but dose remains 40 mg daily.

## 2017-07-21 ENCOUNTER — Emergency Department (HOSPITAL_COMMUNITY): Payer: Medicare HMO

## 2017-07-21 ENCOUNTER — Encounter (HOSPITAL_COMMUNITY): Payer: Self-pay | Admitting: Nurse Practitioner

## 2017-07-21 ENCOUNTER — Inpatient Hospital Stay (HOSPITAL_COMMUNITY)
Admission: EM | Admit: 2017-07-21 | Discharge: 2017-07-26 | DRG: 065 | Disposition: A | Discharge status: Rehabilitation Facility | Payer: Medicare HMO | Attending: Family Medicine | Admitting: Family Medicine

## 2017-07-21 DIAGNOSIS — F17200 Nicotine dependence, unspecified, uncomplicated: Secondary | ICD-10-CM | POA: Diagnosis present

## 2017-07-21 DIAGNOSIS — I69393 Ataxia following cerebral infarction: Secondary | ICD-10-CM | POA: Diagnosis not present

## 2017-07-21 DIAGNOSIS — G629 Polyneuropathy, unspecified: Secondary | ICD-10-CM | POA: Diagnosis present

## 2017-07-21 DIAGNOSIS — Z8673 Personal history of transient ischemic attack (TIA), and cerebral infarction without residual deficits: Secondary | ICD-10-CM | POA: Diagnosis not present

## 2017-07-21 DIAGNOSIS — R51 Headache: Secondary | ICD-10-CM | POA: Diagnosis not present

## 2017-07-21 DIAGNOSIS — K219 Gastro-esophageal reflux disease without esophagitis: Secondary | ICD-10-CM | POA: Diagnosis not present

## 2017-07-21 DIAGNOSIS — J441 Chronic obstructive pulmonary disease with (acute) exacerbation: Secondary | ICD-10-CM | POA: Diagnosis not present

## 2017-07-21 DIAGNOSIS — N179 Acute kidney failure, unspecified: Secondary | ICD-10-CM | POA: Diagnosis not present

## 2017-07-21 DIAGNOSIS — I6389 Other cerebral infarction: Secondary | ICD-10-CM | POA: Diagnosis not present

## 2017-07-21 DIAGNOSIS — Z85118 Personal history of other malignant neoplasm of bronchus and lung: Secondary | ICD-10-CM | POA: Diagnosis not present

## 2017-07-21 DIAGNOSIS — F1011 Alcohol abuse, in remission: Secondary | ICD-10-CM

## 2017-07-21 DIAGNOSIS — G9349 Other encephalopathy: Secondary | ICD-10-CM | POA: Diagnosis not present

## 2017-07-21 DIAGNOSIS — R4182 Altered mental status, unspecified: Secondary | ICD-10-CM | POA: Diagnosis present

## 2017-07-21 DIAGNOSIS — Z79899 Other long term (current) drug therapy: Secondary | ICD-10-CM

## 2017-07-21 DIAGNOSIS — I6381 Other cerebral infarction due to occlusion or stenosis of small artery: Principal | ICD-10-CM | POA: Diagnosis present

## 2017-07-21 DIAGNOSIS — F17210 Nicotine dependence, cigarettes, uncomplicated: Secondary | ICD-10-CM | POA: Diagnosis present

## 2017-07-21 DIAGNOSIS — Z720 Tobacco use: Secondary | ICD-10-CM | POA: Diagnosis present

## 2017-07-21 DIAGNOSIS — R29701 NIHSS score 1: Secondary | ICD-10-CM | POA: Diagnosis present

## 2017-07-21 DIAGNOSIS — Z7952 Long term (current) use of systemic steroids: Secondary | ICD-10-CM

## 2017-07-21 DIAGNOSIS — E7800 Pure hypercholesterolemia, unspecified: Secondary | ICD-10-CM | POA: Diagnosis not present

## 2017-07-21 DIAGNOSIS — Z9221 Personal history of antineoplastic chemotherapy: Secondary | ICD-10-CM | POA: Diagnosis not present

## 2017-07-21 DIAGNOSIS — B182 Chronic viral hepatitis C: Secondary | ICD-10-CM | POA: Diagnosis not present

## 2017-07-21 DIAGNOSIS — R269 Unspecified abnormalities of gait and mobility: Secondary | ICD-10-CM | POA: Diagnosis not present

## 2017-07-21 DIAGNOSIS — H9191 Unspecified hearing loss, right ear: Secondary | ICD-10-CM | POA: Diagnosis present

## 2017-07-21 DIAGNOSIS — R21 Rash and other nonspecific skin eruption: Secondary | ICD-10-CM | POA: Diagnosis present

## 2017-07-21 DIAGNOSIS — J449 Chronic obstructive pulmonary disease, unspecified: Secondary | ICD-10-CM | POA: Diagnosis present

## 2017-07-21 DIAGNOSIS — I2510 Atherosclerotic heart disease of native coronary artery without angina pectoris: Secondary | ICD-10-CM | POA: Diagnosis not present

## 2017-07-21 DIAGNOSIS — I6330 Cerebral infarction due to thrombosis of unspecified cerebral artery: Secondary | ICD-10-CM | POA: Diagnosis not present

## 2017-07-21 DIAGNOSIS — E871 Hypo-osmolality and hyponatremia: Secondary | ICD-10-CM | POA: Diagnosis not present

## 2017-07-21 DIAGNOSIS — I639 Cerebral infarction, unspecified: Secondary | ICD-10-CM | POA: Diagnosis not present

## 2017-07-21 DIAGNOSIS — R451 Restlessness and agitation: Secondary | ICD-10-CM | POA: Diagnosis not present

## 2017-07-21 DIAGNOSIS — Z789 Other specified health status: Secondary | ICD-10-CM | POA: Diagnosis not present

## 2017-07-21 DIAGNOSIS — Z87898 Personal history of other specified conditions: Secondary | ICD-10-CM | POA: Diagnosis not present

## 2017-07-21 DIAGNOSIS — Z86010 Personal history of colonic polyps: Secondary | ICD-10-CM

## 2017-07-21 DIAGNOSIS — E785 Hyperlipidemia, unspecified: Secondary | ICD-10-CM | POA: Diagnosis not present

## 2017-07-21 DIAGNOSIS — K5901 Slow transit constipation: Secondary | ICD-10-CM | POA: Diagnosis not present

## 2017-07-21 DIAGNOSIS — R0602 Shortness of breath: Secondary | ICD-10-CM | POA: Diagnosis not present

## 2017-07-21 DIAGNOSIS — Z8249 Family history of ischemic heart disease and other diseases of the circulatory system: Secondary | ICD-10-CM

## 2017-07-21 DIAGNOSIS — Z923 Personal history of irradiation: Secondary | ICD-10-CM

## 2017-07-21 DIAGNOSIS — R1084 Generalized abdominal pain: Secondary | ICD-10-CM | POA: Diagnosis not present

## 2017-07-21 DIAGNOSIS — I69351 Hemiplegia and hemiparesis following cerebral infarction affecting right dominant side: Secondary | ICD-10-CM | POA: Diagnosis not present

## 2017-07-21 DIAGNOSIS — B1920 Unspecified viral hepatitis C without hepatic coma: Secondary | ICD-10-CM

## 2017-07-21 DIAGNOSIS — R531 Weakness: Secondary | ICD-10-CM | POA: Diagnosis not present

## 2017-07-21 DIAGNOSIS — Z87442 Personal history of urinary calculi: Secondary | ICD-10-CM

## 2017-07-21 DIAGNOSIS — I69398 Other sequelae of cerebral infarction: Secondary | ICD-10-CM | POA: Diagnosis not present

## 2017-07-21 DIAGNOSIS — R7303 Prediabetes: Secondary | ICD-10-CM | POA: Diagnosis present

## 2017-07-21 DIAGNOSIS — R270 Ataxia, unspecified: Secondary | ICD-10-CM | POA: Diagnosis present

## 2017-07-21 DIAGNOSIS — G8191 Hemiplegia, unspecified affecting right dominant side: Secondary | ICD-10-CM | POA: Diagnosis present

## 2017-07-21 DIAGNOSIS — I10 Essential (primary) hypertension: Secondary | ICD-10-CM | POA: Diagnosis present

## 2017-07-21 DIAGNOSIS — K5900 Constipation, unspecified: Secondary | ICD-10-CM | POA: Diagnosis not present

## 2017-07-21 DIAGNOSIS — B888 Other specified infestations: Secondary | ICD-10-CM | POA: Diagnosis not present

## 2017-07-21 DIAGNOSIS — F329 Major depressive disorder, single episode, unspecified: Secondary | ICD-10-CM | POA: Diagnosis not present

## 2017-07-21 DIAGNOSIS — Z801 Family history of malignant neoplasm of trachea, bronchus and lung: Secondary | ICD-10-CM | POA: Diagnosis not present

## 2017-07-21 HISTORY — DX: Calculus of kidney: N20.0

## 2017-07-21 HISTORY — DX: Cervicalgia: M54.2

## 2017-07-21 HISTORY — DX: Pain in right lower leg: M79.661

## 2017-07-21 LAB — CBC
HEMATOCRIT: 46.6 % (ref 39.0–52.0)
HEMOGLOBIN: 15.6 g/dL (ref 13.0–17.0)
MCH: 31.1 pg (ref 26.0–34.0)
MCHC: 33.5 g/dL (ref 30.0–36.0)
MCV: 93 fL (ref 78.0–100.0)
Platelets: 260 10*3/uL (ref 150–400)
RBC: 5.01 MIL/uL (ref 4.22–5.81)
RDW: 14.9 % (ref 11.5–15.5)
WBC: 6.2 10*3/uL (ref 4.0–10.5)

## 2017-07-21 LAB — DIFFERENTIAL
Basophils Absolute: 0 10*3/uL (ref 0.0–0.1)
Basophils Relative: 1 %
EOS ABS: 0.4 10*3/uL (ref 0.0–0.7)
EOS PCT: 6 %
Lymphocytes Relative: 25 %
Lymphs Abs: 1.6 10*3/uL (ref 0.7–4.0)
MONO ABS: 0.4 10*3/uL (ref 0.1–1.0)
MONOS PCT: 7 %
Neutro Abs: 3.8 10*3/uL (ref 1.7–7.7)
Neutrophils Relative %: 61 %

## 2017-07-21 LAB — URINALYSIS, ROUTINE W REFLEX MICROSCOPIC
Bilirubin Urine: NEGATIVE
GLUCOSE, UA: NEGATIVE mg/dL
Hgb urine dipstick: NEGATIVE
Ketones, ur: 5 mg/dL — AB
LEUKOCYTES UA: NEGATIVE
NITRITE: NEGATIVE
PROTEIN: NEGATIVE mg/dL
Specific Gravity, Urine: 1.021 (ref 1.005–1.030)
pH: 6 (ref 5.0–8.0)

## 2017-07-21 LAB — COMPREHENSIVE METABOLIC PANEL
ALT: 20 U/L (ref 17–63)
ANION GAP: 11 (ref 5–15)
AST: 16 U/L (ref 15–41)
Albumin: 4.8 g/dL (ref 3.5–5.0)
Alkaline Phosphatase: 92 U/L (ref 38–126)
BILIRUBIN TOTAL: 0.9 mg/dL (ref 0.3–1.2)
BUN: 19 mg/dL (ref 6–20)
CO2: 26 mmol/L (ref 22–32)
Calcium: 9.7 mg/dL (ref 8.9–10.3)
Chloride: 105 mmol/L (ref 101–111)
Creatinine, Ser: 0.91 mg/dL (ref 0.61–1.24)
GFR calc Af Amer: >60 mL/min (ref >60)
Glucose, Bld: 126 mg/dL — ABNORMAL HIGH (ref 65–99)
POTASSIUM: 4.3 mmol/L (ref 3.5–5.1)
Sodium: 142 mmol/L (ref 135–145)
TOTAL PROTEIN: 8.1 g/dL (ref 6.5–8.1)

## 2017-07-21 LAB — I-STAT CHEM 8, ED
BUN: 19 mg/dL (ref 6–20)
CALCIUM ION: 1.21 mmol/L (ref 1.15–1.40)
Chloride: 102 mmol/L (ref 101–111)
Creatinine, Ser: 0.9 mg/dL (ref 0.61–1.24)
Glucose, Bld: 122 mg/dL — ABNORMAL HIGH (ref 65–99)
HCT: 50 % (ref 39.0–52.0)
Hemoglobin: 17 g/dL (ref 13.0–17.0)
Potassium: 4.2 mmol/L (ref 3.5–5.1)
SODIUM: 140 mmol/L (ref 135–145)
TCO2: 28 mmol/L (ref 22–32)

## 2017-07-21 LAB — RAPID URINE DRUG SCREEN, HOSP PERFORMED
Amphetamines: NOT DETECTED
Barbiturates: NOT DETECTED
Benzodiazepines: NOT DETECTED
COCAINE: NOT DETECTED
Opiates: NOT DETECTED
Tetrahydrocannabinol: NOT DETECTED

## 2017-07-21 LAB — PROTIME-INR
INR: 1
Prothrombin Time: 13.1 seconds (ref 11.4–15.2)

## 2017-07-21 LAB — CBG MONITORING, ED: Glucose-Capillary: 121 mg/dL — ABNORMAL HIGH (ref 65–99)

## 2017-07-21 LAB — I-STAT TROPONIN, ED: TROPONIN I, POC: 0.01 ng/mL (ref 0.00–0.08)

## 2017-07-21 LAB — APTT: aPTT: 34 seconds (ref 24–36)

## 2017-07-21 MED ORDER — LISINOPRIL 10 MG PO TABS
10.0000 mg | ORAL_TABLET | Freq: Once | ORAL | Status: AC
  Administered 2017-07-21: 10 mg via ORAL
  Filled 2017-07-21: qty 1

## 2017-07-21 MED ORDER — IPRATROPIUM BROMIDE 0.02 % IN SOLN
0.5000 mg | Freq: Once | RESPIRATORY_TRACT | Status: AC
  Administered 2017-07-21: 0.5 mg via RESPIRATORY_TRACT
  Filled 2017-07-21: qty 2.5

## 2017-07-21 MED ORDER — ALBUTEROL SULFATE (2.5 MG/3ML) 0.083% IN NEBU
5.0000 mg | INHALATION_SOLUTION | Freq: Once | RESPIRATORY_TRACT | Status: AC
  Administered 2017-07-21: 5 mg via RESPIRATORY_TRACT
  Filled 2017-07-21: qty 6

## 2017-07-21 MED ORDER — HYDROCHLOROTHIAZIDE 12.5 MG PO CAPS
12.5000 mg | ORAL_CAPSULE | Freq: Once | ORAL | Status: AC
  Administered 2017-07-21: 12.5 mg via ORAL
  Filled 2017-07-21: qty 1

## 2017-07-21 NOTE — ED Triage Notes (Signed)
Pt has been presented by house mate who reports patient is "not acting himself." Pt reports being weak, a fall yesterday and is concerned that the symptoms he is experiencing reminds him of the last time he had stroke. Unsure on if he has been taking his blood pressure medicines.

## 2017-07-21 NOTE — ED Notes (Signed)
Patient transported to CT 

## 2017-07-21 NOTE — ED Notes (Signed)
ED TO INPATIENT HANDOFF REPORT  Name/Age/Gender [name] 62 y.o. male  Code Status Code Status History    Date Active Date Inactive Code Status Order ID Comments User Context   12/01/2016 08:54 12/02/2016 20:53 DNR [id number]  Nicolette Bang, DO Inpatient   12/02/2014 17:34 12/03/2014 15:36 DNR [id number]  Vivi Barrack, MD Inpatient    Questions for Most Recent Historical Code Status (Order [id number])    Question Answer Comment   In the event of cardiac or respiratory ARREST Do not call a "code blue"    In the event of cardiac or respiratory ARREST Do not perform Intubation, CPR, defibrillation or ACLS    In the event of cardiac or respiratory ARREST Use medication by any route, position, wound care, and other measures to relive pain and suffering. May use oxygen, suction and manual treatment of airway obstruction as needed for comfort.       Home/SNF/Other Home  Chief Complaint Possible Stroke   Level of Care/Admitting Diagnosis ED Disposition    None      Medical History Past Medical History:  Diagnosis Date  . CAD (coronary artery disease) 06/30/2015  . CVA (cerebral vascular accident) ([place])    x4  . GERD (gastroesophageal reflux disease)   . H/O: lung cancer right side  . Headache   . Hepatitis C infection   . History of blood transfusion 1981  . Hypertension 12/28/2010  . Peripheral arterial disease (HCC)    a. s/p PV angiogram on 07/19/15 with successful mid left SFA chronic total occlusion directional atherectomy followed by drug eluting balloon angioplasty using distal protection.  . Small cell lung cancer ([place]) dx;d 2006   a. s/p chemo/xrt comp to 2006    Allergies No Known Allergies  IV Location/Drains/Wounds Patient Lines/Drains/Airways Status   Active Line/Drains/Airways    None          Labs/Imaging Results for orders placed or performed during the hospital encounter of 07/21/17 (from the past 48 hour(s))  Protime-INR     Status:  None   Collection Time: 07/21/17  8:31 PM  Result Value Ref Range   Prothrombin Time 13.1 11.4 - 15.2 seconds   INR 1.00     Comment: Performed at [facility], [place] [address], [place], [place] [address]  APTT     Status: None   Collection Time: 07/21/17  8:31 PM  Result Value Ref Range   aPTT 34 24 - 36 seconds    Comment: Performed at [facility], [place] [address] [place], [place] [address]  CBC     Status: None   Collection Time: 07/21/17  8:31 PM  Result Value Ref Range   WBC 6.2 4.0 - 10.5 K/uL   RBC 5.01 4.22 - 5.81 MIL/uL   Hemoglobin 15.6 13.0 - 17.0 g/dL   HCT 46.6 39.0 - 52.0 %   MCV 93.0 78.0 - 100.0 fL   MCH 31.1 26.0 - 34.0 pg   MCHC 33.5 30.0 - 36.0 g/dL   RDW 14.9 11.5 - 15.5 %   Platelets 260 150 - 400 K/uL    Comment: Performed at [facility], [place] [address], [place], [place] [address]  Differential     Status: None   Collection Time: 07/21/17  8:31 PM  Result Value Ref Range   Neutrophils Relative % 61 %   Neutro Abs 3.8 1.7 - 7.7 K/uL   Lymphocytes Relative 25 %   Lymphs Abs  1.6 0.7 - 4.0 K/uL   Monocytes Relative 7 %   Monocytes Absolute 0.4 0.1 - 1.0 K/uL   Eosinophils Relative 6 %   Eosinophils Absolute 0.4 0.0 - 0.7 K/uL   Basophils Relative 1 %   Basophils Absolute 0.0 0.0 - 0.1 K/uL    Comment: Performed at [facility], [place] [address] [place], [place] [address]  Comprehensive metabolic panel     Status: Abnormal   Collection Time: 07/21/17  8:31 PM  Result Value Ref Range   Sodium 142 135 - 145 mmol/L   Potassium 4.3 3.5 - 5.1 mmol/L   Chloride 105 101 - 111 mmol/L   CO2 26 22 - 32 mmol/L   Glucose, Bld 126 (H) 65 - 99 mg/dL   BUN 19 6 - 20 mg/dL   Creatinine, Ser 0.91 0.61 - 1.24 mg/dL   Calcium 9.7 8.9 - 10.3 mg/dL   Total Protein 8.1 6.5 - 8.1 g/dL   Albumin 4.8 3.5 - 5.0 g/dL   AST 16 15 - 41 U/L   ALT 20 17 - 63 U/L   Alkaline Phosphatase 92 38 - 126 U/L    Total Bilirubin 0.9 0.3 - 1.2 mg/dL   GFR calc non Af Amer >60 >60 mL/min   GFR calc Af Amer >60 >60 mL/min    Comment: (NOTE) The eGFR has been calculated using the CKD EPI equation. This calculation has not been validated in all clinical situations. eGFR's persistently <60 mL/min signify possible Chronic Kidney Disease.    Anion gap 11 5 - 15    Comment: Performed at [facility], [place] [address] [place], [place] [address]  CBG monitoring, ED     Status: Abnormal   Collection Time: 07/21/17  8:36 PM  Result Value Ref Range   Glucose-Capillary 121 (H) 65 - 99 mg/dL  I-Stat Chem 8, ED     Status: Abnormal   Collection Time: 07/21/17  8:44 PM  Result Value Ref Range   Sodium 140 135 - 145 mmol/L   Potassium 4.2 3.5 - 5.1 mmol/L   Chloride 102 101 - 111 mmol/L   BUN 19 6 - 20 mg/dL   Creatinine, Ser 0.90 0.61 - 1.24 mg/dL   Glucose, Bld 122 (H) 65 - 99 mg/dL   Calcium, Ion 1.21 1.15 - 1.40 mmol/L   TCO2 28 22 - 32 mmol/L   Hemoglobin 17.0 13.0 - 17.0 g/dL   HCT 50.0 39.0 - 52.0 %  I-stat troponin, ED     Status: None   Collection Time: 07/21/17  8:45 PM  Result Value Ref Range   Troponin i, poc 0.01 0.00 - 0.08 ng/mL   Comment 3            Comment: Due to the release kinetics of cTnI, a negative result within the first hours of the onset of symptoms does not rule out myocardial infarction with certainty. If myocardial infarction is still suspected, repeat the test at appropriate intervals.   Rapid urine drug screen (hospital performed)     Status: None   Collection Time: 07/21/17 10:18 PM  Result Value Ref Range   Opiates NONE DETECTED NONE DETECTED   Cocaine NONE DETECTED NONE DETECTED   Benzodiazepines NONE DETECTED NONE DETECTED   Amphetamines NONE DETECTED NONE DETECTED   Tetrahydrocannabinol NONE DETECTED NONE DETECTED   Barbiturates NONE DETECTED NONE DETECTED    Comment: (NOTE) DRUG SCREEN FOR MEDICAL PURPOSES ONLY.  IF CONFIRMATION IS  NEEDED FOR ANY PURPOSE, NOTIFY LAB WITHIN 5 DAYS. LOWEST DETECTABLE LIMITS FOR URINE DRUG SCREEN Drug Class                     Cutoff (ng/mL) Amphetamine and metabolites    1000 Barbiturate and metabolites    200 Benzodiazepine                 370 Tricyclics and metabolites     300 Opiates and metabolites        300 Cocaine and metabolites        300 THC                            50 Performed at [facility], [place] [address] [place], [place] [address]   Urinalysis, Routine w reflex microscopic     Status: Abnormal   Collection Time: 07/21/17 10:18 PM  Result Value Ref Range   Color, Urine YELLOW YELLOW   APPearance CLEAR CLEAR   Specific Gravity, Urine 1.021 1.005 - 1.030   pH 6.0 5.0 - 8.0   Glucose, UA NEGATIVE NEGATIVE mg/dL   Hgb urine dipstick NEGATIVE NEGATIVE   Bilirubin Urine NEGATIVE NEGATIVE   Ketones, ur 5 (A) NEGATIVE mg/dL   Protein, ur NEGATIVE NEGATIVE mg/dL   Nitrite NEGATIVE NEGATIVE   Leukocytes, UA NEGATIVE NEGATIVE    Comment: Performed at [place] [address] [place], [place] [address]   Dg Chest 2 View  Result Date: 07/21/2017 CLINICAL DATA:  62 year old male with shortness of breath and wheezing. History of chemotherapy and radiation treatment for right-sided lung cancer in 2006. EXAM: CHEST  2 VIEW COMPARISON:  Chest radiograph dated 12/01/2016 and chest CT dated 06/23/2015 FINDINGS: There is emphysematous changes of the lungs. Stable postradiation treatment of the right hilum there is no focal consolidation, pleural effusion, or pneumothorax. The cardiac silhouette is within normal limits. No acute osseous pathology. IMPRESSION: No active cardiopulmonary disease. Electronically Signed   By: Anner Crete M.D.   On: 07/21/2017 22:13   Ct Head Wo Contrast  Result Date: 07/21/2017 CLINICAL DATA:  62 year old male with altered mental status following fall yesterday. Initial encounter. EXAM: CT HEAD WITHOUT  CONTRAST TECHNIQUE: Contiguous axial images were obtained from the base of the skull through the vertex without intravenous contrast. COMPARISON:  12/02/2014 CT and MR FINDINGS: Brain: No evidence of acute infarction, hemorrhage, hydrocephalus, extra-axial collection or mass lesion/mass effect. Atrophy, chronic small-vessel white matter ischemic changes and remote LEFT thalamic and LEFT caudate infarcts noted. Vascular: Atherosclerotic calcifications identified. Skull: No acute abnormality Sinuses/Orbits: No acute abnormality Other: None IMPRESSION: 1. No evidence of acute intracranial abnormality 2. Atrophy, chronic small-vessel white matter ischemic changes and remote LEFT thalamic and LEFT caudate infarcts. Electronically Signed   By: Margarette Canada M.D.   On: 07/21/2017 21:01    Pending Labs Unresulted Labs (From admission, onward)   None      Vitals/Pain Today's Vitals   07/21/17 2055 07/21/17 2100 07/21/17 2130 07/21/17 2234  BP:  (!) 190/98 (!) 188/109 (!) 176/113  Pulse:  87 86 90  Resp:  (!) 21 (!) 22 (!) 26  Temp:      TempSrc:      SpO2:    97%  PainSc: 0-No pain       Isolation Precautions No active isolations  Medications Medications  albuterol (PROVENTIL) (2.5 MG/3ML) 0.083% nebulizer solution 5 mg (  5 mg Nebulization Given 07/21/17 2141)  ipratropium (ATROVENT) nebulizer solution 0.5 mg (0.5 mg Nebulization Given 07/21/17 2141)  lisinopril (PRINIVIL,ZESTRIL) tablet 10 mg (10 mg Oral Given 07/21/17 2212)  hydrochlorothiazide (MICROZIDE) capsule 12.5 mg (12.5 mg Oral Given 07/21/17 2140)    Mobility walks

## 2017-07-21 NOTE — ED Notes (Signed)
[facility] was notified of pt's need of transportation to [facility].
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
## 2017-07-22 ENCOUNTER — Inpatient Hospital Stay (HOSPITAL_COMMUNITY): Payer: Medicare HMO

## 2017-07-22 DIAGNOSIS — Z720 Tobacco use: Secondary | ICD-10-CM | POA: Diagnosis not present

## 2017-07-22 DIAGNOSIS — Z8249 Family history of ischemic heart disease and other diseases of the circulatory system: Secondary | ICD-10-CM | POA: Diagnosis not present

## 2017-07-22 DIAGNOSIS — E871 Hypo-osmolality and hyponatremia: Secondary | ICD-10-CM | POA: Diagnosis not present

## 2017-07-22 DIAGNOSIS — F329 Major depressive disorder, single episode, unspecified: Secondary | ICD-10-CM | POA: Diagnosis present

## 2017-07-22 DIAGNOSIS — R1084 Generalized abdominal pain: Secondary | ICD-10-CM | POA: Diagnosis not present

## 2017-07-22 DIAGNOSIS — Z86010 Personal history of colonic polyps: Secondary | ICD-10-CM | POA: Diagnosis not present

## 2017-07-22 DIAGNOSIS — Z923 Personal history of irradiation: Secondary | ICD-10-CM | POA: Diagnosis not present

## 2017-07-22 DIAGNOSIS — E785 Hyperlipidemia, unspecified: Secondary | ICD-10-CM | POA: Diagnosis present

## 2017-07-22 DIAGNOSIS — R531 Weakness: Secondary | ICD-10-CM

## 2017-07-22 DIAGNOSIS — G9349 Other encephalopathy: Secondary | ICD-10-CM | POA: Diagnosis present

## 2017-07-22 DIAGNOSIS — I6381 Other cerebral infarction due to occlusion or stenosis of small artery: Secondary | ICD-10-CM | POA: Diagnosis present

## 2017-07-22 DIAGNOSIS — I69398 Other sequelae of cerebral infarction: Secondary | ICD-10-CM | POA: Diagnosis not present

## 2017-07-22 DIAGNOSIS — G8191 Hemiplegia, unspecified affecting right dominant side: Secondary | ICD-10-CM | POA: Diagnosis present

## 2017-07-22 DIAGNOSIS — I10 Essential (primary) hypertension: Secondary | ICD-10-CM | POA: Diagnosis present

## 2017-07-22 DIAGNOSIS — F17210 Nicotine dependence, cigarettes, uncomplicated: Secondary | ICD-10-CM | POA: Diagnosis present

## 2017-07-22 DIAGNOSIS — R4182 Altered mental status, unspecified: Secondary | ICD-10-CM | POA: Diagnosis present

## 2017-07-22 DIAGNOSIS — I6389 Other cerebral infarction: Secondary | ICD-10-CM | POA: Diagnosis not present

## 2017-07-22 DIAGNOSIS — R29701 NIHSS score 1: Secondary | ICD-10-CM | POA: Diagnosis present

## 2017-07-22 DIAGNOSIS — Z85118 Personal history of other malignant neoplasm of bronchus and lung: Secondary | ICD-10-CM | POA: Diagnosis not present

## 2017-07-22 DIAGNOSIS — K5900 Constipation, unspecified: Secondary | ICD-10-CM | POA: Diagnosis not present

## 2017-07-22 DIAGNOSIS — R269 Unspecified abnormalities of gait and mobility: Secondary | ICD-10-CM | POA: Diagnosis not present

## 2017-07-22 DIAGNOSIS — I69351 Hemiplegia and hemiparesis following cerebral infarction affecting right dominant side: Secondary | ICD-10-CM | POA: Diagnosis not present

## 2017-07-22 DIAGNOSIS — F17200 Nicotine dependence, unspecified, uncomplicated: Secondary | ICD-10-CM | POA: Diagnosis not present

## 2017-07-22 DIAGNOSIS — R270 Ataxia, unspecified: Secondary | ICD-10-CM | POA: Diagnosis present

## 2017-07-22 DIAGNOSIS — I2510 Atherosclerotic heart disease of native coronary artery without angina pectoris: Secondary | ICD-10-CM | POA: Diagnosis present

## 2017-07-22 DIAGNOSIS — H9191 Unspecified hearing loss, right ear: Secondary | ICD-10-CM | POA: Diagnosis present

## 2017-07-22 DIAGNOSIS — Z87442 Personal history of urinary calculi: Secondary | ICD-10-CM | POA: Diagnosis not present

## 2017-07-22 DIAGNOSIS — Z87898 Personal history of other specified conditions: Secondary | ICD-10-CM | POA: Diagnosis not present

## 2017-07-22 DIAGNOSIS — Z9221 Personal history of antineoplastic chemotherapy: Secondary | ICD-10-CM | POA: Diagnosis not present

## 2017-07-22 DIAGNOSIS — R451 Restlessness and agitation: Secondary | ICD-10-CM | POA: Diagnosis not present

## 2017-07-22 DIAGNOSIS — E7800 Pure hypercholesterolemia, unspecified: Secondary | ICD-10-CM | POA: Diagnosis present

## 2017-07-22 DIAGNOSIS — K219 Gastro-esophageal reflux disease without esophagitis: Secondary | ICD-10-CM | POA: Diagnosis present

## 2017-07-22 DIAGNOSIS — G629 Polyneuropathy, unspecified: Secondary | ICD-10-CM | POA: Diagnosis present

## 2017-07-22 DIAGNOSIS — N179 Acute kidney failure, unspecified: Secondary | ICD-10-CM | POA: Diagnosis not present

## 2017-07-22 DIAGNOSIS — Z8673 Personal history of transient ischemic attack (TIA), and cerebral infarction without residual deficits: Secondary | ICD-10-CM | POA: Diagnosis not present

## 2017-07-22 DIAGNOSIS — J441 Chronic obstructive pulmonary disease with (acute) exacerbation: Secondary | ICD-10-CM | POA: Diagnosis present

## 2017-07-22 DIAGNOSIS — B182 Chronic viral hepatitis C: Secondary | ICD-10-CM | POA: Diagnosis present

## 2017-07-22 DIAGNOSIS — B1920 Unspecified viral hepatitis C without hepatic coma: Secondary | ICD-10-CM | POA: Diagnosis not present

## 2017-07-22 DIAGNOSIS — I6330 Cerebral infarction due to thrombosis of unspecified cerebral artery: Secondary | ICD-10-CM | POA: Diagnosis not present

## 2017-07-22 DIAGNOSIS — Z789 Other specified health status: Secondary | ICD-10-CM | POA: Diagnosis not present

## 2017-07-22 DIAGNOSIS — I639 Cerebral infarction, unspecified: Secondary | ICD-10-CM | POA: Diagnosis not present

## 2017-07-22 DIAGNOSIS — Z801 Family history of malignant neoplasm of trachea, bronchus and lung: Secondary | ICD-10-CM | POA: Diagnosis not present

## 2017-07-22 DIAGNOSIS — R7303 Prediabetes: Secondary | ICD-10-CM | POA: Diagnosis present

## 2017-07-22 DIAGNOSIS — K5901 Slow transit constipation: Secondary | ICD-10-CM | POA: Diagnosis not present

## 2017-07-22 DIAGNOSIS — J449 Chronic obstructive pulmonary disease, unspecified: Secondary | ICD-10-CM | POA: Diagnosis not present

## 2017-07-22 LAB — LIPID PANEL
CHOLESTEROL: 158 mg/dL (ref 0–200)
HDL: 39 mg/dL — AB (ref >40)
LDL Cholesterol: 100 mg/dL — ABNORMAL HIGH (ref 0–99)
TRIGLYCERIDES: 95 mg/dL (ref <150)
Total CHOL/HDL Ratio: 4.1 RATIO
VLDL: 19 mg/dL (ref 0–40)

## 2017-07-22 LAB — CBG MONITORING, ED: GLUCOSE-CAPILLARY: 77 mg/dL (ref 65–99)

## 2017-07-22 LAB — AMMONIA: AMMONIA: 35 umol/L (ref 9–35)

## 2017-07-22 LAB — BASIC METABOLIC PANEL
Anion gap: 12 (ref 5–15)
BUN: 14 mg/dL (ref 6–20)
CHLORIDE: 100 mmol/L — AB (ref 101–111)
CO2: 23 mmol/L (ref 22–32)
Calcium: 9.1 mg/dL (ref 8.9–10.3)
Creatinine, Ser: 0.79 mg/dL (ref 0.61–1.24)
GFR calc Af Amer: >60 mL/min (ref >60)
GFR calc non Af Amer: >60 mL/min (ref >60)
Glucose, Bld: 107 mg/dL — ABNORMAL HIGH (ref 65–99)
POTASSIUM: 3.7 mmol/L (ref 3.5–5.1)
Sodium: 135 mmol/L (ref 135–145)

## 2017-07-22 LAB — TROPONIN I

## 2017-07-22 LAB — CBC
HEMATOCRIT: 45.9 % (ref 39.0–52.0)
HEMOGLOBIN: 15.5 g/dL (ref 13.0–17.0)
MCH: 31.1 pg (ref 26.0–34.0)
MCHC: 33.8 g/dL (ref 30.0–36.0)
MCV: 92.2 fL (ref 78.0–100.0)
Platelets: 209 10*3/uL (ref 150–400)
RBC: 4.98 MIL/uL (ref 4.22–5.81)
RDW: 15.1 % (ref 11.5–15.5)
WBC: 6.8 10*3/uL (ref 4.0–10.5)

## 2017-07-22 LAB — ETHANOL: Alcohol, Ethyl (B): <10 mg/dL (ref <10)

## 2017-07-22 LAB — TSH: TSH: 1.464 u[IU]/mL (ref 0.350–4.500)

## 2017-07-22 MED ORDER — IPRATROPIUM-ALBUTEROL 0.5-2.5 (3) MG/3ML IN SOLN
3.0000 mL | Freq: Four times a day (QID) | RESPIRATORY_TRACT | Status: DC
  Administered 2017-07-22: 3 mL via RESPIRATORY_TRACT
  Filled 2017-07-22: qty 3

## 2017-07-22 MED ORDER — LORATADINE 10 MG PO TABS
10.0000 mg | ORAL_TABLET | Freq: Every day | ORAL | Status: DC
  Administered 2017-07-22 – 2017-07-26 (×5): 10 mg via ORAL
  Filled 2017-07-22 (×5): qty 1

## 2017-07-22 MED ORDER — ACETAMINOPHEN 160 MG/5ML PO SOLN: 650.0000 mg | ORAL | Status: DC | PRN

## 2017-07-22 MED ORDER — PANTOPRAZOLE SODIUM 40 MG PO TBEC
40.0000 mg | DELAYED_RELEASE_TABLET | Freq: Every day | ORAL | Status: DC
  Administered 2017-07-22 – 2017-07-26 (×5): 40 mg via ORAL
  Filled 2017-07-22 (×5): qty 1

## 2017-07-22 MED ORDER — ATORVASTATIN CALCIUM 40 MG PO TABS
40.0000 mg | ORAL_TABLET | Freq: Every day | ORAL | Status: DC
  Administered 2017-07-22: 40 mg via ORAL
  Filled 2017-07-22: qty 1

## 2017-07-22 MED ORDER — ACETAMINOPHEN 650 MG RE SUPP: 650.0000 mg | RECTAL | Status: DC | PRN

## 2017-07-22 MED ORDER — VITAMIN B-1 100 MG PO TABS
100.0000 mg | ORAL_TABLET | Freq: Every day | ORAL | Status: DC
  Administered 2017-07-22 – 2017-07-26 (×5): 100 mg via ORAL
  Filled 2017-07-22 (×5): qty 1

## 2017-07-22 MED ORDER — ENOXAPARIN SODIUM 40 MG/0.4ML ~~LOC~~ SOLN
40.0000 mg | SUBCUTANEOUS | Status: DC
  Administered 2017-07-22: 40 mg via SUBCUTANEOUS
  Filled 2017-07-22: qty 0.4

## 2017-07-22 MED ORDER — ADULT MULTIVITAMIN W/MINERALS CH
1.0000 | ORAL_TABLET | Freq: Every day | ORAL | Status: DC
  Administered 2017-07-22 – 2017-07-26 (×5): 1 via ORAL
  Filled 2017-07-22 (×5): qty 1

## 2017-07-22 MED ORDER — LORAZEPAM 2 MG/ML IJ SOLN
1.0000 mg | Freq: Once | INTRAMUSCULAR | Status: AC
  Administered 2017-07-22: 1 mg via INTRAVENOUS
  Filled 2017-07-22: qty 1

## 2017-07-22 MED ORDER — ACETAMINOPHEN 325 MG PO TABS
650.0000 mg | ORAL_TABLET | ORAL | Status: DC | PRN
  Administered 2017-07-23: 650 mg via ORAL
  Filled 2017-07-22: qty 2

## 2017-07-22 MED ORDER — THIAMINE HCL 100 MG/ML IJ SOLN
100.0000 mg | Freq: Every day | INTRAMUSCULAR | Status: DC
  Filled 2017-07-22: qty 2

## 2017-07-22 MED ORDER — FOLIC ACID 1 MG PO TABS
1.0000 mg | ORAL_TABLET | Freq: Every day | ORAL | Status: DC
  Administered 2017-07-22 – 2017-07-26 (×5): 1 mg via ORAL
  Filled 2017-07-22 (×5): qty 1

## 2017-07-22 MED ORDER — NICOTINE 21 MG/24HR TD PT24
21.0000 mg | MEDICATED_PATCH | Freq: Every day | TRANSDERMAL | Status: DC
  Administered 2017-07-22 – 2017-07-26 (×5): 21 mg via TRANSDERMAL
  Filled 2017-07-22 (×6): qty 1

## 2017-07-22 MED ORDER — TRIAMCINOLONE ACETONIDE 0.5 % EX CREA
1.0000 "application " | TOPICAL_CREAM | Freq: Three times a day (TID) | CUTANEOUS | Status: DC
  Administered 2017-07-22 – 2017-07-26 (×9): 1 via TOPICAL
  Filled 2017-07-22 (×5): qty 15

## 2017-07-22 MED ORDER — FLUTICASONE-UMECLIDIN-VILANT 100-62.5-25 MCG/INH IN AEPB: 1.0000 | INHALATION_SPRAY | Freq: Every day | RESPIRATORY_TRACT | Status: DC

## 2017-07-22 MED ORDER — FLUTICASONE FUROATE-VILANTEROL 200-25 MCG/INH IN AEPB
1.0000 | INHALATION_SPRAY | Freq: Every day | RESPIRATORY_TRACT | Status: DC
  Administered 2017-07-24 – 2017-07-26 (×2): 1 via RESPIRATORY_TRACT
  Filled 2017-07-22: qty 28

## 2017-07-22 MED ORDER — OLANZAPINE 5 MG PO TBDP
2.5000 mg | ORAL_TABLET | Freq: Once | ORAL | Status: DC | PRN
  Filled 2017-07-22: qty 0.5

## 2017-07-22 MED ORDER — SODIUM CHLORIDE 0.9 % IV SOLN
INTRAVENOUS | Status: DC
  Administered 2017-07-22 – 2017-07-23 (×3): via INTRAVENOUS

## 2017-07-22 MED ORDER — IPRATROPIUM-ALBUTEROL 0.5-2.5 (3) MG/3ML IN SOLN: 3.0000 mL | Freq: Four times a day (QID) | RESPIRATORY_TRACT | Status: DC | PRN

## 2017-07-22 MED ORDER — UMECLIDINIUM BROMIDE 62.5 MCG/INH IN AEPB
1.0000 | INHALATION_SPRAY | Freq: Every day | RESPIRATORY_TRACT | Status: DC
  Administered 2017-07-24 – 2017-07-26 (×2): 1 via RESPIRATORY_TRACT
  Filled 2017-07-22: qty 7

## 2017-07-22 MED ORDER — STROKE: EARLY STAGES OF RECOVERY BOOK
Freq: Once | Status: AC
  Administered 2017-07-22: 09:00:00

## 2017-07-22 MED ORDER — OLANZAPINE 10 MG IM SOLR
5.0000 mg | Freq: Once | INTRAMUSCULAR | Status: AC
  Administered 2017-07-22: 5 mg via INTRAMUSCULAR
  Filled 2017-07-22: qty 10

## 2017-07-22 NOTE — Evaluation (Signed)
Occupational Therapy Evaluation Patient Details Name: [name] MRN: [id number] DOB: [birth year] Today's Date: 07/22/2017    History of Present Illness 62 y.o. male presenting with altered mental status. PMH is significant for Hx of stroke, Hx of small cell lung cancer, HTN, COPD, Anemia, HLD, Tobacco use disorder, and Depression.  Now with new ataxia and right UE weakness and progressing slurring.   Clinical Impression   PTA, pt reports independence with ADL however unsure of the reliability of his report. He is able to complete LB dressing tasks with verbal cues and mod assist for balance seated at EOB. He requires min assist for UB ADL and grooming tasks. Pt with multiple episodes of LOB while seated at EOB with no demonstration of protective reactions to attempt to correct himself. He presents with decreased activity tolerance for ADL and was unable to ambulate for ADL transfers this session but was able to take side steps at EOB with mod assist +2. Noted poor proprioception and coordination of R UE limiting his ability to engage in ADL tasks functionally. Pt additionally reporting dizziness in standing position. Recommend continued rehabilitation at SNF level post-acute D/C to maximize safety and independence with ADL and functional mobility prior to returning home. Will continue to follow while admitted.    Follow Up Recommendations  SNF;Supervision/Assistance - 24 hour    Equipment Recommendations  Other (comment)(TBD at next venue of care)    Recommendations for Other Services       Precautions / Restrictions Precautions Precautions: Fall Precaution Comments: pt high risk fall - no balance reactions Restrictions Weight Bearing Restrictions: No      Mobility Bed Mobility Overal bed mobility: Needs Assistance Bed Mobility: Supine to Sit;Sit to Supine     Supine to sit: Min assist Sit to supine: Min assist   General bed mobility comments: Pt able to utilize rail to  come to EOB with poor coordination and min assist. Assist for RLE back to bed.   Transfers Overall transfer level: Needs assistance Equipment used: Rolling walker (2 wheeled) Transfers: Sit to/from Stand Sit to Stand: Mod assist;+2 physical assistance         General transfer comment: Pt requiring assistance to position R hand on RW (automatic with L hand). Pt with limited standing tolerance and reported dizziness with standing tasks.     Balance Overall balance assessment: Needs assistance Sitting-balance support: No upper extremity supported;Feet supported Sitting balance-Leahy Scale: Poor Sitting balance - Comments: Required assistance to maintain upright but was able to hold for approximately 10-20 seconds initially once seated. Poor awareness of upright.    Standing balance support: Bilateral upper extremity supported;During functional activity Standing balance-Leahy Scale: Poor Standing balance comment: Requires +2 physical assistance.                            ADL either performed or assessed with clinical judgement   ADL Overall ADL's : Needs assistance/impaired Eating/Feeding: Sitting;Minimal assistance   Grooming: Sitting;Minimal assistance   Upper Body Bathing: Sitting;Minimal assistance   Lower Body Bathing: Sit to/from stand;Moderate assistance   Upper Body Dressing : Sitting;Minimal assistance   Lower Body Dressing: Sit to/from stand;Moderate assistance Lower Body Dressing Details (indicate cue type and reason): Assist to maintain balance.  Toilet Transfer: +2 for physical assistance;Moderate assistance Toilet Transfer Details (indicate cue type and reason): Only able to take side steps at EOB today.  Toileting- Clothing Manipulation and Hygiene: Maximal assistance;Sit to/from stand  General ADL Comments: Pt with limited tolerance for activity today reporting dizziness in standing position. He did require significant assistance to attend  to tasks today. Only able to stand for approximately 30 seconds at a time.      Vision   Vision Assessment?: Vision impaired- to be further tested in functional context Additional Comments: Needs further assessment. Question visual perceptual skills. Need to further assess.      Perception     Praxis Praxis Praxis tested?: Deficits Deficits: Organization    Pertinent Vitals/Pain Pain Assessment: No/denies pain     Hand Dominance Right   Extremity/Trunk Assessment Upper Extremity Assessment Upper Extremity Assessment: RUE deficits/detail RUE Deficits / Details: Poor proprioception noted with RUE movement during reach to target both with and without visual feedback.    Lower Extremity Assessment Lower Extremity Assessment: Defer to PT evaluation RLE Deficits / Details: pts right leg is not as strong as left - pt with full AROM but he doesnt automatically use this leg to scoot up in bed, cant hold it upright in hooklying. pt denies sensory changes in leg       Communication Communication Communication: (some difficulty understanding pt due to mumbling)   Cognition Arousal/Alertness: Awake/alert Behavior During Therapy: Flat affect;Restless Overall Cognitive Status: No family/caregiver present to determine baseline cognitive functioning Area of Impairment: Following commands;Safety/judgement;Awareness;Problem solving;Attention                   Current Attention Level: Sustained   Following Commands: Follows one step commands with increased time Safety/Judgement: Decreased awareness of safety;Decreased awareness of deficits Awareness: Emergent Problem Solving: Slow processing;Decreased initiation;Difficulty sequencing;Requires verbal cues;Requires tactile cues General Comments: Pt requiring cues to maintain attention to task. Able to report the month and year and that he is in the hospital.   General Comments       Exercises     Shoulder Instructions       Home Living Family/patient expects to be discharged to:: Private residence Living Arrangements: Alone                               Additional Comments: pt said he lives alone in a house and walked around safely with no AD.  Unsure accuracy of this information.      Prior Functioning/Environment          Comments: Pt reports independence with ADL but unsure of his true PLOF.         OT Problem List: Decreased strength;Decreased range of motion;Decreased activity tolerance;Impaired balance (sitting and/or standing);Decreased safety awareness;Decreased knowledge of use of DME or AE;Decreased knowledge of precautions;Pain;Impaired UE functional use;Impaired vision/perception;Decreased cognition;Decreased coordination      OT Treatment/Interventions: Self-care/ADL training;Therapeutic exercise;Energy conservation;DME and/or AE instruction;Therapeutic activities;Patient/family education;Balance training    OT Goals(Current goals can be found in the care plan section) Acute Rehab OT Goals Patient Stated Goal: to get better OT Goal Formulation: Patient unable to participate in goal setting Time For Goal Achievement: 08/05/17 Potential to Achieve Goals: Good ADL Goals Pt Will Perform Grooming: with min assist;standing Pt Will Transfer to Toilet: with min assist;ambulating;bedside commode Pt Will Perform Toileting - Clothing Manipulation and hygiene: with min assist;sit to/from stand Pt/caregiver will Perform Home Exercise Program: Right Upper extremity;With written HEP provided(increased proprioception) Additional ADL Goal #1: Pt will demonstrate emergent awareness during morning ADL routine. Additional ADL Goal #2: Pt will demonstrate selective attention to grooming tasks in a minimally  distracting environment.  OT Frequency: Min 2X/week   Barriers to D/C:            Co-evaluation PT/OT/SLP Co-Evaluation/Treatment: Yes Reason for Co-Treatment: Necessary to  address cognition/behavior during functional activity;For patient/therapist safety;To address functional/ADL transfers   OT goals addressed during session: ADL's and self-care;Strengthening/ROM      AM-PAC PT "6 Clicks" Daily Activity     Outcome Measure Help from another person eating meals?: A Little Help from another person taking care of personal grooming?: A Little Help from another person toileting, which includes using toliet, bedpan, or urinal?: A Lot Help from another person bathing (including washing, rinsing, drying)?: A Lot Help from another person to put on and taking off regular upper body clothing?: A Little Help from another person to put on and taking off regular lower body clothing?: A Lot 6 Click Score: 15   End of Session Equipment Utilized During Treatment: Gait belt;Rolling walker Nurse Communication: Mobility status  Activity Tolerance: Patient tolerated treatment well Patient left: in bed;with call bell/phone within reach;with bed alarm set  OT Visit Diagnosis: Other abnormalities of gait and mobility (R26.89);Other symptoms and signs involving cognitive function;Muscle weakness (generalized) (M62.81);Ataxia, unspecified (R27.0)                Time: 1420-1450 OT Time Calculation (min): 30 min Charges:  OT General Charges $OT Visit: 1 Visit OT Evaluation $OT Eval Moderate Complexity: 1 Mod G-Codes:     Norman Herrlich, MS OTR/L  Pager: Florence A Maxten Shuler 07/22/2017, 4:11 PM

## 2017-07-22 NOTE — ED Notes (Signed)
[facility] here to transport pt to [place].

## 2017-07-22 NOTE — Progress Notes (Signed)
FPTS Interim Progress Note  S: Notified by nursing that patient was agitated and trying to leave AMA. Patient is not oriented to place or time. He has been waiting on MRI to further assess etiology of altered mentation upon arrival to hospital. Upon arrival in the room I witnessed patient was agitated and states "I want to smoke a cigarette". He could not verbalize understanding of his medical condition nor could he verbalize where he was when I asked him and he could not give me a reasonable plan to leave safely. He has no car here and no way to get home. He could not safely demonstrate his ability to walk and could not teach back to me the concerns of why we wanted him to stay to show he understood the risk he would be taking upon leaving AMA.  O: BP (!) 161/98 (BP Location: Left Arm)   Pulse 93   Temp 98.3 F (36.8 C) (Oral)   Resp 20   Ht 5\' 7"  (1.702 m)   Wt 147 lb 4.3 oz (66.8 kg)   SpO2 98%   BMI 23.07 kg/m   Gen: Alert, Agitated, oriented to person only.  A/P:  Give 5mg  IV Zyprexa now to try and avoid benzos due to sedating effect it had on him earlier.  Requiring 4-5 security guards to administer Zyprexa due to agitation.   I believe it is in the best interest of this patient to stay due to inability to express understanding of his medical condition and risk he would be taking upon leaving the hospital. He could not express good judgment or reasoning at this time. Cognitive impairment is present as he could not express that he was at [facility].   Due to the reasons stated above we will IVC him for now until he can demonstrate capacity.  Discussed with upper level resident who agrees with IVC decision.  Cont to monitor mental status and will give benzo if Zyprexa does not help. Avoid Haldol due to prolonged QTC of 500.  Nuala Alpha, DO 07/22/2017, 11:14 PM PGY-1, [place] [facility] Service pager [phone]

## 2017-07-22 NOTE — Progress Notes (Addendum)
Family Medicine Teaching Service Daily Progress Note Intern Pager: [phone]  Patient name: [name] Medical record number: [id number] Date of birth: [birth year] Age: 62 y.o. Gender: male  Primary Care Provider: Zenia Resides, MD Consultants: None Code Status: Full  Pt Overview and Major Events to Date:  2/24: Admit for AMS; stroke vs. Intoxication vs. Withdrawal; CT head neg  Assessment and Plan: [name] is a 62 y.o. male presenting with altered mental status. PMH is significant for Hx of stroke, Hx of small cell lung cancer, HTN, COPD, Anemia, HLD, Tobacco use disorder, and Depression.  Altered Mental Status, ?improving: p/w new ataxia and RUE weakness, history of progressive slurring of speech at home. Considering stroke; CT with remote infarcts, no acute abnl, MRI pending. Other possible etiologies include intoxication vs. Possible withdrawal. ETOH negative, UDS negative, though he does have hx of drug use.  Ammonia andTSH normal. UA not suggestive of infection. - Neuro checks - PT/OT - MRI - hold sedating meds - consult Neuro if MRI abnormal - Stroke risk labs: TSH, HgbA1c, Lipid panel - CIWA  Diffuse Rash, stable: Eczema vs bed bugs. Patient was recently seen outpt for rash on upper and lower extremities. Was given Kenalog cream in office. Patient stated in ED "I have bedbugs".  - Contact precautions - Cont Kenalog cream - consider anti-histamine - follow up outpatient  COPD: Chronic, stable, not currently in exacerbation. Home meds include Albuterol and Trelegy - Restart home trelegy - duonebs q6 as needed  HLD: Chronic, stable  On Atorvastatin 40mg . Lipid panel below - Restart home atorvastatin  GERD: Chronic, stable. On omeprazole 40mg  daily at home. - restart home PPI  Depression: Chronic. On Buproprion 150mg  XL daily. - Hold until AMS clears for potential sedation  Neuropathy: Chronic, stable. On Gabapentin 300mg  TID at home. - Hold  until AMS clears for potential sedation  Tobacco Use Disorder: Chronic.  - Nicotine patch 21mg  daily  FEN/GI: NPO Prophylaxis: Lovenox  Disposition: Anticipate will need 1-2 further days of hospitalization to work up AMS  Subjective:  Patient wakes easily this AM but unable to state place or year this AM, responds "Yes" to most questions, does not seem to remember what brought him into the hospital. Difficult neuro exam this AM due to AMS.   Objective: Temp:  [98 F (36.7 C)-98.1 F (36.7 C)] 98.1 F (36.7 C) (02/24 0800) Pulse Rate:  [80-92] 80 (02/24 0800) Resp:  [18-26] 18 (02/24 0800) BP: (138-190)/(80-113) 140/80 (02/24 0800) SpO2:  [94 %-100 %] 99 % (02/24 0831) Weight:  [147 lb 4.3 oz (66.8 kg)-151 lb (68.5 kg)] 147 lb 4.3 oz (66.8 kg) (02/24 0558) Physical Exam: General: NAD, rests in bed Cardiovascular: RRR, no m/r/g Respiratory: CTA bil, no W/R/R Abdomen: soft, nt, nd Extremities: no LE edema NEURO: alert to self. Easily arousable. Difficult to assess for focal deficits with AMS. PERRL, EOMI. Does not smile on command or cooperate with other parts of neuro exam. Able to move tongue and speech is not slurred. ?3/5 RUE grip strength compared to 4/5 LUE. Moves lower extremities equally.  Laboratory: Recent Labs  Lab 07/21/17 2031 07/21/17 2044 07/22/17 0837  WBC 6.2  --  6.8  HGB 15.6 17.0 15.5  HCT 46.6 50.0 45.9  PLT 260  --  209   Recent Labs  Lab 07/21/17 2031 07/21/17 2044 07/22/17 0837  NA 142 140 135  K 4.3 4.2 3.7  CL 105 102 100*  CO2 26  --  23  BUN 19 19 14   CREATININE 0.91 0.90 0.79  CALCIUM 9.7  --  9.1  PROT 8.1  --   --   BILITOT 0.9  --   --   ALKPHOS 92  --   --   ALT 20  --   --   AST 16  --   --   GLUCOSE 126* 122* 107*   Significant labs this admission: Lipid Panel     Component Value Date/Time   CHOL 158 07/22/2017 0559   TRIG 95 07/22/2017 0559   HDL 39 (L) 07/22/2017 0559   CHOLHDL 4.1 07/22/2017 0559   VLDL 19  07/22/2017 0559   LDLCALC 100 (H) 07/22/2017 0559   TSH 1.464 Ethanol <10 UDS neg UA 5 ketones HbA1c pending   Imaging/Diagnostic Tests: Dg Chest 2 View 07/21/2017  IMPRESSION: No active cardiopulmonary disease.    Ct Head Wo Contrast 07/21/2017 CT and MR FINDINGS: Brain: No evidence of acute infarction, hemorrhage, hydrocephalus, extra-axial collection or mass lesion/mass effect. Atrophy, chronic small-vessel white matter ischemic changes and remote LEFT thalamic and LEFT caudate infarcts noted.  Vascular: Atherosclerotic calcifications identified.  Skull: No acute abnormality  Sinuses/Orbits: No acute abnormality  IMPRESSION:  1. No evidence of acute intracranial abnormality  2. Atrophy, chronic small-vessel white matter ischemic changes and remote LEFT thalamic and LEFT caudate infarcts.   Douglas Coombe, MD 07/22/2017, 10:46 AM PGY-2, [place] Intern pager: [phone], text pages welcome

## 2017-07-22 NOTE — Plan of Care (Signed)
  Clinical Measurements: Diagnostic test results will improve 07/22/2017 0618 - Progressing by Mikey College, RN   Clinical Measurements: Respiratory complications will improve 07/22/2017 0618 - Progressing by Mikey College, RN   Activity: Risk for activity intolerance will decrease 07/22/2017 0618 - Progressing by Mikey College, RN   Nutrition: Adequate nutrition will be maintained 07/22/2017 0618 - Progressing by Mikey College, RN

## 2017-07-22 NOTE — ED Provider Notes (Addendum)
12:45 AM  Pt is a 62 y.o. M as a transfer from [facility] ED. patient sent for MRI of the brain without contrast.  Has history of COPD and several strokes.  Came to the hospital with altered mental status.  Also having ataxic gait.  It is very difficult to ascertain what is new for the patient.  At this time he is oriented to person and place but not year.  He denies any pain.  He does have an ataxic gait and seems unsteady on his feet.  He cannot tell me if this is new.  He denies any numbness or focal weakness.  There is no drift.  No current aphasia, dysarthria.  Cranial nerves II through XII intact.  Labs, urine unremarkable.  2:15 AM  Pt now becoming increasingly agitated and unsteady on his feet.  I am worried he may fall.  He denies any alcohol or drug use but I am wondering there could be some component of withdrawal.  I will check an ammonia level.  He is a family medicine patient but given his significant altered mental status I recommend admission.  Will give Ativan for his agitation, altered mental status.  MRI pending.   2:26 AM Discussed patient's case with FM resident, Dr. Garlan Fillers.  I have recommended admission and patient (and family if present) agree with this plan. Admitting physician will place admission orders.   I reviewed all nursing notes, vitals, pertinent previous records, EKGs, lab and urine results, imaging (as available).     Ward, Delice Bison, DO 07/22/17 0226    Ward, Delice Bison, DO 07/22/17 [phone]

## 2017-07-22 NOTE — ED Notes (Addendum)
Discussed pt with MRI tech.  On assessment this RN feels pt will not tolerate MRI at this time d/t agitation. Given ativan 1 mg per order.  Pt attempting to climb out of bed, remove clothing, agitated, unable to verbalize what is bothering him. Reassured and given warm blankets.
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
## 2017-07-22 NOTE — ED Notes (Signed)
Pt resting comfortably.  Still moving quite a bit in the bed but not agitated at this time. Pleasant but continues to be altered. Following commands.

## 2017-07-22 NOTE — Evaluation (Signed)
Physical Therapy Evaluation Patient Details Name: [name] MRN: [id number] DOB: Sep 14, 1955 Today's Date: 07/22/2017   History of Present Illness  62 y.o. male presenting with altered mental status. PMH is significant for Hx of stroke, Hx of small cell lung cancer, HTN, COPD, Anemia, HLD, Tobacco use disorder, and Depression.  Now with new ataxia and right UE weakness and progressing slurring.  Clinical Impression  Pt with right leg weakness and decreased coordination (didn't use it at all to push up in bed and unable to hold it erect in hooklying).  Pt with poor sitting balance and tolerance.  Pt with poor balance reactions - didn't try to catch himself when lost balance forward when sitting on the bed.  Pt unable to ambulate - hard time holding RW with right hand and slumped standing and complained of being dizzy when up.  Pt needs +2 assist for all mobilty at this time.    Follow Up Recommendations SNF;Supervision/Assistance - 24 hour    Equipment Recommendations  None recommended by PT    Recommendations for Other Services       Precautions / Restrictions Precautions Precautions: Fall Precaution Comments: pt high risk fall - no balance reactions Restrictions Weight Bearing Restrictions: No      Mobility  Bed Mobility Overal bed mobility: Needs Assistance Bed Mobility: Supine to Sit;Sit to Supine     Supine to sit: Min assist Sit to supine: Min assist   General bed mobility comments: pt used rail and abel to wiggle around and get into sitting with min assist and verbal cues.  pt laid back down and needed some assist to get right leg back into the bed.  Transfers Overall transfer level: Needs assistance Equipment used: Rolling walker (2 wheeled) Transfers: Sit to/from Stand Sit to Stand: Mod assist;+2 physical assistance         General transfer comment: pt stood EOB with RW - needed help for him to find and grasp RW with right hand (left hand did it  automatically).  Pt stood with +2 assist but slumped standing posture.  Pt reported being dizzy in standing nad asked to sit back down.  Ambulation/Gait Ambulation/Gait assistance: Mod assist;+2 physical assistance   Assistive device: Rolling walker (2 wheeled)       General Gait Details: Pt stood EOB with RW - he stood in slumped posture and was instructed to march in place.  he did 2 steps and asked to sit down as he was dizzy.  pt rested and stood again - this time we had him walk sideways to get higher in the bed - pt needed mod assist x 2 to stay erect. pt sat back down and said he was less dizzy with second stand.  Stairs            Wheelchair Mobility    Modified Rankin (Stroke Patients Only)       Balance Overall balance assessment: Needs assistance     Sitting balance - Comments: pt started out sitting EOB with kyphotic posture but holding himself.  then he pitched forward and to the right - i had to catch him from falling off the bed - no balance reactions or no attempts for him to right himself.  Pt then sat EOB and was unsteady in all directions - needing min to mod assist to remain in sitting.         Standing balance comment: pt stood with RW in slumped posture with +2 assist - balance not  assessed but pt high risk of falls in standing                             Pertinent Vitals/Pain Pain Assessment: No/denies pain    Home Living Family/patient expects to be discharged to:: Private residence Living Arrangements: Alone               Additional Comments: pt said he lives alone in a house and walked around safely with no AD.  Unsure accuracy of this information.    Prior Function                 Hand Dominance        Extremity/Trunk Assessment        Lower Extremity Assessment Lower Extremity Assessment: RLE deficits/detail RLE Deficits / Details: pts right leg is not as strong as left - pt with full AROM but he doesnt  automatically use this leg to scoot up in bed, cant hold it upright in hooklying. pt denies sensory changes in leg       Communication   Communication: Expressive difficulties(pt difficult to understand but can understand him with occasional repetition)  Cognition Arousal/Alertness: Awake/alert Behavior During Therapy: Flat affect;Restless Overall Cognitive Status: No family/caregiver present to determine baseline cognitive functioning                                        General Comments      Exercises     Assessment/Plan    PT Assessment Patient needs continued PT services  PT Problem List Decreased strength;Decreased activity tolerance;Decreased balance;Decreased mobility;Decreased coordination;Decreased knowledge of use of DME;Decreased safety awareness;Decreased knowledge of precautions;Cardiopulmonary status limiting activity       PT Treatment Interventions DME instruction;Therapeutic activities;Gait training;Therapeutic exercise;Balance training;Functional mobility training;Neuromuscular re-education;Patient/family education    PT Goals (Current goals can be found in the Care Plan section)  Acute Rehab PT Goals Patient Stated Goal: to get better PT Goal Formulation: With patient Time For Goal Achievement: 08/05/17 Potential to Achieve Goals: Fair    Frequency Min 3X/week   Barriers to discharge   unsure of DC support pt would have    Co-evaluation               AM-PAC PT "6 Clicks" Daily Activity  Outcome Measure Difficulty turning over in bed (including adjusting bedclothes, sheets and blankets)?: Unable Difficulty moving from lying on back to sitting on the side of the bed? : Unable Difficulty sitting down on and standing up from a chair with arms (e.g., wheelchair, bedside commode, etc,.)?: Unable Help needed moving to and from a bed to chair (including a wheelchair)?: Total Help needed walking in hospital room?: Total Help needed  climbing 3-5 steps with a railing? : Total 6 Click Score: 6    End of Session Equipment Utilized During Treatment: Gait belt Activity Tolerance: Patient limited by fatigue Patient left: in bed;with call bell/phone within reach;with bed alarm set Nurse Communication: Mobility status PT Visit Diagnosis: Unsteadiness on feet (R26.81);Hemiplegia and hemiparesis;Muscle weakness (generalized) (M62.81);Ataxic gait (R26.0) Hemiplegia - Right/Left: Right Hemiplegia - dominant/non-dominant: Dominant    Time: 1400-1450 PT Time Calculation (min) (ACUTE ONLY): 50 min   Charges:   PT Evaluation $PT Eval Low Complexity: 1 Low PT Treatments $Therapeutic Activity: 23-37 mins   PT G Codes:  07/22/2017   Rande Lawman, PT   Loyal Buba 07/22/2017, 2:57 PM

## 2017-07-23 ENCOUNTER — Inpatient Hospital Stay (HOSPITAL_COMMUNITY): Payer: Medicare HMO

## 2017-07-23 DIAGNOSIS — E785 Hyperlipidemia, unspecified: Secondary | ICD-10-CM

## 2017-07-23 DIAGNOSIS — I6330 Cerebral infarction due to thrombosis of unspecified cerebral artery: Secondary | ICD-10-CM

## 2017-07-23 DIAGNOSIS — J441 Chronic obstructive pulmonary disease with (acute) exacerbation: Secondary | ICD-10-CM

## 2017-07-23 DIAGNOSIS — R451 Restlessness and agitation: Secondary | ICD-10-CM

## 2017-07-23 DIAGNOSIS — F1011 Alcohol abuse, in remission: Secondary | ICD-10-CM

## 2017-07-23 DIAGNOSIS — I10 Essential (primary) hypertension: Secondary | ICD-10-CM

## 2017-07-23 DIAGNOSIS — I639 Cerebral infarction, unspecified: Secondary | ICD-10-CM

## 2017-07-23 LAB — BASIC METABOLIC PANEL
Anion gap: 9 (ref 5–15)
BUN: 18 mg/dL (ref 6–20)
CALCIUM: 9.1 mg/dL (ref 8.9–10.3)
CO2: 26 mmol/L (ref 22–32)
CREATININE: 1.01 mg/dL (ref 0.61–1.24)
Chloride: 105 mmol/L (ref 101–111)
GFR calc Af Amer: >60 mL/min (ref >60)
GLUCOSE: 97 mg/dL (ref 65–99)
POTASSIUM: 3.7 mmol/L (ref 3.5–5.1)
Sodium: 140 mmol/L (ref 135–145)

## 2017-07-23 LAB — ECHOCARDIOGRAM COMPLETE
EERAT: 7.41
FS: 28 % (ref 28–44)
HEIGHTINCHES: 67 in
IVS/LV PW RATIO, ED: 1
LA diam index: 1.63 cm/m2
LA vol A4C: 24.4 ml
LA vol index: 15.5 mL/m2
LASIZE: 29 mm
LAVOL: 27.6 mL
LDCA: 3.8 cm2
LEFT ATRIUM END SYS DIAM: 29 mm
LV E/e'average: 7.41
LVEEMED: 7.41
LVELAT: 7.72 cm/s
LVOT SV: 62 mL
LVOT VTI: 16.2 cm
LVOT diameter: 22 mm
LVOT peak grad rest: 3 mmHg
LVOTPV: 80.5 cm/s
MV pk A vel: 83.4 m/s
MVPKEVEL: 57.2 m/s
PW: 11 mm — AB (ref 0.6–1.1)
RV LATERAL S' VELOCITY: 10.9 cm/s
TDI e' lateral: 7.72
TDI e' medial: 5.22
WEIGHTICAEL: 2356.28 [oz_av]

## 2017-07-23 LAB — CBC
HCT: 44 % (ref 39.0–52.0)
Hemoglobin: 15 g/dL (ref 13.0–17.0)
MCH: 31.4 pg (ref 26.0–34.0)
MCHC: 34.1 g/dL (ref 30.0–36.0)
MCV: 92.2 fL (ref 78.0–100.0)
PLATELETS: 228 10*3/uL (ref 150–400)
RBC: 4.77 MIL/uL (ref 4.22–5.81)
RDW: 14.5 % (ref 11.5–15.5)
WBC: 6.6 10*3/uL (ref 4.0–10.5)

## 2017-07-23 LAB — HEMOGLOBIN A1C
Hgb A1c MFr Bld: 5.7 % — ABNORMAL HIGH (ref 4.8–5.6)
MEAN PLASMA GLUCOSE: 117 mg/dL

## 2017-07-23 LAB — HIV ANTIBODY (ROUTINE TESTING W REFLEX): HIV Screen 4th Generation wRfx: NONREACTIVE

## 2017-07-23 LAB — RPR: RPR: NONREACTIVE

## 2017-07-23 MED ORDER — DIPHENHYDRAMINE HCL 25 MG PO CAPS
25.0000 mg | ORAL_CAPSULE | Freq: Once | ORAL | Status: AC
  Administered 2017-07-23: 25 mg via ORAL
  Filled 2017-07-23: qty 1

## 2017-07-23 MED ORDER — DIPHENHYDRAMINE HCL 12.5 MG/5ML PO ELIX: 12.5000 mg | ORAL_SOLUTION | Freq: Once | ORAL | Status: DC

## 2017-07-23 MED ORDER — HYDRALAZINE HCL 20 MG/ML IJ SOLN: 10.0000 mg | INTRAMUSCULAR | Status: DC | PRN

## 2017-07-23 MED ORDER — ASPIRIN EC 81 MG PO TBEC
81.0000 mg | DELAYED_RELEASE_TABLET | Freq: Every day | ORAL | Status: DC
  Administered 2017-07-23 – 2017-07-26 (×4): 81 mg via ORAL
  Filled 2017-07-23 (×4): qty 1

## 2017-07-23 MED ORDER — CLOPIDOGREL BISULFATE 75 MG PO TABS
75.0000 mg | ORAL_TABLET | Freq: Every day | ORAL | Status: DC
  Administered 2017-07-23 – 2017-07-26 (×4): 75 mg via ORAL
  Filled 2017-07-23 (×4): qty 1

## 2017-07-23 MED ORDER — IOPAMIDOL (ISOVUE-370) INJECTION 76%
INTRAVENOUS | Status: AC
  Administered 2017-07-23: 50 mL
  Filled 2017-07-23: qty 50

## 2017-07-23 MED ORDER — STROKE: EARLY STAGES OF RECOVERY BOOK: Freq: Once | Status: DC

## 2017-07-23 MED ORDER — ATORVASTATIN CALCIUM 80 MG PO TABS
80.0000 mg | ORAL_TABLET | Freq: Every day | ORAL | Status: DC
  Administered 2017-07-23 – 2017-07-26 (×4): 80 mg via ORAL
  Filled 2017-07-23 (×4): qty 1

## 2017-07-23 NOTE — Progress Notes (Signed)
Family Medicine Teaching Service Daily Progress Note Intern Pager: [phone]  Patient name: [name] Medical record number: [id number] Date of birth: [birth year] Age: 62 y.o. Gender: male  Primary Care Provider: Zenia Resides, MD Consultants: None Code Status: Full  Pt Overview and Major Events to Date:  2/24: Admit for AMS; stroke vs. Intoxication vs. Withdrawal; CT head neg  Assessment and Plan: [name] is a 62 y.o. male presenting with altered mental status. PMH is significant for Hx of stroke, Hx of small cell lung cancer, HTN, COPD, Anemia, HLD, Tobacco use disorder, and Depression.  Altered Mental Status 2/2 to acute infarct: Chronic. Patient had to be IVC'd on the evening of 2/24. Please see interm progress note for details. Patient is now agitated again this morning wanting to leave. I convinced patient to stay for now but unclear how long he will be compliant. p/w new ataxia and RUE weakness, history of progressive slurring of speech at home. Stroke most likely given MRI results; MRI shows small vessel infarcts, though multifocal nature does raise the possibility of emboli.  There is a question of micro hemorrhage associated with pontine infarct but follow up CTA showed no evidence of hemorrhage. - Reassess for capacity and d/c IVC if capacity is demonstrated; must demonstrate insight, ability to weigh risk and benefits, and give a coherent principled reason for wanting to leave. - Social work consult to find family to inform them of his condition - Neurology following; appreciate recs; Started ASA 81mg  and Plavix 75mg  and increased Atorvastatin to 80mg  - CT Angiogram to evaluate for hemorrhage - negative for hemorrhage - Neuro checks - PT/OT - Cont to hold sedating meds - Stroke risk labs: TSH nl, HgbA1c pending, Lipid panel (see labs section) - CIWA  Diffuse Rash, stable: Patient complains of itching this am. Gave one time dose of Benadryl. Eczema vs bed bugs.  Patient was recently seen outpt for rash on upper and lower extremities. Was given Kenalog cream in office. Patient stated in ED "I have bedbugs".  - Contact precautions - Cont Kenalog cream - Claritin 10mg  daily - follow up outpatient  COPD: Chronic, stable, not currently in exacerbation. Home meds include Albuterol and Trelegy - Restart home trelegy - duonebs q6 as needed  HLD: Chronic, stable  On Atorvastatin 40mg . Lipid panel showed LDL of 100. Consider increasing to high intensity statin. - Start atorvastatin 80mg  daily  GERD: Chronic, stable. On omeprazole 40mg  daily at home. - Cont PPI, Protonix 40mg  daily  Depression: Chronic. On Buproprion 150mg  XL daily. - Hold until AMS clears for potential sedation  Neuropathy: Chronic, stable. On Gabapentin 300mg  TID at home. - Hold until AMS clears for potential sedation  Tobacco Use Disorder: Chronic.  - Nicotine patch 21mg  daily  FEN/GI: NPO Prophylaxis: Lovenox  Disposition: Anticipate will need 1-2 further days of hospitalization to work up AMS  Subjective:  Patient is resting comfortably in bed. He is easily awakened and respond to verbal and physical stimuli. He states he is not in any pain and is "just tired". He denies any difficulty breathing, SOB, or chest pain.   Called back to the room and patient was agitated and wanted to leave. Several of his friends from [facility] were in the room and helped convinced patient to stay and continue to receive treatment.  Objective: Temp:  [97.7 F (36.5 C)-98.3 F (36.8 C)] 98.1 F (36.7 C) (02/25 0402) Pulse Rate:  [80-105] 80 (02/25 0402) Resp:  [18-20] 20 (02/25 0402) BP: (  99-184)/(47-98) 184/88 (02/25 0402) SpO2:  [94 %-100 %] 100 % (02/25 0402) Physical Exam: General: NAD, resting bed Cardiovascular: RRR, no murmurs, normal S1, S2 Respiratory: CTAB, no wheezing, no crackles Abdomen: soft, nt, nd Extremities: no LE edema NEURO: unable to perform due to patient  sedation  Laboratory: Recent Labs  Lab 07/21/17 2031 07/21/17 2044 07/22/17 0837 07/23/17 0726  WBC 6.2  --  6.8 6.6  HGB 15.6 17.0 15.5 15.0  HCT 46.6 50.0 45.9 44.0  PLT 260  --  209 228   Recent Labs  Lab 07/21/17 2031 07/21/17 2044 07/22/17 0837 07/23/17 0726  NA 142 140 135 140  K 4.3 4.2 3.7 3.7  CL 105 102 100* 105  CO2 26  --  23 26  BUN 19 19 14 18   CREATININE 0.91 0.90 0.79 1.01  CALCIUM 9.7  --  9.1 9.1  PROT 8.1  --   --   --   BILITOT 0.9  --   --   --   ALKPHOS 92  --   --   --   ALT 20  --   --   --   AST 16  --   --   --   GLUCOSE 126* 122* 107* 97   Significant labs this admission: Lipid Panel     Component Value Date/Time   CHOL 158 07/22/2017 0559   TRIG 95 07/22/2017 0559   HDL 39 (L) 07/22/2017 0559   CHOLHDL 4.1 07/22/2017 0559   VLDL 19 07/22/2017 0559   LDLCALC 100 (H) 07/22/2017 0559   TSH 1.464 Ethanol <10 UDS neg UA 5 ketones HbA1c pending   Imaging/Diagnostic Tests: Dg Chest 2 View 07/21/2017  IMPRESSION: No active cardiopulmonary disease.    Ct Head Wo Contrast 07/21/2017 CT and MR FINDINGS: Brain: No evidence of acute infarction, hemorrhage, hydrocephalus, extra-axial collection or mass lesion/mass effect. Atrophy, chronic small-vessel white matter ischemic changes and remote LEFT thalamic and LEFT caudate infarcts noted.  Vascular: Atherosclerotic calcifications identified.  Skull: No acute abnormality  Sinuses/Orbits: No acute abnormality  IMPRESSION:  1. No evidence of acute intracranial abnormality  2. Atrophy, chronic small-vessel white matter ischemic changes and remote LEFT thalamic and LEFT caudate infarcts.   MRI Brain IMPRESSION: 1. Limited motion degraded examination, patient could not complete examination. 2. Acute LEFT caudate to temporal stem nonhemorrhagic infarct. Acute LEFT pontine lacunar infarct with possible microhemorrhage. 3. Old RIGHT cerebellar, LEFT basal ganglia and LEFT  thalamus infarcts. 4. Moderate parenchymal brain volume loss, advanced for age. 5. Moderate chronic small vessel ischemic disease.  CTA IMPRESSION: CT HEAD:  1. Evolving acute nonhemorrhagic LEFT basal ganglia to temporal stem infarct. 2. Old RIGHT cerebellar, pontine, LEFT basal ganglia and LEFT thalamus infarcts. 3. Moderate parenchymal brain volume loss, advanced for age. 4. Moderate chronic small vessel ischemic disease.  CTA NECK:  1. No hemodynamically significant stenosis ICA. 2. Moderate stenosis proximal LEFT subclavian artery.  CTA HEAD:  1. No emergent large vessel occlusion. 2. Atherosclerosis resulting in moderate to severe stenoses bilateral posterior cerebral arteries. 3. Moderate stenosis RIGHT M2 segment.  Nuala Alpha, DO 07/23/2017, 6:39 AM PGY-1, [place] Intern pager: [phone], text pages welcome

## 2017-07-23 NOTE — Progress Notes (Signed)
Received patient back to unit from MRI via bed accompanied by SWAB RN pt alert oriented to person and place and follow command on contact for Bed bug, calm at present. Is an IVC patient.. On a 1;1 Air cabin crew for safety and elopement. Will continue to monitor.
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
## 2017-07-23 NOTE — Care Management Note (Signed)
Case Management Note  Patient Details  Name: [name] MRN: [id number] Date of Birth: [birth year]  Subjective/Objective:      Pt admitted with CVA. He is from home alone. Currently pt is very confused.              Action/Plan: PT/OT recommending SNF. CM following for d/c disposition.  Expected Discharge Date:                  Expected Discharge Plan:  Skilled Nursing Facility  In-House Referral:  Clinical Social Work  Discharge planning Services     Post Acute Care Choice:    Choice offered to:     DME Arranged:    DME Agency:     HH Arranged:    [facility] Agency:     Status of Service:  In process, will continue to follow  If discussed at Long Length of Stay Meetings, dates discussed:    Additional Comments:  Pollie Friar, RN 07/23/2017, 2:40 PM

## 2017-07-23 NOTE — Progress Notes (Addendum)
NEUROHOSPITALISTS STROKE TEAM - DAILY PROGRESS NOTE   ADMISSION HISTORY:  [name] is a 62 y.o. male admitted with altered mental status who was found to have ischemic strokes.  He states that he has been having problems with right-sided weakness for at least a few days.   Since admission, there has been some concern for alcohol withdrawal and he has been somewhat agitated.  LKW: Unclear but at least several days ago tpa given?: no, outside of window  SUBJECTIVE (INTERVAL HISTORY) No family is at the bedside. Patient is found laying in bed in NAD. Overall he feels hiscondition is unchanged. Voices no new complaints. No new events reported overnight.   Patient admits that he was not taking any of his home medications and continued to smoke despite being counseled on last admission to quit for stroke prevention   OBJECTIVE Lab Results: CBC:  Recent Labs  Lab 07/21/17 2031 07/21/17 2044 07/22/17 0837 07/23/17 0726  WBC 6.2  --  6.8 6.6  HGB 15.6 17.0 15.5 15.0  HCT 46.6 50.0 45.9 44.0  MCV 93.0  --  92.2 92.2  PLT 260  --  209 228   BMP: Recent Labs  Lab 07/21/17 2031 07/21/17 2044 07/22/17 0837 07/23/17 0726  NA 142 140 135 140  K 4.3 4.2 3.7 3.7  CL 105 102 100* 105  CO2 26  --  23 26  GLUCOSE 126* 122* 107* 97  BUN 19 19 14 18   CREATININE 0.91 0.90 0.79 1.01  CALCIUM 9.7  --  9.1 9.1   Liver Function Tests:  Recent Labs  Lab 07/21/17 2031  AST 16  ALT 20  ALKPHOS 92  BILITOT 0.9  PROT 8.1  ALBUMIN 4.8   Recent Labs  Lab 07/22/17 0217  AMMONIA 35   Thyroid Function Studies:  Recent Labs    07/22/17 0559  TSH 1.464   Cardiac Enzymes:  Recent Labs  Lab 07/22/17 0559  TROPONINI <0.03   Coagulation Studies:  Recent Labs    07/21/17 2031  APTT 34  INR 1.00   Urinalysis:  Recent Labs  Lab 07/21/17 2218  COLORURINE YELLOW  APPEARANCEUR CLEAR  LABSPEC 1.021  PHURINE 6.0    GLUCOSEU NEGATIVE  HGBUR NEGATIVE  BILIRUBINUR NEGATIVE  KETONESUR 5*  PROTEINUR NEGATIVE  NITRITE NEGATIVE  LEUKOCYTESUR NEGATIVE   Urine Drug Screen:     Component Value Date/Time   LABOPIA NONE DETECTED 07/21/2017 2218   COCAINSCRNUR NONE DETECTED 07/21/2017 2218   LABBENZ NONE DETECTED 07/21/2017 2218   AMPHETMU NONE DETECTED 07/21/2017 2218   THCU NONE DETECTED 07/21/2017 2218   LABBARB NONE DETECTED 07/21/2017 2218    Alcohol Level:  Recent Labs  Lab 07/22/17 0317  ETH <10    PHYSICAL EXAM Temp:  [97.7 F (36.5 C)-98.4 F (36.9 C)] 98.3 F (36.8 C) (02/25 1535) Pulse Rate:  [80-102] 102 (02/25 1535) Resp:  [16-20] 18 (02/25 1535) BP: (99-184)/(47-98) 111/71 (02/25 1535) SpO2:  [98 %-100 %] 100 % (02/25 1535) General - Well nourished, well developed, in no apparent distress HEENT-  Normocephalic,  Cardiovascular - Regular rate and rhythm  Respiratory - Lungs clear bilaterally. No wheezing. Abdomen - soft and non-tender, BS normal Extremities- no edema or cyanosis Mental Status: Patient is awake, alert, oriented to person, place, month, year,  Patient is able to give a clear and coherent history. No signs of aphasia or neglect Cranial Nerves: II: Visual Fields are full. Pupils are equal, round, and reactive to  light.   III,IV, VI: EOMI without ptosis or diploplia.  V: Facial sensation is symmetric to temperature VII: Facial movement with mildly reduced nasolabial fold on the right VIII: hearing is intact to voice X: Uvula elevates symmetrically XI: Shoulder shrug is symmetric. XII: tongue is midline without atrophy or fasciculations.  Motor: Tone is normal. Bulk is normal.  He has a right hemiparesis, 4/5.  Sensory: Sensation is symmetric to light touch and temperature in the arms and legs. Cerebellar: He does appear mildly ataxic on the right, though possibly consistent with weakness.  IMAGING: I have personally reviewed the radiological images  below and agree with the radiology interpretations.  Ct Head Wo Contrast Result Date: 07/21/2017  IMPRESSION: 1. No evidence of acute intracranial abnormality 2. Atrophy, chronic small-vessel white matter ischemic changes and remote LEFT thalamic and LEFT caudate infarcts. Electronically Signed   By: Margarette Canada M.D.   On: 07/21/2017 21:01   Ct Angio Head/ Neck W Or Wo Contrast Result Date: 07/23/2017  IMPRESSION: CT HEAD: 1. Evolving acute nonhemorrhagic LEFT basal ganglia to temporal stem infarct. 2. Old RIGHT cerebellar, pontine, LEFT basal ganglia and LEFT thalamus infarcts. 3. Moderate parenchymal brain volume loss, advanced for age. 4. Moderate chronic small vessel ischemic disease. CTA NECK: 1. No hemodynamically significant stenosis ICA. 2. Moderate stenosis proximal LEFT subclavian artery. CTA HEAD: 1. No emergent large vessel occlusion. 2. Atherosclerosis resulting in moderate to severe stenoses bilateral posterior cerebral arteries. 3. Moderate stenosis RIGHT M2 segment. Aortic Atherosclerosis (ICD10-I70.0) and Emphysema (ICD10-J43.9). Electronically Signed   By: Elon Alas M.D.   On: 07/23/2017 04:24   Mr Brain Wo Contrast Result Date: 07/23/2017 IMPRESSION: 1. Limited motion degraded examination, patient could not complete examination. 2. Acute LEFT caudate to temporal stem nonhemorrhagic infarct. Acute LEFT pontine lacunar infarct with possible microhemorrhage. 3. Old RIGHT cerebellar, LEFT basal ganglia and LEFT thalamus infarcts. 4. Moderate parenchymal brain volume loss, advanced for age. 5. Moderate chronic small vessel ischemic disease. Electronically Signed   By: Elon Alas M.D.   On: 07/23/2017 00:56   Echocardiogram:                                               Study Conclusions - Left ventricle: The cavity size was normal. Systolic function was   normal. The estimated ejection fraction was in the range of 55%   to 60%. - Mitral valve: Calcified annulus. Mildly  thickened leaflets .     IMPRESSION: [name] is a 62 y.o. male with PMH of HTN, HLD,Hx of CVA's x 4 and medication noncompliance who presents with acute onset right sided weakness. MRI reveals:   Acute LEFT caudate to temporal stem nonhemorrhagic infarct.  Acute LEFT pontine lacunar infarct with possible microhemorrhage  Suspected Etiology: Likely small vessel disease versus cardioembolic Resultant Symptoms: right sided weakness Stroke Risk Factors: diabetes mellitus, hyperlipidemia and hypertension Other Stroke Risk Factors: Advanced age, Cigarette smoker, ETOH use Obesity, Body mass index is 23.07 kg/m. , Hx stroke x 4, PAD, CAD, Hx of Lung/Renal Cancer, Hx Hep C  Outstanding Stroke Work-up Studies:    Work up completed  PLAN  07/23/2017: Continue Aspirin/ Plavix/ Statin 30 Day event monitoring at discharge to R/O AFIB Frequent neuro checks Telemetry monitoring PT/OT/SLP Consult PM & Rehab Consult Case Management /MSW Ongoing aggressive stroke risk factor management Patient  counseled to be compliant with her antithrombotic medications Patient counseled on Lifestyle modifications including, Diet, Exercise, and Stress Follow up with [facility] in 6 weeks  HX OF STROKES: Old RIGHT cerebellar, LEFT basal ganglia and LEFT thalamus infarcts.  INTRACRANIAL Atherosclerosis &Stenosis: On DAPT, continue for 3 weeks and then Plavix alone  DYSPHAGIA: Passed SLP swallow evaluation Aspiration Precautions in progress  AFIB, CHRONIC: 30 Day event monitoring at discharge  HYPERTENSION: Stable Permissive hypertension (OK if <220/120) for 24-48 hours post stroke and then gradually normalized within 5-7 days. Long term BP goal normotensive. May slowly start B/P medications after 48 hours Home Meds: NONE  HYPERLIPIDEMIA:    Component Value Date/Time   CHOL 158 07/22/2017 0559   TRIG 95 07/22/2017 0559   HDL 39 (L) 07/22/2017 0559   CHOLHDL 4.1  07/22/2017 0559   VLDL 19 07/22/2017 0559   LDLCALC 100 (H) 07/22/2017 0559  Home Meds:  NONE LDL  goal < 70 Started on  Lipitor to 80 mg daily Continue statin at discharge  PRE- DIABETES: Lab Results  Component Value Date   HGBA1C 5.7 (H) 07/22/2017  HgbA1c goal < 7.0 Continue CBG monitoring and SSI to maintain glucose 140-180 mg/dl DM education   TOBACCO ABUSE Current smoker Smoking cessation counseling provided Nicotine patch provided  Other Active Problems: Principal Problem:   Cerebral thrombosis with cerebral infarction Active Problems:   Hyperlipidemia   COPD exacerbation (HCC)   Altered mental status, unspecified   Ataxia   Weakness   Agitation    Hospital day # 1 VTE prophylaxis: SCD's Diet : Fall precautions Fall precautions Diet Heart Room service appropriate? Yes; Fluid consistency: Thin   FAMILY UPDATES: No family at bedside  TEAM UPDATES: Lind Covert, MD STATUS:     Prior Home Stroke Medications:  No antithrombotic  Discharge Stroke Meds:  Please discharge patient on aspirin 81 mg daily and clopidogrel 75 mg daily   Disposition: 07-Left Against Medical Advice/Left Without Being Seen/Elopement Therapy Recs:               PENDING Follow Up:  Follow-up Information    Dennie Bible, NP. Schedule an appointment as soon as possible for a visit in 6 week(s).   Specialty:  Family Medicine Contact information: [address] [place] [address] [phone]          Zenia Resides, MD -PCP Follow up in 1-2 weeks      Assessment & plan discussed with with attending physician and they are in agreement.    Mary Sella, ANP-C Stroke Neurology Team 07/23/2017 4:03 PM   07/23/2017 ATTENDING ASSESSMENT:   I reviewed above note and agree with the assessment and plan. I have made any additions or clarifications directly to the above note. Pt was seen and examined.   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  He had a stroke in 2012 with right internal capsule and left MCA infarcts.  In 11/2014 he had again left thalamic stroke.  MRA head negative.  LDL 92 and A1c 5.7.  He was discharged on aspirin and Lipitor.  On this admission, he had right facial droop, right arm and leg mild hemiparesis, 4+/5.  He still smoking, but has quit alcohol.  MRI showed acute left pontine and left AchA infarct including  posterior left CR, left BG/PLIC and optic radiation.  Also remote right cerebellum and left BG/thalamus infarcts.  CT head and neck showed bilateral ICA bifurcation plaque formation, bilateral PCA and right M2 stenosis.  LDL 100 and A1c 5.7.  RPR and HIV negative.  UDS negative.  Patient stroke still more consistent with small vessel disease given stroke risk factors including smoking, history of stroke, hyperlipidemia and hypertension.  However, 40% AchA infarct can be caused by embolic source.  Recommend 30-day cardio event monitoring to rule out A. fib as outpatient.  Continue aspirin and Plavix dual antiplatelet for 3 weeks and then Plavix alone.  Continue Lipitor 80 for stroke prevention.  Smoking cessation education provided.  Neurology will sign off. Please call with questions. Pt will follow up with stroke clinic NP at [facility] in about 4 weeks. Thanks for the consult.   Rosalin Hawking, MD PhD Stroke Neurology 07/23/2017 5:11 PM   To contact Stroke Continuity provider, please refer to [email or web address] After hours, contact General Neurology

## 2017-07-23 NOTE — Progress Notes (Signed)
PT Cancellation Note  Patient Details Name: Sanjuan Sawa MRN: [id number] DOB: [birth year]   Cancelled Treatment:    Reason Eval/Treat Not Completed: Patient at procedure or test/unavailable. Pt undergoing ECHO in room. PT to return as able.  Kittie Plater, PT, DPT Pager #: [phone] Office #: [phone]    [place] 07/23/2017, 11:56 AM

## 2017-07-23 NOTE — Progress Notes (Signed)
Patient became combative and wishing to leave around 2200 - 2300 called Family Medicine attending and security was determined that patient was a safety risk to himself and attending established IVC for patient. I accompanied SWAT RN to MRI with patient were patient did cooperate with staff and underwent MRI. Will continue to monitor patient.

## 2017-07-23 NOTE — Progress Notes (Signed)
Pt went down for CT via bed

## 2017-07-23 NOTE — Progress Notes (Signed)
  Echocardiogram 2D Echocardiogram has been performed.  Johny Chess 07/23/2017, 12:20 PM

## 2017-07-23 NOTE — Plan of Care (Signed)
  Clinical Measurements: Ability to maintain clinical measurements within normal limits will improve 07/23/2017 0345 - Progressing by Carin Hock I, RN   Activity: Risk for activity intolerance will decrease 07/23/2017 0345 - Progressing by Marcos Eke, RN

## 2017-07-23 NOTE — Consult Note (Signed)
Neurology Consultation Reason for Consult: Stroke Referring Physician: Christianne Borrow  CC: Right-sided weakness  History is obtained from: Patient  HPI: [name] is a 62 y.o. male admitted with altered mental status who was found to have ischemic strokes.  He states that he has been having problems with right-sided weakness for at least a few days.  Since admission, there has been some concern for alcohol withdrawal and he has been somewhat agitated.   LKW: Unclear but at least several days ago tpa given?: no, outside of window    ROS: A 14 point ROS was performed and is negative except as noted in the HPI.   Past Medical History:  Diagnosis Date  . CAD (coronary artery disease) 06/30/2015  . CVA (cerebral vascular accident) ([place])    x4  . GERD (gastroesophageal reflux disease)   . H/O: lung cancer right side  . Headache   . Hepatitis C infection   . History of blood transfusion 1981  . Hypertension 12/28/2010  . Peripheral arterial disease (HCC)    a. s/p PV angiogram on 07/19/15 with successful mid left SFA chronic total occlusion directional atherectomy followed by drug eluting balloon angioplasty using distal protection.  . Small cell lung cancer ([place]) dx;d 2006   a. s/p chemo/xrt comp to 2006     Family History  Problem Relation Age of Onset  . Dementia Mother   . Heart disease Brother   . Cancer Brother        Lung CA  . Heart attack Brother   . Coronary artery disease Brother 2       deceased  . Cancer Brother        Unknown CA  . Colon cancer Neg Hx   . Stomach cancer Neg Hx      Social History:  reports that he has quit smoking. His smoking use included cigarettes. He started smoking about 49 years ago. He has a 1.80 pack-year smoking history. he has never used smokeless tobacco. He reports that he does not drink alcohol or use drugs.   Exam: Current vital signs: BP (!) 99/47 (BP Location: Left Arm)   Pulse (!) 101   Temp 97.7 F (36.5 C)  (Oral)   Resp 20   Ht 5\' 7"  (1.702 m)   Wt 66.8 kg (147 lb 4.3 oz)   SpO2 99%   BMI 23.07 kg/m  Vital signs in last 24 hours: Temp:  [97.7 F (36.5 C)-98.3 F (36.8 C)] 97.7 F (36.5 C) (02/25 0047) Pulse Rate:  [80-105] 101 (02/25 0047) Resp:  [18-20] 20 (02/25 0047) BP: (99-186)/(47-98) 99/47 (02/25 0047) SpO2:  [94 %-100 %] 99 % (02/25 0047) Weight:  [66.8 kg (147 lb 4.3 oz)-68.5 kg (151 lb)] 66.8 kg (147 lb 4.3 oz) (02/24 0558)   Physical Exam  Constitutional: Appears well-developed and well-nourished.  Psych: Affect appropriate to situation Eyes: No scleral injection HENT: No OP obstrucion Head: Normocephalic.  Cardiovascular: Normal rate and regular rhythm.  Respiratory: Effort normal, non-labored breathing GI: Soft.  No distension. There is no tenderness.  Skin: WDI  Neuro: Mental Status: Patient is awake, alert, oriented to person, place, month, year,  Patient is able to give a clear and coherent history. No signs of aphasia or neglect Cranial Nerves: II: Visual Fields are full. Pupils are equal, round, and reactive to light.   III,IV, VI: EOMI without ptosis or diploplia.  V: Facial sensation is symmetric to temperature VII: Facial movement with mildly reduced nasolabial  fold on the right VIII: hearing is intact to voice X: Uvula elevates symmetrically XI: Shoulder shrug is symmetric. XII: tongue is midline without atrophy or fasciculations.  Motor: Tone is normal. Bulk is normal.  He has a right hemiparesis, 4/5.  Sensory: Sensation is symmetric to light touch and temperature in the arms and legs. Cerebellar: He does appear mildly ataxic on the right, though possibly consistent with weakness.  I have reviewed labs in epic and the results pertinent to this consultation are: Normal creatinine LDL 100  I have reviewed the images obtained: MRI brain- 2 areas of small infarct, one in the pons and one in the posterior caudate  Impression: 62 year old male  with likely small vessel infarcts, though multifocal nature does raise the possibility of emboli.  There is a question of micro hemorrhage associated with pontine infarct, could get a better assessment of this with CT when we do his CT angiogram.  Recommendations: 1) he will need more aggressive statin therapy given elevated LDL 2) CTA head neck 3) if no hemorrhage identified on CTA head and neck, would start aspirin 4) frequent neurochecks 5) PT, OT, ST 6) stroke team to follow    Roland Rack, MD [facility] [phone]  If 7pm- 7am, please page neurology on call as listed in [place].

## 2017-07-23 NOTE — Progress Notes (Signed)
Patient becoming agitated about staying in the hospital; explanations given and MD will round and talk to patient; sitter remains at bedside.

## 2017-07-23 NOTE — Progress Notes (Signed)
Physical Therapy Treatment Patient Details Name: [name] MRN: [id number] DOB: September 05, 1955 Today's Date: 07/23/2017    History of Present Illness 62 y.o. male presenting with altered mental status. PMH is significant for Hx of stroke, Hx of small cell lung cancer, HTN, COPD, Anemia, HLD, Tobacco use disorder, and Depression.  Now with new ataxia and right UE weakness and progressing slurring.    PT Comments    Pt with improved sitting balance, ambulation tolerance, and R sided strength. Pt remains however to demo ataxic gait pattern with significant R lateral lean, impaired sequencing and increased falls risk. Acute PT to con't to follow.    Follow Up Recommendations  SNF;Supervision/Assistance - 24 hour     Equipment Recommendations  None recommended by PT    Recommendations for Other Services       Precautions / Restrictions Precautions Precautions: Fall Restrictions Weight Bearing Restrictions: No    Mobility  Bed Mobility Overal bed mobility: Needs Assistance Bed Mobility: Supine to Sit     Supine to sit: Min assist     General bed mobility comments: v/c's for safety, minA to prevent lean to the R  Transfers Overall transfer level: Needs assistance Equipment used: 2 person hand held assist Transfers: Sit to/from Stand Sit to Stand: Mod assist;+2 physical assistance         General transfer comment: pt with lean to the R, impulsively quick to get up. required modA to prevent fall forward as well  Ambulation/Gait Ambulation/Gait assistance: Mod assist;+2 physical assistance Ambulation Distance (Feet): 60 Feet Assistive device: Rolling walker (2 wheeled) Gait Pattern/deviations: Decreased step length - right;Decreased stance time - right;Decreased dorsiflexion - right;Staggering right;Trunk flexed;Narrow base of support Gait velocity: impulsively fast, v/c's to slow down   General Gait Details: pt unable to consitently clear R foot and with  significant adduction of R LE requiring modA to achieve optimal kinematics of R LE. pt given RW 1/2 through ambulation to allow for UE support. pt was able to hold onto walker with R UE today. tech provided walker managment assist while PT was able to provide trunk support to maintain midline and to assist R LE with optimal kinematics   Stairs            Wheelchair Mobility    Modified Rankin (Stroke Patients Only)       Balance Overall balance assessment: Needs assistance Sitting-balance support: No upper extremity supported;Feet supported Sitting balance-Leahy Scale: Fair Sitting balance - Comments: dependent on UEs, v/c's to stay in midline   Standing balance support: Bilateral upper extremity supported;During functional activity Standing balance-Leahy Scale: Poor Standing balance comment: Requires +2 physical assistance.                             Cognition Arousal/Alertness: Awake/alert Behavior During Therapy: Flat affect(was restless earlier this AM) Overall Cognitive Status: No family/caregiver present to determine baseline cognitive functioning Area of Impairment: Orientation;Attention;Memory;Following commands;Safety/judgement;Awareness;Problem solving                 Orientation Level: Disoriented to;Time;Situation Current Attention Level: Sustained Memory: Decreased short-term memory Following Commands: Follows one step commands inconsistently   Awareness: Emergent Problem Solving: Slow processing;Decreased initiation;Difficulty sequencing;Requires verbal cues;Requires tactile cues General Comments: pt with R sided inattention, impaired vision.       Exercises      General Comments        Pertinent Vitals/Pain Pain Assessment: No/denies pain  Home Living       Type of Home: (P) House              Prior Function            PT Goals (current goals can now be found in the care plan section) Progress towards PT goals:  Progressing toward goals    Frequency    Min 3X/week      PT Plan Current plan remains appropriate    Co-evaluation              AM-PAC PT "6 Clicks" Daily Activity  Outcome Measure  Difficulty turning over in bed (including adjusting bedclothes, sheets and blankets)?: A Little Difficulty moving from lying on back to sitting on the side of the bed? : A Little Difficulty sitting down on and standing up from a chair with arms (e.g., wheelchair, bedside commode, etc,.)?: Unable Help needed moving to and from a bed to chair (including a wheelchair)?: A Lot Help needed walking in hospital room?: Total Help needed climbing 3-5 steps with a railing? : Total 6 Click Score: 11    End of Session Equipment Utilized During Treatment: Gait belt Activity Tolerance: Patient tolerated treatment well Patient left: in chair;with call bell/phone within reach;with nursing/sitter in room Nurse Communication: Mobility status PT Visit Diagnosis: Unsteadiness on feet (R26.81);Hemiplegia and hemiparesis;Muscle weakness (generalized) (M62.81);Ataxic gait (R26.0) Hemiplegia - Right/Left: Right Hemiplegia - dominant/non-dominant: Dominant     Time: 1210-1228 PT Time Calculation (min) (ACUTE ONLY): 18 min  Charges:  $Gait Training: 8-22 mins                    G Codes:       Kittie Plater, PT, DPT Pager #: [phone] Office #: [phone]    [place] 07/23/2017, 1:14 PM

## 2017-07-23 NOTE — Progress Notes (Signed)
Received new order.Pt already on OT caseload, will continue to follow.  [place], [place] [phone] 07/23/2017

## 2017-07-23 NOTE — Discharge Summary (Signed)
[facility] Discharge Summary  Patient name: [name] Medical record number: [id number] Date of birth: 1956/01/27 Age: 62 y.o. Gender: male Date of Admission: 07/21/2017  Date of Discharge: 07/26/2017 Admitting Physician: Lind Covert, MD  Primary Care Provider: Zenia Resides, MD Consultants: Neurology, Cardiology  Indication for Hospitalization:   Altered Mental Status Stroke  Discharge Diagnoses/Problem List:   Cerebral thrombosis with cerebral infarction HTN Tobacco use disorder COPD GERD HLD  Disposition: CIR  Discharge Condition: medically stable for d/c to CIR  Discharge Exam:   Gen: Alert and Oriented x 3, NAD HEENT: Normocephalic, atraumatic, PERRLA, EOMI CV: RRR, no murmurs, normal S1, S2 split Resp: Mild wheezing bibasilar, no rales, or rhonchi, comfortable work of breathing Abd: non-distended, non-tender, soft, +bs in all four quadrants Ext: no clubbing, cyanosis, or edema Skin: warm, dry, intact, diffuse erythematous, dry, rash on legs and arms  Brief Hospital Course:  [name] is a 61y/o male with a PMH of previous strokes, HTN, HLD, prediabetes, COPD, GERD, and Depression who was brought in to the ED by his friend due to several days of worsening slurring of speech, blurry vision, headache, and "not acting like himself". On admission to the ED, his neuro exam was significant for right sided weakness, ataxia, and slurring speech with altered mental status. CT w./o contrast showed no hemorrhage or acute ischemic infarct. CTA and MRI of the head showed "acute nonhemorrhagic left basal ganglia to temporal stem infact" with moderate chronic small vessel ischemic disease. Neurology followed with the patient and he was started on ASA and Plavix. Stroke risk stratification labs were obtained and are below. An echocardiogram was performed and the results are below. His blood pressure was found to be above goal  after the 48-72 hour time frame for permissive HTN. He was started on Lisinopril and Indapamide due to evidence of reducing stroke.  After admission the patient became severely agitated and wanted to leave the hospital AMA around 11:30pm on 2/24. I assessed the patient for capacity and he was only oriented to person, could not demonstrate understanding of the risks vs benefits of his leaving the hospital, and could not give me a principled reason for leaving. He also had no way to safely leave the hospital and at that time had not demonstrated that he could safely ambulate without assistance. I involuntary committed the patient due to the reasons stated above. The patient was given Zyprexa to help with his agitation and in the morning he was reasonable and it was determined he had capacity and his IVC was rescinded. He was convinced by his PCP Dr. Andria Frames to stay in the hospital and get inpatient rehabilitation.    On 07/26/2017 he was found to be medically stable and he was discharged to [facility].  Issues for Follow Up:  1. Neurology has started him on dual antiplatelet therapy of ASA 81mg  and Plavix 75mg  daily for 3 months, then he should just take Plavix 75mg  daily indefinetly 2. Neurology recommended a 30 day event monitor to rule out Atrial Fibrillation. Please ensure this has been done. 3. His LDL was found to be elevated and his Atorvastatin was increased from 40mg  to 80mg  daily. Follow up lipid panel is recommended. 4. Patient was started on Lisinopril and Indapamide for HTN to reduce his risk of stroke. Ensure he is taking these as prescribed and that his BP is to goal.   Significant Procedures:   Echocardiogram  Significant Labs and  Imaging:  Recent Labs  Lab 07/22/17 0837 07/23/17 0726 07/24/17 0537  WBC 6.8 6.6 6.7  HGB 15.5 15.0 14.2  HCT 45.9 44.0 41.8  PLT 209 228 199   Recent Labs  Lab 07/21/17 2031 07/21/17 2044 07/22/17 0837 07/23/17 0726  07/24/17 0537 07/25/17 0856  NA 142 140 135 140 140 137  K 4.3 4.2 3.7 3.7 3.9 3.8  CL 105 102 100* 105 109 104  CO2 26  --  23 26 21* 24  GLUCOSE 126* 122* 107* 97 109* 116*  BUN 19 19 14 18  23* 18  CREATININE 0.91 0.90 0.79 1.01 0.85 0.87  CALCIUM 9.7  --  9.1 9.1 9.1 9.3  ALKPHOS 92  --   --   --   --   --   AST 16  --   --   --   --   --   ALT 20  --   --   --   --   --   ALBUMIN 4.8  --   --   --   --   --    TSH 1.464 Ethanol <10 UDS neg UA 5 ketones HbA1c pending  Imaging/Diagnostic Tests: Dg Chest 2 View 07/21/2017  IMPRESSION: No active cardiopulmonary disease.    Ct Head Wo Contrast 07/21/2017 CT and MR FINDINGS: Brain: No evidence of acute infarction, hemorrhage, hydrocephalus, extra-axial collection or mass lesion/mass effect. Atrophy, chronic small-vessel white matter ischemic changes and remote LEFT thalamic and LEFT caudate infarcts noted.  Vascular: Atherosclerotic calcifications identified.  Skull: No acute abnormality  Sinuses/Orbits: No acute abnormality  IMPRESSION:  1. No evidence of acute intracranial abnormality  2. Atrophy, chronic small-vessel white matter ischemic changes and remote LEFT thalamic and LEFT caudate infarcts.   MRI Brain IMPRESSION: 1. Limited motion degraded examination, patient could not complete examination. 2. Acute LEFT caudate to temporal stem nonhemorrhagic infarct. Acute LEFT pontine lacunar infarct with possible microhemorrhage. 3. Old RIGHT cerebellar, LEFT basal ganglia and LEFT thalamus infarcts. 4. Moderate parenchymal brain volume loss, advanced for age. 5. Moderate chronic small vessel ischemic disease.  CTA IMPRESSION: CT HEAD:  1. Evolving acute nonhemorrhagic LEFT basal ganglia to temporal stem infarct. 2. Old RIGHT cerebellar, pontine, LEFT basal ganglia and LEFT thalamus infarcts. 3. Moderate parenchymal brain volume loss, advanced for age. 4. Moderate chronic small vessel ischemic disease.  CTA  NECK:  1. No hemodynamically significant stenosis ICA. 2. Moderate stenosis proximal LEFT subclavian artery.  CTA HEAD:  1. No emergent large vessel occlusion. 2. Atherosclerosis resulting in moderate to severe stenoses bilateral posterior cerebral arteries. 3. Moderate stenosis RIGHT M2 segment.  2/25 - ECHO - Left ventricle: The cavity size was normal. Systolic function was normal. The estimated ejection fraction was in the range of 55% to 60%. - Mitral valve: Calcified annulus. Mildly thickened leaflets .  Results/Tests Pending at Time of Discharge:   30-day event monitor  Discharge Medications:  Allergies as of 07/26/2017   No Known Allergies     Medication List    TAKE these medications   albuterol 108 (90 Base) MCG/ACT inhaler Commonly known as:  PROVENTIL HFA;VENTOLIN HFA Inhale 2 puffs into the lungs every 6 (six) hours as needed for wheezing or shortness of breath. Only use with chest tightness.   aspirin EC 81 MG tablet Take 1 tablet (81 mg total) daily by mouth.   atorvastatin 80 MG tablet Commonly known as:  LIPITOR Take 1 tablet (80 mg total) by mouth daily.  Start taking on:  07/27/2017 What changed:    medication strength  how much to take   buPROPion 150 MG 24 hr tablet Commonly known as:  WELLBUTRIN XL Take 1 tablet (150 mg total) by mouth daily.   clopidogrel 75 MG tablet Commonly known as:  PLAVIX Take 1 tablet (75 mg total) by mouth daily. Start taking on:  07/27/2017   Fluticasone-Umeclidin-Vilant 100-62.5-25 MCG/INH Aepb Commonly known as:  TRELEGY ELLIPTA Inhale 1 puff daily into the lungs.   gabapentin 300 MG capsule Commonly known as:  NEURONTIN Take 300 mg by mouth 3 (three) times daily.   indapamide 1.25 MG tablet Commonly known as:  LOZOL Take 1 tablet (1.25 mg total) by mouth daily. Start taking on:  07/27/2017   lisinopril 2.5 MG tablet Commonly known as:  PRINIVIL,ZESTRIL Take 1 tablet (2.5 mg total) by mouth  daily. Start taking on:  07/27/2017   nicotine 21 mg/24hr patch Commonly known as:  NICODERM CQ - dosed in mg/24 hours Place 1 patch (21 mg total) onto the skin daily. Start taking on:  07/27/2017   nicotine polacrilex 4 MG lozenge Commonly known as:  COMMIT Take 1 lozenge (4 mg total) by mouth every 2 (two) hours as needed for smoking cessation.   omeprazole 40 MG capsule Commonly known as:  PRILOSEC Take 1 capsule (40 mg total) by mouth daily.   triamcinolone cream 0.5 % Commonly known as:  KENALOG Apply 1 application topically 3 (three) times daily.   umeclidinium bromide 62.5 MCG/INH Aepb Commonly known as:  INCRUSE ELLIPTA Inhale 1 puff into the lungs daily. Start taking on:  07/27/2017            Durable Medical Equipment  (From admission, onward)        Start     Ordered   07/24/17 1402  For home use only DME Walker rolling  Once    Question:  Patient needs a walker to treat with the following condition  Answer:  CVA (cerebral vascular accident) [facility])   07/24/17 1401      Discharge Instructions: Please refer to Patient Instructions section of EMR for full details.  Patient was counseled important signs and symptoms that should prompt return to medical care, changes in medications, dietary instructions, activity restrictions, and follow up appointments.   Follow-Up Appointments:   Nuala Alpha, DO 07/26/2017, 12:33 PM PGY-1, [place]

## 2017-07-24 ENCOUNTER — Other Ambulatory Visit: Payer: Self-pay

## 2017-07-24 ENCOUNTER — Encounter (HOSPITAL_COMMUNITY): Payer: Self-pay | Admitting: Family Medicine

## 2017-07-24 DIAGNOSIS — F17200 Nicotine dependence, unspecified, uncomplicated: Secondary | ICD-10-CM

## 2017-07-24 DIAGNOSIS — Z720 Tobacco use: Secondary | ICD-10-CM | POA: Diagnosis present

## 2017-07-24 DIAGNOSIS — J449 Chronic obstructive pulmonary disease, unspecified: Secondary | ICD-10-CM

## 2017-07-24 DIAGNOSIS — B1920 Unspecified viral hepatitis C without hepatic coma: Secondary | ICD-10-CM

## 2017-07-24 DIAGNOSIS — I10 Essential (primary) hypertension: Secondary | ICD-10-CM

## 2017-07-24 DIAGNOSIS — R270 Ataxia, unspecified: Secondary | ICD-10-CM

## 2017-07-24 DIAGNOSIS — Z87898 Personal history of other specified conditions: Secondary | ICD-10-CM

## 2017-07-24 DIAGNOSIS — R7303 Prediabetes: Secondary | ICD-10-CM

## 2017-07-24 DIAGNOSIS — K219 Gastro-esophageal reflux disease without esophagitis: Secondary | ICD-10-CM

## 2017-07-24 DIAGNOSIS — Z8673 Personal history of transient ischemic attack (TIA), and cerebral infarction without residual deficits: Secondary | ICD-10-CM

## 2017-07-24 DIAGNOSIS — I2510 Atherosclerotic heart disease of native coronary artery without angina pectoris: Secondary | ICD-10-CM

## 2017-07-24 LAB — BASIC METABOLIC PANEL
ANION GAP: 10 (ref 5–15)
BUN: 23 mg/dL — ABNORMAL HIGH (ref 6–20)
CHLORIDE: 109 mmol/L (ref 101–111)
CO2: 21 mmol/L — ABNORMAL LOW (ref 22–32)
CREATININE: 0.85 mg/dL (ref 0.61–1.24)
Calcium: 9.1 mg/dL (ref 8.9–10.3)
GFR calc non Af Amer: >60 mL/min (ref >60)
GLUCOSE: 109 mg/dL — AB (ref 65–99)
Potassium: 3.9 mmol/L (ref 3.5–5.1)
Sodium: 140 mmol/L (ref 135–145)

## 2017-07-24 LAB — CBC
HCT: 41.8 % (ref 39.0–52.0)
HEMOGLOBIN: 14.2 g/dL (ref 13.0–17.0)
MCH: 31.4 pg (ref 26.0–34.0)
MCHC: 34 g/dL (ref 30.0–36.0)
MCV: 92.5 fL (ref 78.0–100.0)
Platelets: 199 10*3/uL (ref 150–400)
RBC: 4.52 MIL/uL (ref 4.22–5.81)
RDW: 14.7 % (ref 11.5–15.5)
WBC: 6.7 10*3/uL (ref 4.0–10.5)

## 2017-07-24 MED ORDER — LISINOPRIL 2.5 MG PO TABS
2.5000 mg | ORAL_TABLET | Freq: Every day | ORAL | Status: DC
  Administered 2017-07-24 – 2017-07-26 (×3): 2.5 mg via ORAL
  Filled 2017-07-24 (×3): qty 1

## 2017-07-24 MED ORDER — INDAPAMIDE 1.25 MG PO TABS
1.2500 mg | ORAL_TABLET | Freq: Every day | ORAL | Status: DC
  Administered 2017-07-24 – 2017-07-26 (×3): 1.25 mg via ORAL
  Filled 2017-07-24 (×3): qty 1

## 2017-07-24 MED ORDER — ENSURE ENLIVE PO LIQD
237.0000 mL | Freq: Two times a day (BID) | ORAL | Status: DC
  Administered 2017-07-25 – 2017-07-26 (×3): 237 mL via ORAL

## 2017-07-24 NOTE — Progress Notes (Addendum)
Physical Therapy Treatment Patient Details Name: [name] MRN: [id number] DOB: 04-30-1956 Today's Date: 07/24/2017    History of Present Illness 62 y.o. male presenting with altered mental status. PMH is significant for Hx of stroke, Hx of small cell lung cancer, HTN, COPD, Anemia, HLD, Tobacco use disorder, and Depression.  Now with new ataxia and right UE weakness and progressing slurring.    PT Comments    Pt much improved both physically and cognitively. Pt with improved command following and ability to problem solve. Pt followed each verbal cue provided by PT to optimize gait kinematics and demo'd significant improvement from initial eval. Pt remains to have R sided neglect, R sided weakness, ataxia, impaired processing, sequencing, and memory. Pt to strongly benefit from CIR upon d/c to return to indep function.   Follow Up Recommendations  CIR     Equipment Recommendations  Rolling walker with 5" wheels    Recommendations for Other Services Rehab consult     Precautions / Restrictions Precautions Precautions: Fall Restrictions Weight Bearing Restrictions: No    Mobility  Bed Mobility Overal bed mobility: Needs Assistance Bed Mobility: Supine to Sit     Supine to sit: Min assist     General bed mobility comments: pt con't to not use R UE to assist with transfer due to inattention but when given v/c's to use R UE pt will use it. labored effort when not using R UE  Transfers Overall transfer level: Needs assistance Equipment used: Rolling walker (2 wheeled) Transfers: Sit to/from Stand Sit to Stand: Min assist;Mod assist;+2 physical assistance         General transfer comment: pt impulsive and immeadiatly attempted to pull up on RW and was unsuccesful but with v/c's to push up from bed and to slow down pt able to transfer with minA in a controlled mannor  Ambulation/Gait Ambulation/Gait assistance: Mod assist;+2 physical assistance Ambulation Distance  (Feet): 120 Feet Assistive device: Rolling walker (2 wheeled) Gait Pattern/deviations: Step-through pattern;Decreased stride length;Decreased stance time - right;Decreased step length - right;Decreased weight shift to left;Ataxic;Narrow base of support Gait velocity: appropriate with v/c's Gait velocity interpretation: Below normal speed for age/gender General Gait Details: tech in front to assist with walker management and navigation. PT behind pt providing tactile cues at hip to promote L weight shift and anterior R hip cues to emphasize hip flexion to consistantly clear R foot. modA also provided at R LE to prevent significant adduction during swing phase. Pt was ablel to consistantly follow commands and did not get frustrated with PT. much improved from yesterday   Stairs            Wheelchair Mobility    Modified Rankin (Stroke Patients Only) Modified Rankin (Stroke Patients Only) Pre-Morbid Rankin Score: No symptoms Modified Rankin: Moderately severe disability     Balance Overall balance assessment: Needs assistance Sitting-balance support: No upper extremity supported Sitting balance-Leahy Scale: Fair     Standing balance support: Bilateral upper extremity supported Standing balance-Leahy Scale: Poor Standing balance comment: Requires +2 physical assistance.                             Cognition Arousal/Alertness: Awake/alert Behavior During Therapy: Flat affect Overall Cognitive Status: No family/caregiver present to determine baseline cognitive functioning Area of Impairment: Memory;Following commands;Safety/judgement;Awareness;Problem solving                   Current Attention Level: Sustained Memory:  Decreased short-term memory Following Commands: Follows one step commands inconsistently;Follows multi-step commands inconsistently Safety/Judgement: Decreased awareness of safety Awareness: Emergent Problem Solving: Slow processing;Decreased  initiation;Difficulty sequencing;Requires verbal cues;Requires tactile cues General Comments: Pt con't to have R sided inattention. Pt with improved processing this date. Pt able to identify elevator and how you would navigate between floors, ie. when asked how to get from the 3rd floor to the 5th floor pt answered appropriately by stating pressing the up arrow.      Exercises Other Exercises Other Exercises: worked on R LE slow and controlled movement in both sitting and standing to help manage ataxia    General Comments        Pertinent Vitals/Pain Pain Assessment: No/denies pain    Home Living                      Prior Function            PT Goals (current goals can now be found in the care plan section) Progress towards PT goals: Progressing toward goals    Frequency    Min 4X/week      PT Plan Discharge plan needs to be updated    Co-evaluation              AM-PAC PT "6 Clicks" Daily Activity  Outcome Measure                   End of Session Equipment Utilized During Treatment: Gait belt Activity Tolerance: Patient tolerated treatment well Patient left: in chair;with call bell/phone within reach;with chair alarm set;with nursing/sitter in room Nurse Communication: Mobility status PT Visit Diagnosis: Unsteadiness on feet (R26.81);Difficulty in walking, not elsewhere classified (R26.2);Ataxic gait (R26.0) Hemiplegia - Right/Left: Right Hemiplegia - dominant/non-dominant: Dominant     Time: 0233-4356 PT Time Calculation (min) (ACUTE ONLY): 23 min  Charges:  $Gait Training: 8-22 mins $Neuromuscular Re-education: 8-22 mins                    G Codes:       Kittie Plater, PT, DPT Pager #: [phone] Office #: [phone]    [place] 07/24/2017, 11:35 AM

## 2017-07-24 NOTE — Consult Note (Signed)
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 Modification listed below:   Antiplatelet therapy:   Blood Pressure Management:  Continue current medication with prn's with permisive HTN per primary team Statin Agent:   Prediabetes management:   Tobacco abuse:    1. Does the need for close, 24 hr/day medical supervision in concert with the patient's rehab needs make it unreasonable for this patient to be served in a less intensive setting? Yes  2. Co-Morbidities requiring supervision/potential complications: CAD (cont meds), Hep C (avoid hepatotoxic meds), GERD (cont meds), COPD (monitor RR and O2 sats with increased mobility), renal/lung cancer, multiple prior strokes, non-compliance (counsel), tobacco abuse (counsel), HTN (monitor and provide prns in accordance with increased physical exertion and pain), prediabetes (Monitor in accordance with exercise and adjust meds as necessary) 3. Due to safety, skin/wound care, disease management, medication administration and patient education, does the patient require 24 hr/day rehab nursing? Yes 4. Does the patient require coordinated care of a physician, rehab nurse, PT (1-2 hrs/day, 5 days/week), OT  (1-2 hrs/day, 5 days/week) and SLP (1-2 hrs/day, 5 days/week) to address physical and functional deficits in the context of the above medical diagnosis(es)? Yes Addressing deficits in the following areas: balance, endurance, locomotion, strength, transferring, bathing, dressing, toileting, cognition and psychosocial support 5. Can the patient actively participate in an intensive therapy program of at least 3 hrs of therapy per day at least 5 days per week? Yes 6. The potential for patient to make measurable gains while on inpatient rehab is excellent 7. Anticipated functional outcomes upon discharge from inpatient rehab are supervision and min assist  with PT, supervision and min assist with OT, supervision and min assist with SLP. 8. Estimated rehab length of stay to reach the above functional goals is: 15-19 days. 9. Anticipated D/C setting: Home 10. Anticipated post D/C treatments: HH therapy and Home excercise program 11. Overall Rehab/Functional Prognosis: good  RECOMMENDATIONS: This patient's condition is appropriate for continued rehabilitative care in the following setting: CIR Patient has agreed to participate in recommended program. Yes Note that insurance prior authorization may be required for reimbursement for recommended care.  Comment: Rehab Admissions Coordinator to follow up.  Delice Lesch, MD, ABPMR Bary Leriche, PA-C 07/24/2017

## 2017-07-24 NOTE — Clinical Social Work Note (Signed)
Clinical Social Work Assessment  Patient Details  Name: [name] MRN: [id number] Date of Birth: 1955-11-29  Date of referral:  07/24/17               Reason for consult:  Facility Placement                Permission sought to share information with:  Facility Sport and exercise psychologist, Family Supports Permission granted to share information::  Yes, Verbal Permission Granted  Name::     Community education officer::  SNF  Relationship::  Sister  Contact Information:     Housing/Transportation Living arrangements for the past 2 months:  Tour manager of Information:  Patient Patient Interpreter Needed:  None Criminal Activity/Legal Involvement Pertinent to Current Situation/Hospitalization:  No - Comment as needed Significant Relationships:  Friend, Siblings Lives with:  Self, Friends Do you feel safe going back to the place where you live?  Yes Need for family participation in patient care:  No (Coment)  Care giving concerns:  Patient lives at home with friends who check on him, but he was independent prior to admission. Patient would benefit from short term rehab to improve mobility and ability to care for himself at discharge.   Social Worker assessment / plan:  CSW met with patient to discuss recommendation for rehab at discharge, and potential for looking into SNF for rehab. CSW validated patient's frustrations and concerns, and discussed that it's the doctor's recommendation before he goes home just to get rehab for a little while. CSW wrote down patient's sister's and friend's phone number so that he can get in touch with them. CSW received permission to look into SNF for patient. CSW tried to explain the referral process and give an estimated time frame for the patient, but he was agitated and did not appear receptive to information. Per NT at bedside, the patient has been pleasant all day and this is the first sign of agitation that he's shown so far today. CSW will follow up  later.   Employment status:  Retired Nurse, adult PT Recommendations:  Inpatient [place] / Referral to community resources:  [place]  Patient/Family's Response to care:  Patient is not happy about still being in the hospital and not happy about the recommendation for rehab. Patient wants to be able to go home, but is going along with what the doctors recommend because "they'll just keep me here anyway, so I don't have a choice".  Patient/Family's Understanding of and Emotional Response to Diagnosis, Current Treatment, and Prognosis:  Patient is frustrated and feels like he's "in prison". Patient doesn't think he needs rehab, he says he feels fine. Patient says he knows he doesn't have a choice, because the doctors will just force him to stay and do what they want like they did before. Patient is agreeable to going to rehab "because he doesn't have a choice", but doesn't want to have to stay long. Patient doesn't believe that he's going to be able to go anytime soon, thinks that it's going to take weeks and we'll keep coming back and telling him "we're working on it".   Emotional Assessment Appearance:  Appears stated age Attitude/Demeanor/Rapport:  Aggressive (Verbally and/or physically), Complaining Affect (typically observed):  Agitated, Defensive, Frustrated Orientation:  Oriented to Self, Oriented to Place, Oriented to  Time, Oriented to Situation Alcohol / Substance use:  Not Applicable Psych involvement (Current and /or in the community):  No (Comment)  Discharge  Needs  Concerns to be addressed:  Care Coordination Readmission within the last 30 days:  No Current discharge risk:  Dependent with Mobility, Physical Impairment Barriers to Discharge:  Continued Medical Work up, [place], [place] 07/24/2017, 2:52 PM

## 2017-07-24 NOTE — Progress Notes (Addendum)
Family Medicine Teaching Service Daily Progress Note Intern Pager: [phone]  Patient name: [name] Medical record number: [id number] Date of birth: 1955/08/03 Age: 62 y.o. Gender: male  Primary Care Provider: Zenia Resides, MD Consultants: None Code Status: Full  Pt Overview and Major Events to Date:  2/24: Admit for AMS; stroke vs. Intoxication vs. Withdrawal; CT head neg  Assessment and Plan: [name] is a 62 y.o. male presenting with altered mental status. PMH is significant for Hx of stroke, Hx of small cell lung cancer, HTN, COPD, Anemia, HLD, Tobacco use disorder, and Depression.  Acute Infarct: Acute. 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 AMS resolved. Still has speech issues (but had some from previous stroke) and difficulty with gait. MRI shows small vessel infarcts, though multifocal nature does raise the possibility of emboli. Echo showed LVEF of 55-60% with mild mitral valve leaflet thickening. - Reassessed for capacity and will d/c IVC today; patient has agreed to SNF. - Social work consult for SNF - Neurology following; appreciate recs - Cont ASA 81mg  and Plavix 75mg  x 3 months; then Plavix only - PT/OT - Cont to hold sedating meds - CIWA  HTN: Patient is now past time period of permissive HTN and has been above goal. Starting a thiazide diuretic and an ACEI as they have been shown to prevent future stroke. - Start Indapamide 1.25mg  daily - Start Lisinopril 2.5mg  daily  Diffuse Rash, stable: Acute. Stable. No complaints this am (2/26). Eczema vs bed bugs. Patient was recently seen outpt for rash on upper and lower extremities.  - Contact precautions - Cont Kenalog cream - Claritin 10mg  daily - follow up outpatient  COPD: Chronic, stable, not currently in exacerbation. Home meds include Albuterol and Trelegy - Cont home trelegy - duonebs q6 as needed  HLD: Chronic, stable  On Atorvastatin 40mg . Lipid panel showed LDL of 100.  Consider increasing to high intensity statin. - Cont atorvastatin 80mg  daily  GERD: Chronic, stable. On omeprazole 40mg  daily at home. - Cont PPI, Protonix 40mg  daily  Depression: Chronic. On Buproprion 150mg  XL daily. - Hold until AMS clears for potential sedation  Neuropathy: Chronic, stable. On Gabapentin 300mg  TID at home. - Hold until AMS clears for potential sedation  Tobacco Use Disorder: Chronic.  - Nicotine patch 21mg  daily  FEN/GI: NPO Prophylaxis: Lovenox  Disposition: Anticipate will need 1-2 further days of hospitalization to work up AMS  Subjective:  Patient is sitting up in bed with breakfast tray. He states he "feels like himself" and he wants to go home. Explained to the patient that he had a stroke and he would greatly benefit from going to SNF to help rehab him before going home. Patient refuses stating "I don't want to go, I just want to go home". He expressed understanding of the risks of going home without rehab.  Went back into the room with the much appreciated help of his PCP, Dr. Andria Frames who convinced patient to stay. Patient agrees to stay and get sent from hospital to SNF.  Objective: Temp:  [97.1 F (36.2 C)-98.6 F (37 C)] 98.6 F (37 C) (02/26 0350) Pulse Rate:  [85-102] 90 (02/26 0350) Resp:  [16-20] 18 (02/26 0350) BP: (111-181)/(71-90) 181/87 (02/26 0350) SpO2:  [91 %-100 %] 96 % (02/26 0350) Physical Exam: Gen: Alert and Oriented x 3, NAD HEENT: Normocephalic, atraumatic, PERRLA, EOMI CV: RRR, no murmurs, normal S1, S2 split Resp: CTAB, no wheezing, rales, or rhonchi, comfortable work of breathing Abd:  non-distended, non-tender, soft, +bs in all four quadrants MSK: FROM in all four extremities Ext: no clubbing, cyanosis, or edema Skin: warm, dry, intact, dry erythematous rash on extremities  Laboratory: Recent Labs  Lab 07/22/17 0837 07/23/17 0726 07/24/17 0537  WBC 6.8 6.6 6.7  HGB 15.5 15.0 14.2  HCT 45.9 44.0 41.8  PLT 209  228 199   Recent Labs  Lab 07/21/17 2031  07/22/17 0837 07/23/17 0726 07/24/17 0537  NA 142   < > 135 140 140  K 4.3   < > 3.7 3.7 3.9  CL 105   < > 100* 105 109  CO2 26  --  23 26 21*  BUN 19   < > 14 18 23*  CREATININE 0.91   < > 0.79 1.01 0.85  CALCIUM 9.7  --  9.1 9.1 9.1  PROT 8.1  --   --   --   --   BILITOT 0.9  --   --   --   --   ALKPHOS 92  --   --   --   --   ALT 20  --   --   --   --   AST 16  --   --   --   --   GLUCOSE 126*   < > 107* 97 109*   < > = values in this interval not displayed.   Significant labs this admission: Lipid Panel     Component Value Date/Time   CHOL 158 07/22/2017 0559   TRIG 95 07/22/2017 0559   HDL 39 (L) 07/22/2017 0559   CHOLHDL 4.1 07/22/2017 0559   VLDL 19 07/22/2017 0559   LDLCALC 100 (H) 07/22/2017 0559   TSH 1.464 Ethanol <10 UDS neg UA 5 ketones HbA1c pending   Imaging/Diagnostic Tests: Dg Chest 2 View 07/21/2017  IMPRESSION: No active cardiopulmonary disease.    Ct Head Wo Contrast 07/21/2017 CT and MR FINDINGS: Brain: No evidence of acute infarction, hemorrhage, hydrocephalus, extra-axial collection or mass lesion/mass effect. Atrophy, chronic small-vessel white matter ischemic changes and remote LEFT thalamic and LEFT caudate infarcts noted.  Vascular: Atherosclerotic calcifications identified.  Skull: No acute abnormality  Sinuses/Orbits: No acute abnormality  IMPRESSION:  1. No evidence of acute intracranial abnormality  2. Atrophy, chronic small-vessel white matter ischemic changes and remote LEFT thalamic and LEFT caudate infarcts.   MRI Brain IMPRESSION: 1. Limited motion degraded examination, patient could not complete examination. 2. Acute LEFT caudate to temporal stem nonhemorrhagic infarct. Acute LEFT pontine lacunar infarct with possible microhemorrhage. 3. Old RIGHT cerebellar, LEFT basal ganglia and LEFT thalamus infarcts. 4. Moderate parenchymal brain volume loss, advanced for age. 5. Moderate  chronic small vessel ischemic disease.  CTA IMPRESSION: CT HEAD:  1. Evolving acute nonhemorrhagic LEFT basal ganglia to temporal stem infarct. 2. Old RIGHT cerebellar, pontine, LEFT basal ganglia and LEFT thalamus infarcts. 3. Moderate parenchymal brain volume loss, advanced for age. 4. Moderate chronic small vessel ischemic disease.  CTA NECK:  1. No hemodynamically significant stenosis ICA. 2. Moderate stenosis proximal LEFT subclavian artery.  CTA HEAD:  1. No emergent large vessel occlusion. 2. Atherosclerosis resulting in moderate to severe stenoses bilateral posterior cerebral arteries. 3. Moderate stenosis RIGHT M2 segment.  2/25 - ECHO - Left ventricle: The cavity size was normal. Systolic function was   normal. The estimated ejection fraction was in the range of 55%   to 60%. - Mitral valve: Calcified annulus. Mildly thickened leaflets .  Nuala Alpha,  DO 07/24/2017, 6:57 AM PGY-1, [place] Intern pager: [phone], text pages welcome

## 2017-07-24 NOTE — NC FL2 (Signed)
[place] LEVEL OF CARE SCREENING TOOL     IDENTIFICATION  Patient Name: [name] Birthdate: [birth year] Sex: male Admission Date (Current Location): 07/21/2017  [facility] and [place] Number:  Herbalist and Address:  [facility] [place], [place] [address], [place], [place] [address]      Provider Number: [id number]  Attending Physician Name and Address:  Lind Covert, MD  Relative Name and Phone Number:  [name], sister, [phone]    Current Level of Care: Hospital Recommended Level of Care: [place] Prior Approval Number:    Date Approved/Denied:   PASRR Number: [id number]  Discharge Plan: SNF    Current Diagnoses: Patient Active Problem List   Diagnosis Date Noted  . Cerebral thrombosis with cerebral infarction 07/23/2017  . Agitation   . History of alcohol abuse   . Altered mental status, unspecified 07/22/2017  . Ataxia   . Weakness   . Anemia, iron deficiency 04/04/2017  . 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 Decreased hearing of right ear 02/16/2016  . Small cell lung cancer ([place])   . Peripheral arterial disease ([place])   . GERD (gastroesophageal reflux disease)   . Hepatitis C, chronic ([place]) 06/16/2015  . Major depressive disorder, single episode, moderate ([place]) 04/09/2015  . Pain of right lower leg 04/08/2015  . Unspecified hereditary and idiopathic peripheral neuropathy 10/17/2013  . Urinary incontinence 12/26/2012  . Erectile dysfunction 03/07/2012  . Loss of weight 06/30/2011  . COPD exacerbation ([place]) 05/19/2011  . History of CVA (cerebrovascular accident) 12/28/2010  . Hyperlipidemia 12/28/2010  . Hypertension 12/28/2010  . Allergic rhinitis 09/09/2010  . MIGRAINE HEADACHE 01/11/2010  . Smoker 09/10/2009  . LOW BACK PAIN 11/27/2008  . COLONIC POLYPS, ADENOMATOUS, HX OF 02/24/2008  . NECK PAIN 11/15/2007  . RENAL CALCULUS, RIGHT 04/08/2007  . COPD, moderate ([place])  08/13/2006  . INSOMNIA NOS 07/26/2006    Orientation RESPIRATION BLADDER Height & Weight     Self, Situation, Time, Place  Normal Incontinent, External catheter Weight: 66.8 kg (147 lb 4.3 oz) Height:  5\' 7"  (170.2 cm)  BEHAVIORAL SYMPTOMS/MOOD NEUROLOGICAL BOWEL NUTRITION STATUS      Incontinent Diet(Please see DC Summary)  AMBULATORY STATUS COMMUNICATION OF NEEDS Skin   Limited Assist Verbally Normal                       Personal Care Assistance Level of Assistance  Bathing, Feeding, Dressing Bathing Assistance: Limited assistance Feeding assistance: Independent Dressing Assistance: Limited assistance     Functional Limitations Info             SPECIAL CARE FACTORS FREQUENCY  PT (By licensed PT)     PT Frequency: 5x/week              Contractures      Additional Factors Info  Code Status, Allergies Code Status Info: Full Allergies Info: NKA           Current Medications (07/24/2017):  This is the current hospital active medication list Current Facility-Administered Medications  Medication Dose Route Frequency Provider Last Rate Last Dose  .  stroke: mapping our early stages of recovery book   Does not apply Once Greta Doom, MD      . acetaminophen (TYLENOL) tablet 650 mg  650 mg Oral Q4H PRN Lockamy, Timothy, DO   650 mg at 07/23/17 1210   Or  . acetaminophen (TYLENOL) solution 650 mg  650 mg  Per Tube Q4H PRN Nuala Alpha, DO       Or  . acetaminophen (TYLENOL) suppository 650 mg  650 mg Rectal Q4H PRN Lockamy, Timothy, DO      . aspirin EC tablet 81 mg  81 mg Oral Daily Rosalin Hawking, MD   81 mg at 07/24/17 3338  . atorvastatin (LIPITOR) tablet 80 mg  80 mg Oral Daily Candise Che A, NP   80 mg at 07/24/17 0929  . clopidogrel (PLAVIX) tablet 75 mg  75 mg Oral Daily Rosalin Hawking, MD   75 mg at 07/24/17 0929  . fluticasone furoate-vilanterol (BREO ELLIPTA) 200-25 MCG/INH 1 puff  1 puff Inhalation Daily Lind Covert, MD   1  puff at 07/24/17 1039  . folic acid (FOLVITE) tablet 1 mg  1 mg Oral Daily Everrett Coombe, MD   1 mg at 07/24/17 3291  . hydrALAZINE (APRESOLINE) injection 10 mg  10 mg Intravenous Q4H PRN Lockamy, Timothy, DO      . indapamide (LOZOL) tablet 1.25 mg  1.25 mg Oral Daily Abraham, Sherin, DO      . ipratropium-albuterol (DUONEB) 0.5-2.5 (3) MG/3ML nebulizer solution 3 mL  3 mL Nebulization Q6H PRN Chambliss, Jeb Levering, MD      . lisinopril (PRINIVIL,ZESTRIL) tablet 2.5 mg  2.5 mg Oral Daily Abraham, Sherin, DO      . loratadine (CLARITIN) tablet 10 mg  10 mg Oral Daily Lockamy, Timothy, DO   10 mg at 07/24/17 0929  . multivitamin with minerals tablet 1 tablet  1 tablet Oral Daily Everrett Coombe, MD   1 tablet at 07/24/17 0929  . nicotine (NICODERM CQ - dosed in mg/24 hours) patch 21 mg  21 mg Transdermal Daily Lockamy, Timothy, DO   21 mg at 07/24/17 9166  . pantoprazole (PROTONIX) EC tablet 40 mg  40 mg Oral Daily Everrett Coombe, MD   40 mg at 07/24/17 0929  . thiamine (VITAMIN B-1) tablet 100 mg  100 mg Oral Daily Everrett Coombe, MD   100 mg at 07/24/17 0600   Or  . thiamine (B-1) injection 100 mg  100 mg Intravenous Daily Everrett Coombe, MD      . triamcinolone cream (KENALOG) 0.5 % 1 application  1 application Topical TID Nuala Alpha, DO   1 application at 45/99/77 2024  . umeclidinium bromide (INCRUSE ELLIPTA) 62.5 MCG/INH 1 puff  1 puff Inhalation Daily Lind Covert, MD   1 puff at 07/24/17 1040     Discharge Medications: Please see discharge summary for a list of discharge medications.  Relevant Imaging Results:  Relevant Lab Results:   Additional Information SSN: [place], [place]

## 2017-07-24 NOTE — Progress Notes (Signed)
Rehab Admissions Coordinator Note:  Patient was screened by Cleatrice Burke for appropriateness for an Inpatient Acute Rehab Consult per change in therapy recommendation today. At this time, we are recommending Inpatient Rehab consult.  Cleatrice Burke 07/24/2017, 12:13 PM  I can be reached at [phone].

## 2017-07-25 ENCOUNTER — Other Ambulatory Visit: Payer: Self-pay | Admitting: Cardiology

## 2017-07-25 DIAGNOSIS — I639 Cerebral infarction, unspecified: Secondary | ICD-10-CM

## 2017-07-25 LAB — BASIC METABOLIC PANEL
ANION GAP: 9 (ref 5–15)
BUN: 18 mg/dL (ref 6–20)
CALCIUM: 9.3 mg/dL (ref 8.9–10.3)
CO2: 24 mmol/L (ref 22–32)
Chloride: 104 mmol/L (ref 101–111)
Creatinine, Ser: 0.87 mg/dL (ref 0.61–1.24)
GFR calc Af Amer: >60 mL/min (ref >60)
Glucose, Bld: 116 mg/dL — ABNORMAL HIGH (ref 65–99)
Potassium: 3.8 mmol/L (ref 3.5–5.1)
Sodium: 137 mmol/L (ref 135–145)

## 2017-07-25 MED ORDER — NICOTINE POLACRILEX 4 MG MT LOZG
4.0000 mg | LOZENGE | OROMUCOSAL | Status: DC | PRN
  Filled 2017-07-25: qty 1

## 2017-07-25 MED ORDER — BUPROPION HCL ER (XL) 150 MG PO TB24
150.0000 mg | ORAL_TABLET | Freq: Every day | ORAL | Status: DC
  Administered 2017-07-25 – 2017-07-26 (×2): 150 mg via ORAL
  Filled 2017-07-25: qty 1

## 2017-07-25 NOTE — Progress Notes (Signed)
Physical Therapy Treatment Patient Details Name: [name] MRN: [id number] DOB: 05/15/1956 Today's Date: 07/25/2017    History of Present Illness 62 y.o. male presenting with altered mental status. PMH is significant for Hx of stroke, Hx of small cell lung cancer, HTN, COPD, Anemia, HLD, Tobacco use disorder, and Depression.  Now with new ataxia and right UE weakness and progressing slurring.    PT Comments    Pt with c/o "I dont feel well today." Pt with decreased activity tolerance and further impaired balance today compared to yesterday however demonstrates improved cognition and ability to manage R LE during ambulation. Pt did begin to use R UE to try to eat lunch as well. Per sitter pt's BP slightly elevated. Acute PT to con't to follow.   Follow Up Recommendations  CIR     Equipment Recommendations  Rolling walker with 5" wheels    Recommendations for Other Services Rehab consult     Precautions / Restrictions Precautions Precautions: Fall Restrictions Weight Bearing Restrictions: No    Mobility  Bed Mobility Overal bed mobility: Needs Assistance Bed Mobility: Supine to Sit     Supine to sit: Min assist     General bed mobility comments: pt con't to "forget" to use R UE. With verbal cues pt will use R UE to pull self up to sitting EOB. pt however today with inabilty to maintain EOB balance  Transfers Overall transfer level: Needs assistance Equipment used: Rolling walker (2 wheeled) Transfers: Sit to/from Stand Sit to Stand: Min assist         General transfer comment: v/c's to push up from bed, slow down  Ambulation/Gait Ambulation/Gait assistance: Mod assist;+2 physical assistance Ambulation Distance (Feet): 60 Feet Assistive device: Rolling walker (2 wheeled) Gait Pattern/deviations: Step-through pattern;Decreased stride length;Decreased stance time - right;Decreased step length - right;Decreased weight shift to left;Ataxic;Narrow base of  support Gait velocity: slow Gait velocity interpretation: Below normal speed for age/gender General Gait Details: pt with strong R lateral lean this date however better R LE gait kinematics. Pt easily distracted and con't to require v/c's to pick up R foot and clear it. pt with decreased activity tolerance this date compared to yesterday   Stairs            Wheelchair Mobility    Modified Rankin (Stroke Patients Only) Modified Rankin (Stroke Patients Only) Pre-Morbid Rankin Score: No symptoms Modified Rankin: Moderately severe disability     Balance Overall balance assessment: Needs assistance Sitting-balance support: No upper extremity supported Sitting balance-Leahy Scale: Poor Sitting balance - Comments: pt with posterior, lateral L lean   Standing balance support: Bilateral upper extremity supported Standing balance-Leahy Scale: Poor Standing balance comment: Requires +2 physical assistance.                             Cognition Arousal/Alertness: Awake/alert Behavior During Therapy: Flat affect Overall Cognitive Status: No family/caregiver present to determine baseline cognitive functioning                                 General Comments: Pt with improved command follow and engages in more appropriate conversation stating "I don't feel good today"       Exercises Other Exercises Other Exercises: worked on R LE slow and controlled movement in both sitting and standing to help manage ataxia    General Comments  Pertinent Vitals/Pain Pain Assessment: No/denies pain    Home Living                      Prior Function            PT Goals (current goals can now be found in the care plan section) Progress towards PT goals: Progressing toward goals    Frequency    Min 4X/week      PT Plan Current plan remains appropriate    Co-evaluation              AM-PAC PT "6 Clicks" Daily Activity  Outcome  Measure                   End of Session Equipment Utilized During Treatment: Gait belt Activity Tolerance: Patient limited by fatigue;Patient limited by lethargy Patient left: in bed;with call bell/phone within reach;with bed alarm set;with nursing/sitter in room Nurse Communication: Mobility status PT Visit Diagnosis: Unsteadiness on feet (R26.81);Difficulty in walking, not elsewhere classified (R26.2);Ataxic gait (R26.0) Hemiplegia - Right/Left: Right Hemiplegia - dominant/non-dominant: Dominant     Time: 8264-1583 PT Time Calculation (min) (ACUTE ONLY): 27 min  Charges:  $Gait Training: 8-22 mins $Therapeutic Exercise: 8-22 mins                    G Codes:            Kierstin January M Nolah Krenzer 07/25/2017, 2:52 PM

## 2017-07-25 NOTE — Care Management Important Message (Signed)
Important Message  Patient Details  Name: [name] MRN: [id number] Date of Birth: Oct 23, 1955   Medicare Important Message Given:  Yes    Carles Collet, RN 07/25/2017, 11:30 AM

## 2017-07-25 NOTE — Progress Notes (Signed)
Rehab admissions - I met with patient at the bedside.  He rents a room in a boarding house.  I spoke to the man who rents him a room.  I discussed inpatient rehab with patient and his landlord, but they want me to call patient's sister.  I have left a message with patient's sister to call me.  #[phone] 

## 2017-07-25 NOTE — Progress Notes (Signed)
  Speech Language Pathology Treatment: Cognitive-Linquistic  Patient Details Name: [name] MRN: [id number] DOB: 1955/10/19 Today's Date: 07/25/2017 Time: 0950-1000 SLP Time Calculation (min) (ACUTE ONLY): 10 min  Assessment / Plan / Recommendation Clinical Impression  Skilled treatment session focused on cognition goals. SLP received pt asleep in bed with sitter present. Pt resistive to attempts to arouse and speech was largely unintelligible also in part to loose fitting dentures. SLP provided additional support/attempts to arouse and participate, pt easily frustrated/agitated and increased his physical movement in bed to indicate refusal. ST session terminated. Pt left asleep in bed with sitter present.   HPI        SLP Plan  Continue with current plan of care       Recommendations                   Follow up Recommendations: Skilled Nursing facility;24 hour supervision/assistance SLP Visit Diagnosis: Cognitive communication deficit (K86.381) Plan: Continue with current plan of care       GO                Deshanta Lady 07/25/2017, 12:00 PM

## 2017-07-25 NOTE — Progress Notes (Addendum)
Initial Nutrition Assessment  DOCUMENTATION CODES:   Not applicable  INTERVENTION:    Continue Ensure Enlive po BID, each supplement provides 350 kcal and 20 grams of protein  NUTRITION DIAGNOSIS:   Increased nutrient needs related to chronic illness(COPD, hepatitis C, CAD) as evidenced by estimated needs  GOAL:   Patient will meet greater than or equal to 90% of their needs  MONITOR:   PO intake, Supplement acceptance, Labs, Skin, Weight trends  REASON FOR ASSESSMENT:   Malnutrition Screening Tool  62 y.o. Male presenting with AMS - stroke vs intoxication vs withdrawal. PMH is significant for Hx of stroke, Hx of small cell lung cancer, HTN, COPD, Anemia, HLD, Tobacco use disorder, and Depression.  RD unable to obtain nutrition hx. Pt confused. Sitter in room. Sitter reports pt did not eat his breakfast this am. Had 75% of lunch yesterday. Per readings below, pt with significant weight loss since December 2018.  On AWP Protocol. Medications include thiamine, folvite and MVI. Ensure Enlive nutrition supplements ordered BID. Labs reviewed. Glucose [id number].  NUTRITION - FOCUSED PHYSICAL EXAM:  Unable to complete at this time.  Diet Order:  Fall precautions Fall precautions Diet Heart Room service appropriate? Yes; Fluid consistency: Thin  EDUCATION NEEDS:   Not appropriate for education at this time  Skin:  Skin Assessment: Reviewed RN Assessment  Last BM:  2/24  Height:   Ht Readings from Last 1 Encounters:  07/22/17 5\' 7"  (1.702 m)   Weight:   Wt Readings from Last 1 Encounters:  07/22/17 147 lb 4.3 oz (66.8 kg)   Wt Readings from Last 10 Encounters:  07/22/17 147 lb 4.3 oz (66.8 kg)  06/28/17 151 lb 12.8 oz (68.9 kg)  06/21/17 153 lb (69.4 kg)  05/17/17 158 lb 3.2 oz (71.8 kg)  04/04/17 152 lb 9.6 oz (69.2 kg)  01/04/17 156 lb 6.4 oz (70.9 kg)  12/07/16 159 lb 9.6 oz (72.4 kg)  12/02/16 157 lb 6.5 oz (71.4 kg)  11/23/16 157 lb 6.4 oz (71.4  kg)  09/27/16 160 lb 12.8 oz (72.9 kg)   Ideal Body Weight:  67.2 kg  BMI:  Body mass index is 23.07 kg/m.  Estimated Nutritional Needs:   Kcal:  1800-2000  Protein:  90-105 gm  Fluid:  1.8-2.0 L  Arthur Holms, RD, LDN Pager #: [phone] After-Hours Pager #: [phone]

## 2017-07-25 NOTE — Progress Notes (Signed)
Family Medicine Teaching Service Daily Progress Note Intern Pager: [phone]  Patient name: [name] Medical record number: [id number] Date of birth: 08-26-1955 Age: 62 y.o. Gender: male  Primary Care Provider: Zenia Resides, MD Consultants: None Code Status: Full  Pt Overview and Major Events to Date:  2/24: Admit for AMS; stroke vs. Intoxication vs. Withdrawal; CT head neg  Assessment and Plan: [name] is a 62 y.o. male presenting with altered mental status. PMH is significant for Hx of stroke, Hx of small cell lung cancer, HTN, COPD, Anemia, HLD, Tobacco use disorder, and Depression.  Acute Infarct: Acute. 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 AMS resolved. Still has speech issues (but had some from previous stroke) and difficulty with gait. MRI shows small vessel infarcts, though multifocal nature does raise the possibility of emboli. Echo showed LVEF of 55-60% with mild mitral valve leaflet thickening. - Patient seen by PT and PMR and both are recommending CIR;  - Social work is helping with placement - Neurology following; 30 day even monitor before discharge - Cont ASA 81mg  and Plavix 75mg  x 3 months; then Plavix only - PT/OT - CIWA over past 24hrs 1 or 0  HTN: SBP 130-156 and DBP 77-86 over last 24hrs with addition of BP meds. Expect BP to continue to lower with regular administration. Also, patient has increased stress from not being able to smoke. Cont thiazide diuretic and an ACEI as they have been shown to prevent future stroke. - Cont Indapamide 1.25mg  daily - Cont Lisinopril 2.5mg  daily  Diffuse Rash, stable: Acute. Stable. No complaints this am (2/27). Eczema vs bed bugs. Patient was recently seen outpt for rash on upper and lower extremities.  - Contact precautions - Cont Kenalog cream - Claritin 10mg  daily - follow up outpatient  COPD: Chronic, stable, not currently in exacerbation. Home meds include Albuterol and Trelegy - Cont  home trelegy - duonebs q6 as needed  HLD: Chronic, stable  On Atorvastatin 80mg . Lipid panel showed LDL of 100. - Cont atorvastatin 80mg  daily  GERD: Chronic, stable. On omeprazole 40mg  daily at home. - Cont PPI, Protonix 40mg  daily  Depression: Chronic. On Buproprion 150mg  XL daily. - Restart Buproprion 150mg  XL daily  Neuropathy: Chronic, stable. No complaints since admission. On Gabapentin 300mg  TID at home. - Cont to hold Gabapentin 300mg  TID for sedation  Tobacco Use Disorder: Chronic.  - Nicotine patch 21mg  daily - Add Nicotine Lozenge   FEN/GI: NPO Prophylaxis: Lovenox  Disposition: Anticipate will need 1-2 further days of hospitalization to work up AMS  Subjective:  Patient is resting comfortably in bed. He is appropriate but wants to smoke and has a reasonable amount of agitation due to not being able to smoke. He denies chest pain, SOB, difficulty breathing, abdominal pain, or difficulty urinating. Patient is cooperative with exam.  Objective: Temp:  [97.5 F (36.4 C)-99.4 F (37.4 C)] 98.8 F (37.1 C) (02/27 0353) Pulse Rate:  [88-103] 88 (02/27 0353) Resp:  [16-20] 18 (02/27 0353) BP: (130-156)/(77-86) 156/82 (02/27 0353) SpO2:  [94 %-98 %] 96 % (02/27 0353) Physical Exam: Gen: Alert and Oriented x 3, NAD HEENT: Normocephalic, atraumatic, PERRLA, EOMI CV: RRR, no murmurs, normal S1, S2 split,  Resp: CTAB, no wheezing, rales, or rhonchi, comfortable work of breathing Abd: non-distended, non-tender, soft, +bs in all four quadrants MSK: FROM in all four extremities Ext: no clubbing, cyanosis, or edema Skin: warm, dry, intact, no rashes  Laboratory: Recent Labs  Lab 07/22/17 [phone]  07/23/17 0726 07/24/17 0537  WBC 6.8 6.6 6.7  HGB 15.5 15.0 14.2  HCT 45.9 44.0 41.8  PLT 209 228 199   Recent Labs  Lab 07/21/17 2031  07/22/17 0837 07/23/17 0726 07/24/17 0537  NA 142   < > 135 140 140  K 4.3   < > 3.7 3.7 3.9  CL 105   < > 100* 105 109  CO2  26  --  23 26 21*  BUN 19   < > 14 18 23*  CREATININE 0.91   < > 0.79 1.01 0.85  CALCIUM 9.7  --  9.1 9.1 9.1  PROT 8.1  --   --   --   --   BILITOT 0.9  --   --   --   --   ALKPHOS 92  --   --   --   --   ALT 20  --   --   --   --   AST 16  --   --   --   --   GLUCOSE 126*   < > 107* 97 109*   < > = values in this interval not displayed.   Significant labs this admission: Lipid Panel     Component Value Date/Time   CHOL 158 07/22/2017 0559   TRIG 95 07/22/2017 0559   HDL 39 (L) 07/22/2017 0559   CHOLHDL 4.1 07/22/2017 0559   VLDL 19 07/22/2017 0559   LDLCALC 100 (H) 07/22/2017 0559   TSH 1.464 Ethanol <10 UDS neg UA 5 ketones HbA1c pending   Imaging/Diagnostic Tests: Dg Chest 2 View 07/21/2017  IMPRESSION: No active cardiopulmonary disease.    Ct Head Wo Contrast 07/21/2017 CT and MR FINDINGS: Brain: No evidence of acute infarction, hemorrhage, hydrocephalus, extra-axial collection or mass lesion/mass effect. Atrophy, chronic small-vessel white matter ischemic changes and remote LEFT thalamic and LEFT caudate infarcts noted.  Vascular: Atherosclerotic calcifications identified.  Skull: No acute abnormality  Sinuses/Orbits: No acute abnormality  IMPRESSION:  1. No evidence of acute intracranial abnormality  2. Atrophy, chronic small-vessel white matter ischemic changes and remote LEFT thalamic and LEFT caudate infarcts.   MRI Brain IMPRESSION: 1. Limited motion degraded examination, patient could not complete examination. 2. Acute LEFT caudate to temporal stem nonhemorrhagic infarct. Acute LEFT pontine lacunar infarct with possible microhemorrhage. 3. Old RIGHT cerebellar, LEFT basal ganglia and LEFT thalamus infarcts. 4. Moderate parenchymal brain volume loss, advanced for age. 5. Moderate chronic small vessel ischemic disease.  CTA IMPRESSION: CT HEAD:  1. Evolving acute nonhemorrhagic LEFT basal ganglia to temporal stem infarct. 2. Old RIGHT cerebellar,  pontine, LEFT basal ganglia and LEFT thalamus infarcts. 3. Moderate parenchymal brain volume loss, advanced for age. 4. Moderate chronic small vessel ischemic disease.  CTA NECK:  1. No hemodynamically significant stenosis ICA. 2. Moderate stenosis proximal LEFT subclavian artery.  CTA HEAD:  1. No emergent large vessel occlusion. 2. Atherosclerosis resulting in moderate to severe stenoses bilateral posterior cerebral arteries. 3. Moderate stenosis RIGHT M2 segment.  2/25 - ECHO - Left ventricle: The cavity size was normal. Systolic function was   normal. The estimated ejection fraction was in the range of 55%   to 60%. - Mitral valve: Calcified annulus. Mildly thickened leaflets .  Nuala Alpha, DO 07/25/2017, 7:09 AM PGY-1, [facility] Intern pager: [phone], text pages welcome

## 2017-07-26 ENCOUNTER — Encounter (HOSPITAL_COMMUNITY): Payer: Self-pay | Admitting: Nurse Practitioner

## 2017-07-26 ENCOUNTER — Inpatient Hospital Stay (HOSPITAL_COMMUNITY)
Admission: RE | Admit: 2017-07-26 | Discharge: 2017-08-14 | DRG: 057 | Disposition: A | Discharge status: Skilled Nursing Facility | Payer: Medicare HMO | Source: Intra-hospital | Attending: Physical Medicine & Rehabilitation | Admitting: Physical Medicine & Rehabilitation

## 2017-07-26 DIAGNOSIS — K219 Gastro-esophageal reflux disease without esophagitis: Secondary | ICD-10-CM | POA: Diagnosis not present

## 2017-07-26 DIAGNOSIS — R109 Unspecified abdominal pain: Secondary | ICD-10-CM

## 2017-07-26 DIAGNOSIS — R4189 Other symptoms and signs involving cognitive functions and awareness: Secondary | ICD-10-CM | POA: Diagnosis present

## 2017-07-26 DIAGNOSIS — Z7982 Long term (current) use of aspirin: Secondary | ICD-10-CM

## 2017-07-26 DIAGNOSIS — I69351 Hemiplegia and hemiparesis following cerebral infarction affecting right dominant side: Principal | ICD-10-CM

## 2017-07-26 DIAGNOSIS — R269 Unspecified abnormalities of gait and mobility: Secondary | ICD-10-CM

## 2017-07-26 DIAGNOSIS — N179 Acute kidney failure, unspecified: Secondary | ICD-10-CM

## 2017-07-26 DIAGNOSIS — Z85118 Personal history of other malignant neoplasm of bronchus and lung: Secondary | ICD-10-CM

## 2017-07-26 DIAGNOSIS — Z7951 Long term (current) use of inhaled steroids: Secondary | ICD-10-CM | POA: Diagnosis not present

## 2017-07-26 DIAGNOSIS — Z8249 Family history of ischemic heart disease and other diseases of the circulatory system: Secondary | ICD-10-CM

## 2017-07-26 DIAGNOSIS — R7303 Prediabetes: Secondary | ICD-10-CM | POA: Diagnosis not present

## 2017-07-26 DIAGNOSIS — F329 Major depressive disorder, single episode, unspecified: Secondary | ICD-10-CM

## 2017-07-26 DIAGNOSIS — Z789 Other specified health status: Secondary | ICD-10-CM

## 2017-07-26 DIAGNOSIS — J449 Chronic obstructive pulmonary disease, unspecified: Secondary | ICD-10-CM | POA: Diagnosis present

## 2017-07-26 DIAGNOSIS — B888 Other specified infestations: Secondary | ICD-10-CM | POA: Diagnosis present

## 2017-07-26 DIAGNOSIS — I69393 Ataxia following cerebral infarction: Secondary | ICD-10-CM

## 2017-07-26 DIAGNOSIS — I69398 Other sequelae of cerebral infarction: Secondary | ICD-10-CM

## 2017-07-26 DIAGNOSIS — R21 Rash and other nonspecific skin eruption: Secondary | ICD-10-CM | POA: Diagnosis not present

## 2017-07-26 DIAGNOSIS — H9191 Unspecified hearing loss, right ear: Secondary | ICD-10-CM | POA: Diagnosis not present

## 2017-07-26 DIAGNOSIS — G8111 Spastic hemiplegia affecting right dominant side: Secondary | ICD-10-CM | POA: Diagnosis not present

## 2017-07-26 DIAGNOSIS — I2510 Atherosclerotic heart disease of native coronary artery without angina pectoris: Secondary | ICD-10-CM | POA: Diagnosis present

## 2017-07-26 DIAGNOSIS — E871 Hypo-osmolality and hyponatremia: Secondary | ICD-10-CM

## 2017-07-26 DIAGNOSIS — R1084 Generalized abdominal pain: Secondary | ICD-10-CM | POA: Diagnosis not present

## 2017-07-26 DIAGNOSIS — F17210 Nicotine dependence, cigarettes, uncomplicated: Secondary | ICD-10-CM | POA: Diagnosis present

## 2017-07-26 DIAGNOSIS — Z79899 Other long term (current) drug therapy: Secondary | ICD-10-CM | POA: Diagnosis not present

## 2017-07-26 DIAGNOSIS — G464 Cerebellar stroke syndrome: Secondary | ICD-10-CM | POA: Diagnosis not present

## 2017-07-26 DIAGNOSIS — I63523 Cerebral infarction due to unspecified occlusion or stenosis of bilateral anterior cerebral arteries: Secondary | ICD-10-CM | POA: Diagnosis not present

## 2017-07-26 DIAGNOSIS — I70202 Unspecified atherosclerosis of native arteries of extremities, left leg: Secondary | ICD-10-CM | POA: Diagnosis present

## 2017-07-26 DIAGNOSIS — K5900 Constipation, unspecified: Secondary | ICD-10-CM

## 2017-07-26 DIAGNOSIS — I6389 Other cerebral infarction: Secondary | ICD-10-CM

## 2017-07-26 DIAGNOSIS — Z9221 Personal history of antineoplastic chemotherapy: Secondary | ICD-10-CM

## 2017-07-26 DIAGNOSIS — I639 Cerebral infarction, unspecified: Secondary | ICD-10-CM | POA: Diagnosis present

## 2017-07-26 DIAGNOSIS — B1920 Unspecified viral hepatitis C without hepatic coma: Secondary | ICD-10-CM | POA: Diagnosis present

## 2017-07-26 DIAGNOSIS — I10 Essential (primary) hypertension: Secondary | ICD-10-CM | POA: Diagnosis not present

## 2017-07-26 DIAGNOSIS — R4587 Impulsiveness: Secondary | ICD-10-CM | POA: Diagnosis present

## 2017-07-26 DIAGNOSIS — K5901 Slow transit constipation: Secondary | ICD-10-CM | POA: Diagnosis not present

## 2017-07-26 DIAGNOSIS — B1710 Acute hepatitis C without hepatic coma: Secondary | ICD-10-CM | POA: Diagnosis not present

## 2017-07-26 DIAGNOSIS — M6281 Muscle weakness (generalized): Secondary | ICD-10-CM | POA: Diagnosis not present

## 2017-07-26 DIAGNOSIS — R278 Other lack of coordination: Secondary | ICD-10-CM | POA: Diagnosis not present

## 2017-07-26 DIAGNOSIS — F32A Depression, unspecified: Secondary | ICD-10-CM

## 2017-07-26 DIAGNOSIS — G8190 Hemiplegia, unspecified affecting unspecified side: Secondary | ICD-10-CM | POA: Diagnosis not present

## 2017-07-26 DIAGNOSIS — Z923 Personal history of irradiation: Secondary | ICD-10-CM

## 2017-07-26 DIAGNOSIS — G9009 Other idiopathic peripheral autonomic neuropathy: Secondary | ICD-10-CM | POA: Diagnosis not present

## 2017-07-26 DIAGNOSIS — F339 Major depressive disorder, recurrent, unspecified: Secondary | ICD-10-CM | POA: Diagnosis not present

## 2017-07-26 DIAGNOSIS — E119 Type 2 diabetes mellitus without complications: Secondary | ICD-10-CM | POA: Diagnosis not present

## 2017-07-26 DIAGNOSIS — Z9181 History of falling: Secondary | ICD-10-CM | POA: Diagnosis not present

## 2017-07-26 MED ORDER — PROCHLORPERAZINE EDISYLATE 5 MG/ML IJ SOLN: 5.0000 mg | Freq: Four times a day (QID) | INTRAMUSCULAR | Status: DC | PRN

## 2017-07-26 MED ORDER — ATORVASTATIN CALCIUM 80 MG PO TABS: 80.0000 mg | ORAL_TABLET | Freq: Every day | ORAL | 0 refills | Status: DC

## 2017-07-26 MED ORDER — UMECLIDINIUM BROMIDE 62.5 MCG/INH IN AEPB
1.0000 | INHALATION_SPRAY | Freq: Every day | RESPIRATORY_TRACT | Status: DC
  Administered 2017-07-27 – 2017-08-11 (×13): 1 via RESPIRATORY_TRACT
  Filled 2017-07-26 (×2): qty 7

## 2017-07-26 MED ORDER — ASPIRIN EC 81 MG PO TBEC
81.0000 mg | DELAYED_RELEASE_TABLET | Freq: Every day | ORAL | Status: DC
  Administered 2017-07-27 – 2017-08-14 (×19): 81 mg via ORAL
  Filled 2017-07-26 (×19): qty 1

## 2017-07-26 MED ORDER — TRIAMCINOLONE 0.1 % CREAM:EUCERIN CREAM 1:1
TOPICAL_CREAM | Freq: Two times a day (BID) | CUTANEOUS | Status: DC
  Administered 2017-07-26 – 2017-08-12 (×26): via TOPICAL
  Filled 2017-07-26 (×2): qty 1

## 2017-07-26 MED ORDER — POLYETHYLENE GLYCOL 3350 17 G PO PACK
17.0000 g | PACK | Freq: Two times a day (BID) | ORAL | Status: DC
  Administered 2017-07-26 – 2017-08-13 (×31): 17 g via ORAL
  Filled 2017-07-26 (×36): qty 1

## 2017-07-26 MED ORDER — CLOPIDOGREL BISULFATE 75 MG PO TABS: 75.0000 mg | ORAL_TABLET | Freq: Every day | ORAL | 0 refills | Status: DC

## 2017-07-26 MED ORDER — BISACODYL 10 MG RE SUPP
10.0000 mg | Freq: Every day | RECTAL | Status: DC | PRN
  Administered 2017-07-28 – 2017-08-07 (×4): 10 mg via RECTAL
  Filled 2017-07-26 (×4): qty 1

## 2017-07-26 MED ORDER — POLYETHYLENE GLYCOL 3350 17 G PO PACK
17.0000 g | PACK | Freq: Every day | ORAL | Status: DC | PRN
  Administered 2017-07-31: 17 g via ORAL
  Filled 2017-07-26: qty 1

## 2017-07-26 MED ORDER — FLUTICASONE FUROATE-VILANTEROL 200-25 MCG/INH IN AEPB
1.0000 | INHALATION_SPRAY | Freq: Every day | RESPIRATORY_TRACT | Status: DC
  Administered 2017-07-27 – 2017-08-11 (×13): 1 via RESPIRATORY_TRACT
  Filled 2017-07-26: qty 28

## 2017-07-26 MED ORDER — BUPROPION HCL ER (XL) 150 MG PO TB24
150.0000 mg | ORAL_TABLET | Freq: Every day | ORAL | Status: DC
  Administered 2017-07-27 – 2017-08-14 (×19): 150 mg via ORAL
  Filled 2017-07-26 (×19): qty 1

## 2017-07-26 MED ORDER — NICOTINE POLACRILEX 4 MG MT LOZG: 4.0000 mg | LOZENGE | OROMUCOSAL | 0 refills | Status: DC | PRN

## 2017-07-26 MED ORDER — POLYETHYLENE GLYCOL 3350 17 G PO PACK
17.0000 g | PACK | Freq: Every day | ORAL | Status: DC
  Administered 2017-07-26: 17 g via ORAL
  Filled 2017-07-26: qty 1

## 2017-07-26 MED ORDER — ENOXAPARIN SODIUM 40 MG/0.4ML ~~LOC~~ SOLN
40.0000 mg | SUBCUTANEOUS | Status: DC
  Administered 2017-07-26 – 2017-08-14 (×19): 40 mg via SUBCUTANEOUS
  Filled 2017-07-26 (×20): qty 0.4

## 2017-07-26 MED ORDER — ADULT MULTIVITAMIN W/MINERALS CH
1.0000 | ORAL_TABLET | Freq: Every day | ORAL | Status: DC
  Administered 2017-07-27 – 2017-08-14 (×19): 1 via ORAL
  Filled 2017-07-26 (×19): qty 1

## 2017-07-26 MED ORDER — PROCHLORPERAZINE MALEATE 5 MG PO TABS
5.0000 mg | ORAL_TABLET | Freq: Four times a day (QID) | ORAL | Status: DC | PRN
  Administered 2017-08-01 – 2017-08-05 (×2): 10 mg via ORAL
  Filled 2017-07-26 (×3): qty 2

## 2017-07-26 MED ORDER — LISINOPRIL 2.5 MG PO TABS: 2.5000 mg | ORAL_TABLET | Freq: Every day | ORAL | 0 refills | Status: DC

## 2017-07-26 MED ORDER — SENNOSIDES-DOCUSATE SODIUM 8.6-50 MG PO TABS
1.0000 | ORAL_TABLET | Freq: Two times a day (BID) | ORAL | Status: DC
  Administered 2017-07-26: 1 via ORAL
  Filled 2017-07-26: qty 1

## 2017-07-26 MED ORDER — LISINOPRIL 5 MG PO TABS
2.5000 mg | ORAL_TABLET | Freq: Every day | ORAL | Status: DC
  Administered 2017-07-27 – 2017-08-14 (×19): 2.5 mg via ORAL
  Filled 2017-07-26 (×19): qty 1

## 2017-07-26 MED ORDER — UMECLIDINIUM BROMIDE 62.5 MCG/INH IN AEPB: 1.0000 | INHALATION_SPRAY | Freq: Every day | RESPIRATORY_TRACT | 0 refills | Status: DC

## 2017-07-26 MED ORDER — NICOTINE POLACRILEX 4 MG MT LOZG
4.0000 mg | LOZENGE | OROMUCOSAL | Status: DC | PRN
  Administered 2017-07-27 – 2017-08-09 (×3): 4 mg via ORAL
  Filled 2017-07-26 (×5): qty 1

## 2017-07-26 MED ORDER — FLEET ENEMA 7-19 GM/118ML RE ENEM: 1.0000 | ENEMA | Freq: Once | RECTAL | Status: DC | PRN

## 2017-07-26 MED ORDER — ACETAMINOPHEN 325 MG PO TABS: 325.0000 mg | ORAL_TABLET | ORAL | Status: DC | PRN

## 2017-07-26 MED ORDER — TRAZODONE HCL 50 MG PO TABS
25.0000 mg | ORAL_TABLET | Freq: Every evening | ORAL | Status: DC | PRN
  Administered 2017-08-05 – 2017-08-13 (×6): 50 mg via ORAL
  Filled 2017-07-26 (×6): qty 1

## 2017-07-26 MED ORDER — PROCHLORPERAZINE 25 MG RE SUPP: 12.5000 mg | Freq: Four times a day (QID) | RECTAL | Status: DC | PRN

## 2017-07-26 MED ORDER — CAMPHOR-MENTHOL 0.5-0.5 % EX LOTN
TOPICAL_LOTION | Freq: Three times a day (TID) | CUTANEOUS | Status: DC
  Administered 2017-07-26 – 2017-08-12 (×47): via TOPICAL
  Filled 2017-07-26: qty 222

## 2017-07-26 MED ORDER — IPRATROPIUM-ALBUTEROL 0.5-2.5 (3) MG/3ML IN SOLN: 3.0000 mL | Freq: Four times a day (QID) | RESPIRATORY_TRACT | Status: DC | PRN

## 2017-07-26 MED ORDER — VITAMIN B-1 100 MG PO TABS
100.0000 mg | ORAL_TABLET | Freq: Every day | ORAL | Status: DC
  Administered 2017-07-27 – 2017-08-14 (×19): 100 mg via ORAL
  Filled 2017-07-26 (×19): qty 1

## 2017-07-26 MED ORDER — THIAMINE HCL 100 MG/ML IJ SOLN
100.0000 mg | Freq: Every day | INTRAMUSCULAR | Status: DC
  Filled 2017-07-26: qty 1
  Filled 2017-07-26: qty 2
  Filled 2017-07-26 (×3): qty 1
  Filled 2017-07-26 (×2): qty 2
  Filled 2017-07-26: qty 1
  Filled 2017-07-26: qty 2

## 2017-07-26 MED ORDER — FOLIC ACID 1 MG PO TABS
1.0000 mg | ORAL_TABLET | Freq: Every day | ORAL | Status: DC
Start: 2017-07-27 — End: 2017-08-14
  Administered 2017-07-27 – 2017-08-14 (×19): 1 mg via ORAL
  Filled 2017-07-26 (×19): qty 1

## 2017-07-26 MED ORDER — GUAIFENESIN-DM 100-10 MG/5ML PO SYRP: 5.0000 mL | ORAL_SOLUTION | Freq: Four times a day (QID) | ORAL | Status: DC | PRN

## 2017-07-26 MED ORDER — INDAPAMIDE 1.25 MG PO TABS: 1.2500 mg | ORAL_TABLET | Freq: Every day | ORAL | 0 refills | Status: DC

## 2017-07-26 MED ORDER — ALUM & MAG HYDROXIDE-SIMETH 200-200-20 MG/5ML PO SUSP
30.0000 mL | ORAL | Status: DC | PRN
  Administered 2017-08-01 – 2017-08-02 (×2): 30 mL via ORAL
  Filled 2017-07-26 (×3): qty 30

## 2017-07-26 MED ORDER — INDAPAMIDE 1.25 MG PO TABS
1.2500 mg | ORAL_TABLET | Freq: Every day | ORAL | Status: DC
  Administered 2017-07-27 – 2017-08-14 (×19): 1.25 mg via ORAL
  Filled 2017-07-26 (×19): qty 1

## 2017-07-26 MED ORDER — CLOPIDOGREL BISULFATE 75 MG PO TABS
75.0000 mg | ORAL_TABLET | Freq: Every day | ORAL | Status: DC
  Administered 2017-07-27 – 2017-08-14 (×19): 75 mg via ORAL
  Filled 2017-07-26 (×19): qty 1

## 2017-07-26 MED ORDER — NICOTINE 21 MG/24HR TD PT24: 21.0000 mg | MEDICATED_PATCH | Freq: Every day | TRANSDERMAL | 0 refills | Status: DC

## 2017-07-26 MED ORDER — LORATADINE 10 MG PO TABS
10.0000 mg | ORAL_TABLET | Freq: Every day | ORAL | Status: DC
  Administered 2017-07-27 – 2017-08-14 (×19): 10 mg via ORAL
  Filled 2017-07-26 (×19): qty 1

## 2017-07-26 MED ORDER — SENNA 8.6 MG PO TABS
1.0000 | ORAL_TABLET | Freq: Every day | ORAL | Status: DC
  Administered 2017-07-26: 8.6 mg via ORAL
  Filled 2017-07-26: qty 1

## 2017-07-26 MED ORDER — ENSURE ENLIVE PO LIQD
237.0000 mL | Freq: Two times a day (BID) | ORAL | Status: DC
  Administered 2017-07-27 – 2017-08-11 (×19): 237 mL via ORAL

## 2017-07-26 MED ORDER — ATORVASTATIN CALCIUM 80 MG PO TABS
80.0000 mg | ORAL_TABLET | Freq: Every day | ORAL | Status: DC
  Administered 2017-07-27 – 2017-08-14 (×19): 80 mg via ORAL
  Filled 2017-07-26 (×19): qty 1

## 2017-07-26 MED ORDER — NICOTINE 21 MG/24HR TD PT24
21.0000 mg | MEDICATED_PATCH | Freq: Every day | TRANSDERMAL | Status: DC
  Administered 2017-07-27 – 2017-08-14 (×19): 21 mg via TRANSDERMAL
  Filled 2017-07-26 (×19): qty 1

## 2017-07-26 MED ORDER — PANTOPRAZOLE SODIUM 40 MG PO TBEC
40.0000 mg | DELAYED_RELEASE_TABLET | Freq: Every day | ORAL | Status: DC
  Administered 2017-07-27 – 2017-08-14 (×19): 40 mg via ORAL
  Filled 2017-07-26 (×19): qty 1

## 2017-07-26 MED ORDER — DIPHENHYDRAMINE HCL 12.5 MG/5ML PO ELIX: 12.5000 mg | ORAL_SOLUTION | Freq: Four times a day (QID) | ORAL | Status: DC | PRN

## 2017-07-26 NOTE — Progress Notes (Signed)
CSW completed paperwork for rescinding IVC and faxed to clerk of court. Patient no longer under IVC at this time.  CSW alerted that patient will be admitting to CIR, will not need SNF placement. CSW signing off.  Laveda Abbe, [place] Clinical Social Worker [phone]

## 2017-07-26 NOTE — Discharge Instructions (Signed)
You were admitted due to a new left sided stroke. You were evaluated and determined that you needed inpatient rehabilitation. Several medication changes were made. Please be sure to take these as prescribed as they will reduce your risk of stroke.

## 2017-07-26 NOTE — Progress Notes (Signed)
CSW following for discharge plan. Per MD notes, patient is able to be cleared from IVC but it was never formally rescinded. CSW paged MD to see if they would like that to be rescinded, started on paperwork. Patient will need IVC rescinded before sitter can be discontinued; sitter will need to be DC'd for 24 hours before the patient can DC to SNF.  CSW will continue to follow.  Laveda Abbe, [place] Clinical Social Worker [phone]

## 2017-07-26 NOTE — Progress Notes (Signed)
Physical Therapy Treatment Patient Details Name: [name] MRN: [id number] DOB: September 08, 1955 Today's Date: 07/26/2017    History of Present Illness 62 y.o. male presenting with altered mental status. PMH is significant for Hx of stroke, Hx of small cell lung cancer, HTN, COPD, Anemia, HLD, Tobacco use disorder, and Depression.  Now with new ataxia and right UE weakness and progressing slurring.    PT Comments    Patient progressing well towards PT goals. Not happy about breakfast arriving late. Balance seems mildly improved today. Tolerated gait training with HHA instead of RW. Pt able to follow 2 step commands with increased time and repetition. Continues to have difficulty with sequencing/placement of RLE during gait, impulsivity and right knee instability. Encouraged pt to use RUE for all transfers/functional tasks. Anxious to get to CIR. Will follow.   Follow Up Recommendations  CIR     Equipment Recommendations  None recommended by PT    Recommendations for Other Services       Precautions / Restrictions Precautions Precautions: Fall Restrictions Weight Bearing Restrictions: No    Mobility  Bed Mobility Overal bed mobility: Needs Assistance Bed Mobility: Supine to Sit     Supine to sit: Min guard;HOB elevated     General bed mobility comments: Able to get to EOB without assist, cues to use RUE to help.   Transfers Overall transfer level: Needs assistance Equipment used: None Transfers: Sit to/from Stand Sit to Stand: Min assist         General transfer comment: Assist to stand from EOB x1, from chair x3. Cues for WB through RUE to power up to standing.   Ambulation/Gait Ambulation/Gait assistance: Mod assist;+2 physical assistance Ambulation Distance (Feet): 50 Feet(+40') Assistive device: 2 person hand held assist Gait Pattern/deviations: Step-through pattern;Decreased stride length;Decreased stance time - right;Decreased weight shift to  left;Ataxic;Narrow base of support;Step-to pattern;Decreased step length - left   Gait velocity interpretation: Below normal speed for age/gender General Gait Details: Cues to increase step length LLE, for RLE placement. Less lateral lean today. 1 seated rest break. Needs repetition of cues. Right knee ext thrust noted at times esp when fatigued.    Stairs            Wheelchair Mobility    Modified Rankin (Stroke Patients Only) Modified Rankin (Stroke Patients Only) Pre-Morbid Rankin Score: No symptoms Modified Rankin: Moderately severe disability     Balance Overall balance assessment: Needs assistance Sitting-balance support: Feet supported;No upper extremity supported Sitting balance-Leahy Scale: Poor Sitting balance - Comments: Able to sit EOB without assist for static sitting but require sclose Min guard for dynamic tasks.    Standing balance support: During functional activity Standing balance-Leahy Scale: Poor Standing balance comment: Requires assist for dynamic standing/gait.                            Cognition Arousal/Alertness: Awake/alert Behavior During Therapy: WFL for tasks assessed/performed;Impulsive Overall Cognitive Status: No family/caregiver present to determine baseline cognitive functioning Area of Impairment: Attention;Memory                   Current Attention Level: Selective Memory: Decreased short-term memory Following Commands: Follows multi-step commands inconsistently;Follows one step commands consistently Safety/Judgement: Decreased awareness of safety Awareness: Emergent   General Comments: Follows 2 step commands today with increased time and repetition. Gets rights/lefts mixed up. Impulsive initially. Oriented today. Angry that he did not get his breakfast til lunch time.  Exercises      General Comments General comments (skin integrity, edema, etc.): Friend who is a PTA was present.      Pertinent  Vitals/Pain Pain Assessment: No/denies pain    Home Living                      Prior Function            PT Goals (current goals can now be found in the care plan section) Progress towards PT goals: Progressing toward goals    Frequency    Min 4X/week      PT Plan Current plan remains appropriate    Co-evaluation              AM-PAC PT "6 Clicks" Daily Activity  Outcome Measure  Difficulty turning over in bed (including adjusting bedclothes, sheets and blankets)?: None Difficulty moving from lying on back to sitting on the side of the bed? : None Difficulty sitting down on and standing up from a chair with arms (e.g., wheelchair, bedside commode, etc,.)?: Unable Help needed moving to and from a bed to chair (including a wheelchair)?: A Lot Help needed walking in hospital room?: A Lot Help needed climbing 3-5 steps with a railing? : A Lot 6 Click Score: 15    End of Session Equipment Utilized During Treatment: Gait belt Activity Tolerance: Patient tolerated treatment well Patient left: in bed;with call bell/phone within reach;with family/visitor present;with nursing/sitter in room Nurse Communication: Mobility status PT Visit Diagnosis: Unsteadiness on feet (R26.81);Difficulty in walking, not elsewhere classified (R26.2);Ataxic gait (R26.0) Hemiplegia - Right/Left: Right Hemiplegia - dominant/non-dominant: Dominant     Time: 4825-0037 PT Time Calculation (min) (ACUTE ONLY): 24 min  Charges:  $Gait Training: 8-22 mins $Neuromuscular Re-education: 8-22 mins                    G Codes:       Wray Kearns, PT, DPT [phone]     Marguarite Arbour A Yicel Shannon 07/26/2017, 12:06 PM

## 2017-07-26 NOTE — Progress Notes (Signed)
Rehab admissions - I have spoken with patient's sister and his friend, Jenny Reichmann.  Both are in agreement to inpatient rehab admission.  I have approval for admission from [facility] carrier.  Bed available and will admit to acute inpatient rehab today.  Call me for questions.  #[phone]

## 2017-07-26 NOTE — Progress Notes (Signed)
Family Medicine Teaching Service Daily Progress Note Intern Pager: [phone]  Patient name: [name] Medical record number: [id number] Date of birth: 30-Dec-1955 Age: 62 y.o. Gender: male  Primary Care Provider: Zenia Resides, MD Consultants: None Code Status: Full  Pt Overview and Major Events to Date:  2/24: Admit for AMS; stroke vs. Intoxication vs. Withdrawal; CT head neg  Assessment and Plan: [name] is a 62 y.o. male presenting with altered mental status. PMH is significant for Hx of stroke, Hx of small cell lung cancer, HTN, COPD, Anemia, HLD, Tobacco use disorder, and Depression.  Acute Infarct: Acute. Stable. 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 AMS resolved. Still has speech issues (but had some from previous stroke) and difficulty with gait. MRI shows small vessel infarcts, though multifocal nature does raise the possibility of emboli. Echo showed LVEF of 55-60% with mild mitral valve leaflet thickening. - Patient seen by PT and PMR and both are recommending CIR;  - Social work is helping with placement - Neurology following; 30 day even monitor before discharge - Cont ASA 81mg  and Plavix 75mg  x 3 months; then Plavix only - PT/OT - CIWA over past 48hrs remain 1 or 0  HTN: SBP 115-150 and DBP 76-92 over last 24hrs with addition of BP meds. BP overall is down trending toward becoming more normotensive. Also, patient has increased stress from not being able to smoke causing transient rise in BP. Cont thiazide diuretic and an ACEI as they have been shown to prevent future stroke. - Cont Indapamide 1.25mg  daily - Cont Lisinopril 2.5mg  daily  Diffuse Rash, stable: Acute. Stable. No complaints this am (2/28). Eczema vs bed bugs. Could be a combination.  At this point, patient has been away from any previous exposure and has not been wearing his own clothes for several days. - D/c contact precautions - Cont Kenalog cream - Claritin 10mg  daily -  follow up outpatient  COPD: Chronic, stable, not currently in exacerbation. Home meds include Albuterol and Trelegy - Cont home trelegy - duonebs q6 as needed  HLD: Chronic, stable  On Atorvastatin 80mg . Lipid panel showed LDL of 100. - Cont atorvastatin 80mg  daily  GERD: Chronic, stable. On omeprazole 40mg  daily at home. - Cont PPI, Protonix 40mg  daily  Depression: Chronic. On Buproprion 150mg  XL daily. - Restart Buproprion 150mg  XL daily  Neuropathy: Chronic, stable. No complaints since admission. On Gabapentin 300mg  TID at home. - Cont to hold Gabapentin 300mg  TID for sedation  Tobacco Use Disorder: Chronic.  - Nicotine patch 21mg  daily - Add Nicotine Lozenge   FEN/GI: NPO Prophylaxis: Lovenox  Disposition: Anticipate will need 1-2 further days of hospitalization to work up AMS  Subjective:  Patient is doing well and is alert to person and place this am. He has no complaints but is having difficulty with not being able to smoke. He denies chest pain, SOB, difficulty breathing, abdominal pain, nausea, vomiting. He is having itching on his legs.  Objective: Temp:  [97.7 F (36.5 C)-98.9 F (37.2 C)] 98.9 F (37.2 C) (02/28 0424) Pulse Rate:  [91-108] 92 (02/28 0424) Resp:  [18-26] 18 (02/28 0424) BP: (115-150)/(76-92) 123/79 (02/28 0424) SpO2:  [96 %-99 %] 96 % (02/28 0424) Physical Exam: Gen: Alert and Oriented x 3, NAD HEENT: Normocephalic, atraumatic, PERRLA, EOMI CV: RRR, no murmurs, normal S1, S2 split Resp: Mild wheezing bibasilar, no rales, or rhonchi, comfortable work of breathing Abd: non-distended, non-tender, soft, +bs in all four quadrants Ext: no clubbing,  cyanosis, or edema Skin: warm, dry, intact, diffuse erythematous, dry, rash on legs and arms  Laboratory: Recent Labs  Lab 07/22/17 0837 07/23/17 0726 07/24/17 0537  WBC 6.8 6.6 6.7  HGB 15.5 15.0 14.2  HCT 45.9 44.0 41.8  PLT 209 228 199   Recent Labs  Lab 07/21/17 2031   07/23/17 0726 07/24/17 0537 07/25/17 0856  NA 142   < > 140 140 137  K 4.3   < > 3.7 3.9 3.8  CL 105   < > 105 109 104  CO2 26   < > 26 21* 24  BUN 19   < > 18 23* 18  CREATININE 0.91   < > 1.01 0.85 0.87  CALCIUM 9.7   < > 9.1 9.1 9.3  PROT 8.1  --   --   --   --   BILITOT 0.9  --   --   --   --   ALKPHOS 92  --   --   --   --   ALT 20  --   --   --   --   AST 16  --   --   --   --   GLUCOSE 126*   < > 97 109* 116*   < > = values in this interval not displayed.   Significant labs this admission: Lipid Panel     Component Value Date/Time   CHOL 158 07/22/2017 0559   TRIG 95 07/22/2017 0559   HDL 39 (L) 07/22/2017 0559   CHOLHDL 4.1 07/22/2017 0559   VLDL 19 07/22/2017 0559   LDLCALC 100 (H) 07/22/2017 0559   TSH 1.464 Ethanol <10 UDS neg UA 5 ketones HbA1c pending   Imaging/Diagnostic Tests: Dg Chest 2 View 07/21/2017  IMPRESSION: No active cardiopulmonary disease.    Ct Head Wo Contrast 07/21/2017 CT and MR FINDINGS: Brain: No evidence of acute infarction, hemorrhage, hydrocephalus, extra-axial collection or mass lesion/mass effect. Atrophy, chronic small-vessel white matter ischemic changes and remote LEFT thalamic and LEFT caudate infarcts noted.  Vascular: Atherosclerotic calcifications identified.  Skull: No acute abnormality  Sinuses/Orbits: No acute abnormality  IMPRESSION:  1. No evidence of acute intracranial abnormality  2. Atrophy, chronic small-vessel white matter ischemic changes and remote LEFT thalamic and LEFT caudate infarcts.   MRI Brain IMPRESSION: 1. Limited motion degraded examination, patient could not complete examination. 2. Acute LEFT caudate to temporal stem nonhemorrhagic infarct. Acute LEFT pontine lacunar infarct with possible microhemorrhage. 3. Old RIGHT cerebellar, LEFT basal ganglia and LEFT thalamus infarcts. 4. Moderate parenchymal brain volume loss, advanced for age. 5. Moderate chronic small vessel ischemic  disease.  CTA IMPRESSION: CT HEAD:  1. Evolving acute nonhemorrhagic LEFT basal ganglia to temporal stem infarct. 2. Old RIGHT cerebellar, pontine, LEFT basal ganglia and LEFT thalamus infarcts. 3. Moderate parenchymal brain volume loss, advanced for age. 4. Moderate chronic small vessel ischemic disease.  CTA NECK:  1. No hemodynamically significant stenosis ICA. 2. Moderate stenosis proximal LEFT subclavian artery.  CTA HEAD:  1. No emergent large vessel occlusion. 2. Atherosclerosis resulting in moderate to severe stenoses bilateral posterior cerebral arteries. 3. Moderate stenosis RIGHT M2 segment.  2/25 - ECHO - Left ventricle: The cavity size was normal. Systolic function was   normal. The estimated ejection fraction was in the range of 55%   to 60%. - Mitral valve: Calcified annulus. Mildly thickened leaflets .  Nuala Alpha, DO 07/26/2017, 6:53 AM PGY-1, [place] Intern pager:  [phone], text pages welcome
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
## 2017-07-26 NOTE — Progress Notes (Signed)
Patient ID: Camron Monday, male   DOB: [birth year], 62 y.o.   MRN: [id number] Patient admitted to [phone] via wheelchair, escorted by nursing staff.  Patients sister came during admission.  Patient and sister both verbalized understanding of rehab process, including fall prevention policy.  Instructed patients sister to take patients belongings home since his boarding house is being treated for bed bugs currently.  Other valuables already sent with patients friend (per previous unit).  Telesitter ordered for patient safety.  Nursing staff assisted patient with shower and helped him to bed.  Bed alarm on with call bell within reach and sister at bedside.  Patient appears to be in no immediate distress at this time.  Brita Romp, RN

## 2017-07-26 NOTE — Care Management Note (Signed)
Case Management Note  Patient Details  Name: [name] MRN: [id number] Date of Birth: Feb 08, 1956  Subjective/Objective:                    Action/Plan: Pt discharging to CIR today. No further needs per CM.   Expected Discharge Date:  07/26/17               Expected Discharge Plan:  Skilled Nursing Facility  In-House Referral:  Clinical Social Work  Discharge planning Services     Post Acute Care Choice:    Choice offered to:     DME Arranged:    DME Agency:     HH Arranged:    [place] Agency:     Status of Service:  Completed, signed off  If discussed at [employer], dates discussed:    Additional Comments:  Pollie Friar, RN 07/26/2017, 12:47 PM

## 2017-07-26 NOTE — PMR Pre-admission (Signed)
PMR Admission Coordinator Pre-Admission Assessment  Patient: [name] is an 62 y.o., male MRN: [id number] DOB: Oct 21, 1955 Height: _0  (170.2 cm) Weight: 66.8 kg (147 lb 4.3 oz)             Insurance Information HMO: Yes  PPO:       PCP:       IPA:       80/20:       OTHER:   PRIMARY: [employer]      Policy#: [id number]      Subscriber:  Irma Newness  CM Name:  Lorretta Harp      Phone#: [phone]     Fax#: [phone] Pre-Cert#: [id number]      Employer: Disabled Benefits:  Phone #: [phone]     Name:  Benjamin Stain. Date: 12/27/16     Deduct: $183 (met $143.88      Out of Pocket Max: [phone] (met $143.88)      Life Max:  N/A CIR: $1860 per admission      SNF: $0 days 1-20; $172 days 21-100 Outpatient: medical necessity     Co-Pay: $40/visit Home Health: 100%      Co-Pay: none DME: 80%     Co-Pay: 20% Providers: in network  SECONDARY: Medicaid of [place]    Policy#: [id number]      Subscriber: Irma Newness CM Name:        Phone#:       Fax#:   Pre-Cert#:        Employer:  Disabled Benefits:  Phone #: [phone]     Name: Automated Eff. 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 UDS negative. CTA headreviewed, showing left basal ganglia infarct. Per report,evolving acute left basal ganglia to temporal stem infarct with old right cerebellar, pontine and left basal ganglia and thalamic stroke with moderate parenchymal brain volume loss, moderate stenosis right M2 segment and moderate stenosis proximal L-SA. 2 D echo showed EF of 55-60% with calcified mitral valve. 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  Dr. Erlinda Hong felt stroke to be due to small vessel disease v/s cardioembolic ---patient reported non-compliance with medication as well as ongoing tobacco use. To continue ASA/Plavix and 30 day event monitor recommended at discharge to rule out A fib. He continues to be oriented to self only with impulsivity, right sided weakness and ataxic gait. Has been requiring sitter for safety.  Mentation improving and CIR recommended due to functional deficits.    Total: 2=NIH  Past Medical History  Past Medical History:  Diagnosis Date  . CAD (coronary artery disease) 06/30/2015  . CVA (cerebral vascular accident) ([place])    x4  . GERD (gastroesophageal reflux disease)   . H/O: lung cancer right side  . Headache   . Hepatitis C infection   . History of blood transfusion 1981  . Hypertension 12/28/2010  . NECK PAIN 11/15/2007   Qualifier: Diagnosis of  By: Andria Frames MD, Gwyndolyn Saxon    . Pain of right lower leg 04/08/2015  . Peripheral arterial disease (HCC)    a. s/p PV angiogram on 07/19/15 with successful mid left SFA chronic total occlusion directional atherectomy followed by drug eluting balloon angioplasty using distal protection.  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 s/p chemo/xrt comp to 2006    Family History  family history includes Cancer in his brother and brother; Coronary artery  disease (age of onset: 33) in his brother; Dementia in his mother; Heart attack in his brother; Heart disease in his brother.  Prior Rehab/Hospitalizations: No previous rehab.  Has the patient had major surgery during 100 days prior to admission? No  Current Medications   Current Facility-Administered Medications:  .   stroke: mapping our early stages of recovery book, , Does not apply, Once, Greta Doom, MD .  acetaminophen (TYLENOL) tablet 650 mg, 650 mg, Oral, Q4H PRN, 650 mg at 07/23/17 1210 **OR** acetaminophen (TYLENOL) solution 650 mg, 650 mg, Per Tube, Q4H PRN **OR** acetaminophen (TYLENOL) suppository 650 mg, 650 mg, Rectal, Q4H PRN, Lockamy, Timothy, DO .  aspirin EC tablet 81 mg, 81 mg, Oral, Daily, Rosalin Hawking, MD, 81 mg at 07/26/17 1005 .  atorvastatin (LIPITOR) tablet 80 mg, 80 mg, Oral, Daily, Costello, Mary A, NP, 80 mg at 07/26/17 1005 .  buPROPion (WELLBUTRIN XL) 24 hr tablet 150 mg, 150 mg, Oral, Daily, Bonnita Hollow, MD, 150 mg at 07/26/17 1006 .  clopidogrel (PLAVIX) tablet 75 mg, 75 mg, Oral, Daily, Rosalin Hawking, MD, 75 mg at 07/26/17 1004 .  feeding supplement (ENSURE ENLIVE) (ENSURE ENLIVE) liquid 237 mL, 237 mL, Oral, BID BM, Chambliss, Marshall L, MD, 237 mL at 07/26/17 1002 .  fluticasone furoate-vilanterol (BREO ELLIPTA) 200-25 MCG/INH 1 puff, 1 puff, Inhalation, Daily, Chambliss, Jeb Levering, MD, 1 puff at 07/26/17 0820 .  folic acid (FOLVITE) tablet 1 mg, 1 mg, Oral, Daily, Everrett Coombe, MD, 1 mg at 07/26/17 1005 .  hydrALAZINE (APRESOLINE) injection 10 mg, 10 mg, Intravenous, Q4H PRN, Lockamy, Timothy, DO .  indapamide (LOZOL) tablet 1.25 mg, 1.25 mg, Oral, Daily, Abraham, Sherin, DO, 1.25 mg at 07/26/17 1006 .  ipratropium-albuterol (DUONEB) 0.5-2.5 (3) MG/3ML nebulizer solution 3 mL, 3 mL, Nebulization, Q6H PRN, Chambliss, Marshall L, MD .  lisinopril (PRINIVIL,ZESTRIL) tablet 2.5 mg, 2.5 mg, Oral, Daily, Abraham, Sherin, DO, 2.5 mg at 07/26/17  1004 .  loratadine (CLARITIN) tablet 10 mg, 10 mg, Oral, Daily, Lockamy, Timothy, DO, 10 mg at 07/26/17 1004 .  multivitamin with minerals tablet 1 tablet, 1 tablet, Oral, Daily, Everrett Coombe, MD, 1 tablet at 07/26/17 1004 .  nicotine (NICODERM CQ - dosed in mg/24 hours) patch 21 mg, 21 mg, Transdermal, Daily, Lockamy, Timothy, DO, 21 mg at 07/26/17 1008 .  nicotine polacrilex (COMMIT) lozenge 4 mg, 4  mg, Oral, Q2H PRN, Mayo, Pete Pelt, MD .  pantoprazole (PROTONIX) EC tablet 40 mg, 40 mg, Oral, Daily, Everrett Coombe, MD, 40 mg at 07/26/17 1005 .  polyethylene glycol (MIRALAX / GLYCOLAX) packet 17 g, 17 g, Oral, Daily, Bonnita Hollow, MD .  senna [facility]) tablet 8.6 mg, 1 tablet, Oral, Daily, Bonnita Hollow, MD .  thiamine (VITAMIN B-1) tablet 100 mg, 100 mg, Oral, Daily, 100 mg at 07/26/17 1005 **OR** thiamine (B-1) injection 100 mg, 100 mg, Intravenous, Daily, Everrett Coombe, MD .  triamcinolone cream (KENALOG) 0.5 % 1 application, 1 application, Topical, TID, Lockamy, Timothy, DO, 1 application at 26/71/24 1006 .  umeclidinium bromide (INCRUSE ELLIPTA) 62.5 MCG/INH 1 puff, 1 puff, Inhalation, Daily, Chambliss, Jeb Levering, MD, 1 puff at 07/26/17 0818  Patients Current Diet: Fall precautions Fall precautions Diet Heart Room service appropriate? Yes; Fluid consistency: Thin  Precautions / Restrictions Precautions Precautions: Fall Precaution Comments: pt high risk fall - no balance reactions Restrictions Weight Bearing Restrictions: No   Has the patient had 2 or more falls or a fall with injury in the past year?No  Prior Activity Level Community (5-7x/wk): Went out daily.  Was driving.  Home Assistive Devices / Equipment Home Assistive Devices/Equipment: Eyeglasses, Dentures (specify type)  Prior Device Use: Indicate devices/aids used by the patient prior to current illness, exacerbation or injury? None  Prior Functional Level Prior Function Comments: Pt reports independence  with ADL but unsure of his true PLOF.   Self Care: Did the patient need help bathing, dressing, using the toilet or eating?  Independent  Indoor Mobility: Did the patient need assistance with walking from room to room (with or without device)? Independent  Stairs: Did the patient need assistance with internal or external stairs (with or without device)? Independent  Functional Cognition: Did the patient need help planning regular tasks such as shopping or remembering to take medications? Independent  Current Functional Level Cognition  Arousal/Alertness: Lethargic(awakens with cues, needs consistent cues for maintaining ) Overall Cognitive Status: No family/caregiver present to determine baseline cognitive functioning Current Attention Level: Selective Orientation Level: Oriented to person, Disoriented to time, Oriented to place, Disoriented to situation Following Commands: Follows multi-step commands inconsistently, Follows one step commands consistently Safety/Judgement: Decreased awareness of safety General Comments: Follows 2 step commands today with increased time and repetition. Gets rights/lefts mixed up. Impulsive initially. Oriented today. Angry that he did not get his breakfast til lunch time. Attention: Focused Focused Attention: Impaired Memory: Impaired Memory Impairment: Decreased recall of new information Awareness: Impaired Problem Solving: Impaired Executive Function: Organizing, Sequencing Sequencing: Impaired Organizing: Impaired Behaviors: (hx of agitation, not evidenced with SLP this date) Safety/Judgment: Impaired    Extremity Assessment (includes Sensation/Coordination)  Upper Extremity Assessment: RUE deficits/detail RUE Deficits / Details: Poor proprioception noted with RUE movement during reach to target both with and without visual feedback.   Lower Extremity Assessment: Defer to PT evaluation RLE Deficits / Details: pts right leg is not as strong as  left - pt with full AROM but he doesnt automatically use this leg to scoot up in bed, cant hold it upright in hooklying. pt denies sensory changes in leg    ADLs  Overall ADL's : Needs assistance/impaired Eating/Feeding: Sitting, Minimal assistance Grooming: Sitting, Minimal assistance Upper Body Bathing: Sitting, Minimal assistance Lower Body Bathing: Sit to/from stand, Moderate assistance Upper Body Dressing : Sitting, Minimal assistance Lower Body Dressing: Sit to/from stand, Moderate assistance Lower Body Dressing Details (indicate cue type and  reason): Assist to maintain balance.  Toilet Transfer: +2 for physical assistance, Moderate assistance Toilet Transfer Details (indicate cue type and reason): Only able to take side steps at EOB today.  Toileting- Clothing Manipulation and Hygiene: Maximal assistance, Sit to/from stand General ADL Comments: Pt with limited tolerance for activity today reporting dizziness in standing position. He did require significant assistance to attend to tasks today. Only able to stand for approximately 30 seconds at a time.     Mobility  Overal bed mobility: Needs Assistance Bed Mobility: Supine to Sit Supine to sit: Min guard, HOB elevated Sit to supine: Min assist General bed mobility comments: Able to get to EOB without assist, cues to use RUE to help.     Transfers  Overall transfer level: Needs assistance Equipment used: None Transfers: Sit to/from Stand Sit to Stand: Min assist General transfer comment: Assist to stand from EOB x1, from chair x3. Cues for WB through RUE to power up to standing.     Ambulation / Gait / Stairs / Wheelchair Mobility  Ambulation/Gait Ambulation/Gait assistance: Mod assist, +2 physical assistance Ambulation Distance (Feet): 50 Feet(+40') Assistive device: 2 person hand held assist Gait Pattern/deviations: Step-through pattern, Decreased stride length, Decreased stance time - right, Decreased weight shift to left,  Ataxic, Narrow base of support, Step-to pattern, Decreased step length - left General Gait Details: Cues to increase step length LLE, for RLE placement. Less lateral lean today. 1 seated rest break. Needs repetition of cues. Right knee ext thrust noted at times esp when fatigued.  Gait velocity: slow Gait velocity interpretation: Below normal speed for age/gender    Posture / Balance Dynamic Sitting Balance Sitting balance - Comments: Able to sit EOB without assist for static sitting but require sclose Min guard for dynamic tasks.  Balance Overall balance assessment: Needs assistance Sitting-balance support: Feet supported, No upper extremity supported Sitting balance-Leahy Scale: Poor Sitting balance - Comments: Able to sit EOB without assist for static sitting but require sclose Min guard for dynamic tasks.  Standing balance support: During functional activity Standing balance-Leahy Scale: Poor Standing balance comment: Requires assist for dynamic standing/gait.    Special needs/care consideration BiPAP/CPAP No CPM No Continuous Drip IV No Dialysis No      Life Vest No Oxygen No Special Bed No Trach Size No Wound Vac (area) No      Skin No                          Bowel mgmt: Last BM 07/22/17 Bladder mgmt: External catheter Diabetic mgmt No Contact Precautions: Yes   Previous Home Environment Living Arrangements: Non-relatives/Friends  Lives With: Friend(s) Available Help at Discharge: (Unknown) Type of Home: House Home Care Services: No Additional Comments: pt said he lives alone in a house and walked around safely with no AD.  Unsure accuracy of this information.  Discharge Living Setting Plans for Discharge Living Setting: House, Lives with (comment)(Lives in a house with friend Jenny Reichmann and his wife.) Type of Home at Discharge: House Discharge Home Layout: One level Discharge Home Access: Stairs to enter Entrance Stairs-Number of Steps: A few steps per his sister Does  the patient have any problems obtaining your medications?: No  Social/Family/Support Systems Patient Roles: Other (Comment)(Has a sister and a friend (he rents a room from friend).) Contact Information: Meliton Samad - sister - [phone] Anticipated Caregiver: Rolene Course - friend Anticipated Caregiver's Contact Information: Jenny Reichmann - friend - [phone] Ability/Limitations of  Caregiver: Jenny Reichmann has experience as an Engineer, production and can assist after rehab.  Sister says she can assist some as well. Caregiver Availability: 24/7 Discharge Plan Discussed with Primary Caregiver: Yes Is Caregiver In Agreement with Plan?: Yes Does Caregiver/Family have Issues with Lodging/Transportation while Pt is in Rehab?: No  Goals/Additional Needs Patient/Family Goal for Rehab: PT/OT/SLP supervision to min assist goals Expected length of stay: 15-19 days Cultural Considerations: [employer] Dietary Needs: Heart diet, thin liquids Equipment Needs: TBD Pt/Family Agrees to Admission and willing to participate: Yes(I spoke with his sister, [name], and Jenny Reichmann, his friend.) Program Orientation Provided & Reviewed with Pt/Caregiver Including Roles  & Responsibilities: Yes  Decrease burden of Care through IP rehab admission: N/A  Possible need for SNF placement upon discharge: Not planned  Patient Condition: This patient's medical and functional status has changed since the consult dated: 07/24/17 in which the Rehabilitation Physician determined and documented that the patient's condition is appropriate for intensive rehabilitative care in an inpatient rehabilitation facility. See "History of Present Illness" (above) for medical update. Functional changes are:  Currently requiring mod assist to ambulate 50 feet +2 HHA. Patient's medical and functional status update has been discussed with the Rehabilitation physician and patient remains appropriate for inpatient rehabilitation. Will admit to inpatient rehab  today.  Preadmission Screen Completed By:  Retta Diones, 07/26/2017 12:13 PM ______________________________________________________________________   Discussed status with Dr. Letta Pate on 07/26/17 at 1212 and received telephone approval for admission today.  Admission Coordinator:  Retta Diones, time 1213/Date 07/16/17

## 2017-07-27 ENCOUNTER — Inpatient Hospital Stay (HOSPITAL_COMMUNITY): Payer: Medicare HMO | Admitting: Physical Therapy

## 2017-07-27 ENCOUNTER — Inpatient Hospital Stay (HOSPITAL_COMMUNITY): Payer: Medicare HMO | Admitting: Speech Pathology

## 2017-07-27 ENCOUNTER — Inpatient Hospital Stay (HOSPITAL_COMMUNITY): Payer: Medicare HMO | Admitting: Occupational Therapy

## 2017-07-27 DIAGNOSIS — R7303 Prediabetes: Secondary | ICD-10-CM

## 2017-07-27 DIAGNOSIS — F32A Depression, unspecified: Secondary | ICD-10-CM

## 2017-07-27 DIAGNOSIS — F329 Major depressive disorder, single episode, unspecified: Secondary | ICD-10-CM

## 2017-07-27 DIAGNOSIS — K5900 Constipation, unspecified: Secondary | ICD-10-CM

## 2017-07-27 LAB — CBC WITH DIFFERENTIAL/PLATELET
Basophils Absolute: 0 10*3/uL (ref 0.0–0.1)
Basophils Relative: 0 %
EOS PCT: 6 %
Eosinophils Absolute: 0.4 10*3/uL (ref 0.0–0.7)
HEMATOCRIT: 45.2 % (ref 39.0–52.0)
Hemoglobin: 15.1 g/dL (ref 13.0–17.0)
LYMPHS PCT: 30 %
Lymphs Abs: 2.2 10*3/uL (ref 0.7–4.0)
MCH: 31.5 pg (ref 26.0–34.0)
MCHC: 33.4 g/dL (ref 30.0–36.0)
MCV: 94.2 fL (ref 78.0–100.0)
MONO ABS: 0.6 10*3/uL (ref 0.1–1.0)
Monocytes Relative: 9 %
Neutro Abs: 4.1 10*3/uL (ref 1.7–7.7)
Neutrophils Relative %: 55 %
PLATELETS: 263 10*3/uL (ref 150–400)
RBC: 4.8 MIL/uL (ref 4.22–5.81)
RDW: 14.8 % (ref 11.5–15.5)
WBC: 7.3 10*3/uL (ref 4.0–10.5)

## 2017-07-27 LAB — COMPREHENSIVE METABOLIC PANEL
ALT: 18 U/L (ref 17–63)
AST: 14 U/L — ABNORMAL LOW (ref 15–41)
Albumin: 3.8 g/dL (ref 3.5–5.0)
Alkaline Phosphatase: 75 U/L (ref 38–126)
Anion gap: 13 (ref 5–15)
BILIRUBIN TOTAL: 0.6 mg/dL (ref 0.3–1.2)
BUN: 34 mg/dL — AB (ref 6–20)
CHLORIDE: 100 mmol/L — AB (ref 101–111)
CO2: 25 mmol/L (ref 22–32)
CREATININE: 1.03 mg/dL (ref 0.61–1.24)
Calcium: 9.8 mg/dL (ref 8.9–10.3)
GFR calc Af Amer: >60 mL/min (ref >60)
Glucose, Bld: 128 mg/dL — ABNORMAL HIGH (ref 65–99)
POTASSIUM: 4.4 mmol/L (ref 3.5–5.1)
Sodium: 138 mmol/L (ref 135–145)
TOTAL PROTEIN: 6.8 g/dL (ref 6.5–8.1)

## 2017-07-27 NOTE — IPOC Note (Addendum)
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 ([place])   Gait disturbance, post-stroke   [employer] of Health (NIH) Stroke Scale limb ataxia score 2, ataxia present in two limbs   Depression   Constipation     Functional Problem List: Nursing Behavior, Sensory, Skin Integrity, Bowel, Endurance, Motor, Medication Management, Safety, Perception  PT Balance, Endurance, Motor, Safety, Sensory  OT Balance, Behavior, Cognition, Endurance, Motor, Perception, Safety, Sensory, Skin Integrity, Vision  SLP Cognition  TR         Basic ADL's: OT Grooming, Bathing, Dressing, Toileting, Eating     Advanced  ADL's: OT Simple Meal Preparation     Transfers: PT Bed Mobility, Bed to Chair, Furniture, Floor, Teacher, early years/pre, Metallurgist: PT Ambulation, Stairs, Emergency planning/management officer     Additional Impairments: OT Fuctional Use of Upper Extremity  SLP Social Cognition   Problem Solving, Memory, Awareness  TR      Anticipated Outcomes Item Anticipated Outcome  Self Feeding Supervision/setup  Swallowing      Basic self-care  Supervision  Toileting  Supervision   Bathroom Transfers Supervision  Bowel/Bladder  Mod I assist  Transfers  supervision  Locomotion  supervision  Communication     Cognition  supervision   Pain  <3  Safety/Judgment  Supervision/min assist   Therapy Plan: PT Intensity: Minimum of 1-2 x/day ,45 to 90 minutes PT Frequency: 5 out of 7 days PT Duration Estimated Length of Stay: 18-21 days OT Intensity: Minimum of 1-2 x/day, 45 to 90 minutes OT Frequency: 5 out of 7 days OT Duration/Estimated Length of Stay: 14-18 days SLP Intensity: Minumum of 1-2  x/day, 30 to 90 minutes SLP Frequency: 1 to 3 out of 7 days SLP Duration/Estimated Length of Stay: 14-18 days     Team Interventions: Nursing Interventions Patient/Family Education, Bowel Management, Disease Management/Prevention, Cognitive Remediation/Compensation, Skin Care/Wound Management, Medication Management, Discharge Planning, Psychosocial Support  PT interventions Ambulation/gait training, Discharge planning, Functional mobility training, Therapeutic Activities, Balance/vestibular training, Neuromuscular re-education, Therapeutic Exercise, Wheelchair propulsion/positioning, UE/LE Strength taining/ROM, Splinting/orthotics, Pain management, DME/adaptive equipment instruction, Cognitive remediation/compensation, Community reintegration, Barrister's clerk education, IT trainer, UE/LE Coordination activities  OT Interventions Training and development officer, Cognitive remediation/compensation, Discharge planning, Disease mangement/prevention, DME/adaptive equipment instruction, Functional electrical stimulation, Functional mobility training, Neuromuscular re-education, Pain management, Patient/family education, Psychosocial support, Self Care/advanced ADL retraining, Skin care/wound managment, Therapeutic Activities, Therapeutic Exercise, UE/LE Strength taining/ROM, UE/LE Coordination activities, Visual/perceptual remediation/compensation  SLP Interventions Cognitive remediation/compensation, Cueing hierarchy, Patient/family education, Internal/external aids, Environmental controls  TR Interventions    SW/CM Interventions Discharge Planning, Psychosocial Support, Patient/Family Education   Barriers to Discharge MD  Medical stability, Home enviroment access/loayout and Lack of/limited family support  Nursing Decreased caregiver support, Medication compliance, Lack of/limited family support, Behavior    PT Inaccessible home environment, Decreased caregiver support, Home environment access/layout     OT Decreased caregiver support    SLP Decreased caregiver support    SW       Team Discharge Planning: Destination: PT-Home ,OT- Home , SLP-Home Projected Follow-up: PT-Home health PT, OT-  Home health OT, SLP-None Projected Equipment Needs: PT-To be determined, OT- 3 in 1 bedside comode, Tub/shower bench, To be determined, SLP-None recommended by SLP Equipment  Details: PT- , OT-  Patient/family involved in discharge planning: PT- Patient,  OT-Patient, SLP-Patient  MD ELOS: 11-16 days. Medical Rehab Prognosis:  Good Assessment: 62 y.o.malewith history of CAD,Hep C, GERD, COPD, lung cancer, multiple prior strokes who was admitted on 07/21/17 with fall, headaches, mental status changes and right sided weakness for few days.He was agitated at admission with question of ETOH withdrawal. UDS negative. CTA headreviewed, showing left basal ganglia infarct. Per report,evolving acute left basal ganglia to temporal stem infarct with old right cerebellar, pontine and left basal ganglia and thalamic stroke with moderate parenchymal brain volume loss, moderate stenosis right M2 segment and moderate stenosis proximal L-SA. 2 D echo showed EF of 55-60% with calcified mitral valve. 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 Dr. Erlinda Hong felt stroke to be due to small vessel disease v/s cardioembolic ---patient reported non-compliance with medication as well as ongoing tobacco use. To continue ASA/Plavix and 30 day event monitor recommended at discharge to rule out A fib. He continues to be oriented to self only with impulsivity, right sided weakness and ataxic gait.Has been requiring sitter for safety.Will set goals for Supervision with PT/OT and Supervision/Min A with SLP.    See Team Conference Notes for weekly updates to the plan of care
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
## 2017-07-27 NOTE — Progress Notes (Signed)
Retta Diones, RN  Rehab Admission Coordinator  Physical Medicine and Rehabilitation  PMR Pre-admission  Signed  Date of Service:  07/26/2017 11:58 AM       Related encounter: ED to Hosp-Admission (Discharged) from 07/21/2017 in [facility]      Signed            _0 Hide copied text  _1 Hover for details   PMR Admission Coordinator Pre-Admission Assessment  Patient: [name] is an 62 y.o., male MRN: [id number] DOB: 1955-09-15 Height: _2  (170.2 cm) Weight: 66.8 kg (147 lb 4.3 oz)                                                                                                                                              Insurance Information HMO: Yes  PPO:       PCP:       IPA:       80/20:       OTHER:   PRIMARY: [employer]      Policy#: [id number]      Subscriber:  Irma Newness  CM Name:  Carriece      Phone#: [phone]     Fax#: [phone] Pre-Cert#: [id number]      Employer: Disabled Benefits:  Phone #: [phone]     Name:  Benjamin Stain. Date: 12/27/16     Deduct: $183 (met $143.88      Out of Pocket Max: [phone] (met $143.88)      Life Max:  N/A CIR: $1860 per admission      SNF: $0 days 1-20; $172 days 21-100 Outpatient: medical necessity     Co-Pay: $40/visit Home Health: 100%      Co-Pay: none DME: 80%     Co-Pay: 20% Providers: in network  SECONDARY: Medicaid of [place]    Policy#: [id number]      Subscriber: Irma Newness CM Name:        Phone#:       Fax#:   Pre-Cert#:        Employer:  Disabled Benefits:  Phone #: [phone]     Name: Automated Eff. 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 UDS negative. CTA headreviewed, showing left basal ganglia infarct. Per report,evolving acute left basal ganglia to temporal stem infarct with old right cerebellar, pontine and left basal ganglia and thalamic stroke with moderate parenchymal brain volume loss, moderate stenosis right M2 segment and moderate stenosis proximal L-SA. 2 D echo showed EF of 55-60% with calcified mitral valve. 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  Dr. Erlinda Hong felt stroke to be due to small vessel disease v/s cardioembolic ---patient reported non-compliance with medication as well as ongoing tobacco use. To continue ASA/Plavix and 30 day event monitor recommended at discharge to rule out A fib. He continues to be oriented to self only with impulsivity, right sided weakness and ataxic gait.Has been requiring sitter for safety.Mentation improving and CIR recommended due to functional deficits.    Total: 2=NIH  Past Medical History      Past Medical History:  Diagnosis Date  . CAD (coronary artery disease) 06/30/2015  . CVA (cerebral vascular accident) ([place])    x4  . GERD (gastroesophageal reflux disease)   . H/O: lung cancer right side  . Headache   . Hepatitis C infection   . History of blood transfusion 1981  . Hypertension 12/28/2010  . NECK PAIN 11/15/2007   Qualifier: Diagnosis of  By: Andria Frames MD, Gwyndolyn Saxon    .  Pain of right lower leg 04/08/2015  . Peripheral arterial disease (HCC)    a. s/p PV angiogram on 07/19/15 with successful mid left SFA chronic total occlusion directional atherectomy followed by drug eluting balloon angioplasty using distal protection.  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 s/p chemo/xrt comp to 2006    Family History  family history includes Cancer in his brother and brother; Coronary artery disease (age of onset: 63) in his brother; Dementia in his mother; Heart attack in his brother; Heart disease in his brother.  Prior Rehab/Hospitalizations: No previous rehab.  Has the patient had major surgery during 100 days prior to admission? No  Current Medications   Current Facility-Administered Medications:  .   stroke: mapping our early stages of recovery book, , Does not apply, Once, Greta Doom, MD .  acetaminophen (TYLENOL) tablet 650 mg, 650 mg, Oral, Q4H PRN, 650 mg at 07/23/17 1210 **OR** acetaminophen (TYLENOL) solution 650 mg, 650 mg, Per Tube, Q4H PRN **OR** acetaminophen (TYLENOL) suppository 650 mg, 650 mg, Rectal, Q4H PRN, Lockamy, Timothy, DO .  aspirin EC tablet 81 mg, 81 mg, Oral, Daily, Rosalin Hawking, MD, 81 mg at 07/26/17 1005 .  atorvastatin (LIPITOR) tablet 80 mg, 80 mg, Oral, Daily, Costello, Mary A, NP, 80 mg at 07/26/17 1005 .  buPROPion (WELLBUTRIN XL) 24 hr tablet 150 mg, 150 mg, Oral, Daily, Bonnita Hollow, MD, 150 mg at 07/26/17 1006 .  clopidogrel (PLAVIX) tablet 75 mg, 75 mg, Oral, Daily, Rosalin Hawking, MD,  75 mg at 07/26/17 1004 .  feeding supplement (ENSURE ENLIVE) (ENSURE ENLIVE) liquid 237 mL, 237 mL, Oral, BID BM, Chambliss, Marshall L, MD, 237 mL at 07/26/17 1002 .  fluticasone furoate-vilanterol (BREO ELLIPTA) 200-25 MCG/INH 1 puff, 1 puff, Inhalation, Daily, Chambliss, Jeb Levering, MD, 1 puff at 07/26/17 0820 .  folic acid  (FOLVITE) tablet 1 mg, 1 mg, Oral, Daily, Everrett Coombe, MD, 1 mg at 07/26/17 1005 .  hydrALAZINE (APRESOLINE) injection 10 mg, 10 mg, Intravenous, Q4H PRN, Lockamy, Timothy, DO .  indapamide (LOZOL) tablet 1.25 mg, 1.25 mg, Oral, Daily, Abraham, Sherin, DO, 1.25 mg at 07/26/17 1006 .  ipratropium-albuterol (DUONEB) 0.5-2.5 (3) MG/3ML nebulizer solution 3 mL, 3 mL, Nebulization, Q6H PRN, Chambliss, Marshall L, MD .  lisinopril (PRINIVIL,ZESTRIL) tablet 2.5 mg, 2.5 mg, Oral, Daily, Abraham, Sherin, DO, 2.5 mg at 07/26/17 1004 .  loratadine (CLARITIN) tablet 10 mg, 10 mg, Oral, Daily, Lockamy, Timothy, DO, 10 mg at 07/26/17 1004 .  multivitamin with minerals tablet 1 tablet, 1 tablet, Oral, Daily, Everrett Coombe, MD, 1 tablet at 07/26/17 1004 .  nicotine (NICODERM CQ - dosed in mg/24 hours) patch 21 mg, 21 mg, Transdermal, Daily, Lockamy, Timothy, DO, 21 mg at 07/26/17 1008 .  nicotine polacrilex (COMMIT) lozenge 4 mg, 4 mg, Oral, Q2H PRN, Mayo, Pete Pelt, MD .  pantoprazole (PROTONIX) EC tablet 40 mg, 40 mg, Oral, Daily, Everrett Coombe, MD, 40 mg at 07/26/17 1005 .  polyethylene glycol (MIRALAX / GLYCOLAX) packet 17 g, 17 g, Oral, Daily, Bonnita Hollow, MD .  senna [facility]) tablet 8.6 mg, 1 tablet, Oral, Daily, Bonnita Hollow, MD .  thiamine (VITAMIN B-1) tablet 100 mg, 100 mg, Oral, Daily, 100 mg at 07/26/17 1005 **OR** thiamine (B-1) injection 100 mg, 100 mg, Intravenous, Daily, Everrett Coombe, MD .  triamcinolone cream (KENALOG) 0.5 % 1 application, 1 application, Topical, TID, Lockamy, Timothy, DO, 1 application at 24/46/28 1006 .  umeclidinium bromide (INCRUSE ELLIPTA) 62.5 MCG/INH 1 puff, 1 puff, Inhalation, Daily, Chambliss, Jeb Levering, MD, 1 puff at 07/26/17 0818  Patients Current Diet: Fall precautions Fall precautions Diet Heart Room service appropriate? Yes; Fluid consistency: Thin  Precautions / Restrictions Precautions Precautions: Fall Precaution Comments: pt high risk fall - no  balance reactions Restrictions Weight Bearing Restrictions: No   Has the patient had 2 or more falls or a fall with injury in the past year?No  Prior Activity Level Community (5-7x/wk): Went out daily.  Was driving.  Home Assistive Devices / Equipment Home Assistive Devices/Equipment: Eyeglasses, Dentures (specify type)  Prior Device Use: Indicate devices/aids used by the patient prior to current illness, exacerbation or injury? None  Prior Functional Level Prior Function Comments: Pt reports independence with ADL but unsure of his true PLOF.   Self Care: Did the patient need help bathing, dressing, using the toilet or eating?  Independent  Indoor Mobility: Did the patient need assistance with walking from room to room (with or without device)? Independent  Stairs: Did the patient need assistance with internal or external stairs (with or without device)? Independent  Functional Cognition: Did the patient need help planning regular tasks such as shopping or remembering to take medications? Independent  Current Functional Level Cognition  Arousal/Alertness: Lethargic(awakens with cues, needs consistent cues for maintaining ) Overall Cognitive Status: No family/caregiver present to determine baseline cognitive functioning Current Attention Level: Selective Orientation Level: Oriented to person, Disoriented to time, Oriented to place, Disoriented to situation Following Commands: Follows multi-step commands inconsistently,  Follows one step commands consistently Safety/Judgement: Decreased awareness of safety General Comments: Follows 2 step commands today with increased time and repetition. Gets rights/lefts mixed up. Impulsive initially. Oriented today. Angry that he did not get his breakfast til lunch time. Attention: Focused Focused Attention: Impaired Memory: Impaired Memory Impairment: Decreased recall of new information Awareness: Impaired Problem Solving:  Impaired Executive Function: Organizing, Sequencing Sequencing: Impaired Organizing: Impaired Behaviors: (hx of agitation, not evidenced with SLP this date) Safety/Judgment: Impaired    Extremity Assessment (includes Sensation/Coordination)  Upper Extremity Assessment: RUE deficits/detail RUE Deficits / Details: Poor proprioception noted with RUE movement during reach to target both with and without visual feedback.   Lower Extremity Assessment: Defer to PT evaluation RLE Deficits / Details: pts right leg is not as strong as left - pt with full AROM but he doesnt automatically use this leg to scoot up in bed, cant hold it upright in hooklying. pt denies sensory changes in leg    ADLs  Overall ADL's : Needs assistance/impaired Eating/Feeding: Sitting, Minimal assistance Grooming: Sitting, Minimal assistance Upper Body Bathing: Sitting, Minimal assistance Lower Body Bathing: Sit to/from stand, Moderate assistance Upper Body Dressing : Sitting, Minimal assistance Lower Body Dressing: Sit to/from stand, Moderate assistance Lower Body Dressing Details (indicate cue type and reason): Assist to maintain balance.  Toilet Transfer: +2 for physical assistance, Moderate assistance Toilet Transfer Details (indicate cue type and reason): Only able to take side steps at EOB today.  Toileting- Clothing Manipulation and Hygiene: Maximal assistance, Sit to/from stand General ADL Comments: Pt with limited tolerance for activity today reporting dizziness in standing position. He did require significant assistance to attend to tasks today. Only able to stand for approximately 30 seconds at a time.     Mobility  Overal bed mobility: Needs Assistance Bed Mobility: Supine to Sit Supine to sit: Min guard, HOB elevated Sit to supine: Min assist General bed mobility comments: Able to get to EOB without assist, cues to use RUE to help.     Transfers  Overall transfer level: Needs  assistance Equipment used: None Transfers: Sit to/from Stand Sit to Stand: Min assist General transfer comment: Assist to stand from EOB x1, from chair x3. Cues for WB through RUE to power up to standing.     Ambulation / Gait / Stairs / Wheelchair Mobility  Ambulation/Gait Ambulation/Gait assistance: Mod assist, +2 physical assistance Ambulation Distance (Feet): 50 Feet(+40') Assistive device: 2 person hand held assist Gait Pattern/deviations: Step-through pattern, Decreased stride length, Decreased stance time - right, Decreased weight shift to left, Ataxic, Narrow base of support, Step-to pattern, Decreased step length - left General Gait Details: Cues to increase step length LLE, for RLE placement. Less lateral lean today. 1 seated rest break. Needs repetition of cues. Right knee ext thrust noted at times esp when fatigued.  Gait velocity: slow Gait velocity interpretation: Below normal speed for age/gender    Posture / Balance Dynamic Sitting Balance Sitting balance - Comments: Able to sit EOB without assist for static sitting but require sclose Min guard for dynamic tasks.  Balance Overall balance assessment: Needs assistance Sitting-balance support: Feet supported, No upper extremity supported Sitting balance-Leahy Scale: Poor Sitting balance - Comments: Able to sit EOB without assist for static sitting but require sclose Min guard for dynamic tasks.  Standing balance support: During functional activity Standing balance-Leahy Scale: Poor Standing balance comment: Requires assist for dynamic standing/gait.    Special needs/care consideration BiPAP/CPAP No CPM No Continuous Drip IV No  Dialysis No      Life Vest No Oxygen No Special Bed No Trach Size No Wound Vac (area) No      Skin No                          Bowel mgmt: Last BM 07/22/17 Bladder mgmt: External catheter Diabetic mgmt No Contact Precautions: Yes   Previous Home Environment Living Arrangements:  Non-relatives/Friends  Lives With: Friend(s) Available Help at Discharge: (Unknown) Type of Home: House Home Care Services: No Additional Comments: pt said he lives alone in a house and walked around safely with no AD.  Unsure accuracy of this information.  Discharge Living Setting Plans for Discharge Living Setting: House, Lives with (comment)(Lives in a house with friend Jenny Reichmann and his wife.) Type of Home at Discharge: House Discharge Home Layout: One level Discharge Home Access: Stairs to enter Entrance Stairs-Number of Steps: A few steps per his sister Does the patient have any problems obtaining your medications?: No  Social/Family/Support Systems Patient Roles: Other (Comment)(Has a sister and a friend (he rents a room from friend).) Contact Information: Elster Corbello - sister - [phone] Anticipated Caregiver: Rolene Course - friend Anticipated Caregiver's Contact Information: Jenny Reichmann - friend - [phone] Ability/Limitations of Caregiver: Jenny Reichmann has experience as an aide and can assist after rehab.  Sister says she can assist some as well. Caregiver Availability: 24/7 Discharge Plan Discussed with Primary Caregiver: Yes Is Caregiver In Agreement with Plan?: Yes Does Caregiver/Family have Issues with Lodging/Transportation while Pt is in Rehab?: No  Goals/Additional Needs Patient/Family Goal for Rehab: PT/OT/SLP supervision to min assist goals Expected length of stay: 15-19 days Cultural Considerations: [employer] Dietary Needs: Heart diet, thin liquids Equipment Needs: TBD Pt/Family Agrees to Admission and willing to participate: Yes(I spoke with his sister, [name], and Jenny Reichmann, his friend.) Program Orientation Provided & Reviewed with Pt/Caregiver Including Roles  & Responsibilities: Yes  Decrease burden of Care through IP rehab admission: N/A  Possible need for SNF placement upon discharge: Not planned  Patient Condition: This patient's medical and functional status  has changed since the consult dated: 07/24/17 in which the Rehabilitation Physician determined and documented that the patient's condition is appropriate for intensive rehabilitative care in an inpatient rehabilitation facility. See "History of Present Illness" (above) for medical update. Functional changes are:  Currently requiring mod assist to ambulate 50 feet +2 HHA. Patient's medical and functional status update has been discussed with the Rehabilitation physician and patient remains appropriate for inpatient rehabilitation. Will admit to inpatient rehab today.  Preadmission Screen Completed By:  Retta Diones, 07/26/2017 12:13 PM ______________________________________________________________________   Discussed status with Dr. Letta Pate on 07/26/17 at 1212 and received telephone approval for admission today.  Admission Coordinator:  Retta Diones, time 1213/Date 07/16/17             Cosigned by: Charlett Blake, MD at 07/26/2017 12:40 PM  Revision History
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
## 2017-07-27 NOTE — Plan of Care (Signed)
  Progressing Consults RH STROKE PATIENT EDUCATION Description See Patient Education module for education specifics  07/27/2017 1731 - Progressing by Brita Romp, RN RH BOWEL ELIMINATION RH STG MANAGE BOWEL WITH ASSISTANCE Description STG Manage Bowel with min Assistance.  07/27/2017 1731 - Progressing by Brita Romp, RN RH STG MANAGE BOWEL W/MEDICATION W/ASSISTANCE Description STG Manage Bowel with Medication with min Assistance.  07/27/2017 1731 - Progressing by Brita Romp, RN RH SKIN INTEGRITY RH STG SKIN FREE OF INFECTION/BREAKDOWN Description Patients skin will remain free from further breakdown or infection with min assist.  07/27/2017 1731 - Progressing by Brita Romp, RN RH SAFETY RH STG ADHERE TO SAFETY PRECAUTIONS W/ASSISTANCE/DEVICE Description STG Adhere to Safety Precautions With min Assistance/Device.  07/27/2017 1731 - Progressing by Brita Romp, RN RH COGNITION-NURSING RH STG USES MEMORY AIDS/STRATEGIES W/ASSIST TO PROBLEM SOLVE Description STG Uses Memory Aids/Strategies With min Assistance to Problem Solve.  07/27/2017 1731 - Progressing by Brita Romp, RN RH KNOWLEDGE DEFICIT RH STG INCREASE KNOWLEDGE OF HYPERTENSION 07/27/2017 1731 - Progressing by Brita Romp, RN

## 2017-07-27 NOTE — Progress Notes (Addendum)
[facility] PHYSICAL MEDICINE & REHABILITATION     PROGRESS NOTE  Subjective/Complaints: Patient seen lying in bed this morning. He states he slept well overnight. He states he is ready to begin therapies today.  ROS: Denies CP, SOB, nausea, vomiting, diarrhea.  Objective: Vital Signs: Blood pressure 113/70, pulse 98, temperature (!) 97.5 F (36.4 C), temperature source Axillary, resp. rate 15, height 5\' 7"  (1.702 m), weight 67 kg (147 lb 11.2 oz), SpO2 96 %. No results found. Recent Labs    07/27/17 0538  WBC 7.3  HGB 15.1  HCT 45.2  PLT 263   Recent Labs    07/25/17 0856  NA 137  K 3.8  CL 104  GLUCOSE 116*  BUN 18  CREATININE 0.87  CALCIUM 9.3   CBG (last 3)  No results for input(s): GLUCAP in the last 72 hours.  Wt Readings from Last 3 Encounters:  07/26/17 67 kg (147 lb 11.2 oz)  07/22/17 66.8 kg (147 lb 4.3 oz)  06/28/17 68.9 kg (151 lb 12.8 oz)    Physical Exam:  BP 113/70 (BP Location: Left Arm)   Pulse 98   Temp (!) 97.5 F (36.4 C) (Axillary)   Resp 15   Ht 5\' 7"  (1.702 m)   Wt 67 kg (147 lb 11.2 oz)   SpO2 96%   BMI 23.13 kg/m  Constitutional: He appearswell-developedand well-nourished. HENT: Normocephalicand atraumatic.  Eyes:EOMare normal. No discharge. Cardiovascular:Normal rateand regular rhythm. No JVD. Respiratory:Effort normaland breath sounds normal.  GI: Bowel sounds are normal. He exhibitsno distension.  Musculoskeletal: He exhibits noedema, no tenderness.  Neurological: He isalertand oriented.  Motor: LUE/LLE: 4-4+/5 proximal to distal 4/5 right delt bi tri grip 3-/5 right HF 4-/5 KE, 2-/5 R ankle DF Skin: Skin iswarmand dry. He isnot diaphoretic.  Psychiatric: His affect isblunt. He iswithdrawn. He expressesimpulsivityand inappropriate judgment.  Assessment/Plan: 1. Functional deficits secondary to left Basal ganglia and pontine infarcts which require 3+ hours per day of interdisciplinary therapy in a  comprehensive inpatient rehab setting. Physiatrist is providing close team supervision and 24 hour management of active medical problems listed below. Physiatrist and rehab team continue to assess barriers to discharge/monitor patient progress toward functional and medical goals.  Function:  Bathing Bathing position      Bathing parts      Bathing assist        Upper Body Dressing/Undressing Upper body dressing                    Upper body assist        Lower Body Dressing/Undressing Lower body dressing                                  Lower body assist        Toileting Toileting          Toileting assist     Transfers Chair/bed transfer             Locomotion Ambulation           Wheelchair          Cognition Comprehension    Expression Expression assist level: Expresses basic needs/ideas: With no assist  Social Interaction Social Interaction assist level: Interacts appropriately with others - No medications needed.  Problem Solving Problem solving assist level: Solves basic 90% of the time/requires cueing < 10% of the time  Memory Memory assist level: More  than reasonable amount of time    Medical Problem List and Plan: 1. Right hemiparesis and ataxia secondary to left Basal ganglia and pontine infarcts   Begin CIR 2. DVT Prophylaxis/Anticoagulation: Pharmaceutical:Lovenox 3. Pain Management:N/A 4. Mood:LCSW to follow for evaluation and support. 5. Neuropsych: This patientis not fullycapable of making decisions on hisown behalf.   Continue tele-sitter 6.Rash/Skin/Wound Care:Continue Triamcinolone cream tid. Monitor rash for healing. Maintain adequate nutritional and hydration status. 7. Fluids/Electrolytes/Nutrition:Monitor I/O.    BUN/CR trending up    Encourage fluids 8. HTN: Monitor BP bid. On Prinivil, indapamide,    Monitor with increased mobility  9. CAD:On ASA and lipitor. 10. H/o Hep C:platelets  stable. No meds 11. COPD/Lung CA s/p XRT: On Breo and Ellipta daily. 12. Depression: On Wellbutrin XL. 13. Constipation: Increased MiraLAX to twice a day  14. Itching/rash: Due to recent bed bug infestation. Added Sarna lotion. 15. Prediabetes   Cont to monitor  LOS (Days) 1 A FACE TO FACE EVALUATION WAS PERFORMED  Ankit [name] 07/27/2017 7:13 AM
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
## 2017-07-28 ENCOUNTER — Inpatient Hospital Stay (HOSPITAL_COMMUNITY): Payer: Medicare HMO | Admitting: Occupational Therapy

## 2017-07-28 ENCOUNTER — Inpatient Hospital Stay (HOSPITAL_COMMUNITY): Payer: Medicare HMO | Admitting: Speech Pathology

## 2017-07-28 ENCOUNTER — Inpatient Hospital Stay (HOSPITAL_COMMUNITY): Payer: Medicare HMO | Admitting: *Deleted

## 2017-07-28 NOTE — Progress Notes (Signed)
Physical Therapy Session Note  Patient Details  Name: [name] MRN: [id number] Date of Birth: March 20, 1956  Today's Date: 07/28/2017 PT Individual Time: 0930-1030 PT Individual Time Calculation (min): 60 min   Short Term Goals: Week 1:  PT Short Term Goal 1 (Week 1): pt will consistently perform functional transfers with mod A PT Short Term Goal 2 (Week 1): pt will gait in controlled environment x 50' with min A  Skilled Therapeutic Interventions/Progress Updates:    PT resting in bed, ready to go. When asked to use RLE for mobility, pt repeats that "it cant." Pt educated on principles of CVA recovery and forced-use.  Supine NMR: Brdiging 2x10; MRE for RLE in flex/ext patterns 2x10, controlled bent-[id number] rotations to targe 2x10 with multi-modal cues for control.  Rolling R/L with cues for forced-use and Min A. Supine>sit with cues and S  Stand-pivot transfer with Mod/max A due to decreased ability to grade movements.  Serial squat-pivot transfers x8 each diretion following demo and cues with Min A overall. Serial sit<>strands with mirror for vuisual feedback 2x10 and multi-modal cues for R-weight shift, Min/Mod A overall. Pt noted to have increased eversion an dexternal cues for increasing neutral alignment.   WC propulsion 3x40' with bil LEs only and RUE as well x25' for increased coordination training. Grip added for assist with cues for stroke efficiency.  Seated FMC for RUE and R-sided attention to task with colored peg design during rest break. Increased time and attention required to sustained tasks.   Gait witth RW 2x40 and RLE ACE wrap for eversion assist. Pt without shoes, so only grip socks. Pt needed cues for step width and length as well as upright posture and Mod A overall, especially with turn to transfer.   Pt encouraged to stay OOB as much as possible during the day, and left up in recliner with lap belt and all needs in reach.   Therapy Documentation Precautions:   Precautions Precautions: Fall Restrictions Weight Bearing Restrictions: No General:   Vital Signs: Oxygen Therapy O2 Device: Room Air Pain: None    See Function Navigator for Current Functional Status.   Therapy/Group: Individual Therapy  Desarie Feild, Corinna Lines, PT, DPT  07/28/2017, 10:00 AM

## 2017-07-28 NOTE — Progress Notes (Signed)
Occupational Therapy Session Note  Patient Details  Name: [name] MRN: [id number] Date of Birth: 12/24/1955  Today's Date: 07/28/2017 OT Individual Time: [id number] and 1445-1530 OT Individual Time Calculation (min): 60 min and 45 min  Skilled Therapeutic Interventions/Progress Updates:    Pt greeted supine in bed, requesting to eat breakfast. Worked on midline orientation, Rt attention, and Rt NMR while self feeding EOB. Pt requiring mod vcs to correct Rt lean throughout, able to manipulate utensils using Rt hand with minimal spillage. Unable to meet FM demands with Rt when trying to open juice containers. Afterwards he completed UB/LB dressing, requiring overall steady assist when elevating pants in standing. He required cues for hemi techniques and to position LEs interchangeably into figure 4. He then completed stand pivot<w/c with Mod A. Pt completing oral care/grooming tasks w/c level (pt declining to stand due to fatigue). He required min vcs for scanning to Rt for necessary items. Also instructed him on ways to increase challenge with Rt hand, including using it for squeezing containers and for more precise FM tasks. Afterwards worked on shoulder/UE stretches to decrease UE tightness/soreness. Pt easily fatigued afterwards and required encouragement to remain up in w/c for next therapist. Pt left with all needs within reach and safety belt fastened at session exit.   2nd Session 1:1 tx (45 min) Pt greeted supine in bed. Agreeable to tx with encouragement. Worked on Rt [place] EOB with use of small geometric blocks. He exhibited deficits with spatial orientation and precise finger movements. Increased time required for hand translations. Throughout, pt easily frustrated by tasks and required encouragement to continue participation. At one point he asked therapist why he had a stroke. During discussion, educated pt on various risk factors that pertained to his lifestyle (I.e. Smoking, being  sedentary, consuming processed foods with high sodium/fat content, and stress). Pt appearing surprised by education, reported gratefulness for the information, but still troubled by his functional changes post CVA. Provided him with emotional support and encouragement for increasing volition and feelings of self efficacy at CIR. At end of session pt transitioned to supine and was left with all needs within reach and bed alarm set.   Also provided him with room HEP to continue working on remediation of Rt FM skills.   Therapy Documentation Precautions:  Precautions Precautions: Fall Restrictions Weight Bearing Restrictions: No Vital Signs: Therapy Vitals Temp: (!) 97.4 F (36.3 C) Temp Source: Oral Pulse Rate: 95 Resp: 18 BP: 109/68 Patient Position (if appropriate): Lying Oxygen Therapy SpO2: 95 % O2 Device: Room Air Pain: C/o soreness with increased R UE activity. Subsides with rest per report   ADL:      See Function Navigator for Current Functional Status.   Therapy/Group: Individual Therapy  Trevaris Pennella A Kaisyn Millea 07/28/2017, 5:24 PM

## 2017-07-28 NOTE — Progress Notes (Signed)
Speech Language Pathology Daily Session Note  Patient Details  Name: [name] MRN: [id number] Date of Birth: 1955/11/08  Today's Date: 07/28/2017 SLP Individual Time: 1330-1400 SLP Individual Time Calculation (min): 30 min  Short Term Goals: Week 1: SLP Short Term Goal 1 (Week 1): Pt will utilize external memory aids to facilitate recall of daily information with supervision.  SLP Short Term Goal 2 (Week 1): Pt will return demonstration of at least 2 safety precautions during functional tasks with supervision.  SLP Short Term Goal 3 (Week 1): Pt will complete familiar tasks with supervision cues for functional problem solving.   Skilled Therapeutic Interventions: Skilled treatment session focused on cognition goals. SLP facilitated session by providing question cues to answer questions regarding current situation and demonstrate intellectual awareness. Pt able to recall session from earlier in day with Min A cues. Pt was left upright in bed with all needs within reach. Continue per current plan of care.      Function:  Eating Eating   Modified Consistency Diet: No Eating Assist Level: Assistive Device;More than reasonable amount of time Eating Assistive Device Comment: (dentures)         Cognition Comprehension Comprehension assist level: Follows basic conversation/direction with no assist  Expression   Expression assist level: Expresses basic needs/ideas: With no assist  Social Interaction Social Interaction assist level: Interacts appropriately 90% of the time - Needs monitoring or encouragement for participation or interaction.  Problem Solving Problem solving assist level: Solves basic 75 - 89% of the time/requires cueing 10 - 24% of the time  Memory Memory assist level: Recognizes or recalls 75 - 89% of the time/requires cueing 10 - 24% of the time    Pain    Therapy/Group: Individual Therapy  Tanairy Payeur 07/28/2017, 2:11 PM

## 2017-07-28 NOTE — Progress Notes (Signed)
[place] PHYSICAL MEDICINE & REHABILITATION     PROGRESS NOTE  Subjective/Complaints:  No issues overnite, doesn't like the bed alarm ROS: Denies CP, SOB, nausea, vomiting, diarrhea.  Objective: Vital Signs: Blood pressure 119/71, pulse 88, temperature 98.2 F (36.8 C), temperature source Oral, resp. rate 18, height 5\' 7"  (1.702 m), weight 67.6 kg (149 lb 0.5 oz), SpO2 98 %. No results found. Recent Labs    07/27/17 0538  WBC 7.3  HGB 15.1  HCT 45.2  PLT 263   Recent Labs    07/25/17 0856 07/27/17 0538  NA 137 138  K 3.8 4.4  CL 104 100*  GLUCOSE 116* 128*  BUN 18 34*  CREATININE 0.87 1.03  CALCIUM 9.3 9.8   CBG (last 3)  No results for input(s): GLUCAP in the last 72 hours.  Wt Readings from Last 3 Encounters:  07/27/17 67.6 kg (149 lb 0.5 oz)  07/22/17 66.8 kg (147 lb 4.3 oz)  06/28/17 68.9 kg (151 lb 12.8 oz)    Physical Exam:  BP 119/71 (BP Location: Left Arm)   Pulse 88   Temp 98.2 F (36.8 C) (Oral)   Resp 18   Ht 5\' 7"  (1.702 m)   Wt 67.6 kg (149 lb 0.5 oz)   SpO2 98%   BMI 23.34 kg/m  Constitutional: He appearswell-developedand well-nourished. HENT: Normocephalicand atraumatic.  Eyes:EOMare normal. No discharge. Cardiovascular:Normal rateand regular rhythm. No JVD. Respiratory:Effort normaland breath sounds normal.  GI: Bowel sounds are normal. He exhibitsno distension.  Musculoskeletal: He exhibits noedema, no tenderness.  Neurological: He isalertand oriented.  Motor: LUE/LLE: 4-4+/5 proximal to distal 4/5 right delt bi tri grip 3-/5 right HF 4-/5 KE, 2-/5 R ankle DF Skin: Skin iswarmand dry. He isnot diaphoretic.  Psychiatric: His affect isblunt. He iswithdrawn. He expressesimpulsivityand inappropriate judgment.  Assessment/Plan: 1. Functional deficits secondary to left Basal ganglia and pontine infarcts which require 3+ hours per day of interdisciplinary therapy in a comprehensive inpatient rehab  setting. Physiatrist is providing close team supervision and 24 hour management of active medical problems listed below. Physiatrist and rehab team continue to assess barriers to discharge/monitor patient progress toward functional and medical goals.  Function:  Bathing Bathing position   Position: Shower  Bathing parts Body parts bathed by patient: Right arm, Left arm, Chest, Abdomen, Front perineal area, Buttocks, Right upper leg, Left upper leg Body parts bathed by helper: Right lower leg, Left lower leg, Back  Bathing assist        Upper Body Dressing/Undressing Upper body dressing   What is the patient wearing?: Pull over shirt/dress     Pull over shirt/dress - Perfomed by patient: Put head through opening, Pull shirt over trunk, Thread/unthread left sleeve Pull over shirt/dress - Perfomed by helper: Thread/unthread right sleeve        Upper body assist Assist Level: Touching or steadying assistance(Pt > 75%)      Lower Body Dressing/Undressing Lower body dressing   What is the patient wearing?: Pants, Non-skid slipper socks     Pants- Performed by patient: Thread/unthread left pants leg Pants- Performed by helper: Thread/unthread right pants leg, Pull pants up/down Non-skid slipper socks- Performed by patient: Don/doff right sock, Don/doff left sock                    Lower body assist Assist for lower body dressing: (Mod assist)      Toileting Toileting          Toileting assist  Transfers Chair/bed transfer     Chair/bed transfer assist level: Maximal assist (Pt 25 - 49%/lift and lower)       Locomotion Ambulation     Max distance: 5 Assist level: Maximal assist (Pt 25 - 49%)   Wheelchair     Max wheelchair distance: 10 Assist Level: Touching or steadying assistance (Pt > 75%)  Cognition Comprehension Comprehension assist level: Follows basic conversation/direction with extra time/assistive device  Expression Expression assist level:  Expresses basic needs/ideas: With no assist  Social Interaction Social Interaction assist level: Interacts appropriately with others with medication or extra time (anti-anxiety, antidepressant).  Problem Solving Problem solving assist level: Solves basic 75 - 89% of the time/requires cueing 10 - 24% of the time  Memory Memory assist level: Recognizes or recalls 75 - 89% of the time/requires cueing 10 - 24% of the time    Medical Problem List and Plan: 1. 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 DVT Prophylaxis/Anticoagulation: Pharmaceutical:Lovenox, PLT normal on 3/1 3. Pain Management:N/A 4. Mood:LCSW to follow for evaluation and support. 5. Neuropsych: This patientis not fullycapable of making decisions on hisown behalf.   Continue tele-sitter 6.Rash/Skin/Wound Care:Continue Triamcinolone cream tid. Monitor rash for healing. Maintain adequate nutritional and hydration status. 7. Fluids/Electrolytes/Nutrition:Monitor I/O.    BUN/CR trending up    Encourage fluids, intake up to 620ml on 3/1 8. HTN: Monitor BP bid. On Prinivil, indapamide,   Vitals:   07/27/17 1439 07/28/17 0559  BP: 104/66 119/71  Pulse: (!) 109 88  Resp: 16 18  Temp: 98.2 F (36.8 C) 98.2 F (36.8 C)  SpO2: 96% 98%  Controlled   9. CAD:On ASA and lipitor. 10. H/o Hep C:platelets stable. No meds 11. COPD/Lung CA s/p XRT: On Breo and Ellipta daily. 12. Depression: On Wellbutrin XL. 13. Constipation: Increased MiraLAX to twice a day  14. Itching/rash: Due to recent bed bug infestation. Added Sarna lotion. 15. Prediabetes   Cont to monitor  LOS (Days) 2 A FACE TO FACE EVALUATION WAS PERFORMED  Charlett Blake 07/28/2017 8:09 AM

## 2017-07-29 ENCOUNTER — Other Ambulatory Visit: Payer: Self-pay

## 2017-07-29 NOTE — Progress Notes (Signed)
[place] PHYSICAL MEDICINE & REHABILITATION     PROGRESS NOTE  Subjective/Complaints:  No issues overnite, pt had small BM yesterday, voiding ok ROS: Denies CP, SOB, nausea, vomiting, diarrhea.  Objective: Vital Signs: Blood pressure 116/68, pulse 92, temperature (!) 97.5 F (36.4 C), temperature source Oral, resp. rate 16, height 5\' 7"  (1.702 m), weight 67.6 kg (149 lb 0.5 oz), SpO2 98 %. No results found. Recent Labs    07/27/17 0538  WBC 7.3  HGB 15.1  HCT 45.2  PLT 263   Recent Labs    07/27/17 0538  NA 138  K 4.4  CL 100*  GLUCOSE 128*  BUN 34*  CREATININE 1.03  CALCIUM 9.8   CBG (last 3)  No results for input(s): GLUCAP in the last 72 hours.  Wt Readings from Last 3 Encounters:  07/27/17 67.6 kg (149 lb 0.5 oz)  07/22/17 66.8 kg (147 lb 4.3 oz)  06/28/17 68.9 kg (151 lb 12.8 oz)    Physical Exam:  BP 116/68 (BP Location: Left Arm)   Pulse 92   Temp (!) 97.5 F (36.4 C) (Oral)   Resp 16   Ht 5\' 7"  (1.702 m)   Wt 67.6 kg (149 lb 0.5 oz)   SpO2 98%   BMI 23.34 kg/m  Constitutional: He appearswell-developedand well-nourished. HENT: Normocephalicand atraumatic.  Eyes:EOMare normal. No discharge. Cardiovascular:Normal rateand regular rhythm. No JVD. Respiratory:Effort normaland breath sounds normal.  GI: Bowel sounds are normal. He exhibitsno distension.  Musculoskeletal: He exhibits noedema, no tenderness.  Neurological: He isalertand oriented.  Motor: LUE/LLE: 4-4+/5 proximal to distal 4/5 right delt bi tri grip 3-/5 right HF 4-/5 KE, 2-/5 R ankle DF Skin: Skin iswarmand dry. He isnot diaphoretic.  Psychiatric: His affect isblunt. He iswithdrawn. He expressesimpulsivityand inappropriate judgment.  Assessment/Plan: 1. Functional deficits secondary to left Basal ganglia and pontine infarcts which require 3+ hours per day of interdisciplinary therapy in a comprehensive inpatient rehab setting. Physiatrist is providing close  team supervision and 24 hour management of active medical problems listed below. Physiatrist and rehab team continue to assess barriers to discharge/monitor patient progress toward functional and medical goals.  Function:  Bathing Bathing position   Position: Shower  Bathing parts Body parts bathed by patient: Right arm, Left arm, Chest, Abdomen, Front perineal area, Buttocks, Right upper leg, Left upper leg Body parts bathed by helper: Right lower leg, Left lower leg, Back  Bathing assist        Upper Body Dressing/Undressing Upper body dressing   What is the patient wearing?: Pull over shirt/dress     Pull over shirt/dress - Perfomed by patient: Put head through opening, Pull shirt over trunk, Thread/unthread left sleeve, Thread/unthread right sleeve Pull over shirt/dress - Perfomed by helper: Thread/unthread right sleeve        Upper body assist Assist Level: Supervision or verbal cues      Lower Body Dressing/Undressing Lower body dressing   What is the patient wearing?: Pants, Non-skid slipper socks     Pants- Performed by patient: Thread/unthread right pants leg, Thread/unthread left pants leg, Pull pants up/down Pants- Performed by helper: Thread/unthread right pants leg, Pull pants up/down Non-skid slipper socks- Performed by patient: Don/doff right sock, Don/doff left sock                    Lower body assist Assist for lower body dressing: Touching or steadying assistance (Pt > 75%)      Toileting Toileting  Toileting assist     Transfers Chair/bed transfer     Chair/bed transfer assist level: Maximal assist (Pt 25 - 49%/lift and lower) Chair/bed transfer assistive device: Armrests     Locomotion Ambulation     Max distance: 25 Assist level: Moderate assist (Pt 50 - 74%)   Wheelchair     Max wheelchair distance: 40 Assist Level: Touching or steadying assistance (Pt > 75%)  Cognition Comprehension Comprehension assist level:  Follows basic conversation/direction with no assist  Expression Expression assist level: Expresses basic needs/ideas: With no assist  Social Interaction Social Interaction assist level: Interacts appropriately 90% of the time - Needs monitoring or encouragement for participation or interaction.  Problem Solving Problem solving assist level: Solves basic 75 - 89% of the time/requires cueing 10 - 24% of the time  Memory Memory assist level: Recognizes or recalls 75 - 89% of the time/requires cueing 10 - 24% of the time    Medical Problem List and Plan: 1. 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 DVT Prophylaxis/Anticoagulation: Pharmaceutical:Lovenox, PLT normal on 3/1 3. Pain Management:N/A 4. Mood:LCSW to follow for evaluation and support. 5. Neuropsych: This patientis not fullycapable of making decisions on hisown behalf.   Continue tele-sitter 6.Rash/Skin/Wound Care:Continue Triamcinolone cream tid. Monitor rash for healing. Maintain adequate nutritional and hydration status. 7. Fluids/Electrolytes/Nutrition:Monitor I/O.    BUN/CR trending up , recheck in am   Encourage fluids, intake down to 218ml on 3/2 8. HTN: Monitor BP bid. On Prinivil, indapamide,   Vitals:   07/28/17 1450 07/29/17 0605  BP: 109/68 116/68  Pulse: 95 92  Resp: 18 16  Temp: (!) 97.4 F (36.3 C) (!) 97.5 F (36.4 C)  SpO2: 95% 98%  Controlled 3/3 monitor for dizziness given poor fluid intake   9. CAD:On ASA and lipitor. 10. H/o Hep C:platelets stable. No meds 11. COPD/Lung CA s/p XRT: On Breo and Ellipta daily. 12. Depression: On Wellbutrin XL. 13. Constipation: Increased MiraLAX to twice a day  14. Itching/rash: Due to recent bed bug infestation. Added Sarna lotion. 15. Prediabetes   Cont to monitor  LOS (Days) 3 A FACE TO FACE EVALUATION WAS PERFORMED  Charlett Blake 07/29/2017 7:26 AM

## 2017-07-30 ENCOUNTER — Inpatient Hospital Stay (HOSPITAL_COMMUNITY): Payer: Medicare HMO | Admitting: Physical Therapy

## 2017-07-30 ENCOUNTER — Inpatient Hospital Stay (HOSPITAL_COMMUNITY): Payer: Medicare HMO

## 2017-07-30 ENCOUNTER — Inpatient Hospital Stay (HOSPITAL_COMMUNITY): Payer: Medicare HMO | Admitting: Occupational Therapy

## 2017-07-30 DIAGNOSIS — E871 Hypo-osmolality and hyponatremia: Secondary | ICD-10-CM

## 2017-07-30 DIAGNOSIS — N179 Acute kidney failure, unspecified: Secondary | ICD-10-CM

## 2017-07-30 LAB — GLUCOSE, CAPILLARY
GLUCOSE-CAPILLARY: 121 mg/dL — AB (ref 65–99)
GLUCOSE-CAPILLARY: 250 mg/dL — AB (ref 65–99)
Glucose-Capillary: 103 mg/dL — ABNORMAL HIGH (ref 65–99)

## 2017-07-30 LAB — BASIC METABOLIC PANEL
Anion gap: 11 (ref 5–15)
BUN: 38 mg/dL — ABNORMAL HIGH (ref 6–20)
CHLORIDE: 97 mmol/L — AB (ref 101–111)
CO2: 26 mmol/L (ref 22–32)
CREATININE: 1.09 mg/dL (ref 0.61–1.24)
Calcium: 9.7 mg/dL (ref 8.9–10.3)
Glucose, Bld: 128 mg/dL — ABNORMAL HIGH (ref 65–99)
Potassium: 4.7 mmol/L (ref 3.5–5.1)
SODIUM: 134 mmol/L — AB (ref 135–145)

## 2017-07-30 MED ORDER — INSULIN ASPART 100 UNIT/ML ~~LOC~~ SOLN
0.0000 [IU] | Freq: Three times a day (TID) | SUBCUTANEOUS | Status: DC
  Administered 2017-07-30: 1 [IU] via SUBCUTANEOUS

## 2017-07-30 NOTE — Care Management Note (Signed)
Inpatient [facility] Individual Statement of Services  Patient Name:  [name]  Date:  07/30/2017  Welcome to the [facility].  Our goal is to provide you with an individualized program based on your diagnosis and situation, designed to meet your specific needs.  With this comprehensive rehabilitation program, you will be expected to participate in at least 3 hours of rehabilitation therapies Monday-Friday, with modified therapy programming on the weekends.  Your rehabilitation program will include the following services:  Physical Therapy (PT), Occupational Therapy (OT), Speech Therapy (ST), 24 hour per day rehabilitation nursing, Therapeutic Recreaction (TR), Neuropsychology, Case Management (Social Worker), Rehabilitation Medicine, Nutrition Services and Pharmacy Services  Weekly team conferences will be held on Wednesdays to discuss your progress.  Your Social Worker will talk with you frequently to get your input and to update you on team discussions.  Team conferences with you and your family in attendance may also be held.  Expected length of stay: 14-18 days    Overall anticipated outcome: supervision  Depending on your progress and recovery, your program may change. Your Social Worker will coordinate services and will keep you informed of any changes. Your Social Worker's name and contact numbers are listed  below.  The following services may also be recommended but are not provided by the [facility] will be made to provide these services after discharge if needed.  Arrangements include referral to agencies that provide these services.  Your insurance has been verified to be:  [employer] and Medicaid Your primary doctor is:  [facility]  Pertinent information will be shared with your doctor and your insurance  company.  Social Worker:  [place], [place] or ([phone]   Information discussed with and copy given to patient by: Lennart Pall, 07/30/2017, 1:05 PM

## 2017-07-30 NOTE — Progress Notes (Signed)
Pt attempted to void sitting at edge of bed x2 this morning with no success. RN bladder performed bladder scan with results of 104. Pt encouraged to increase fluid intake. Will continue to monitor bladder function.

## 2017-07-30 NOTE — Progress Notes (Signed)
Occupational Therapy Session Note  Patient Details  Name: [name] MRN: [id number] Date of Birth: November 22, 1955  Today's Date: 07/30/2017 OT Individual Time: 1000-1055 OT Individual Time Calculation (min): 55 min    Short Term Goals: Week 1:  OT Short Term Goal 1 (Week 1): Pt will complete bathing with min assist OT Short Term Goal 2 (Week 1): Pt will complete LB dressing with min assist OT Short Term Goal 3 (Week 1): Pt will complete 2 grooming tasks in standing  OT Short Term Goal 4 (Week 1): Pt will utilize RUE as gross assist during self-care tasks with min cues  Skilled Therapeutic Interventions/Progress Updates:    Treatment session with focus on RUE NMR.  Pt received supine in bed asleep, requiring increased time and tactile cues to arouse.  Pt reports extreme fatigue post stroke.  Educated on stroke symptoms and fatigue post stroke with pt reporting understanding.  Mod assist stand pivot bed > w/c due to impulsivity and decreased sensation/proprioception in RLE.  Pt propelled w/c 50' with min cues due to decreased strength in RUE and tendency to veer to Rt.  Engaged in [place], gross, and fine motor control in sitting and standing.  Pt required min guard during standing tasks, incorporating reaching across midline to challenge standing balance and functional use of RUE.  Engaged in [place] of block and removing beads from theraputty with focus on fine motor coordination and improved precision with movements.  Pt reporting fatigue throughout session, requesting to return to room.  Provided pt with fine motor control handout to increase functional use of RUE.  Pt returned to bed and left supine in bed with all needs in reach.  Therapy Documentation Precautions:  Precautions Precautions: Fall Restrictions Weight Bearing Restrictions: No General:   Vital Signs: Oxygen Therapy SpO2: 98 % O2 Device: Room Air Pain: Pain Assessment Pain Assessment: No/denies  pain  See Function Navigator for Current Functional Status.   Therapy/Group: Individual Therapy  [name] 07/30/2017, 12:09 PM

## 2017-07-30 NOTE — Progress Notes (Signed)
Speech Language Pathology Daily Session Note  Patient Details  Name: [name] MRN: [id number] Date of Birth: Jul 08, 1955  Today's Date: 07/30/2017 SLP Individual Time: 1300-1400 SLP Individual Time Calculation (min): 60 min  Short Term Goals: Week 1: SLP Short Term Goal 1 (Week 1): Pt will utilize external memory aids to facilitate recall of daily information with supervision.  SLP Short Term Goal 2 (Week 1): Pt will return demonstration of at least 2 safety precautions during functional tasks with supervision.  SLP Short Term Goal 3 (Week 1): Pt will complete familiar tasks with supervision cues for functional problem solving.   Skilled Therapeutic Interventions: Skilled ST services focused on cognitive skills. SLP facilitated recall of today's therapy events in spaced retrieval tasks requiring initial review of events and then supervision a verbal cues up to 30 minute intervals. SLP facilitated recall of personal safety protocol requiring supervision A verbal and question cues. SLP facilitated basic problem solving and safety awareness utilizing 4-6 sequence cards and picture cards with unsafe situations requiring Min A verbal cues. Pt was left in room with call bell within reach. Recommend to continue skilled ST services.       Function:  Eating Eating   Modified Consistency Diet: No Eating Assist Level: Assistive Device;More than reasonable amount of time           Cognition Comprehension Comprehension assist level: Follows basic conversation/direction with no assist  Expression   Expression assist level: Expresses basic needs/ideas: With no assist  Social Interaction Social Interaction assist level: Interacts appropriately 90% of the time - Needs monitoring or encouragement for participation or interaction.  Problem Solving Problem solving assist level: Solves basic 75 - 89% of the time/requires cueing 10 - 24% of the time  Memory Memory assist level: Recognizes or  recalls 75 - 89% of the time/requires cueing 10 - 24% of the time    Pain Pain Assessment Pain Assessment: No/denies pain  Therapy/Group: Individual Therapy  Marji Kuehnel  [facility] 07/30/2017, 3:44 PM

## 2017-07-30 NOTE — Progress Notes (Addendum)
Physical Therapy Note  Patient Details  Name: Dyland Panuco MRN: [id number] Date of Birth: March 27, 1956 Today's Date: 07/30/2017    Time: 830-940 70 minutes  1:1 No c/o pain.  Sit to stand and standing balance for upper and lower body dressing with min A.  Squat pivot transfers with min A to w/c.  Pt improving w/c mobility with bilat UEs with supervision.  gait with RW 3 x 50' with min A, continues with eversion, limited by tone.  PT performs rotations, deep pressure to decrease Rt eversion tone, pt reports it feels "Funny".  Stair negotiation with bilat handrails 4 steps x 2 with min A.  Floor transfer with education on fall recovery and safety at home with pt able to perform with min A.  Standing tap ups with focus on Rt LE coordination.  Pt asks to stand to urinate, pt performs with min guard. Pt given HEP for supine NMR exercises, pt verbalizes understanding. Pt left in bed with needs at hand, alarm on.  Morgyn Marut 07/30/2017, 9:28 AM

## 2017-07-30 NOTE — Progress Notes (Signed)
[facility] PHYSICAL MEDICINE & REHABILITATION     PROGRESS NOTE  Subjective/Complaints:  Pt seen lying in bed this AM.  He slept well overnight.  He states he "feeld different on his right side".  ROS: Denies CP, SOB, nausea, vomiting, diarrhea.  Objective: Vital Signs: Blood pressure 122/76, pulse 86, temperature 97.8 F (36.6 C), temperature source Oral, resp. rate 18, height 5\' 7"  (1.702 m), weight 67.6 kg (149 lb 0.5 oz), SpO2 98 %. No results found. No results for input(s): WBC, HGB, HCT, PLT in the last 72 hours. Recent Labs    07/30/17 0802  NA 134*  K 4.7  CL 97*  GLUCOSE 128*  BUN 38*  CREATININE 1.09  CALCIUM 9.7   CBG (last 3)  No results for input(s): GLUCAP in the last 72 hours.  Wt Readings from Last 3 Encounters:  07/27/17 67.6 kg (149 lb 0.5 oz)  07/22/17 66.8 kg (147 lb 4.3 oz)  06/28/17 68.9 kg (151 lb 12.8 oz)    Physical Exam:  BP 122/76 (BP Location: Left Arm)   Pulse 86   Temp 97.8 F (36.6 C) (Oral)   Resp 18   Ht 5\' 7"  (1.702 m)   Wt 67.6 kg (149 lb 0.5 oz)   SpO2 98%   BMI 23.34 kg/m  Constitutional: He appearswell-developedand well-nourished. HENT: Normocephalicand atraumatic.  Eyes:EOMare normal. No discharge. Cardiovascular:RRR. No JVD. Respiratory:Effort normal and breath sounds normal.  GI: Bowel sounds are normal. He exhibitsno distension.  Musculoskeletal: He exhibits noedema, no tenderness.  Neurological: He isalertand oriented.  Motor: LUE/LLE: 4-4+/5 proximal to distal4 4/5 right delt bi tri grip 2+/5 right HF 3-/5 KE, 2-/5 R ankle DF Skin: Skin iswarmand dry. He isnot diaphoretic.  Psychiatric: His affect isblunt. He iswithdrawn. He expressesimpulsivityand inappropriate judgment.  Assessment/Plan: 1. Functional deficits secondary to left Basal ganglia and pontine infarcts which require 3+ hours per day of interdisciplinary therapy in a comprehensive inpatient rehab setting. Physiatrist is providing  close team supervision and 24 hour management of active medical problems listed below. Physiatrist and rehab team continue to assess barriers to discharge/monitor patient progress toward functional and medical goals.  Function:  Bathing Bathing position   Position: Shower  Bathing parts Body parts bathed by patient: Right arm, Chest, Abdomen, Front perineal area, Right upper leg, Left upper leg Body parts bathed by helper: Left arm, Buttocks, Right lower leg, Left lower leg, Back  Bathing assist        Upper Body Dressing/Undressing Upper body dressing   What is the patient wearing?: Pull over shirt/dress     Pull over shirt/dress - Perfomed by patient: Thread/unthread right sleeve, Thread/unthread left sleeve, Put head through opening, Pull shirt over trunk Pull over shirt/dress - Perfomed by helper: Thread/unthread right sleeve        Upper body assist Assist Level: Touching or steadying assistance(Pt > 75%)      Lower Body Dressing/Undressing Lower body dressing   What is the patient wearing?: Pants, Non-skid slipper socks     Pants- Performed by patient: Thread/unthread right pants leg, Thread/unthread left pants leg, Pull pants up/down Pants- Performed by helper: Thread/unthread right pants leg, Pull pants up/down Non-skid slipper socks- Performed by patient: Don/doff right sock, Don/doff left sock Non-skid slipper socks- Performed by helper: Don/doff right sock, Don/doff left sock                  Lower body assist Assist for lower body dressing: Touching or steadying assistance (Pt >  75%)      Toileting Toileting   Toileting steps completed by patient: (P) Adjust clothing prior to toileting, Performs perineal hygiene, Adjust clothing after toileting   Toileting Assistive Devices: (P) Grab bar or rail  Toileting assist Assist level: (P) Two helpers   Transfers Chair/bed transfer     Chair/bed transfer assist level: Maximal assist (Pt 25 - 49%/lift and  lower) Chair/bed transfer assistive device: Armrests     Locomotion Ambulation     Max distance: 25 Assist level: Moderate assist (Pt 50 - 74%)   Wheelchair     Max wheelchair distance: 40 Assist Level: Touching or steadying assistance (Pt > 75%)  Cognition Comprehension Comprehension assist level: Follows basic conversation/direction with no assist  Expression Expression assist level: Expresses basic needs/ideas: With no assist  Social Interaction Social Interaction assist level: Interacts appropriately 90% of the time - Needs monitoring or encouragement for participation or interaction.  Problem Solving Problem solving assist level: Solves basic 75 - 89% of the time/requires cueing 10 - 24% of the time  Memory Memory assist level: Recognizes or recalls 75 - 89% of the time/requires cueing 10 - 24% of the time    Medical Problem List and Plan: 1. 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 DVT Prophylaxis/Anticoagulation: Pharmaceutical:Lovenox 3. Pain Management:N/A 4. Mood:LCSW to follow for evaluation and support. 5. Neuropsych: This patientis not fullycapable of making decisions on hisown behalf.   Continue tele-sitter 6.Rash/Skin/Wound Care:Continue Triamcinolone cream tid. Monitor rash for healing. Maintain adequate nutritional and hydration status. 7. Fluids/Electrolytes/Nutrition:Monitor I/O.  8. HTN: Monitor BP bid. On Prinivil, indapamide,   Vitals:   07/30/17 0106 07/30/17 0818  BP: 122/76   Pulse: 86   Resp: 18   Temp: 97.8 F (36.6 C)   SpO2: 98% 98%    Controlled 3/4 9. CAD:On ASA and lipitor. 10. H/o Hep C:platelets stable. No meds 11. COPD/Lung CA s/p XRT: On Breo and Ellipta daily. 12. Depression: On Wellbutrin XL. 13. Constipation: Increased MiraLAX to twice a day  14. Itching/rash: Due to recent bed bug infestation. Added Sarna lotion. 15. Prediabetes   Cont to monitor   CBGs  ordered   SSI ordered   Diet changed to Carb modified 16. Hyponatremia   Na 134 on 3/4   Cont to monitor 17. AKI   Cr 1.09 on 3/4   Encourage fluids  LOS (Days) 4 A FACE TO FACE EVALUATION WAS PERFORMED  Ankit [name] 07/30/2017 9:51 AM

## 2017-07-31 ENCOUNTER — Inpatient Hospital Stay (HOSPITAL_COMMUNITY): Payer: Medicare HMO | Admitting: Physical Therapy

## 2017-07-31 ENCOUNTER — Inpatient Hospital Stay (HOSPITAL_COMMUNITY): Payer: Medicare HMO | Admitting: Occupational Therapy

## 2017-07-31 ENCOUNTER — Encounter (HOSPITAL_COMMUNITY): Payer: Medicare HMO | Admitting: Psychology

## 2017-07-31 DIAGNOSIS — K5901 Slow transit constipation: Secondary | ICD-10-CM

## 2017-07-31 LAB — GLUCOSE, CAPILLARY
GLUCOSE-CAPILLARY: 118 mg/dL — AB (ref 65–99)
GLUCOSE-CAPILLARY: 130 mg/dL — AB (ref 65–99)
GLUCOSE-CAPILLARY: 102 mg/dL — AB (ref 65–99)
Glucose-Capillary: 107 mg/dL — ABNORMAL HIGH (ref 65–99)

## 2017-07-31 MED ORDER — SENNOSIDES-DOCUSATE SODIUM 8.6-50 MG PO TABS
2.0000 | ORAL_TABLET | Freq: Every day | ORAL | Status: DC
  Administered 2017-07-31: 2 via ORAL
  Filled 2017-07-31: qty 2

## 2017-07-31 MED ORDER — INSULIN ASPART 100 UNIT/ML ~~LOC~~ SOLN
0.0000 [IU] | Freq: Three times a day (TID) | SUBCUTANEOUS | Status: DC
  Administered 2017-07-31 – 2017-08-02 (×4): 1 [IU] via SUBCUTANEOUS
  Administered 2017-08-03: 2 [IU] via SUBCUTANEOUS
  Administered 2017-08-03: 1 [IU] via SUBCUTANEOUS
  Administered 2017-08-04: 3 [IU] via SUBCUTANEOUS
  Administered 2017-08-04: 1 [IU] via SUBCUTANEOUS
  Administered 2017-08-05: 2 [IU] via SUBCUTANEOUS
  Administered 2017-08-05 – 2017-08-08 (×6): 1 [IU] via SUBCUTANEOUS
  Administered 2017-08-08 (×2): 2 [IU] via SUBCUTANEOUS
  Administered 2017-08-09: 1 [IU] via SUBCUTANEOUS
  Administered 2017-08-12: 2 [IU] via SUBCUTANEOUS
  Administered 2017-08-13: 1 [IU] via SUBCUTANEOUS

## 2017-07-31 MED ORDER — INSULIN ASPART 100 UNIT/ML ~~LOC~~ SOLN: 0.0000 [IU] | Freq: Every day | SUBCUTANEOUS | Status: DC

## 2017-07-31 NOTE — Progress Notes (Signed)
Occupational Therapy Session Note  Patient Details  Name: [name] MRN: [id number] Date of Birth: June 19, 1955  Today's Date: 07/31/2017 OT Individual Time: 3536-1443 OT Individual Time Calculation (min): 58 min     Short Term Goals: Week 1:  OT Short Term Goal 1 (Week 1): Pt will complete bathing with min assist OT Short Term Goal 2 (Week 1): Pt will complete LB dressing with min assist OT Short Term Goal 3 (Week 1): Pt will complete 2 grooming tasks in standing  OT Short Term Goal 4 (Week 1): Pt will utilize RUE as gross assist during self-care tasks with min cues  Skilled Therapeutic Interventions/Progress Updates:    Treatment session with focus on self-care retraining with bathing/dressing, functional transfers, and functional use of dominant RUE.  Pt received supine in bed stating that he did not sleep well over night but willing to engage in treatment session.  Completed bed mobility with supervision, mod assist stand pivot to w/c.  Engaged in bathing at sit > stand level in room shower with tactile cues for sitting and standing balance due to decreased attention and proprioception on Rt side of body.  Improved motor control with RUE with bathing this session.  Min cues for orientation of underwear as pt with decreased problem solving and increased frustration with errors.  Able to thread BLE in underwear and pants utilizing figure 4 position.  Decreased motor control in RUE with combing hair and increased difficulty with self-feeding with Rt hand.  Pt requested to return to bed at end of session, stand pivot transfer with min assist.  Left supine in bed with all needs in reach and bed alarm on.  Therapy Documentation Precautions:  Precautions Precautions: Fall Restrictions Weight Bearing Restrictions: No Vital Signs: Therapy Vitals Temp: (!) 97.4 F (36.3 C) Temp Source: Oral Pulse Rate: 95 Resp: 18 BP: 138/74 Patient Position (if appropriate): Lying Oxygen  Therapy SpO2: 96 % Pain:  Pt with no c/o pain  See Function Navigator for Current Functional Status.   Therapy/Group: Individual Therapy  [name] 07/31/2017, 8:01 AM
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
## 2017-07-31 NOTE — Progress Notes (Signed)
Physical Therapy Session Note  Patient Details  Name: [name] MRN: [id number] Date of Birth: 05/25/1956  Today's Date: 07/31/2017 PT Individual Time: 1000-1100 PT Individual Time Calculation (min): 60 min   Short Term Goals: Week 1:  PT Short Term Goal 1 (Week 1): pt will consistently perform functional transfers with mod A PT Short Term Goal 2 (Week 1): pt will gait in controlled environment x 50' with min A  Skilled Therapeutic Interventions/Progress Updates:    no c/o pain, but reporting fatigue.  Session focus on activity tolerance and safety awareness during functional mobility, and RUE motor control.    Pt takes increased time to transfer to EOB with supervision.  Min assist for sit<>stand throughout session, but mod assist for balance during transfers due to impulsivity.  Gait in gym with RW and mod assist x100' with cues for increased BOS and reduced toe in bilaterally.  Stair negotiation x4 steps with 2 rails, mod assist and max fade to mod cues for sequencing and one step at a time.  Side stepping L/R x20' at rail, forward/retro gait x20' at the rail with mod assist for retro gait.  Pt completed moderately complex peg board design with RUE for gross motor control and problem solving, with 1 instance where pt recognized error, but was unable to problem solve how to correct without mod question cues.  Pt returned to room at end of session and positioned in bed with call bell in reach, bed alarm activated, and needs met.   Therapy Documentation Precautions:  Precautions Precautions: Fall Restrictions Weight Bearing Restrictions: No   See Function Navigator for Current Functional Status.   Therapy/Group: Individual Therapy  Michel Santee 07/31/2017, 1:19 PM

## 2017-07-31 NOTE — Progress Notes (Signed)
[facility] PHYSICAL MEDICINE & REHABILITATION     PROGRESS NOTE  Subjective/Complaints:  Pt seen lying in bed this AM.  He slept well overnight.  He complains of constipation.   ROS: +Constipation. Denies CP, SOB, nausea, vomiting, diarrhea.  Objective: Vital Signs: Blood pressure 138/74, pulse 95, temperature (!) 97.4 F (36.3 C), temperature source Oral, resp. rate 18, height 5\' 7"  (1.702 m), weight 67.6 kg (149 lb 0.5 oz), SpO2 95 %. No results found. No results for input(s): WBC, HGB, HCT, PLT in the last 72 hours. Recent Labs    07/30/17 0802  NA 134*  K 4.7  CL 97*  GLUCOSE 128*  BUN 38*  CREATININE 1.09  CALCIUM 9.7   CBG (last 3)  Recent Labs    07/30/17 1617 07/30/17 2104 07/31/17 0641  GLUCAP 103* 250* 118*    Wt Readings from Last 3 Encounters:  07/27/17 67.6 kg (149 lb 0.5 oz)  07/22/17 66.8 kg (147 lb 4.3 oz)  06/28/17 68.9 kg (151 lb 12.8 oz)    Physical Exam:  BP 138/74 (BP Location: Left Arm)   Pulse 95   Temp (!) 97.4 F (36.3 C) (Oral)   Resp 18   Ht 5\' 7"  (1.702 m)   Wt 67.6 kg (149 lb 0.5 oz)   SpO2 95%   BMI 23.34 kg/m  Constitutional: He appearswell-developedand well-nourished. HENT: Normocephalicand atraumatic.  Eyes:EOMare normal. No discharge. Cardiovascular:RRR. No JVD. Respiratory:Effort normal and breath sounds normal.  GI: Bowel sounds are normal. He exhibitsno distension.  Musculoskeletal: He exhibits noedema, no tenderness.  Neurological: He isalertand oriented.  Motor: LUE/LLE: 4-4+/5 proximal to distal4 4/5 right delt bi tri grip (stable) 3+/5 right HF 3-/5 KE, 3+/5 R ankle DF Skin: Skin iswarmand dry. He isnot diaphoretic.  Psychiatric: His affect isblunt. He iswithdrawn. He expressesimpulsivityand inappropriate judgment.  Assessment/Plan: 1. Functional deficits secondary to left Basal ganglia and pontine infarcts which require 3+ hours per day of interdisciplinary therapy in a comprehensive  inpatient rehab setting. Physiatrist is providing close team supervision and 24 hour management of active medical problems listed below. Physiatrist and rehab team continue to assess barriers to discharge/monitor patient progress toward functional and medical goals.  Function:  Bathing Bathing position   Position: Shower  Bathing parts Body parts bathed by patient: Right arm, Chest, Abdomen, Front perineal area, Right upper leg, Left upper leg, Left arm, Buttocks Body parts bathed by helper: Right lower leg, Left lower leg, Back  Bathing assist Assist Level: Touching or steadying assistance(Pt > 75%)      Upper Body Dressing/Undressing Upper body dressing   What is the patient wearing?: Pull over shirt/dress     Pull over shirt/dress - Perfomed by patient: Thread/unthread right sleeve, Thread/unthread left sleeve, Put head through opening, Pull shirt over trunk Pull over shirt/dress - Perfomed by helper: Thread/unthread right sleeve        Upper body assist Assist Level: Touching or steadying assistance(Pt > 75%)      Lower Body Dressing/Undressing Lower body dressing   What is the patient wearing?: Underwear, Pants, Non-skid slipper socks Underwear - Performed by patient: Thread/unthread right underwear leg, Thread/unthread left underwear leg, Pull underwear up/down   Pants- Performed by patient: Thread/unthread right pants leg, Thread/unthread left pants leg, Pull pants up/down Pants- Performed by helper: Thread/unthread right pants leg, Pull pants up/down Non-skid slipper socks- Performed by patient: Don/doff right sock, Don/doff left sock Non-skid slipper socks- Performed by helper: Don/doff right sock, Don/doff left sock  Lower body assist Assist for lower body dressing: Touching or steadying assistance (Pt > 75%)      Toileting Toileting   Toileting steps completed by patient: Adjust clothing prior to toileting, Performs perineal hygiene, Adjust  clothing after toileting Toileting steps completed by helper: Adjust clothing prior to toileting, Performs perineal hygiene, Adjust clothing after toileting(per Candice Applewhite, NT) Toileting Assistive Devices: Grab bar or rail  Toileting assist Assist level: Two helpers   Transfers Chair/bed transfer     Chair/bed transfer assist level: Moderate assist (Pt 50 - 74%/lift or lower) Chair/bed transfer assistive device: Armrests     Locomotion Ambulation     Max distance: 25 Assist level: Moderate assist (Pt 50 - 74%)   Wheelchair     Max wheelchair distance: 40 Assist Level: Touching or steadying assistance (Pt > 75%)  Cognition Comprehension Comprehension assist level: Follows basic conversation/direction with no assist  Expression Expression assist level: Expresses basic needs/ideas: With no assist  Social Interaction Social Interaction assist level: Interacts appropriately 90% of the time - Needs monitoring or encouragement for participation or interaction.  Problem Solving Problem solving assist level: Solves basic 75 - 89% of the time/requires cueing 10 - 24% of the time  Memory Memory assist level: Recognizes or recalls 75 - 89% of the time/requires cueing 10 - 24% of the time    Medical Problem List and Plan: 1. 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 DVT Prophylaxis/Anticoagulation: Pharmaceutical:Lovenox 3. Pain Management:N/A 4. Mood:LCSW to follow for evaluation and support. 5. Neuropsych: This patientis not fullycapable of making decisions on hisown behalf.   Continue tele-sitter 6.Rash/Skin/Wound Care:Continue Triamcinolone cream tid. Monitor rash for healing. Maintain adequate nutritional and hydration status. 7. Fluids/Electrolytes/Nutrition:Monitor I/O.  8. HTN: Monitor BP bid. On Prinivil, indapamide,   Vitals:   07/31/17 0607 07/31/17 0849  BP: 138/74   Pulse: 95   Resp: 18   Temp:  (!) 97.4 F (36.3 C)   SpO2: 96% 95%    Labile on 3/5 9. CAD:On ASA and lipitor. 10. H/o Hep C:platelets stable. No meds 11. COPD/Lung CA s/p XRT: On Breo and Ellipta daily. 12. Depression: On Wellbutrin XL. 13. Constipation: Increased MiraLAX to twice a day    Increased bowel reg on 3/5 14. Itching/rash: Due to recent bed bug infestation. Added Sarna lotion. 15. Prediabetes   Cont to monitor   CBGs ordered   SSI ordered, will add qhs SSI   Diet changed to Carb modified   Labile on 3/5 16. Hyponatremia   Na 134 on 3/4   Cont to monitor 17. AKI   Cr 1.09 on 3/4   Encourage fluids  LOS (Days) 5 A FACE TO FACE EVALUATION WAS PERFORMED  Ankit [name] 07/31/2017 8:57 AM

## 2017-07-31 NOTE — Progress Notes (Signed)
Physical Therapy Note  Patient Details  Name: [name] MRN: [id number] Date of Birth: [birth year] Today's Date: 07/31/2017    Time: 1300-1345 45 minutes  1:1 No c/o pain, pt c/o extreme fatigue but willing to participate with increased time.  Pt performs bed mobility with supervision.  pt able to don shoes sitting edge of bed but gets easily frustrated with attempts to tie shoes, PT ties shoes for pt.  Pt attempts squat pivot transfer before w/c is locked and PT is in place, pt misses w/c and PT catches pt, pt attempts to push up into w/c but is unable.  PT advises pt to sit on the floor and then get up.  Pt able to perform floor transfer with min A.  RN made aware of near fall.  Pt propels w/c throughout unit with supervision.  Gait with RW 25' x 2 with min A, cues to decrease scissoring gait.  Stair negotiation 2 stairs with RW with min/mod A with mod cuing for technique and safety.  Pt then states his stomach hurts and requests to toilet.  Min A transfer to toilet with min A for pt to perform clothing management and min A for sitting balance with pt performing hygiene.  Pt then states he is too fatigued to continue therapy. Pt missed final 30 minutes of session due to fatigue.   [name] 07/31/2017, 2:00 PM

## 2017-08-01 ENCOUNTER — Inpatient Hospital Stay (HOSPITAL_COMMUNITY): Payer: Medicare HMO

## 2017-08-01 ENCOUNTER — Inpatient Hospital Stay (HOSPITAL_COMMUNITY): Payer: Medicare HMO | Admitting: Speech Pathology

## 2017-08-01 ENCOUNTER — Inpatient Hospital Stay (HOSPITAL_COMMUNITY): Payer: Medicare HMO | Admitting: *Deleted

## 2017-08-01 ENCOUNTER — Inpatient Hospital Stay (HOSPITAL_COMMUNITY): Payer: Medicare HMO | Admitting: Occupational Therapy

## 2017-08-01 DIAGNOSIS — R1084 Generalized abdominal pain: Secondary | ICD-10-CM

## 2017-08-01 DIAGNOSIS — R109 Unspecified abdominal pain: Secondary | ICD-10-CM

## 2017-08-01 LAB — URINALYSIS, COMPLETE (UACMP) WITH MICROSCOPIC
Bilirubin Urine: NEGATIVE
Glucose, UA: NEGATIVE mg/dL
Hgb urine dipstick: NEGATIVE
Ketones, ur: NEGATIVE mg/dL
Leukocytes, UA: NEGATIVE
Nitrite: NEGATIVE
PROTEIN: NEGATIVE mg/dL
Specific Gravity, Urine: 1.021 (ref 1.005–1.030)
Squamous Epithelial / LPF: NONE SEEN
pH: 6 (ref 5.0–8.0)

## 2017-08-01 LAB — GLUCOSE, CAPILLARY
GLUCOSE-CAPILLARY: 122 mg/dL — AB (ref 65–99)
GLUCOSE-CAPILLARY: 84 mg/dL (ref 65–99)
Glucose-Capillary: 127 mg/dL — ABNORMAL HIGH (ref 65–99)
Glucose-Capillary: 143 mg/dL — ABNORMAL HIGH (ref 65–99)

## 2017-08-01 MED ORDER — SUCRALFATE 1 GM/10ML PO SUSP
1.0000 g | Freq: Three times a day (TID) | ORAL | Status: DC
  Administered 2017-08-01 – 2017-08-14 (×52): 1 g via ORAL
  Filled 2017-08-01 (×53): qty 10

## 2017-08-01 MED ORDER — SENNOSIDES-DOCUSATE SODIUM 8.6-50 MG PO TABS
2.0000 | ORAL_TABLET | Freq: Two times a day (BID) | ORAL | Status: DC
  Administered 2017-08-01 – 2017-08-13 (×24): 2 via ORAL
  Filled 2017-08-01 (×24): qty 2

## 2017-08-01 MED ORDER — SODIUM CHLORIDE 0.9 % IV SOLN
INTRAVENOUS | Status: DC
  Administered 2017-08-01 – 2017-08-02 (×2): via INTRAVENOUS

## 2017-08-01 NOTE — Progress Notes (Signed)
Patient refuses for staff to stay in room or stand by while he urinates.

## 2017-08-01 NOTE — Progress Notes (Signed)
Physical Therapy Note  Patient Details  Name: [name] MRN: [id number] Date of Birth: 11-Jun-1955 Today's Date: 08/01/2017    Pt refused 60 minute PT session due to c/o fatigue from using toilet and vomiting all night/morning. PT and Rec therapist encouraged participation but pt continues to refuse.   DONAWERTH,KAREN 08/01/2017, 10:12 AM

## 2017-08-01 NOTE — Progress Notes (Signed)
Speech Language Pathology Daily Session Note  Patient Details  Name: [name] MRN: [id number] Date of Birth: [birth year]  Today's Date: 08/01/2017 SLP Individual Time: 1505-1530 SLP Individual Time Calculation (min): 25 min  Short Term Goals: Week 1: SLP Short Term Goal 1 (Week 1): Pt will utilize external memory aids to facilitate recall of daily information with supervision.  SLP Short Term Goal 2 (Week 1): Pt will return demonstration of at least 2 safety precautions during functional tasks with supervision.  SLP Short Term Goal 3 (Week 1): Pt will complete familiar tasks with supervision cues for functional problem solving.   Skilled Therapeutic Interventions:  Pt was seen for skilled ST targeting cognitive goals and pt/family education.  Surprisingly, pt was sitting up in bed and agreeable to participating in therapy.  He stated that he had been nauseated and sick all morning but was beginning to feel better.  Pt's sister was present for the duration of today's therapy session.  SLP discussed current goals and progress in therapy.  Pt was able to recall at least 1 safety recommendation from PT/OT, specifically slowing down and not rushing through tasks.  Pt verbalized being upset at team's recommendation for 24/7 supervision at discharge.  SLP explained rationale behind recommendations and discussed possible solutions for supervision with pt and his sister.  At this time, pt states that there is no one else who can assist him at home and that his sister has to work.  Will continue working towards safe discharge plan.    Function:  Eating Eating                 Cognition Comprehension Comprehension assist level: Follows basic conversation/direction with no assist  Expression   Expression assist level: Expresses basic needs/ideas: With no assist  Social Interaction Social Interaction assist level: Interacts appropriately 90% of the time - Needs monitoring or encouragement for  participation or interaction.  Problem Solving Problem solving assist level: Solves basic 75 - 89% of the time/requires cueing 10 - 24% of the time  Memory Memory assist level: Recognizes or recalls 75 - 89% of the time/requires cueing 10 - 24% of the time    Pain Pain Assessment Pain Assessment: No/denies pain  Therapy/Group: Individual Therapy  Deeanna Beightol, Selinda Orion 08/01/2017, 4:13 PM

## 2017-08-01 NOTE — Progress Notes (Signed)
Occupational Therapy Session Note  Patient Details  Name: [name] MRN: [id number] Date of Birth: 22-Mar-1956  Today's Date: 08/01/2017 OT Individual Time: 0900-0930 OT Individual Time Calculation (min): 30 min    Short Term Goals: Week 1:  OT Short Term Goal 1 (Week 1): Pt will complete bathing with min assist OT Short Term Goal 2 (Week 1): Pt will complete LB dressing with min assist OT Short Term Goal 3 (Week 1): Pt will complete 2 grooming tasks in standing  OT Short Term Goal 4 (Week 1): Pt will utilize RUE as gross assist during self-care tasks with min cues  Skilled Therapeutic Interventions/Progress Updates:    Treatment session with focus on functional transfers, standing balance, and safety awareness.  Pt missed initial 30 mins due to stomach pain, has since received suppository, and then on toilet with RN upon therapist arrival.  Engaged in lateral leans for hygiene, pt requiring min assist for sitting balance due to decreased awareness and impulsivity.  Required assist from therapist for hygiene for thoroughness.  Pt completed clothing management with min assist for standing balance.  Completed hand hygiene in standing at sink with min assist for standing balance.  Pt returned to bed at end of session due to fatigue and continued c/o stomach pain.  Therapy Documentation Precautions:  Precautions Precautions: Fall Restrictions Weight Bearing Restrictions: No General: General OT Amount of Missed Time: 30 Minutes PT Missed Treatment Reason: Patient fatigue;Patient ill (Comment) Vital Signs: Oxygen Therapy SpO2: 96 % O2 Device: Room Air Pain:  Pt with c/o pain in abdomen, RN aware  See Function Navigator for Current Functional Status.   Therapy/Group: Individual Therapy  [name] 08/01/2017, 10:56 AM

## 2017-08-01 NOTE — Progress Notes (Signed)
Recreational Therapy Session Note  Patient Details  Name: [name] MRN: [id number] Date of Birth: Sep 11, 1955 Today's Date: 08/01/2017  Pain: c/o stomach discomfort, RN aware Skilled Therapeutic Interventions/Progress Updates: Attempted TR session but pt declined due to nausea, vomiting & bowel movements.     [place] 08/01/2017, 2:58 PM

## 2017-08-01 NOTE — Progress Notes (Signed)
Patient asked staff to leave the room while he used the urinal on the side of the bed. He was informed of the falls policy but was adamant that staff leaves the room & he could not urinate with someone watching him. He refused. Staff stood behind the curtain until he signalled that he was done.

## 2017-08-01 NOTE — Progress Notes (Signed)
Occupational Therapy Note  Patient Details  Name: [name] MRN: [id number] Date of Birth: 1955-10-04  Today's Date: 08/01/2017 OT Missed Time: 21 Minutes Missed Time Reason: Patient ill (comment)  Pt missed 30 scheduled OT treatment session secondary to reports of feeling "awful".  Pt reports stomach hurting, hot flashes/sweating, and having vomited this AM (confirmed per RN).  Notified RN and MD about pt complaints.  Will attempt to return later in day and continue to follow as schedule allows.  Pain:  Pt with c/o pain in stomach and feeling sick.  Notified RN and MD  [name] 08/01/2017, 7:53 AM

## 2017-08-01 NOTE — Progress Notes (Signed)
[place] PHYSICAL MEDICINE & REHABILITATION     PROGRESS NOTE  Subjective/Complaints:  Patient seen lying in bed this morning. He complains of abdominal pain notes that he does not feel well and that he vomited earlier this morning. He notes he has not had a good bowel movement in a significant period of time. Discussed with nursing.  ROS: + Abdominal pain, vomiting. Denies CP, SOB, diarrhea.  Objective: Vital Signs: Blood pressure 122/69, pulse 92, temperature 97.7 F (36.5 C), temperature source Oral, resp. rate 18, height 5\' 7"  (1.702 m), weight 67.6 kg (149 lb 0.5 oz), SpO2 96 %. No results found. No results for input(s): WBC, HGB, HCT, PLT in the last 72 hours. Recent Labs    07/30/17 0802  NA 134*  K 4.7  CL 97*  GLUCOSE 128*  BUN 38*  CREATININE 1.09  CALCIUM 9.7   CBG (last 3)  Recent Labs    07/31/17 1635 07/31/17 2100 08/01/17 0638  GLUCAP 107* 102* 127*    Wt Readings from Last 3 Encounters:  07/27/17 67.6 kg (149 lb 0.5 oz)  07/22/17 66.8 kg (147 lb 4.3 oz)  06/28/17 68.9 kg (151 lb 12.8 oz)    Physical Exam:  BP 122/69 (BP Location: Right Arm)   Pulse 92   Temp 97.7 F (36.5 C) (Oral)   Resp 18   Ht 5\' 7"  (1.702 m)   Wt 67.6 kg (149 lb 0.5 oz)   SpO2 96%   BMI 23.34 kg/m  Constitutional: He appearswell-developedand well-nourished. HENT: Normocephalicand atraumatic.  Eyes:EOMare normal. No discharge. Cardiovascular:RRR. No JVD. Respiratory:Effort normal and breath sounds normal.  GI: Bowel sounds are slowed. + Distention.  Musculoskeletal: He exhibits noedema, no tenderness.  Neurological: He isalertand oriented.  Motor: LUE/LLE: 4-4+/5 proximal to distal4 4/5 right delt bi tri grip (stable) 3+/5 right HF 3-/5 KE, 3+/5 R ankle DF (improving)  Skin: Skin iswarmand dry. He isnot diaphoretic.  Psychiatric: His affect isblunt. He iswithdrawn. He expressesimpulsivityand inappropriate judgment.  Assessment/Plan: 1.  Functional deficits secondary to left Basal ganglia and pontine infarcts which require 3+ hours per day of interdisciplinary therapy in a comprehensive inpatient rehab setting. Physiatrist is providing close team supervision and 24 hour management of active medical problems listed below. Physiatrist and rehab team continue to assess barriers to discharge/monitor patient progress toward functional and medical goals.  Function:  Bathing Bathing position   Position: Shower  Bathing parts Body parts bathed by patient: Right arm, Chest, Abdomen, Front perineal area, Right upper leg, Left upper leg, Left arm, Buttocks Body parts bathed by helper: Right lower leg, Left lower leg, Back  Bathing assist Assist Level: Touching or steadying assistance(Pt > 75%)      Upper Body Dressing/Undressing Upper body dressing   What is the patient wearing?: Pull over shirt/dress     Pull over shirt/dress - Perfomed by patient: Thread/unthread right sleeve, Thread/unthread left sleeve, Put head through opening, Pull shirt over trunk Pull over shirt/dress - Perfomed by helper: Thread/unthread right sleeve        Upper body assist Assist Level: Touching or steadying assistance(Pt > 75%)      Lower Body Dressing/Undressing Lower body dressing   What is the patient wearing?: Underwear, Pants, Non-skid slipper socks Underwear - Performed by patient: Thread/unthread right underwear leg, Thread/unthread left underwear leg, Pull underwear up/down   Pants- Performed by patient: Thread/unthread right pants leg, Thread/unthread left pants leg, Pull pants up/down Pants- Performed by helper: Thread/unthread right pants leg, Pull  pants up/down Non-skid slipper socks- Performed by patient: Don/doff right sock, Don/doff left sock Non-skid slipper socks- Performed by helper: Don/doff right sock, Don/doff left sock                  Lower body assist Assist for lower body dressing: Touching or steadying assistance  (Pt > 75%)      Toileting Toileting   Toileting steps completed by patient: Adjust clothing prior to toileting, Performs perineal hygiene, Adjust clothing after toileting Toileting steps completed by helper: Adjust clothing prior to toileting, Performs perineal hygiene, Adjust clothing after toileting(per Candice Applewhite, NT) Toileting Assistive Devices: Grab bar or rail  Toileting assist Assist level: Two helpers   Transfers Chair/bed transfer   Chair/bed transfer method: Stand pivot Chair/bed transfer assist level: Moderate assist (Pt 50 - 74%/lift or lower) Chair/bed transfer assistive device: Armrests     Locomotion Ambulation     Max distance: 100 Assist level: Moderate assist (Pt 50 - 74%)   Wheelchair     Max wheelchair distance: 40 Assist Level: Touching or steadying assistance (Pt > 75%)  Cognition Comprehension Comprehension assist level: Follows basic conversation/direction with no assist  Expression Expression assist level: Expresses basic needs/ideas: With no assist  Social Interaction Social Interaction assist level: Interacts appropriately 90% of the time - Needs monitoring or encouragement for participation or interaction.  Problem Solving Problem solving assist level: Solves basic 75 - 89% of the time/requires cueing 10 - 24% of the time  Memory Memory assist level: Recognizes or recalls 75 - 89% of the time/requires cueing 10 - 24% of the time    Medical Problem List and Plan: 1. 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 DVT Prophylaxis/Anticoagulation: Pharmaceutical:Lovenox 3. Pain Management:N/A 4. Mood:LCSW to follow for evaluation and support. 5. Neuropsych: This patientis not fullycapable of making decisions on hisown behalf.   Continue tele-sitter 6.Rash/Skin/Wound Care:Continue Triamcinolone cream tid. Monitor rash for healing. Maintain adequate nutritional and hydration  status. 7. Fluids/Electrolytes/Nutrition:Monitor I/O.  8. HTN: Monitor BP bid. On Prinivil, indapamide,   Vitals:   08/01/17 0509 08/01/17 0743  BP: 122/69   Pulse: 92   Resp: 18   Temp: 97.7 F (36.5 C)   SpO2: 96% 96%    Relatively controlled on 3/6 9. CAD:On ASA and lipitor. 10. H/o Hep C:No meds 11. COPD/Lung CA s/p XRT: On Breo and Ellipta daily. 12. Depression: On Wellbutrin XL. 13. Constipation: Increased MiraLAX to twice a day    Increased bowel reg on 3/5, again on 3/6   KUB ordered 14. Itching/rash: Due to recent bed bug infestation. Added Sarna lotion. 15. Prediabetes   Cont to monitor   CBGs ordered   SSI ordered, will add qhs SSI   Diet changed to Carb modified   Stabilizing, will consider oral medications tomorrow 16. Hyponatremia   Na 134 on 3/4   Cont to monitor 17. AKI   Cr 1.09 on 3/4   Encourage fluids  LOS (Days) 6 A FACE TO FACE EVALUATION WAS PERFORMED  Ankit [name] 08/01/2017 8:54 AM

## 2017-08-01 NOTE — Progress Notes (Signed)
Occupational Therapy Session Note  Patient Details  Name: [name] MRN: [id number] Date of Birth: Oct 08, 1955  Today's Date: 08/01/2017 Missed therapy minutes: 60 min due to pt feeling ill  Short Term Goals: Week 1:  OT Short Term Goal 1 (Week 1): Pt will complete bathing with min assist OT Short Term Goal 2 (Week 1): Pt will complete LB dressing with min assist OT Short Term Goal 3 (Week 1): Pt will complete 2 grooming tasks in standing  OT Short Term Goal 4 (Week 1): Pt will utilize RUE as gross assist during self-care tasks with min cues  Skilled Therapeutic Interventions/Progress Updates:    Pt received in bed and spoke with pt for a few minutes.  He requested to rest and resume therapy tomorrow as he was feeling so poorly from N/V earlier in the day.  Therapy Documentation Precautions:  Precautions Precautions: Fall Restrictions Weight Bearing Restrictions: No  General OT Amount of Missed Time: 67 Minutes PT Missed Treatment Reason: Patient fatigue;Patient ill (Comment)   ADL:    See Function Navigator for Current Functional Status.   Therapy/Group: Individual Therapy  [place] 08/01/2017, 12:58 PM

## 2017-08-02 ENCOUNTER — Inpatient Hospital Stay (HOSPITAL_COMMUNITY): Payer: Medicare HMO | Admitting: Occupational Therapy

## 2017-08-02 ENCOUNTER — Inpatient Hospital Stay (HOSPITAL_COMMUNITY): Payer: Medicare HMO | Admitting: Physical Therapy

## 2017-08-02 LAB — BASIC METABOLIC PANEL
ANION GAP: 10 (ref 5–15)
BUN: 28 mg/dL — ABNORMAL HIGH (ref 6–20)
CALCIUM: 9.2 mg/dL (ref 8.9–10.3)
CO2: 24 mmol/L (ref 22–32)
Chloride: 101 mmol/L (ref 101–111)
Creatinine, Ser: 0.92 mg/dL (ref 0.61–1.24)
Glucose, Bld: 108 mg/dL — ABNORMAL HIGH (ref 65–99)
POTASSIUM: 4.2 mmol/L (ref 3.5–5.1)
Sodium: 135 mmol/L (ref 135–145)

## 2017-08-02 LAB — GLUCOSE, CAPILLARY
GLUCOSE-CAPILLARY: 99 mg/dL (ref 65–99)
GLUCOSE-CAPILLARY: 148 mg/dL — AB (ref 65–99)
GLUCOSE-CAPILLARY: 119 mg/dL — AB (ref 65–99)
GLUCOSE-CAPILLARY: 102 mg/dL — AB (ref 65–99)

## 2017-08-02 LAB — CBC
HEMATOCRIT: 42.6 % (ref 39.0–52.0)
HEMOGLOBIN: 14.4 g/dL (ref 13.0–17.0)
MCH: 31.5 pg (ref 26.0–34.0)
MCHC: 33.8 g/dL (ref 30.0–36.0)
MCV: 93.2 fL (ref 78.0–100.0)
Platelets: 274 10*3/uL (ref 150–400)
RBC: 4.57 MIL/uL (ref 4.22–5.81)
RDW: 13.3 % (ref 11.5–15.5)
WBC: 8.3 10*3/uL (ref 4.0–10.5)

## 2017-08-02 NOTE — Progress Notes (Signed)
Physical Therapy Session Note  Patient Details  Name: [name] MRN: [id number] Date of Birth: February 13, 1956  Today's Date: 08/02/2017 PT Individual Time: 1115-1200 PT Individual Time Calculation (min): 45 min   Short Term Goals: Week 1:  PT Short Term Goal 1 (Week 1): pt will consistently perform functional transfers with mod A PT Short Term Goal 2 (Week 1): pt will gait in controlled environment x 50' with min A  Skilled Therapeutic Interventions/Progress Updates:    Pt supine in bed, agreeable to participate in therapy session. Pt reports no pain this date. Bed mobility SBA with v/c for safety. Attempt to have pt don shoes, pt becomes unsafe and almost falls off the bed, requires min assist to correct balance. Assisted pt with donning shoes to save time. Stand pivot transfer bed to w/c with min assist. Pt is impulsive and requires verbal cues for safety with transfers and mobility. Manual w/c propulsion 2 x 150 ft with SBA, verbal cues to attend to obstacles on R side. Side-steps in // bars with focus on upright posture, amb fwd/back in // bars with BUE support and min assist, v/c to perform slowly and safely and visual cues to widen BOS. SLS on R leg 4 x 30 sec each with LUE support and mod assist, focus on maintaining balance and decreasing retropulsion in standing. Standing BLE coordination: colored dot taps alt L/R, alt L/R cone taps with mod assist and BUE support for balance. Pt left seated in w/c in room set up for lunch, needs in reach and QRB in place.  Therapy Documentation Precautions:  Precautions Precautions: Fall Restrictions Weight Bearing Restrictions: No  See Function Navigator for Current Functional Status.   Therapy/Group: Individual Therapy  Excell Seltzer, PT, DPT  08/02/2017, 4:24 PM

## 2017-08-02 NOTE — Progress Notes (Signed)
Occupational Therapy Note  Patient Details  Name: [name] MRN: [id number] Date of Birth: 11-26-1955  Today's Date: 08/02/2017 OT Individual Time: 8182-9937 OT Individual Time Calculation (min): 20 min   No c/o pain  Pt seen this session to focus on dynamic sit and stand balance and RUE coordination. Pt received in bed stating he felt much better today and was eager to do therapy.  Pt sat to EOB with S.  He worked on reaching laterally and initially did not have any pelvic shift or trunk elongation and would "tip" to the side. With visual and tactile cues, pt responded well to cues and then was able to wt shift during the extended reach. Pt worked on sit to stand with mod posterior lean, but then was able to achieve static stand with touching A without lean with cues.  Dynamic balance with marching in place.  RUE overhead reaching.  Discussed pt's concerns with the recommendation for 24/7 S as his sister works.  Discussed the need for pt to focus on his safety awareness and balance to decrease the need for full S in the future. Pt resting in bed with bed alarm set and all needs met.   [place] 08/02/2017, 12:09 PM

## 2017-08-02 NOTE — Progress Notes (Signed)
Patient complained of acid indigestion. Offered Maalox but patient refused. He made no further complaints.

## 2017-08-02 NOTE — Progress Notes (Signed)
Physical Therapy Session Note  Patient Details  Name: [name] MRN: [id number] Date of Birth: 11-12-1955  Today's Date: 08/02/2017 PT Individual Time: 1330-1430 PT Individual Time Calculation (min): 60 min   Short Term Goals: Week 1:  PT Short Term Goal 1 (Week 1): pt will consistently perform functional transfers with mod A PT Short Term Goal 2 (Week 1): pt will gait in controlled environment x 50' with min A  Skilled Therapeutic Interventions/Progress Updates:    Pt supine in bed, agreeable to participate in therapy session. Pt reports upset stomach following lunch, pain is not rated and pt agreeable to participate in therapy session. Bed mobility SBA. Stand pivot transfer bed to w/c with min assist and v/c for safety. Manual w/c propulsion 2 x 150 ft with SBA and v/c to attend to obstacles on R side. Ascend/descend 12 stairs with 2 handrails and Mod Assist, v/c for step-to gait pattern. Ambulation 2 x 50 ft with RW and Mod Assist, visual cues to increase BOS and decrease scissoring. Nustep level 5 x 5 min, lvl 3 x 5 min with B UE/LE. Pt becomes impulsive when attempting to transfer from Nustep to w/c and requires assist to not attempt to transfer himself and that needs assistance with all mobility. Pt exhibits poor insight into his deficits and states he believes he would be safe to go home today despite balance and coordination he continues to exhibit at this time. Pt educated on his deficits and that he is not safe to transfer or ambulate on his own at this time. Pt left supine in bed with needs in reach and bed alarm in place.  Therapy Documentation Precautions:  Precautions Precautions: Fall Restrictions Weight Bearing Restrictions: No  See Function Navigator for Current Functional Status.   Therapy/Group: Individual Therapy   Excell Seltzer, PT, DPT  08/02/2017, 4:30 PM

## 2017-08-02 NOTE — Progress Notes (Signed)
[facility] PHYSICAL MEDICINE & REHABILITATION     PROGRESS NOTE  Subjective/Complaints:  Patient seen lying in bed this morning. He states that he did not sleep well overnight because he was hungry. He does not like his current diet. We'll see regular food again. He denies abdominal complaints. No recent anti-emetics required per nursing.  ROS: Denies CP, SOB, nausea, vomiting, diarrhea.  Objective: Vital Signs: Blood pressure 116/72, pulse 95, temperature 97.6 F (36.4 C), temperature source Oral, resp. rate 16, height 5\' 7"  (1.702 m), weight 67.6 kg (149 lb 0.5 oz), SpO2 97 %. Dg Abd 1 View  Result Date: 08/01/2017 CLINICAL DATA:  Abdominal pain, constipation. EXAM: Supine abdominal radiograph. COMPARISON:  PET-CT study dated November 23, 2009. FINDINGS: The bowel gas pattern is normal. The stool burden is not excessive. The bony structures are unremarkable. There are linear calcifications in the upper abdomen to the left of midline which are of uncertain etiology. These may be vascular in nature. There are vascular calcifications in the iliac arteries bilaterally. There are surgical clips in the gallbladder fossa. IMPRESSION: Normal colonic stool burden. No definite acute intra-abdominal abnormality. Electronically Signed   By: David  Martinique M.D.   On: 08/01/2017 12:59   Recent Labs    08/02/17 0550  WBC 8.3  HGB 14.4  HCT 42.6  PLT 274   Recent Labs    08/02/17 0550  NA 135  K 4.2  CL 101  GLUCOSE 108*  BUN 28*  CREATININE 0.92  CALCIUM 9.2   CBG (last 3)  Recent Labs    08/01/17 1635 08/01/17 2111 08/02/17 0629  GLUCAP 122* 84 99    Wt Readings from Last 3 Encounters:  07/27/17 67.6 kg (149 lb 0.5 oz)  07/22/17 66.8 kg (147 lb 4.3 oz)  06/28/17 68.9 kg (151 lb 12.8 oz)    Physical Exam:  BP 116/72 (BP Location: Left Arm)   Pulse 95   Temp 97.6 F (36.4 C) (Oral)   Resp 16   Ht 5\' 7"  (1.702 m)   Wt 67.6 kg (149 lb 0.5 oz)   SpO2 97%   BMI 23.34 kg/m   Constitutional: He appearswell-developedand well-nourished. HENT: Normocephalicand atraumatic.  Eyes:EOMare normal. No discharge. Cardiovascular:RRR. No JVD. Respiratory:Effort normal and breath sounds normal.  GI: Bowel sounds are normal. Nondistended.  Musculoskeletal: He exhibits noedema, no tenderness.  Neurological: He isalertand oriented.  Motor: LUE/LLE: 4+/5 proximal to distal 4/5 right delt bi tri grip (stable) 4/5 right HF 4/5 KE, 4+/5 R ankle DF  Skin: Skin iswarmand dry. He isnot diaphoretic.  Psychiatric: His affect isblunt. He iswithdrawn. He expressesimpulsivityand inappropriate judgment.  Assessment/Plan: 1. Functional deficits secondary to left Basal ganglia and pontine infarcts which require 3+ hours per day of interdisciplinary therapy in a comprehensive inpatient rehab setting. Physiatrist is providing close team supervision and 24 hour management of active medical problems listed below. Physiatrist and rehab team continue to assess barriers to discharge/monitor patient progress toward functional and medical goals.  Function:  Bathing Bathing position   Position: Shower  Bathing parts Body parts bathed by patient: Right arm, Chest, Abdomen, Front perineal area, Right upper leg, Left upper leg, Left arm, Buttocks Body parts bathed by helper: Right lower leg, Left lower leg, Back  Bathing assist Assist Level: Touching or steadying assistance(Pt > 75%)      Upper Body Dressing/Undressing Upper body dressing   What is the patient wearing?: Pull over shirt/dress     Pull over shirt/dress - Perfomed  by patient: Thread/unthread right sleeve, Thread/unthread left sleeve, Put head through opening, Pull shirt over trunk Pull over shirt/dress - Perfomed by helper: Thread/unthread right sleeve        Upper body assist Assist Level: Touching or steadying assistance(Pt > 75%)      Lower Body Dressing/Undressing Lower body dressing   What is the  patient wearing?: Underwear, Pants, Non-skid slipper socks Underwear - Performed by patient: Thread/unthread right underwear leg, Thread/unthread left underwear leg, Pull underwear up/down   Pants- Performed by patient: Thread/unthread right pants leg, Thread/unthread left pants leg, Pull pants up/down Pants- Performed by helper: Thread/unthread right pants leg, Pull pants up/down Non-skid slipper socks- Performed by patient: Don/doff right sock, Don/doff left sock Non-skid slipper socks- Performed by helper: Don/doff right sock, Don/doff left sock                  Lower body assist Assist for lower body dressing: Touching or steadying assistance (Pt > 75%)      Toileting Toileting   Toileting steps completed by patient: Adjust clothing prior to toileting Toileting steps completed by helper: Performs perineal hygiene Toileting Assistive Devices: Grab bar or rail  Toileting assist Assist level: Two helpers   Transfers Chair/bed transfer   Chair/bed transfer method: Stand pivot Chair/bed transfer assist level: Touching or steadying assistance (Pt > 75%) Chair/bed transfer assistive device: Armrests     Locomotion Ambulation     Max distance: 100 Assist level: Moderate assist (Pt 50 - 74%)   Wheelchair     Max wheelchair distance: 40 Assist Level: Touching or steadying assistance (Pt > 75%)  Cognition Comprehension Comprehension assist level: Follows basic conversation/direction with no assist  Expression Expression assist level: Expresses basic needs/ideas: With no assist  Social Interaction Social Interaction assist level: Interacts appropriately 90% of the time - Needs monitoring or encouragement for participation or interaction.  Problem Solving Problem solving assist level: Solves basic 75 - 89% of the time/requires cueing 10 - 24% of the time  Memory Memory assist level: Recognizes or recalls 75 - 89% of the time/requires cueing 10 - 24% of the time    Medical  Problem List and Plan: 1. 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 DVT Prophylaxis/Anticoagulation: Pharmaceutical:Lovenox 3. Pain Management:N/A 4. Mood:LCSW to follow for evaluation and support. 5. Neuropsych: This patientis not fullycapable of making decisions on hisown behalf.   Continue tele-sitter 6.Rash/Skin/Wound Care:Continue Triamcinolone cream tid. Monitor rash for healing. Maintain adequate nutritional and hydration status. 7. Fluids/Electrolytes/Nutrition:Monitor I/O.  8. HTN: Monitor BP bid. On Prinivil, indapamide,   Vitals:   08/01/17 1453 08/02/17 0500  BP: (!) 146/86 116/72  Pulse: 91 95  Resp: 16 16  Temp: (!) 97.5 F (36.4 C) 97.6 F (36.4 C)  SpO2: 98% 97%    Relatively controlled on 3/6 9. CAD:On ASA and lipitor. 10. H/o Hep C:No meds 11. COPD/Lung CA s/p XRT: On Breo and Ellipta daily. 12. Depression: On Wellbutrin XL. 13. Constipation: Increased MiraLAX to twice a day    Increased bowel reg on 3/5, again on 3/6   KUB reviewed, relatively unremarkable, but was taken and patient had a good bowel movement.   UA/U culture relatively unremarkable 14. Itching/rash: Due to recent bed bug infestation. Added Sarna lotion. 15. Prediabetes   Cont to monitor   CBGs ordered   SSI ordered, added qhs SSI   Diet changed to Carb modified   Improving  on 3/7 16. Hyponatremia   Na 135 on 3/7   Cont to monitor 17. AKI   Cr 0.92 on 3/7 after IVF   Encourage fluids   Continue to monitor   IVF [place] on 3/7  LOS (Days) 7 A FACE TO FACE EVALUATION WAS PERFORMED  Tru Rana [name] 08/02/2017 9:06 AM

## 2017-08-02 NOTE — Progress Notes (Signed)
Occupational Therapy Session Note  Patient Details  Name: [name] MRN: [id number] Date of Birth: May 27, 1956  Today's Date: 08/02/2017 OT Individual Time: 0930-1030 OT Individual Time Calculation (min): 60 min    Short Term Goals: Week 1:  OT Short Term Goal 1 (Week 1): Pt will complete bathing with min assist OT Short Term Goal 2 (Week 1): Pt will complete LB dressing with min assist OT Short Term Goal 3 (Week 1): Pt will complete 2 grooming tasks in standing  OT Short Term Goal 4 (Week 1): Pt will utilize RUE as gross assist during self-care tasks with min cues  Skilled Therapeutic Interventions/Progress Updates:    Treatment session with focus on functional transfers, dynamic standing balance, and functional use of dominant RUE during self-care tasks.  Pt received supine in bed, more alert and reports feeling better this AM.  Completed bed mobility with supervision and stand pivot transfer to w/c with min assist.  Cues provided to slow down with transitional movements to increase safety.  Engaged in bathing at sit > stand level in room shower.  Provided min guard during sitting due to impulsivity and decreased sitting balance as well as min assist when standing to wash buttocks.  Pt continues to demonstrate decreased coordination with RUE during bathing, dressing, and grooming tasks.  Engaged in grooming tasks in standing with focus on standing balance and functional use of RUE with pt switching to Lt hand when brushing teeth.  Engaged in [place] with [place] with gross and fine motor control utilizing foam pieces with in-hand manipulation, translation, pinch, and reaching.  Returned to supine and left with all needs in reach.  Therapy Documentation Precautions:  Precautions Precautions: Fall Restrictions Weight Bearing Restrictions: No Pain:  Pt with no c/o pain  See Function Navigator for Current Functional Status.   Therapy/Group: Individual Therapy  [name] 08/02/2017,  12:17 PM

## 2017-08-02 NOTE — Patient Care Conference (Signed)
Inpatient RehabilitationTeam Conference and Plan of Care Update Date: 08/01/2017   Time: 2:45 PM    Patient Name: [name]      Medical Record Number: [id number]  Date of Birth: 05-17-1956 Sex: Male         Room/Bed: 4M12C/4M12C-01 Payor Info: Payor: HU[employer] MEDICARE / Plan: [employer] MEDICARE HMO / Product Type: *No Product type* /    Admitting Diagnosis: L CVA   Admit Date/Time:  07/26/2017  5:18 PM Admission Comments: No comment available   Primary Diagnosis:  <principal problem not specified> Principal Problem: <principal problem not specified>  Patient Active Problem List   Diagnosis Date Noted  . Abdominal pain   . AKI (acute kidney injury) ([place])   . Hyponatremia   . Depression   . Constipation   . Infarction of left basal ganglia ([place]) 07/26/2017  . Brainstem infarction   . 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 Gait disturbance, post-stroke   . [employer] (NIH) Stroke Scale limb ataxia score 2, ataxia present in two limbs   . Coronary artery disease involving native coronary artery of native heart without angina pectoris   . Hepatitis C virus infection without hepatic coma   . Chronic obstructive pulmonary disease ([place])   . Benign essential HTN   . Prediabetes   . Tobacco abuse   . Cerebral thrombosis with cerebral infarction 07/23/2017  . Agitation   . History of alcohol abuse   . Altered mental status, unspecified 07/22/2017  . Ataxia   . Weakness   . Anemia, iron deficiency 04/04/2017  . 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 Decreased hearing of right ear 02/16/2016  . Peripheral arterial disease ([place])   . GERD (gastroesophageal reflux disease)   . Hepatitis C, chronic ([place]) 06/16/2015  . Major depressive disorder, single episode, moderate ([place]) 04/09/2015  . Unspecified hereditary and idiopathic peripheral neuropathy 10/17/2013  . Urinary incontinence 12/26/2012  . Erectile  dysfunction 03/07/2012  . Loss of weight 06/30/2011  . COPD exacerbation ([place]) 05/19/2011  . History of CVA (cerebrovascular accident) 12/28/2010  . Hyperlipidemia 12/28/2010  . Allergic rhinitis 09/09/2010  . MIGRAINE HEADACHE 01/11/2010  . Smoker 09/10/2009  . LOW BACK PAIN 11/27/2008  . COLONIC POLYPS, ADENOMATOUS, HX OF 02/24/2008  . COPD, moderate ([place]) 08/13/2006  . INSOMNIA NOS 07/26/2006    Expected Discharge Date: Expected Discharge Date: 08/16/17  Team Members Present: Physician leading conference: Dr. Delice Lesch Social Worker Present: Lennart Pall, LCSW Nurse Present: Rayetta Humphrey, RN PT Present: Roderic Ovens, PT OT Present: [name], OT SLP Present: Windell Moulding, SLP PPS Coordinator present : Daiva Nakayama, RN, CRRN     Current Status/Progress Goal Weekly Team Focus  Medical   Right hemiparesis and ataxia secondary to left Basal ganglia and pontine infarcts  Improve mobility, cognition, prediabetes, constipation, AKI  See above   Bowel/Bladder   cont of B/B; LBM 3/5; urinal  maintain  assess q shift and prn   Swallow/Nutrition/ Hydration             ADL's   Mod assist transfers, Min assist dressing, decreased safety awareness/impulsive, RUE decreased cooridnation/proprioception  Supervision overall  safety awareness, ADL retraining, RUE NMR   Mobility   min/mod A gait and transfers, decreased safety awareness, impulsive, decreased coordination  supervision overall  awareness, balance, gait, NMR   Communication             Safety/Cognition/ Behavioral Observations  decreased safety awareness, otherwise suspect pt to be at baseline  supervision   reinforce education regarding safety precautions   Pain   denies pain  maintain  assess q shift and prn   Skin   old insect bite sites to BLE  no c/o itching/irritation  admin sarna and triamcinolone; assess q shift and prn    Rehab Goals Patient on target to meet rehab goals: Yes *See Care Plan and progress  notes for long and short-term goals.     Barriers to Discharge  Current Status/Progress Possible Resolutions Date Resolved   Physician    Inaccessible home environment;Decreased caregiver support;Medical stability;Home environment access/layout;Lack of/limited family support     See above  Therapies, DM meds, workup for constipation, IVF for AKI      Nursing                  PT                    OT                  SLP                SW                Discharge Planning/Teaching Needs:  Pt and sister now report that pt will d/c to sister's home, however, she will need to coordinate 24/7 support still.  Teaching to be completed with sister prior to d/c.   Team Discussion:  AKI - encouraging fluids. BS meds added;  Checking KUB and UA.  Has had n/v and loose stools all day today and not able to participate in tx.  Min/mod assist with tranfers overall;  Mod assist with ADLs.  Had a near fall yesterday due to his impulsivity.  Gait is very ataxic.  Setting supervision goals overall.  SW still working with pt/ sister to insure 24/7 supervision will be in place at sister's home.  Revisions to Treatment Plan:  None    Continued Need for Acute Rehabilitation Level of Care: The patient requires daily medical management by a physician with specialized training in physical medicine and rehabilitation for the following conditions: Daily direction of a multidisciplinary physical rehabilitation program to ensure safe treatment while eliciting the highest outcome that is of practical value to the patient.: Yes Daily medical management of patient stability for increased activity during participation in an intensive rehabilitation regime.: Yes Daily analysis of laboratory values and/or radiology reports with any subsequent need for medication adjustment of medical intervention for : Neurological problems;Renal problems;Mood/behavior problems  [place] Weatherholtz, [place] 08/02/2017, 3:43 PM

## 2017-08-02 NOTE — Progress Notes (Signed)
Social Work Patient ID: [name], male   DOB: 05-03-1956, 62 y.o.   MRN: [id number]  Met with pt and sister yesterday afternoon to review team conference and targeted d/c date of 3/21 and supervision goals.  Stressed to both the need for 24/7 supervision and sister notes, "I have people who can check on him when I'm at work and I only work about [place]mns away."  Sister has also asked about getting PCS aide for pt to assist as well.  Explained to both the team is recommending someone be in the home with pt at all times when he is discharged.  Having people check in on him will not be a safe set up.  [facility] and could take a couple of weeks to start after discharge.  I questioned if the original plan of pt returning home with his friends might be a more secure plan for the 24/7.  They both stated that they would not expect the friends to be able to provide that level of support either due to their own health issues.  Sister asked to have a few days to talk with others about helping them because they "don't want to do a nursing home."  I will follow up with pt and sister tomorrow to see if they have been able to begin pulling together 24/7 or if we need to change the d/c plan.  Will keep team posted.  Paxtyn Wisdom, LCSW

## 2017-08-03 ENCOUNTER — Inpatient Hospital Stay (HOSPITAL_COMMUNITY): Payer: Medicare HMO | Admitting: Physical Therapy

## 2017-08-03 ENCOUNTER — Inpatient Hospital Stay (HOSPITAL_COMMUNITY): Payer: Medicare HMO

## 2017-08-03 ENCOUNTER — Inpatient Hospital Stay (HOSPITAL_COMMUNITY): Payer: Medicare HMO | Admitting: Speech Pathology

## 2017-08-03 LAB — GLUCOSE, CAPILLARY
GLUCOSE-CAPILLARY: 158 mg/dL — AB (ref 65–99)
Glucose-Capillary: 118 mg/dL — ABNORMAL HIGH (ref 65–99)
Glucose-Capillary: 144 mg/dL — ABNORMAL HIGH (ref 65–99)
Glucose-Capillary: 142 mg/dL — ABNORMAL HIGH (ref 65–99)

## 2017-08-03 NOTE — Progress Notes (Signed)
Physical Therapy Weekly Progress Note  Patient Details  Name: [name] MRN: [id number] Date of Birth: 11-11-1955  Beginning of progress report period: July 27, 2017 End of progress report period: August 03, 2017  Today's Date: 08/03/2017 PT Individual Time: 0830-0924 PT Individual Time Calculation (min): 54 min   Pt in bed and agreeable to therapy.  Pt performs bed mobility with supervision and stand pivot transfers with min A, max cuing for safety. Gait with RW with feet on either side of green line to prevent scissoring with pt improving with visual cue.  Gait with cone negotiation with min A with min cuing for Rt sided awareness.  Quadruped and tall kneeling with focus on core and pelvis stabilization with alternating UE/LE lifts and trunk diagonals.  nustep x 8 minutes level 4 with pt requiring 1 seated rest break due to fatigue. Pt given elastic shoe laces and pt states he is excited to not be frustrated with donning/doffing shoes.  Pt left in bed with alarm set, needs at hand.  Patient has met 2 of 2 short term goals.  Pt has improved transfers and gait to min A level with RW and max cuing for safety awareness.  Pt still with decreased coordination and ataxic gait, improves with cuing.  Patient continues to demonstrate the following deficits muscle weakness, impaired timing and sequencing, ataxia, decreased coordination and decreased motor planning, decreased attention, decreased awareness, decreased problem solving, decreased safety awareness, decreased memory and delayed processing and decreased sitting balance, decreased standing balance, decreased postural control and decreased balance strategies and therefore will continue to benefit from skilled PT intervention to increase functional independence with mobility.  Patient progressing toward long term goals..  Continue plan of care.  PT Short Term Goals Week 1:  PT Short Term Goal 1 (Week 1): pt will consistently perform functional  transfers with mod A PT Short Term Goal 1 - Progress (Week 1): Met PT Short Term Goal 2 (Week 1): pt will gait in controlled environment x 50' with min A PT Short Term Goal 2 - Progress (Week 1): Met Week 2:  PT Short Term Goal 1 (Week 2): pt will demo gait in home environment with min A x 25' PT Short Term Goal 2 (Week 2): pt will perform stair negotiation x 2 steps without handrails with min A  Skilled Therapeutic Interventions/Progress Updates:  Ambulation/gait training;Discharge planning;Functional mobility training;Therapeutic Activities;Balance/vestibular training;Neuromuscular re-education;Therapeutic Exercise;Wheelchair propulsion/positioning;UE/LE Strength taining/ROM;Splinting/orthotics;Pain management;DME/adaptive equipment instruction;Cognitive remediation/compensation;Community reintegration;Patient/family education;Stair training;UE/LE Coordination activities     See Function Navigator for Current Functional Status.  Therapy/Group: Individual Therapy  Myeesha Shane 08/03/2017, 9:25 AM

## 2017-08-03 NOTE — Progress Notes (Signed)
Occupational Therapy Session Note  Patient Details  Name: [name] MRN: [id number] Date of Birth: [birth year]  Today's Date: 08/03/2017 OT Individual Time: 5465-6812 OT Individual Time Calculation (min): 72 min    Short Term Goals: Week 1:  OT Short Term Goal 1 (Week 1): Pt will complete bathing with min assist OT Short Term Goal 2 (Week 1): Pt will complete LB dressing with min assist OT Short Term Goal 3 (Week 1): Pt will complete 2 grooming tasks in standing  OT Short Term Goal 4 (Week 1): Pt will utilize RUE as gross assist during self-care tasks with min cues  Skilled Therapeutic Interventions/Progress Updates:    1:1. Pt attempting to refuse tx d/t fatigue however with encouragement pt agreeable. Pt dons shoes with elastic shoe laces and Vc to not untie shoes as laces will stretch. Pt stand pivot transfer for all transfers and sit to stand with RW throughout session with touching A and Vc for safety awareness/hand placement. Pt sits EOM to complete various fine motor activities (pipe tree assembly with min question cues to make correct figure, checker/coin manipulation for palm<>finger translation, and buttons) for improved coordination and [place] of RUE. Pt stands to complete dynamic reaching activity with clothespins placing on target across midline with forced use of RUE for reaching and manual facilitation of trunk rotation for improved NMR. Exited session with pt seated in bed, call light in reach, exit alarm on and all needs met.   Therapy Documentation Precautions:  Precautions Precautions: Fall Restrictions Weight Bearing Restrictions: No General:  See Function Navigator for Current Functional Status.   Therapy/Group: Individual Therapy  [name] 08/03/2017, 2:11 PM

## 2017-08-03 NOTE — Progress Notes (Signed)
Speech Language Pathology Discharge Summary  Patient Details  Name: [name] MRN: [id number] Date of Birth: Jun 27, 1955  Today's Date: 08/03/2017 SLP Individual Time: 1005-1055 SLP Individual Time Calculation (min): 50 min   Skilled Therapeutic Interventions:  Pt was seen for skilled ST targeting cognitive goals.  Pt requested to get up to the sink to brush his teeth and applied the brakes to his wheelchair before transferring with mod I.  He completed oral/denture care with mod I.  SLP introduced a new game to address problem solving and recall of new information.  Pt overall was supervision for working memory of task rules and procedures as well as for planning and executing of a problem solving strategy.  SLP reviewed and reinforced memory and organizational strategies to facilitate safe transition home, emphasizing keeping a calendar of daily information, using a pill box with the help of his sister for managing medications, and keeping a consistent routine.  When returned to his room, pt utilized his therapy schedule to facilitate recall of upcoming therapy sessions with mod I.  Pt has met all ST goals and as a result, recommend discharging pt from [facility] services. Pt was left in bed with bed alarm set and call bell within reach.     Patient has met 3 of 3 long term goals.  Patient to discharge at overall Supervision level.  Reasons goals not met:     Clinical Impression/Discharge Summary:   Pt has made functional gains and is discharging from [facility] services having met 3 out of 3 long term goals.  Pt is currently supervision for memory, problem solving, and anticipatory awareness and is back to baseline for cognition.  Pt education is complete.  As a result, no further ST needs are indicated at this time.     Care Partner:  Caregiver Able to Provide Assistance: (TBD)  Type of Caregiver Assistance: (TBD)  Recommendation:  None      Equipment: none recommended by SLP    Reasons for  discharge: Treatment goals met   Patient/Family Agrees with Progress Made and Goals Achieved: Yes   Function:  Eating Eating                 Cognition Comprehension Comprehension assist level: Follows basic conversation/direction with extra time/assistive device  Expression   Expression assist level: Expresses basic needs/ideas: With extra time/assistive device  Social Interaction Social Interaction assist level: Interacts appropriately with others with medication or extra time (anti-anxiety, antidepressant).  Problem Solving Problem solving assist level: Solves basic 90% of the time/requires cueing < 10% of the time  Memory Memory assist level: Recognizes or recalls 90% of the time/requires cueing < 10% of the time   Emilio Math 08/03/2017, 12:29 PM

## 2017-08-03 NOTE — Progress Notes (Signed)
[facility] PHYSICAL MEDICINE & REHABILITATION     PROGRESS NOTE  Subjective/Complaints:  Patient seen lying in bed this morning. He states he did not sleep well overnight due to abdominal discomfort, but then states that his abdomen feels fine this morning.  ROS: Denies CP, SOB, nausea, vomiting, diarrhea.  Objective: Vital Signs: Blood pressure 128/74, pulse 92, temperature 97.7 F (36.5 C), temperature source Oral, resp. rate 16, height 5\' 7"  (1.702 m), weight 67.6 kg (149 lb 0.5 oz), SpO2 97 %. Dg Abd 1 View  Result Date: 08/01/2017 CLINICAL DATA:  Abdominal pain, constipation. EXAM: Supine abdominal radiograph. COMPARISON:  PET-CT study dated November 23, 2009. FINDINGS: The bowel gas pattern is normal. The stool burden is not excessive. The bony structures are unremarkable. There are linear calcifications in the upper abdomen to the left of midline which are of uncertain etiology. These may be vascular in nature. There are vascular calcifications in the iliac arteries bilaterally. There are surgical clips in the gallbladder fossa. IMPRESSION: Normal colonic stool burden. No definite acute intra-abdominal abnormality. Electronically Signed   By: David  Martinique M.D.   On: 08/01/2017 12:59   Recent Labs    08/02/17 0550  WBC 8.3  HGB 14.4  HCT 42.6  PLT 274   Recent Labs    08/02/17 0550  NA 135  K 4.2  CL 101  GLUCOSE 108*  BUN 28*  CREATININE 0.92  CALCIUM 9.2   CBG (last 3)  Recent Labs    08/02/17 1635 08/02/17 2213 08/03/17 0656  GLUCAP 119* 102* 118*    Wt Readings from Last 3 Encounters:  07/27/17 67.6 kg (149 lb 0.5 oz)  07/22/17 66.8 kg (147 lb 4.3 oz)  06/28/17 68.9 kg (151 lb 12.8 oz)    Physical Exam:  BP 128/74 (BP Location: Left Arm)   Pulse 92   Temp 97.7 F (36.5 C) (Oral)   Resp 16   Ht 5\' 7"  (1.702 m)   Wt 67.6 kg (149 lb 0.5 oz)   SpO2 97%   BMI 23.34 kg/m  Constitutional: He appearswell-developedand well-nourished. HENT:  Normocephalicand atraumatic.  Eyes:EOMare normal. No discharge. Cardiovascular:RRR. No JVD. Respiratory:Effort normal and breath sounds normal.  GI: Bowel sounds are normal. Nondistended, nontender.  Musculoskeletal: He exhibits noedema, no tenderness.  Neurological: He isalertand oriented.  Motor: LUE/LLE: 4+/5 proximal to distal 4 +/5 right delt bi tri grip 4/5 right HF 4/5 KE, 4+/5 R ankle DF  Skin: Skin iswarmand dry. He isnot diaphoretic.  Psychiatric: His affect isblunt. He iswithdrawn. He expressesimpulsivityand inappropriate judgment.  Assessment/Plan: 1. Functional deficits secondary to left Basal ganglia and pontine infarcts which require 3+ hours per day of interdisciplinary therapy in a comprehensive inpatient rehab setting. Physiatrist is providing close team supervision and 24 hour management of active medical problems listed below. Physiatrist and rehab team continue to assess barriers to discharge/monitor patient progress toward functional and medical goals.  Function:  Bathing Bathing position   Position: Shower  Bathing parts Body parts bathed by patient: Right arm, Chest, Abdomen, Front perineal area, Right upper leg, Left upper leg, Left arm, Buttocks, Right lower leg, Left lower leg Body parts bathed by helper: Back  Bathing assist Assist Level: Touching or steadying assistance(Pt > 75%)      Upper Body Dressing/Undressing Upper body dressing   What is the patient wearing?: Pull over shirt/dress     Pull over shirt/dress - Perfomed by patient: Thread/unthread right sleeve, Thread/unthread left sleeve, Put head through opening,  Pull shirt over trunk Pull over shirt/dress - Perfomed by helper: Thread/unthread right sleeve        Upper body assist Assist Level: Touching or steadying assistance(Pt > 75%)      Lower Body Dressing/Undressing Lower body dressing   What is the patient wearing?: Underwear, Pants, Non-skid slipper  socks Underwear - Performed by patient: Thread/unthread right underwear leg, Thread/unthread left underwear leg, Pull underwear up/down   Pants- Performed by patient: Thread/unthread right pants leg, Thread/unthread left pants leg, Pull pants up/down, Fasten/unfasten pants Pants- Performed by helper: Thread/unthread right pants leg, Pull pants up/down Non-skid slipper socks- Performed by patient: Don/doff right sock, Don/doff left sock Non-skid slipper socks- Performed by helper: Don/doff right sock, Don/doff left sock                  Lower body assist Assist for lower body dressing: Touching or steadying assistance (Pt > 75%)      Toileting Toileting   Toileting steps completed by patient: Adjust clothing prior to toileting, Performs perineal hygiene, Adjust clothing after toileting Toileting steps completed by helper: Performs perineal hygiene Toileting Assistive Devices: Grab bar or rail  Toileting assist Assist level: Touching or steadying assistance (Pt.75%)   Transfers Chair/bed transfer   Chair/bed transfer method: Stand pivot Chair/bed transfer assist level: Touching or steadying assistance (Pt > 75%) Chair/bed transfer assistive device: Armrests     Locomotion Ambulation     Max distance: 50 ft Assist level: Moderate assist (Pt 50 - 74%)   Wheelchair     Max wheelchair distance: 150 ft Assist Level: Supervision or verbal cues  Cognition Comprehension Comprehension assist level: Follows basic conversation/direction with extra time/assistive device  Expression Expression assist level: Expresses basic 75 - 89% of the time/requires cueing 10 - 24% of the time. Needs helper to occlude trach/needs to repeat words.  Social Interaction Social Interaction assist level: Interacts appropriately 90% of the time - Needs monitoring or encouragement for participation or interaction.  Problem Solving Problem solving assist level: Solves basic 75 - 89% of the time/requires  cueing 10 - 24% of the time  Memory Memory assist level: Recognizes or recalls 75 - 89% of the time/requires cueing 10 - 24% of the time    Medical Problem List and Plan: 1. 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 DVT Prophylaxis/Anticoagulation: Pharmaceutical:Lovenox 3. Pain Management:N/A 4. Mood:LCSW to follow for evaluation and support. 5. Neuropsych: This patientis not fullycapable of making decisions on hisown behalf.   Continue tele-sitter 6.Rash/Skin/Wound Care:Continue Triamcinolone cream tid. Monitor rash for healing. Maintain adequate nutritional and hydration status. 7. Fluids/Electrolytes/Nutrition:Monitor I/O.  8. HTN: Monitor BP bid. On Prinivil, indapamide,   Vitals:   08/02/17 1518 08/03/17 0500  BP: 129/77 128/74  Pulse: 95 92  Resp: 16 16  Temp: (!) 97.5 F (36.4 C) 97.7 F (36.5 C)  SpO2: 97% 97%    Relatively controlled on 3/8 9. CAD:On ASA and lipitor. 10. H/o Hep C:No meds 11. COPD/Lung CA s/p XRT: On Breo and Ellipta daily. 12. Depression: On Wellbutrin XL. 13. Constipation: Increased MiraLAX to twice a day    Increased bowel reg on 3/5, again on 3/6   KUB reviewed, relatively unremarkable, but was taken and patient had a good bowel movement.   UA/U culture relatively unremarkable 14. Itching/rash: Due to recent bed bug infestation. Added Sarna lotion. 15. Prediabetes   Cont to monitor   CBGs ordered  SSI ordered, added qhs SSI   Diet changed to Carb modified   Slightly elevated on 3/8 16. Hyponatremia   Na 135 on 3/7   Labs ordered for Monday   Cont to monitor 17. AKI   Cr 0.92 on 3/7 after IVF   Labs ordered for Monday   Encourage fluids   Continue to monitor   IVF [place] on 3/7  LOS (Days) 8 A FACE TO FACE EVALUATION WAS PERFORMED  Thresia Ramanathan [name] 08/03/2017 9:10 AM

## 2017-08-04 ENCOUNTER — Inpatient Hospital Stay (HOSPITAL_COMMUNITY): Payer: Medicare HMO | Admitting: Occupational Therapy

## 2017-08-04 LAB — GLUCOSE, CAPILLARY
Glucose-Capillary: 109 mg/dL — ABNORMAL HIGH (ref 65–99)
Glucose-Capillary: 143 mg/dL — ABNORMAL HIGH (ref 65–99)
Glucose-Capillary: 203 mg/dL — ABNORMAL HIGH (ref 65–99)
Glucose-Capillary: 101 mg/dL — ABNORMAL HIGH (ref 65–99)

## 2017-08-04 NOTE — Progress Notes (Signed)
Occupational Therapy Session Note  Patient Details  Name: [name] MRN: [id number] Date of Birth: [birth year]  Today's Date: 08/04/2017 OT Individual Time: 1500-1534(make up time) OT Individual Time Calculation (min): 34 min    Short Term Goals: Week 2:  OT Short Term Goal 1 (Week 2): Pt will complete bathing at sit > stand level with supervision OT Short Term Goal 2 (Week 2): Pt will complete LB dressing at sit > stand level with supervision OT Short Term Goal 3 (Week 2): Pt will complete toilet transfers with min assist with LRAD OT Short Term Goal 4 (Week 2): Pt will complete 2 grooming tasks in standing with supervision  Skilled Therapeutic Interventions/Progress Updates:    Pt seen for make up session with focus on RUE NMR.  Engaged in pipe tree puzzle with focus on gross motor control with reaching across midline and outside BOS to obtain items as well as reaching vertically to replicate pattern, noted increased difficulty (decreased coordination) the more vertical the structure.  Educated on visually attending to movements to increase motor control.  Engaged in in-hand manipulation and translation with small pegs with focus on fine motor control.  Pt with increased ability to place pegs in pegboard and utilized translation with improved control for up to 3 pegs.  Pt reports mild decrease in sensation in Rt finger tips but able to identify object in hand.  Pt asking questions about recovery process with therapist answering to pt satisfaction.  Returned to bed and left with all needs in reach.  Therapy Documentation Precautions:  Precautions Precautions: Fall Restrictions Weight Bearing Restrictions: No Pain:  Pt with no c/o pain  See Function Navigator for Current Functional Status.   Therapy/Group: Individual Therapy  [name] 08/04/2017, 4:01 PM

## 2017-08-04 NOTE — Plan of Care (Signed)
  RH SAFETY RH STG ADHERE TO SAFETY PRECAUTIONS W/ASSISTANCE/DEVICE Description STG Adhere to Safety Precautions With min Assistance/Device.  08/04/2017 0522 - Progressing by Blinda Leatherwood, RN  Continues tele sister

## 2017-08-04 NOTE — Progress Notes (Signed)
Occupational Therapy Weekly Progress Note  Patient Details  Name: [name] MRN: [id number] Date of Birth: [birth year]  Beginning of progress report period: July 27, 2017 End of progress report period: August 04, 2017  Today's Date: 08/04/2017 OT Individual Time: 1000-1057 OT Individual Time Calculation (min): 57 min    Patient has met 4 of 4 short term goals.  Pt is making steady progress towards goals.  Pt currently requires min/steady assist for standing balance during ADLs due to impulsivity and Rt lean.  Pt is demonstrating increased recall of techniques to "slow down" with transitional movements to improve safety, however despite ability to recall does not always utilize strategy due to impulsivity.  Pt progress slowed due to missed time secondary to not feeling well for 1-2 days.    Patient continues to demonstrate the following deficits: muscle weakness, unbalanced muscle activation and decreased coordination, decreased visual perceptual skills and decreased visual motor skills, decreased attention, decreased awareness, decreased problem solving, decreased safety awareness and decreased memory and decreased standing balance, decreased postural control, hemiplegia and decreased balance strategies and therefore will continue to benefit from skilled OT intervention to enhance overall performance with BADL and Reduce care partner burden.  Patient progressing toward long term goals..  Continue plan of care.  OT Short Term Goals Week 1:  OT Short Term Goal 1 (Week 1): Pt will complete bathing with min assist OT Short Term Goal 1 - Progress (Week 1): Met OT Short Term Goal 2 (Week 1): Pt will complete LB dressing with min assist OT Short Term Goal 2 - Progress (Week 1): Met OT Short Term Goal 3 (Week 1): Pt will complete 2 grooming tasks in standing  OT Short Term Goal 3 - Progress (Week 1): Met OT Short Term Goal 4 (Week 1): Pt will utilize RUE as gross assist during self-care tasks  with min cues OT Short Term Goal 4 - Progress (Week 1): Met Week 2:  OT Short Term Goal 1 (Week 2): Pt will complete bathing at sit > stand level with supervision OT Short Term Goal 2 (Week 2): Pt will complete LB dressing at sit > stand level with supervision OT Short Term Goal 3 (Week 2): Pt will complete toilet transfers with min assist with LRAD OT Short Term Goal 4 (Week 2): Pt will complete 2 grooming tasks in standing with supervision  Skilled Therapeutic Interventions/Progress Updates:    Treatment session with focus on functional mobility, standing balance, and RUE NMR.  Pt received supine in bed reporting having a "rough" night but willing to participate in therapy session.  Used urinal in standing with min/steady assist from therapist for standing balance with UE support on RW.  Ambulated to sink to complete grooming tasks.  Completed oral care, hand hygiene, washing face, and combing hair all in standing; noted increased lean to Rt as he fatigued with decreased awareness.  Engaged in table top activity with focus on RUE gross and fine motor control with sorting and flipping cards.  Pt again with increased Rt weight shift with fatigue.  Min cues for speed of activity to increase motor control with inconsistent results.  Pt reports fatigue from activity and not sleeping well.  Returned to room and transferred back to bed with min assist.  Therapy Documentation Precautions:  Precautions Precautions: Fall Restrictions Weight Bearing Restrictions: No Pain: Pt with no c/o pain  See Function Navigator for Current Functional Status.   Therapy/Group: Individual Therapy  [name] 08/04/2017, 9:56 AM

## 2017-08-04 NOTE — Progress Notes (Signed)
[facility] PHYSICAL MEDICINE & REHABILITATION     PROGRESS NOTE  Subjective/Complaints:  Patient seen lying in bed this morning. He states he slept well overnight.  ROS: Denies CP, SOB, nausea, vomiting, diarrhea.  Objective: Vital Signs: Blood pressure 121/70, pulse 94, temperature 98.3 F (36.8 C), temperature source Oral, resp. rate 16, height 5\' 7"  (1.702 m), weight 67.6 kg (149 lb 0.5 oz), SpO2 96 %. No results found. Recent Labs    08/02/17 0550  WBC 8.3  HGB 14.4  HCT 42.6  PLT 274   Recent Labs    08/02/17 0550  NA 135  K 4.2  CL 101  GLUCOSE 108*  BUN 28*  CREATININE 0.92  CALCIUM 9.2   CBG (last 3)  Recent Labs    08/03/17 1713 08/03/17 2209 08/04/17 0620  GLUCAP 158* 142* 109*    Wt Readings from Last 3 Encounters:  07/27/17 67.6 kg (149 lb 0.5 oz)  07/22/17 66.8 kg (147 lb 4.3 oz)  06/28/17 68.9 kg (151 lb 12.8 oz)    Physical Exam:  BP 121/70 (BP Location: Left Arm)   Pulse 94   Temp 98.3 F (36.8 C) (Oral)   Resp 16   Ht 5\' 7"  (1.702 m)   Wt 67.6 kg (149 lb 0.5 oz)   SpO2 96%   BMI 23.34 kg/m  Constitutional: He appearswell-developedand well-nourished. HENT: Normocephalicand atraumatic.  Eyes:EOMare normal. No discharge. Cardiovascular:RRR. No JVD. Respiratory:Effort normal and breath sounds normal.  GI: Bowel sounds are normal. Nondistended, nontender.  Musculoskeletal: He exhibits noedema, no tenderness.  Neurological: He isalertand oriented.  Motor: LUE/LLE: 4+/5 proximal to distal 4+/5 right delt bi tri grip 4/5 right HF 4/5 KE, 4+/5 R ankle DF (stable)  Skin: Skin iswarmand dry. He isnot diaphoretic.  Psychiatric: His affect isblunt. He iswithdrawn. He expressesimpulsivityand inappropriate judgment.  Assessment/Plan: 1. Functional deficits secondary to left Basal ganglia and pontine infarcts which require 3+ hours per day of interdisciplinary therapy in a comprehensive inpatient rehab setting. Physiatrist  is providing close team supervision and 24 hour management of active medical problems listed below. Physiatrist and rehab team continue to assess barriers to discharge/monitor patient progress toward functional and medical goals.  Function:  Bathing Bathing position   Position: Shower  Bathing parts Body parts bathed by patient: Right arm, Chest, Abdomen, Front perineal area, Right upper leg, Left upper leg, Left arm, Buttocks, Right lower leg, Left lower leg Body parts bathed by helper: Back  Bathing assist Assist Level: Touching or steadying assistance(Pt > 75%)      Upper Body Dressing/Undressing Upper body dressing   What is the patient wearing?: Pull over shirt/dress     Pull over shirt/dress - Perfomed by patient: Thread/unthread right sleeve, Thread/unthread left sleeve, Put head through opening, Pull shirt over trunk Pull over shirt/dress - Perfomed by helper: Thread/unthread right sleeve        Upper body assist Assist Level: Touching or steadying assistance(Pt > 75%)      Lower Body Dressing/Undressing Lower body dressing   What is the patient wearing?: Shoes Underwear - Performed by patient: Thread/unthread right underwear leg, Thread/unthread left underwear leg, Pull underwear up/down   Pants- Performed by patient: Thread/unthread right pants leg, Thread/unthread left pants leg, Pull pants up/down, Fasten/unfasten pants Pants- Performed by helper: Thread/unthread right pants leg, Pull pants up/down Non-skid slipper socks- Performed by patient: Don/doff right sock, Don/doff left sock Non-skid slipper socks- Performed by helper: Don/doff right sock, Don/doff left sock  Shoes - Performed by patient: Don/doff right shoe, Don/doff left shoe(elastic laces)            Lower body assist Assist for lower body dressing: Touching or steadying assistance (Pt > 75%)      Toileting Toileting   Toileting steps completed by patient: Adjust clothing prior to toileting,  Performs perineal hygiene, Adjust clothing after toileting Toileting steps completed by helper: Performs perineal hygiene Toileting Assistive Devices: Grab bar or rail  Toileting assist Assist level: Touching or steadying assistance (Pt.75%)   Transfers Chair/bed transfer   Chair/bed transfer method: Stand pivot Chair/bed transfer assist level: Touching or steadying assistance (Pt > 75%) Chair/bed transfer assistive device: Armrests     Locomotion Ambulation     Max distance: 50 ft Assist level: Moderate assist (Pt 50 - 74%)   Wheelchair     Max wheelchair distance: 150 ft Assist Level: Supervision or verbal cues  Cognition Comprehension Comprehension assist level: Follows basic conversation/direction with extra time/assistive device  Expression Expression assist level: Expresses basic needs/ideas: With extra time/assistive device  Social Interaction Social Interaction assist level: Interacts appropriately 75 - 89% of the time - Needs redirection for appropriate language or to initiate interaction.  Problem Solving Problem solving assist level: Solves basic 75 - 89% of the time/requires cueing 10 - 24% of the time  Memory Memory assist level: Recognizes or recalls 90% of the time/requires cueing < 10% of the time    Medical Problem List and Plan: 1. 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 DVT Prophylaxis/Anticoagulation: Pharmaceutical:Lovenox 3. Pain Management:N/A 4. Mood:LCSW to follow for evaluation and support. 5. Neuropsych: This patientis not fullycapable of making decisions on hisown behalf.   Continue tele-sitter 6.Rash/Skin/Wound Care:Continue Triamcinolone cream tid. Monitor rash for healing. Maintain adequate nutritional and hydration status. 7. Fluids/Electrolytes/Nutrition:Monitor I/O.  8. HTN: Monitor BP bid. On Prinivil, indapamide,   Vitals:   08/03/17 1520 08/04/17 0528  BP: 127/76  121/70  Pulse: 98 94  Resp: 17 16  Temp: (!) 97.4 F (36.3 C) 98.3 F (36.8 C)  SpO2: 98% 96%    Relatively controlled on 3/9 9. CAD:On ASA and lipitor. 10. H/o Hep C:No meds 11. COPD/Lung CA s/p XRT: On Breo and Ellipta daily. 12. Depression: On Wellbutrin XL. 13. Constipation: Increased MiraLAX to twice a day    Increased bowel reg on 3/5, again on 3/6   KUB reviewed, relatively unremarkable, but was taken and patient had a good bowel movement.   UA/U culture relatively unremarkable 14. Itching/rash: Due to recent bed bug infestation. Added Sarna lotion. 15. Prediabetes   Cont to monitor   CBGs ordered   SSI ordered, added qhs SSI   Diet changed to Carb modified   Slightly labile, but overall controlled on 3/9 16. Hyponatremia   Na 135 on 3/7   Labs ordered for Monday   Cont to monitor 17. AKI   Cr 0.92 on 3/7 after IVF   Labs ordered for Monday   Encourage fluids   Continue to monitor   IVF [place] on 3/7  LOS (Days) 9 A FACE TO FACE EVALUATION WAS PERFORMED  Alsha Meland [name] 08/04/2017 11:15 AM

## 2017-08-05 ENCOUNTER — Inpatient Hospital Stay (HOSPITAL_COMMUNITY): Payer: Medicare HMO

## 2017-08-05 ENCOUNTER — Inpatient Hospital Stay (HOSPITAL_COMMUNITY): Payer: Medicare HMO | Admitting: Occupational Therapy

## 2017-08-05 LAB — GLUCOSE, CAPILLARY
GLUCOSE-CAPILLARY: 169 mg/dL — AB (ref 65–99)
GLUCOSE-CAPILLARY: 102 mg/dL — AB (ref 65–99)
Glucose-Capillary: 117 mg/dL — ABNORMAL HIGH (ref 65–99)
Glucose-Capillary: 129 mg/dL — ABNORMAL HIGH (ref 65–99)

## 2017-08-05 NOTE — Progress Notes (Signed)
Occupational Therapy Session Note  Patient Details  Name: [name] MRN: [id number] Date of Birth: Nov 08, 1955  Today's Date: 08/05/2017 OT Individual Time: 1004-1102 OT Individual Time Calculation (min): 58 min   Short Term Goals: Week 2:  OT Short Term Goal 1 (Week 2): Pt will complete bathing at sit > stand level with supervision OT Short Term Goal 2 (Week 2): Pt will complete LB dressing at sit > stand level with supervision OT Short Term Goal 3 (Week 2): Pt will complete toilet transfers with min assist with LRAD OT Short Term Goal 4 (Week 2): Pt will complete 2 grooming tasks in standing with supervision  Skilled Therapeutic Interventions/Progress Updates:    Pt greeted at scheduled therapy time and reported having spell of vomiting while eating breakfast. Agreeable to defer tx to 1 hour later for him to rest. When OT arrived again, pt wanted to shower and get dressed (agreeable to use aromatherapy techniques if nausea increased). Tx focus on Rt attention, Rt NMR, balance, and activity tolerance. He ambulated with steady assist and RW into bathroom with cues to widen BOS and for DME mgt/safety. Pt transferred to TTB and doffed clothing. When shower was turned on, pt reported intensified nausea and began vomiting into a basin. Vomit bright yellow with bile consistency. RN made aware after session. Pt provided peppermint oil via inhalation post vomiting to decrease nausea, and then afterwards intermittently throughout session. He did not vomit again, but reported feeling sick to his stomach and not knowing why. He completed bathing and dressing with steady assist overall. Pt then ambulated to sink to complete oral care/grooming tasks, integrating R UE appropriately. Afterwards he ambulated to toilet to void bowels with cues to avoid door on Rt side. Assist required for hygiene thoroughness post BM. He completed clothing mgt x2 himself. Pt them ambulated back to bed and returned to supine.  He verbalized feeling too fatigued to stay up for next therapist. Retrieved cold wet wash cloth to place on forehead and assisted pt with repositioning for comfort. After consultation with RN, she was in to assess pt prior to session exit.   Therapy Documentation Precautions:  Precautions Precautions: Fall Restrictions Weight Bearing Restrictions: No Vital Signs: Therapy Vitals Temp: (!) 96.1 F (35.6 C) Temp Source: Axillary Pulse Rate: 89 Resp: 18 BP: 117/72 Patient Position (if appropriate): Lying Oxygen Therapy SpO2: 98 % O2 Device: Room Air Pain: Pain Assessment Pain Assessment: No/denies pain Pain Score: 0-No pain ADL:  :    See Function Navigator for Current Functional Status.   Therapy/Group: Individual Therapy  Jonty Morrical A Maurico Perrell 08/05/2017, 11:54 AM

## 2017-08-05 NOTE — Progress Notes (Signed)
Physical Therapy Session Note  Patient Details  Name: [name] MRN: [id number] Date of Birth: 1956/04/20  Today's Date: 08/05/2017 PT Missed Time:45 minutes of make up time  Short Term Goals: Week 2:  PT Short Term Goal 1 (Week 2): pt will demo gait in home environment with min A x 25' PT Short Term Goal 2 (Week 2): pt will perform stair negotiation x 2 steps without handrails with min A  Skilled Therapeutic Interventions/Progress Updates:    Pt reports not feeling well this morning. States that he has been nauseous and has vomited multiple times this morning, RN aware.  Pt missed 45 minutes of make up time this session, therapist will follow up this afternoon.   Therapy Documentation Precautions:  Precautions Precautions: Fall Restrictions Weight Bearing Restrictions: No   See Function Navigator for Current Functional Status.   Therapy/Group: Individual Therapy  Netta Corrigan, PT, DPT 08/05/2017, 7:58 AM

## 2017-08-05 NOTE — Progress Notes (Signed)
Spoke with Dr Posey Pronto concerning pt. No new orders at this time. Cont to monitor for any changes. Prn compazine given and effectiveness noted. Pt resting in bed at this time.

## 2017-08-05 NOTE — Progress Notes (Signed)
[facility] PHYSICAL MEDICINE & REHABILITATION     PROGRESS NOTE  Subjective/Complaints:  Patient seen lying in bed this morning. He states he slept well overnight. He wants to know his discharge date.  ROS: Denies CP, SOB, nausea, vomiting, diarrhea.  Objective: Vital Signs: Blood pressure 128/88, pulse 93, temperature 97.7 F (36.5 C), temperature source Oral, resp. rate 18, height 5\' 7"  (1.702 m), weight 67.6 kg (149 lb 0.5 oz), SpO2 97 %. No results found. No results for input(s): WBC, HGB, HCT, PLT in the last 72 hours. No results for input(s): NA, K, CL, GLUCOSE, BUN, CREATININE, CALCIUM in the last 72 hours.  Invalid input(s): CO CBG (last 3)  Recent Labs    08/04/17 1802 08/04/17 2158 08/05/17 0643  GLUCAP 203* 101* 117*    Wt Readings from Last 3 Encounters:  07/27/17 67.6 kg (149 lb 0.5 oz)  07/22/17 66.8 kg (147 lb 4.3 oz)  06/28/17 68.9 kg (151 lb 12.8 oz)    Physical Exam:  BP 128/88 (BP Location: Left Arm)   Pulse 93   Temp 97.7 F (36.5 C) (Oral)   Resp 18   Ht 5\' 7"  (1.702 m)   Wt 67.6 kg (149 lb 0.5 oz)   SpO2 97%   BMI 23.34 kg/m  Constitutional: He appearswell-developedand well-nourished. HENT: Normocephalicand atraumatic.  Eyes:EOMare normal. No discharge. Cardiovascular:RRR. No JVD. Respiratory:Effort normal and breath sounds normal.  GI: Bowel sounds are normal. Nondistended, nontender.  Musculoskeletal: He exhibits noedema, no tenderness.  Neurological: He isalertand oriented.  Motor: LUE/LLE: 4+/5 proximal to distal 4+/5 right delt bi tri grip 4/5 right HF 4/5 KE, 4+/5 R ankle DF (unchanged)  Skin: Skin iswarmand dry. He isnot diaphoretic.  Psychiatric: His affect isblunt. He iswithdrawn. He expressesimpulsivityand inappropriate judgment.  Assessment/Plan: 1. Functional deficits secondary to left Basal ganglia and pontine infarcts which require 3+ hours per day of interdisciplinary therapy in a comprehensive  inpatient rehab setting. Physiatrist is providing close team supervision and 24 hour management of active medical problems listed below. Physiatrist and rehab team continue to assess barriers to discharge/monitor patient progress toward functional and medical goals.  Function:  Bathing Bathing position   Position: Shower  Bathing parts Body parts bathed by patient: Right arm, Chest, Abdomen, Front perineal area, Right upper leg, Left upper leg, Left arm, Buttocks, Right lower leg, Left lower leg Body parts bathed by helper: Back  Bathing assist Assist Level: Touching or steadying assistance(Pt > 75%)      Upper Body Dressing/Undressing Upper body dressing   What is the patient wearing?: Pull over shirt/dress     Pull over shirt/dress - Perfomed by patient: Thread/unthread right sleeve, Thread/unthread left sleeve, Put head through opening, Pull shirt over trunk Pull over shirt/dress - Perfomed by helper: Thread/unthread right sleeve        Upper body assist Assist Level: Touching or steadying assistance(Pt > 75%)      Lower Body Dressing/Undressing Lower body dressing   What is the patient wearing?: Shoes Underwear - Performed by patient: Thread/unthread right underwear leg, Thread/unthread left underwear leg, Pull underwear up/down   Pants- Performed by patient: Thread/unthread right pants leg, Thread/unthread left pants leg, Pull pants up/down, Fasten/unfasten pants Pants- Performed by helper: Thread/unthread right pants leg, Pull pants up/down Non-skid slipper socks- Performed by patient: Don/doff right sock, Don/doff left sock Non-skid slipper socks- Performed by helper: Don/doff right sock, Don/doff left sock     Shoes - Performed by patient: Don/doff right shoe, Don/doff left  shoe(elastic laces)            Lower body assist Assist for lower body dressing: Touching or steadying assistance (Pt > 75%)      Toileting Toileting   Toileting steps completed by patient:  Adjust clothing prior to toileting, Performs perineal hygiene, Adjust clothing after toileting Toileting steps completed by helper: Performs perineal hygiene Toileting Assistive Devices: Grab bar or rail  Toileting assist Assist level: Supervision or verbal cues   Transfers Chair/bed transfer   Chair/bed transfer method: Stand pivot Chair/bed transfer assist level: Touching or steadying assistance (Pt > 75%) Chair/bed transfer assistive device: Armrests     Locomotion Ambulation     Max distance: 50 ft Assist level: Moderate assist (Pt 50 - 74%)   Wheelchair     Max wheelchair distance: 150 ft Assist Level: Supervision or verbal cues  Cognition Comprehension Comprehension assist level: Follows basic conversation/direction with extra time/assistive device  Expression Expression assist level: Expresses basic needs/ideas: With no assist  Social Interaction Social Interaction assist level: Interacts appropriately 75 - 89% of the time - Needs redirection for appropriate language or to initiate interaction.  Problem Solving Problem solving assist level: Solves basic 75 - 89% of the time/requires cueing 10 - 24% of the time  Memory Memory assist level: Recognizes or recalls 90% of the time/requires cueing < 10% of the time    Medical Problem List and Plan: 1. 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 DVT Prophylaxis/Anticoagulation: Pharmaceutical:Lovenox 3. Pain Management:N/A 4. Mood:LCSW to follow for evaluation and support. 5. Neuropsych: This patientis not fullycapable of making decisions on hisown behalf.   Continue tele-sitter 6.Rash/Skin/Wound Care:Continue Triamcinolone cream tid. Monitor rash for healing. Maintain adequate nutritional and hydration status. 7. Fluids/Electrolytes/Nutrition:Monitor I/O.  8. HTN: Monitor BP bid. On Prinivil, indapamide,   Vitals:   08/04/17 1600 08/05/17 0455  BP: 95/75  128/88  Pulse: (!) 105 93  Resp: 16 18  Temp: 98.3 F (36.8 C) 97.7 F (36.5 C)  SpO2: 100% 97%    Relatively controlled on 3/10 9. CAD:On ASA and lipitor. 10. H/o Hep C:No meds 11. COPD/Lung CA s/p XRT: On Breo and Ellipta daily. 12. Depression: On Wellbutrin XL. 13. Constipation: Increased MiraLAX to twice a day    Increased bowel reg on 3/5, again on 3/6   KUB reviewed, relatively unremarkable, but was taken and patient had a good bowel movement.   UA/U culture relatively unremarkable   Improving  14. Itching/rash: Due to recent bed bug infestation. Added Sarna lotion. 15. Prediabetes   Cont to monitor   CBGs ordered   SSI ordered, added qhs SSI   Diet changed to Carb modified   Slightly labile, but overall controlled on 3/10 16. Hyponatremia   Na 135 on 3/7   Labs ordered for tomorrow   Cont to monitor 17. AKI   Cr 0.92 on 3/7 after IVF   Labs ordered for tomorrow   Encourage fluids   Continue to monitor   IVF [place] on 3/7  LOS (Days) 10 A FACE TO FACE EVALUATION WAS PERFORMED  Ankit [name] 08/05/2017 7:51 AM

## 2017-08-05 NOTE — Progress Notes (Signed)
Pt had 2x of emesis bright yellow. Had BM during OT session after taking shower. Pt reports nauseous feeling. Denies pain or dizziness. VS stable CBG 169. Pt resting in bed with HOB elevated.

## 2017-08-05 NOTE — Progress Notes (Signed)
Occupational Therapy Session Note  Patient Details  Name: [name] MRN: [id number] Date of Birth: 03-01-1956  Today's Date: 08/05/2017 OT Individual Time: 4098-1191 OT Individual Time Calculation (min): 60 min   Skilled Therapeutic Interventions/Progress Updates:    1;1. Pt reporting no pain or nausea at this time. Pt completes all sit to stand and stand pivot transfers with steady A and no AD and Vc for hand placement and controlled decent to sitting. Pt propels w/c to all tx spaces with supervision and VC for steering/pushing harder iht RUE. Pt initially reporting pain in R arm d/t width of chair. OT selects 18x18 w/c with texturized handles for improved propulsion with RUE. Pt reports much more comfortable for propulsion with narrower chair. In dayroom, pt stands to play 3 rounds of mini golf with steady A for standing balance VC for slightly narrower stance and micro-bend in knees as pt has tendency to lock knees with posterior lean. Pt reporting dizziness with standing and vitals 110/80 sitting 107/69 standing. Pt propels w/c to outside courtyard with increased time and 1 rest break for BUE strengthening/endurance required for BADLs/community mobility. Exited session with pt seated in bed, call light in reach and RN checking blood sugar.  Therapy Documentation Precautions:  Precautions Precautions: Fall Restrictions Weight Bearing Restrictions: No General: General PT Missed Treatment Reason: Patient fatigue;Patient ill (Comment)(nausea/vomiting, missed 45 minutes of scheduled make up time)  See Function Navigator for Current Functional Status.   Therapy/Group: Individual Therapy  [name] 08/05/2017, 5:04 PM

## 2017-08-06 ENCOUNTER — Inpatient Hospital Stay (HOSPITAL_COMMUNITY): Payer: Medicare HMO | Admitting: Physical Therapy

## 2017-08-06 ENCOUNTER — Inpatient Hospital Stay (HOSPITAL_COMMUNITY): Payer: Medicare HMO | Admitting: Occupational Therapy

## 2017-08-06 ENCOUNTER — Inpatient Hospital Stay (HOSPITAL_COMMUNITY): Payer: Medicare HMO

## 2017-08-06 LAB — BASIC METABOLIC PANEL
ANION GAP: 9 (ref 5–15)
BUN: 29 mg/dL — ABNORMAL HIGH (ref 6–20)
CALCIUM: 9.6 mg/dL (ref 8.9–10.3)
CO2: 26 mmol/L (ref 22–32)
Chloride: 97 mmol/L — ABNORMAL LOW (ref 101–111)
Creatinine, Ser: 1.1 mg/dL (ref 0.61–1.24)
GFR calc non Af Amer: >60 mL/min (ref >60)
Glucose, Bld: 135 mg/dL — ABNORMAL HIGH (ref 65–99)
POTASSIUM: 3.8 mmol/L (ref 3.5–5.1)
Sodium: 132 mmol/L — ABNORMAL LOW (ref 135–145)

## 2017-08-06 LAB — CBC WITH DIFFERENTIAL/PLATELET
BASOS PCT: 0 %
Basophils Absolute: 0 10*3/uL (ref 0.0–0.1)
Eosinophils Absolute: 0.1 10*3/uL (ref 0.0–0.7)
Eosinophils Relative: 1 %
HEMATOCRIT: 45.4 % (ref 39.0–52.0)
Hemoglobin: 15.4 g/dL (ref 13.0–17.0)
LYMPHS PCT: 23 %
Lymphs Abs: 2.2 10*3/uL (ref 0.7–4.0)
MCH: 31.4 pg (ref 26.0–34.0)
MCHC: 33.9 g/dL (ref 30.0–36.0)
MCV: 92.7 fL (ref 78.0–100.0)
MONOS PCT: 8 %
Monocytes Absolute: 0.8 10*3/uL (ref 0.1–1.0)
NEUTROS ABS: 6.3 10*3/uL (ref 1.7–7.7)
NEUTROS PCT: 68 %
Platelets: 305 10*3/uL (ref 150–400)
RBC: 4.9 MIL/uL (ref 4.22–5.81)
RDW: 13 % (ref 11.5–15.5)
WBC: 9.4 10*3/uL (ref 4.0–10.5)

## 2017-08-06 LAB — GLUCOSE, CAPILLARY
Glucose-Capillary: 125 mg/dL — ABNORMAL HIGH (ref 65–99)
Glucose-Capillary: 148 mg/dL — ABNORMAL HIGH (ref 65–99)
Glucose-Capillary: 132 mg/dL — ABNORMAL HIGH (ref 65–99)
Glucose-Capillary: 91 mg/dL (ref 65–99)

## 2017-08-06 NOTE — Progress Notes (Signed)
Occupational Therapy Session Note  Patient Details  Name: [name] MRN: [id number] Date of Birth: [birth year]  Today's Date: 08/06/2017 OT Individual Time: 0626-9485 OT Individual Time Calculation (min): 59 min   Short Term Goals: Week 2:  OT Short Term Goal 1 (Week 2): Pt will complete bathing at sit > stand level with supervision OT Short Term Goal 2 (Week 2): Pt will complete LB dressing at sit > stand level with supervision OT Short Term Goal 3 (Week 2): Pt will complete toilet transfers with min assist with LRAD OT Short Term Goal 4 (Week 2): Pt will complete 2 grooming tasks in standing with supervision  Skilled Therapeutic Interventions/Progress Updates:    Pt greeted supine in bed. Requesting to have therapy in room due to fatigue. Tx focus on Rt NMR and cognitive remediation. Started session with preparatory activity of Rt hand stretches with MHP and instruction on exercise techniques. Skin intact pre/post moist heat application. Then had him complete 24 piece puzzle with max multimodal cues for self organization and problem solving. Max instruction to manipulate pieces in Rt hand for NMR instead of compensating with Lt. Next continued to work on in Licensed conveyancer with coins of various sizes, dimes being most challenging and often fell out of hand. When transferring 5 coins individually up to fingertips, pt unable to do so without dropping 1. He was encouraged by success at penny stacking with mod cues to lift coins vs. slide from table. Afterwards pt returned to supine and was left with all needs within reach.   Therapy Documentation Precautions:  Precautions Precautions: Fall Restrictions Weight Bearing Restrictions: No General: General PT Missed Treatment Reason: Patient fatigue;Patient unwilling to participate Vital Signs: Oxygen Therapy SpO2: 99 % O2 Device: Room Air Pain: No c/o pain during session  Pain Assessment Pain Score: 0-No pain ADL:  :    See Function Navigator for Current Functional Status.   Therapy/Group: Individual Therapy  Mohamad Bruso A Niyanna Asch 08/06/2017, 12:50 PM

## 2017-08-06 NOTE — Progress Notes (Signed)
Physical Therapy Note  Patient Details  Name: [name] MRN: [id number] Date of Birth: 22-Jul-1955 Today's Date: 08/06/2017    Time: 900-935 35 minutes  1:1 No c/o pain. Pt states he did not sleep last night and is extremely upset because he lost his phone, agreeable to treatment with encouragement. Pt performs gait 100' x 2 with RW and min A, occasional LOB due to ataxic gait and narrow BOS requiring min A to correct. Pt better able to keep feet apart during gait in quiet,controlled environments.  Side stepping for hip strengthening and coordination without RW with min A.  Gait training without RW x 50' with pt with improved Rt LE control noted, min/mod A.  Standing cone tap for coordination with min A and increased time but pt improving attention and concentration.  Pt then states he is "so tired and mad" and states he cannot participate any longer and needs to go back to the room.  Pt performs gait back to room with RW and min A., supervision for bed mobility.   [name] 08/06/2017, 9:37 AM

## 2017-08-06 NOTE — Progress Notes (Signed)
Physical Therapy Session Note  Patient Details  Name: [name] MRN: [id number] Date of Birth: Oct 17, 1955  Today's Date: 08/06/2017 PT Individual Time: 1445-1525 PT Individual Time Calculation (min): 40 min   Short Term Goals: Week 2:  PT Short Term Goal 1 (Week 2): pt will demo gait in home environment with min A x 25' PT Short Term Goal 2 (Week 2): pt will perform stair negotiation x 2 steps without handrails with min A  Skilled Therapeutic Interventions/Progress Updates:    Session focused on NMR to address dynamic balance, postural control, and ataxia during mobility. Functional transfers with min assist and verbal cues for safety and attention to R foot placement. Utilized limits of stability program on Bi[employer] to address the above with BUE needed and facilitation provided at pelvis for adequate weightshifting as well as verbal cues. Middle skill level initially on static surface and progressed to level 10 x 2 reps demonstrating improved coordination of lines and movement with repetition. NMR during gait with RW with focus on coordination and control in BLE (tendency for narrow BOS noted) with increased physical and verbal cues during turns due to unsafe foot placement and quick movements overall min assist. Returned back to bed end of session with steadying assist for transfer and supervision to return to supine. All needs in reach and bed alarm on. Telesitter in room.   Therapy Documentation Precautions:  Precautions Precautions: Fall Restrictions Weight Bearing Restrictions: No Pain:  Denies pain.   See Function Navigator for Current Functional Status.   Therapy/Group: Individual Therapy  [name], PT, DPT  08/06/2017, 3:27 PM

## 2017-08-06 NOTE — Progress Notes (Signed)
Occupational Therapy Session Note  Patient Details  Name: [name] MRN: [id number] Date of Birth: [birth year]  Today's Date: 08/06/2017 OT Individual Time: 1332-1400 OT Individual Time Calculation (min): 28 min    Skilled Therapeutic Interventions/Progress Updates:    Pt completed donning shoes sitting on the EOB with supervision to start session.  He then ambulated to the ortho gym with mod hand held assist and decreased coordination in the RLE.  In the gym had pt focus on RUE FM coordination task in sitting, having him pick up 1" pegs and place in peg board.  Noted slight ataxia when picking them up and placing them one at a time at chest height.  Next had pt work on in hand manipulation by picking 2 pegs up, one at a time, and then placing them into the grid.  Pt successful for 90% of attempts.  Once completed, had them remove them two at a time as well.  Finished session with ambulation back to the room at the previous level.  Pt left in wheelchair with call button in reach and safety belt and chair alarm in place.  Pt also with telesitter in place.  Encouraged pt to continue working on picking up small foam pieces on table and placing in cup as well.    Therapy Documentation Precautions:  Precautions Precautions: Fall Restrictions Weight Bearing Restrictions: No  Pain: Pain Assessment Pain Assessment: No/denies pain ADL: See Function Navigator for Current Functional Status.   Therapy/Group: Individual Therapy  Tonga Prout OTR/L 08/06/2017, 4:04 PM

## 2017-08-06 NOTE — Progress Notes (Signed)
[place] PHYSICAL MEDICINE & REHABILITATION     PROGRESS NOTE  Subjective/Complaints:  Pt seen lying in bed this AM.  He states he had a horrible night. He is extremely upset because his phone is missing and states his "whole life was on that phone".  ROS: Denies CP, SOB, nausea, vomiting, diarrhea.  Objective: Vital Signs: Blood pressure 117/74, pulse 98, temperature 98.4 F (36.9 C), temperature source Oral, resp. rate 18, height 5\' 7"  (1.702 m), weight 67.6 kg (149 lb 0.5 oz), SpO2 96 %. No results found. No results for input(s): WBC, HGB, HCT, PLT in the last 72 hours. No results for input(s): NA, K, CL, GLUCOSE, BUN, CREATININE, CALCIUM in the last 72 hours.  Invalid input(s): CO CBG (last 3)  Recent Labs    08/05/17 1648 08/05/17 2054 08/06/17 0645  GLUCAP 129* 102* 125*    Wt Readings from Last 3 Encounters:  07/27/17 67.6 kg (149 lb 0.5 oz)  07/22/17 66.8 kg (147 lb 4.3 oz)  06/28/17 68.9 kg (151 lb 12.8 oz)    Physical Exam:  BP 117/74   Pulse 98   Temp 98.4 F (36.9 C) (Oral)   Resp 18   Ht 5\' 7"  (1.702 m)   Wt 67.6 kg (149 lb 0.5 oz)   SpO2 96%   BMI 23.34 kg/m  Constitutional: He appearswell-developedand well-nourished. HENT: Normocephalicand atraumatic.  Eyes:EOMare normal. No discharge. Cardiovascular:RRR. No JVD. Respiratory:Effort normal and breath sounds normal.  GI: Bowel sounds are normal. Nondistended, nontender.  Musculoskeletal: He exhibits noedema, no tenderness.  Neurological: He isalertand oriented.  Motor: LUE/LLE: 4+/5 proximal to distal 4+/5 right delt bi tri grip 4/5 right HF 4/5 KE, 4+/5 R ankle DF (stable)  Skin: Skin iswarmand dry. He isnot diaphoretic.  Psychiatric: Upset. He expressesimpulsivityand inappropriate judgment.  Assessment/Plan: 1. Functional deficits secondary to left Basal ganglia and pontine infarcts which require 3+ hours per day of interdisciplinary therapy in a comprehensive inpatient  rehab setting. Physiatrist is providing close team supervision and 24 hour management of active medical problems listed below. Physiatrist and rehab team continue to assess barriers to discharge/monitor patient progress toward functional and medical goals.  Function:  Bathing Bathing position   Position: Shower  Bathing parts Body parts bathed by patient: Right arm, Chest, Abdomen, Front perineal area, Right upper leg, Left upper leg, Left arm, Buttocks, Right lower leg, Left lower leg Body parts bathed by helper: Back  Bathing assist Assist Level: Touching or steadying assistance(Pt > 75%)      Upper Body Dressing/Undressing Upper body dressing   What is the patient wearing?: Pull over shirt/dress     Pull over shirt/dress - Perfomed by patient: Thread/unthread right sleeve, Put head through opening, Thread/unthread left sleeve, Pull shirt over trunk Pull over shirt/dress - Perfomed by helper: Thread/unthread right sleeve        Upper body assist Assist Level: Touching or steadying assistance(Pt > 75%)      Lower Body Dressing/Undressing Lower body dressing   What is the patient wearing?: Pants, Non-skid slipper socks Underwear - Performed by patient: Thread/unthread right underwear leg, Thread/unthread left underwear leg, Pull underwear up/down   Pants- Performed by patient: Fasten/unfasten pants, Pull pants up/down, Thread/unthread left pants leg, Thread/unthread right pants leg Pants- Performed by helper: Thread/unthread right pants leg Non-skid slipper socks- Performed by patient: Don/doff right sock, Don/doff left sock Non-skid slipper socks- Performed by helper: Don/doff right sock, Don/doff left sock     Shoes - Performed by patient:  Don/doff right shoe, Don/doff left shoe(elastic laces)            Lower body assist Assist for lower body dressing: Touching or steadying assistance (Pt > 75%)      Toileting Toileting   Toileting steps completed by patient:  Adjust clothing prior to toileting, Performs perineal hygiene, Adjust clothing after toileting Toileting steps completed by helper: Performs perineal hygiene Toileting Assistive Devices: Grab bar or rail  Toileting assist Assist level: Touching or steadying assistance (Pt.75%)   Transfers Chair/bed transfer   Chair/bed transfer method: Stand pivot Chair/bed transfer assist level: Touching or steadying assistance (Pt > 75%) Chair/bed transfer assistive device: Armrests     Locomotion Ambulation     Max distance: 50 ft Assist level: Moderate assist (Pt 50 - 74%)   Wheelchair     Max wheelchair distance: 150 ft Assist Level: Supervision or verbal cues  Cognition Comprehension Comprehension assist level: Follows complex conversation/direction with extra time/assistive device  Expression Expression assist level: Expresses basic needs/ideas: With no assist  Social Interaction Social Interaction assist level: Interacts appropriately with others - No medications needed.  Problem Solving Problem solving assist level: Solves basic 90% of the time/requires cueing < 10% of the time  Memory Memory assist level: Recognizes or recalls 90% of the time/requires cueing < 10% of the time    Medical Problem List and Plan: 1. 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 DVT Prophylaxis/Anticoagulation: Pharmaceutical:Lovenox 3. Pain Management:N/A 4. Mood:LCSW to follow for evaluation and support. 5. Neuropsych: This patientis not fullycapable of making decisions on hisown behalf.   Continue tele-sitter 6.Rash/Skin/Wound Care:Continue Triamcinolone cream tid. Monitor rash for healing. Maintain adequate nutritional and hydration status. 7. Fluids/Electrolytes/Nutrition:Monitor I/O.  8. HTN: Monitor BP bid. On Prinivil, indapamide,   Vitals:   08/06/17 0500 08/06/17 0827  BP: 112/67 117/74  Pulse: 98   Resp: 18   Temp: 98.4 F  (36.9 C)   SpO2: 96%     Relatively controlled on 3/11 9. CAD:On ASA and lipitor. 10. H/o Hep C:No meds 11. COPD/Lung CA s/p XRT: On Breo and Ellipta daily. 12. Depression: On Wellbutrin XL. 13. Constipation: Increased MiraLAX to twice a day    Increased bowel reg on 3/5, again on 3/6   KUB reviewed, relatively unremarkable, but was taken and patient had a good bowel movement.   UA/U culture relatively unremarkable   Improving  14. Itching/rash: Due to recent bed bug infestation. Added Sarna lotion. 15. Prediabetes   Cont to monitor   CBGs ordered   SSI ordered, added qhs SSI   Diet changed to Carb modified   Slightly labile on 3/11   Will start Metformin pending labs 16. Hyponatremia   Na 135 on 3/7   Labs pending   Cont to monitor 17. AKI   Cr 0.92 on 3/7 after IVF   Labs pending   Encourage fluids   Continue to monitor   IVF [place] on 3/7  LOS (Days) 11 A FACE TO FACE EVALUATION WAS PERFORMED  Bleu Moisan [name] 08/06/2017 8:46 AM

## 2017-08-07 ENCOUNTER — Inpatient Hospital Stay (HOSPITAL_COMMUNITY): Payer: Medicare HMO | Admitting: Physical Therapy

## 2017-08-07 ENCOUNTER — Inpatient Hospital Stay (HOSPITAL_COMMUNITY): Payer: Medicare HMO | Admitting: Occupational Therapy

## 2017-08-07 LAB — GLUCOSE, CAPILLARY
GLUCOSE-CAPILLARY: 114 mg/dL — AB (ref 65–99)
GLUCOSE-CAPILLARY: 122 mg/dL — AB (ref 65–99)
Glucose-Capillary: 98 mg/dL (ref 65–99)
Glucose-Capillary: 108 mg/dL — ABNORMAL HIGH (ref 65–99)

## 2017-08-07 MED ORDER — METFORMIN HCL 500 MG PO TABS
250.0000 mg | ORAL_TABLET | Freq: Every day | ORAL | Status: DC
  Administered 2017-08-08 – 2017-08-09 (×2): 250 mg via ORAL
  Filled 2017-08-07 (×2): qty 1

## 2017-08-07 NOTE — Progress Notes (Signed)
[place] PHYSICAL MEDICINE & REHABILITATION     PROGRESS NOTE  Subjective/Complaints:  Pt seen sitting up at the EOB this AM.  He states he slept well overnight.  He states he is still unable to locate his phone.   ROS: Denies CP, SOB, nausea, vomiting, diarrhea.  Objective: Vital Signs: Blood pressure (!) 101/57, pulse 92, temperature 97.6 F (36.4 C), temperature source Oral, resp. rate 18, height 5\' 7"  (1.702 m), weight 67.6 kg (149 lb 0.5 oz), SpO2 98 %. No results found. Recent Labs    08/06/17 0838  WBC 9.4  HGB 15.4  HCT 45.4  PLT 305   Recent Labs    08/06/17 0838  NA 132*  K 3.8  CL 97*  GLUCOSE 135*  BUN 29*  CREATININE 1.10  CALCIUM 9.6   CBG (last 3)  Recent Labs    08/06/17 1629 08/06/17 2152 08/07/17 0643  GLUCAP 132* 91 114*    Wt Readings from Last 3 Encounters:  07/27/17 67.6 kg (149 lb 0.5 oz)  07/22/17 66.8 kg (147 lb 4.3 oz)  06/28/17 68.9 kg (151 lb 12.8 oz)    Physical Exam:  BP (!) 101/57   Pulse 92   Temp 97.6 F (36.4 C) (Oral)   Resp 18   Ht 5\' 7"  (1.702 m)   Wt 67.6 kg (149 lb 0.5 oz)   SpO2 98%   BMI 23.34 kg/m  Constitutional: He appearswell-developedand well-nourished. HENT: Normocephalicand atraumatic.  Eyes:EOMare normal. No discharge. Cardiovascular:RRR. No JVD. Respiratory:Effort normal and breath sounds normal.  GI: Bowel sounds are normal. Nondistended, nontender.  Musculoskeletal: He exhibits noedema, no tenderness.  Neurological: He isalertand oriented.  Motor: LUE/LLE: 4+/5 proximal to distal 4+/5 right delt bi tri grip 4/5 right HF 4/5 KE, 4+/5 R ankle DF (unchanged)  Skin: Skin iswarmand dry. He isnot diaphoretic.  Psychiatric: Upset. He expressesimpulsivityand inappropriate judgment.  Assessment/Plan: 1. Functional deficits secondary to left Basal ganglia and pontine infarcts which require 3+ hours per day of interdisciplinary therapy in a comprehensive inpatient rehab  setting. Physiatrist is providing close team supervision and 24 hour management of active medical problems listed below. Physiatrist and rehab team continue to assess barriers to discharge/monitor patient progress toward functional and medical goals.  Function:  Bathing Bathing position   Position: Shower  Bathing parts Body parts bathed by patient: Right arm, Chest, Abdomen, Front perineal area, Right upper leg, Left upper leg, Left arm, Buttocks, Right lower leg, Left lower leg Body parts bathed by helper: Back  Bathing assist Assist Level: Touching or steadying assistance(Pt > 75%)      Upper Body Dressing/Undressing Upper body dressing   What is the patient wearing?: Pull over shirt/dress     Pull over shirt/dress - Perfomed by patient: Thread/unthread right sleeve, Put head through opening, Thread/unthread left sleeve, Pull shirt over trunk Pull over shirt/dress - Perfomed by helper: Thread/unthread right sleeve        Upper body assist Assist Level: Touching or steadying assistance(Pt > 75%)      Lower Body Dressing/Undressing Lower body dressing   What is the patient wearing?: Pants, Non-skid slipper socks Underwear - Performed by patient: Thread/unthread right underwear leg, Thread/unthread left underwear leg, Pull underwear up/down   Pants- Performed by patient: Fasten/unfasten pants, Pull pants up/down, Thread/unthread left pants leg, Thread/unthread right pants leg Pants- Performed by helper: Thread/unthread right pants leg Non-skid slipper socks- Performed by patient: Don/doff right sock, Don/doff left sock Non-skid slipper socks- Performed by helper:  Don/doff right sock, Don/doff left sock     Shoes - Performed by patient: Don/doff right shoe, Don/doff left shoe(elastic laces)            Lower body assist Assist for lower body dressing: Touching or steadying assistance (Pt > 75%)      Toileting Toileting   Toileting steps completed by patient: Adjust  clothing prior to toileting, Performs perineal hygiene, Adjust clothing after toileting Toileting steps completed by helper: Performs perineal hygiene Toileting Assistive Devices: Grab bar or rail  Toileting assist Assist level: Touching or steadying assistance (Pt.75%)   Transfers Chair/bed transfer   Chair/bed transfer method: Ambulatory Chair/bed transfer assist level: Touching or steadying assistance (Pt > 75%) Chair/bed transfer assistive device: Armrests     Locomotion Ambulation     Max distance: 86' Assist level: Moderate assist (Pt 50 - 74%)   Wheelchair   Type: Manual Max wheelchair distance: 250' Assist Level: Supervision or verbal cues  Cognition Comprehension Comprehension assist level: Follows complex conversation/direction with extra time/assistive device  Expression Expression assist level: Expresses basic needs/ideas: With no assist  Social Interaction Social Interaction assist level: Interacts appropriately with others - No medications needed.  Problem Solving Problem solving assist level: Solves basic 90% of the time/requires cueing < 10% of the time  Memory Memory assist level: Recognizes or recalls 90% of the time/requires cueing < 10% of the time    Medical Problem List and Plan: 1. 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 DVT Prophylaxis/Anticoagulation: Pharmaceutical:Lovenox 3. Pain Management:N/A 4. Mood:LCSW to follow for evaluation and support. 5. Neuropsych: This patientis not fullycapable of making decisions on hisown behalf.   Continue tele-sitter 6.Rash/Skin/Wound Care:Continue Triamcinolone cream tid. Monitor rash for healing. Maintain adequate nutritional and hydration status. 7. Fluids/Electrolytes/Nutrition:Monitor I/O.  8. HTN: Monitor BP bid. On Prinivil, indapamide,   Vitals:   08/07/17 0838 08/07/17 0957  BP:  (!) 101/57  Pulse: 92   Resp: 18   Temp:    SpO2:       Relatively controlled on 3/12 9. CAD:On ASA and lipitor. 10. H/o Hep C:No meds 11. COPD/Lung CA s/p XRT: On Breo and Ellipta daily. 12. Depression: On Wellbutrin XL. 13. Constipation: Increased MiraLAX to twice a day    Increased bowel reg on 3/5, again on 3/6   KUB reviewed, relatively unremarkable, but was taken and patient had a good bowel movement.   UA/U culture relatively unremarkable   Improving  14. Itching/rash: Due to recent bed bug infestation. Added Sarna lotion. 15. Prediabetes   Cont to monitor   CBGs ordered   SSI ordered, added qhs SSI   Diet changed to Carb modified   Slightly labile on 3/12   Metformin 250 started on 3/12 16. Hyponatremia   Na 132 on 3/11   Cont to monitor 17. AKI   Cr 1.10 on 3/11   Encourage fluids   Continue to monitor   IVF [place] on 3/7  LOS (Days) 12 A FACE TO FACE EVALUATION WAS PERFORMED  Ankit [name] 08/07/2017 10:15 AM

## 2017-08-07 NOTE — Progress Notes (Signed)
Occupational Therapy Note  Patient Details  Name: [name] MRN: [id number] Date of Birth: 1955/06/27  Today's Date: 08/07/2017 OT Individual Time: 1005-1020 OT Individual Time Calculation (min): 15 min  and Today's Date: 08/07/2017 OT Missed Time: 20 Minutes Missed Time Reason: Patient fatigue;Patient ill (comment)  Pt missed 45 mins scheduled OT treatment session due to reports of not sleeping last night and his stomach hurting. Pt asleep upon arrival, requiring increased time to arouse. Pt reports not sleeping well over night and his stomach hurting and requesting to return to sleep. Provided encouragement to engage in self-care tasks or even just up to toilet with pt refusing.  Educated on purpose of therapy, with pt stating that he can't do it right now as he did not sleep well and just wanted to return to sleep.  Notified RN of reports of no sleep last night, no report regarding sleep from night shift.  [name] 08/07/2017, 10:36 AM

## 2017-08-07 NOTE — Progress Notes (Signed)
Occupational Therapy Session Note  Patient Details  Name: [name] MRN: [id number] Date of Birth: 1955-11-13  Today's Date: 08/07/2017 OT Individual Time: 1345-1455 OT Individual Time Calculation (min): 70 min    Short Term Goals: Week 2:  OT Short Term Goal 1 (Week 2): Pt will complete bathing at sit > stand level with supervision OT Short Term Goal 2 (Week 2): Pt will complete LB dressing at sit > stand level with supervision OT Short Term Goal 3 (Week 2): Pt will complete toilet transfers with min assist with LRAD OT Short Term Goal 4 (Week 2): Pt will complete 2 grooming tasks in standing with supervision  Skilled Therapeutic Interventions/Progress Updates:    Pt seen for OT session focusing on functional ambulation, standing balance, and ADL-retraining. Pt asleep in supine upon arrival, easily awoken and agreeable to tx session. He ambulated throughout session using RW with overall min A, VCs to lengthen BOS and less reliance on UEs.  In therapy gym, completed dynamic sitting and standing activities from EOM completing ball toss. Min A- occasional mod A required for dynamic balance with pt maintaining R UE support on RW during standing tasks. Pt tolerating ~1 minute in standing before requiring seated rest break.  Pt stated he desired to shower, however, did not feel comfortable completing task secondary to decreased fine motor abilities and control. He ambulated back to room in same manner as described above. Pt able to complete set-up of grooming task, applying shaving cream and opening devices. Shaving completed total A for safety.  He completed oral care in standing position, leaning into sink ledge for balance. Min A stand pivot to return to bed at end of session, all needs in reach and bed alarm on.   Throughout session, pt required VCs for safety awareness secondary to impulsivity.   Therapy Documentation Precautions:  Precautions Precautions: Fall Restrictions Weight  Bearing Restrictions: No Pain:   No/denies pain  See Function Navigator for Current Functional Status.   Therapy/Group: Individual Therapy  [name] 08/07/2017, 7:25 AM

## 2017-08-07 NOTE — Progress Notes (Signed)
Physical Therapy Note  Patient Details  Name: Gaspare Netzel MRN: [id number] Date of Birth: 1955-06-20 Today's Date: 08/07/2017    Time: 830-924 54 minutes  1:1 Pt c/o "my stomach hurts", pt refuses multiple offers of toileting, states he had a BM during the night.  Pt performs bed mobility with supervision.  Able to don shoes and socks with supervision.  Gait to/from gym with RW and min A, cues to keep wide BOS.  Gait side stepping with RW with min A with focus on hip control.  Gait training in busy environment with pt with more toe drag on Rt when distracted by environment.  Step ups forward and laterally with focus on hip control and eccentric control with min/mod A for balance.  Seated peg task with Rt UE with pt able to grasp pegs from ziploc bag and place in board with increased time and supervision.  Stair negotiation 4 stairs x 2 with bilat handrails with min A, cues for attention to foot placement.  Pt left in bed with needs at hand, alarm on.   Ariatna Jester 08/07/2017, 9:25 AM

## 2017-08-08 ENCOUNTER — Inpatient Hospital Stay (HOSPITAL_COMMUNITY): Payer: Medicare HMO | Admitting: Occupational Therapy

## 2017-08-08 ENCOUNTER — Inpatient Hospital Stay (HOSPITAL_COMMUNITY): Payer: Medicare HMO

## 2017-08-08 ENCOUNTER — Inpatient Hospital Stay (HOSPITAL_COMMUNITY): Payer: Medicare HMO | Admitting: Physical Therapy

## 2017-08-08 ENCOUNTER — Inpatient Hospital Stay (HOSPITAL_COMMUNITY): Payer: Medicare HMO | Admitting: *Deleted

## 2017-08-08 LAB — GLUCOSE, CAPILLARY
GLUCOSE-CAPILLARY: 126 mg/dL — AB (ref 65–99)
Glucose-Capillary: 173 mg/dL — ABNORMAL HIGH (ref 65–99)
Glucose-Capillary: 155 mg/dL — ABNORMAL HIGH (ref 65–99)
Glucose-Capillary: 113 mg/dL — ABNORMAL HIGH (ref 65–99)

## 2017-08-08 NOTE — Progress Notes (Signed)
Physical Therapy Note  Patient Details  Name: [name] MRN: [id number] Date of Birth: 04/12/1956 Today's Date: 08/08/2017  0900-0930, 30 min individual tx Pain: none per pt  Bed mobility with min cues for techniques, supervision.  Min assist stand pivot transfer bed> w/c to R; pt with LOB backwards due to moving too quickly.    neuromuscular re-education via demo, multimodal cues for standing ex with bil UE support:  Calf/toe raises, mini squats with manual assistance from PT prevent hyperextension R knee, R/L hip abduction focusing on eccentric control, R hamstring curls, x 10 each.    Gait training on level tile x 50' with mulitple turns, to return to room.    Pt left resting in w/c with quick release belt applied and all needs within reach.  See function navigator for current status.  Taran Hable 08/08/2017, 8:49 AM

## 2017-08-08 NOTE — Progress Notes (Signed)
Orthopedic Tech Progress Note Patient Details:  [name] [birth year] [id number] Called [employer]anger for brace. Patient ID: [name], male   DOB: 10/26/1955, 62 y.o.   MRN: [id number]   Braulio Bosch 08/08/2017, 2:58 PM

## 2017-08-08 NOTE — Progress Notes (Signed)
Physical Therapy Session Note  Patient Details  Name: [name] MRN: [id number] Date of Birth: 29-Sep-1955  Today's Date: 08/08/2017 PT Individual Time: 1500-1600 PT Individual Time Calculation (min): 60 min   Short Term Goals: Week 1:  PT Short Term Goal 1 (Week 1): pt will consistently perform functional transfers with mod A PT Short Term Goal 1 - Progress (Week 1): Met PT Short Term Goal 2 (Week 1): pt will gait in controlled environment x 50' with min A PT Short Term Goal 2 - Progress (Week 1): Met Week 2:  PT Short Term Goal 1 (Week 2): pt will demo gait in home environment with min A x 25' PT Short Term Goal 2 (Week 2): pt will perform stair negotiation x 2 steps without handrails with min A  Skilled Therapeutic Interventions/Progress Updates: \   Pt received supine in bed and agreeable to PT. Supine>sit transfer with supervision assist and min cues for safety at EOB. Pt donned shoes with supervision assist sitting EOB.   Gait training 132f, 1424fand 18027fith RW and min assist from PT with cues for increased step width.   Dynamic standing balance Dynavision; program A on level surface RUE x 3 (score 20, 20, 27) and LUE x 2 (score 27 and 29) Standing on airex Pad RUE x 2. (score 22 and 24)  PT provided min assist while standing on airex pad as well as min cues for improved attention to the L.   Nustep reciprocal movement trainnig BUE and BLE x 8 minutes with supervision assist and min cues for proper speed and ROM to increase symmetry of movement.   Patient returned to room and [facility] in reach and all needs met.        Therapy Documentation Precautions:  Precautions Precautions: Fall Restrictions Weight Bearing Restrictions: No General:   Vital Signs: Therapy Vitals Temp: 97.6 F (36.4 C) Temp Source: Oral Pulse Rate: (!) 101 Resp: 18 BP: 109/69 Patient Position (if appropriate): Lying Oxygen Therapy SpO2: 100 % O2 Device:  Room Air Pain:   Mobility:   Locomotion :    Trunk/Postural Assessment :    Balance:   Exercises:   Other Treatments:     See Function Navigator for Current Functional Status.   Therapy/Group: Individual Therapy  AusLorie Phenix13/2019, 5:24 PM

## 2017-08-08 NOTE — Progress Notes (Signed)
Occupational Therapy Session Note  Patient Details  Name: [name] MRN: [id number] Date of Birth: [birth year]  Today's Date: 08/08/2017 OT Individual Time: 0930-1030 OT Individual Time Calculation (min): 60 min    Short Term Goals: Week 2:  OT Short Term Goal 1 (Week 2): Pt will complete bathing at sit > stand level with supervision OT Short Term Goal 2 (Week 2): Pt will complete LB dressing at sit > stand level with supervision OT Short Term Goal 3 (Week 2): Pt will complete toilet transfers with min assist with LRAD OT Short Term Goal 4 (Week 2): Pt will complete 2 grooming tasks in standing with supervision  Skilled Therapeutic Interventions/Progress Updates:    Treatment session with focus on self-care retraining and functional mobility.  Pt on phone upon arrival and upset, however able to be redirected to participate in therapy session.  Pt completed bed mobility with supervision.  Ambulated to room shower with RW with min assist.  Engaged in bathing at sit > stand level with intermittent min assist to supervision with improved awareness of RLE placement during sit > stand with initial cue.  Completed dressing at sit > stand level with pt donning pants with min guard due to quick movements.  Ambulated to sink with RW and min assist, engaged in grooming tasks in standing at sink with supervision.  Pt continues to demonstrate decreased control with dominant RUE during grooming tasks however with increased time pt is able to complete tasks.  Returned to bed at end of session and left semi-reclined in bed with all needs in reach.  Therapy Documentation Precautions:  Precautions Precautions: Fall Restrictions Weight Bearing Restrictions: No General:   Vital Signs: Oxygen Therapy SpO2: 98 % O2 Device: Room Air Pain: Pain Assessment Pain Assessment: No/denies pain Pain Score: 0-No pain  See Function Navigator for Current Functional Status.   Therapy/Group: Individual  Therapy  [name] 08/08/2017, 10:41 AM

## 2017-08-08 NOTE — Progress Notes (Signed)
Recreational Therapy Session Note  Patient Details  Name: [name] MRN: [id number] Date of Birth: 1956-01-03 Today's Date: 08/08/2017  Pain: no c/o Skilled Therapeutic Interventions/Progress Updates: Session focused on activity tolerance, dynamic standing balance, standing at midline, BUE/LE coordination/ use during co-treat with PT.  Activities included ball toss, ball tapping in standing and in seated- kicking a ball alternating LEs and then alternating between tossing and kicking.  Pt required min - mod assist for for standing and supervision- min assist seated.  Therapy/Group: Co-Treatment  Tandy Grawe 08/08/2017, 4:28 PM

## 2017-08-08 NOTE — Progress Notes (Signed)
Physical Therapy Session Note  Patient Details  Name: [name] MRN: [id number] Date of Birth: Oct 07, 1955  Today's Date: 08/08/2017 PT Individual Time: 1100-1200 PT Individual Time Calculation (min): 60 min   Short Term Goals: Week 2:  PT Short Term Goal 1 (Week 2): pt will demo gait in home environment with min A x 25' PT Short Term Goal 2 (Week 2): pt will perform stair negotiation x 2 steps without handrails with min A  Skilled Therapeutic Interventions/Progress Updates:    Pt supine in bed, agreeable to participate in therapy session. Pt reports no pain this AM but some soreness in B legs. Bed mobility SBA. Sit to stand with min assist to RW. Ambulation 2 x 150 ft with RW and min assist, ataxic and scissoring gait pattern. Ambulation fwd/back in // bars with min assist and BUE support, focus on upright posture and widening BOS, visual cues to widen BOS. Standing balance activities: bean bag toss with no UE support and engaging in cognitive tasks while performing, min assist for balance; standing ball toss with mod assist due to significant lean to the R; ball toss with L side supported by wall and min assist for balance; ball toss with L side close to wall but unsupported and min assist for balance; side-steps with BUE support from rail in hallway and min assist for balance. Seated balance with alternating ball kick and ball toss with focus on decreasing speed and improving focus on task to improve accuracy as well as sitting balance. Pt requires minor v/c for safety to not reach for ball if he is not balanced. Recreational therapist present throughout therapy session. Pt left seated in bed at end of session set up for lunch, needs in reach, bed alarm in place, telesitter present.  Therapy Documentation Precautions:  Precautions Precautions: Fall Restrictions Weight Bearing Restrictions: No  See Function Navigator for Current Functional Status.   Therapy/Group: Individual  Therapy  Excell Seltzer, PT, DPT  08/08/2017, 4:12 PM

## 2017-08-08 NOTE — Progress Notes (Signed)
[place] PHYSICAL MEDICINE & REHABILITATION     PROGRESS NOTE  Subjective/Complaints:  Pt seen lying in bed this AM.  He slept well overnight.  He denies complaints.   ROS: Denies CP, SOB, nausea, vomiting, diarrhea.  Objective: Vital Signs: Blood pressure 101/61, pulse 89, temperature 98.4 F (36.9 C), temperature source Oral, resp. rate 18, height 5\' 7"  (1.702 m), weight 61.2 kg (134 lb 14.7 oz), SpO2 100 %. No results found. Recent Labs    08/06/17 0838  WBC 9.4  HGB 15.4  HCT 45.4  PLT 305   Recent Labs    08/06/17 0838  NA 132*  K 3.8  CL 97*  GLUCOSE 135*  BUN 29*  CREATININE 1.10  CALCIUM 9.6   CBG (last 3)  Recent Labs    08/07/17 1703 08/07/17 2139 08/08/17 0707  GLUCAP 122* 108* 126*    Wt Readings from Last 3 Encounters:  08/08/17 61.2 kg (134 lb 14.7 oz)  07/22/17 66.8 kg (147 lb 4.3 oz)  06/28/17 68.9 kg (151 lb 12.8 oz)    Physical Exam:  BP 101/61 (BP Location: Left Arm)   Pulse 89   Temp 98.4 F (36.9 C) (Oral)   Resp 18   Ht 5\' 7"  (1.702 m)   Wt 61.2 kg (134 lb 14.7 oz)   SpO2 100%   BMI 21.13 kg/m  Constitutional: He appearswell-developedand well-nourished. HENT: Normocephalicand atraumatic.  Eyes:EOMare normal. No discharge. Cardiovascular: RRR. No JVD. Respiratory:Effort normal and breath sounds normal.  GI: Bowel sounds are normal. Nondistended, nontender.  Musculoskeletal: He exhibits noedema, no tenderness.  Neurological: He isalertand oriented.  Motor: LUE/LLE: 4+/5 proximal to distal 4+/5 right delt bi tri grip 4/5 right HF 4/5 KE, 4+/5 R ankle DF (stable)  Skin: Skin iswarmand dry. He isnot diaphoretic.  Psychiatric: He expressesimpulsivityand inappropriate judgment.  Assessment/Plan: 1. Functional deficits secondary to left Basal ganglia and pontine infarcts which require 3+ hours per day of interdisciplinary therapy in a comprehensive inpatient rehab setting. Physiatrist is providing close team  supervision and 24 hour management of active medical problems listed below. Physiatrist and rehab team continue to assess barriers to discharge/monitor patient progress toward functional and medical goals.  Function:  Bathing Bathing position   Position: Shower  Bathing parts Body parts bathed by patient: Right arm, Chest, Abdomen, Front perineal area, Right upper leg, Left upper leg, Left arm, Buttocks, Right lower leg, Left lower leg Body parts bathed by helper: Back  Bathing assist Assist Level: Touching or steadying assistance(Pt > 75%)      Upper Body Dressing/Undressing Upper body dressing   What is the patient wearing?: Pull over shirt/dress     Pull over shirt/dress - Perfomed by patient: Thread/unthread right sleeve, Put head through opening, Thread/unthread left sleeve, Pull shirt over trunk Pull over shirt/dress - Perfomed by helper: Thread/unthread right sleeve        Upper body assist Assist Level: Touching or steadying assistance(Pt > 75%)      Lower Body Dressing/Undressing Lower body dressing   What is the patient wearing?: Pants, Non-skid slipper socks Underwear - Performed by patient: Thread/unthread right underwear leg, Thread/unthread left underwear leg, Pull underwear up/down   Pants- Performed by patient: Fasten/unfasten pants, Pull pants up/down, Thread/unthread left pants leg, Thread/unthread right pants leg Pants- Performed by helper: Thread/unthread right pants leg Non-skid slipper socks- Performed by patient: Don/doff right sock, Don/doff left sock Non-skid slipper socks- Performed by helper: Don/doff right sock, Don/doff left sock  Shoes - Performed by patient: Don/doff right shoe, Don/doff left shoe(elastic laces)            Lower body assist Assist for lower body dressing: Touching or steadying assistance (Pt > 75%)      Toileting Toileting   Toileting steps completed by patient: Adjust clothing prior to toileting, Performs perineal  hygiene, Adjust clothing after toileting Toileting steps completed by helper: Performs perineal hygiene Toileting Assistive Devices: Grab bar or rail  Toileting assist Assist level: Touching or steadying assistance (Pt.75%)   Transfers Chair/bed transfer   Chair/bed transfer method: Ambulatory Chair/bed transfer assist level: Touching or steadying assistance (Pt > 75%) Chair/bed transfer assistive device: Armrests     Locomotion Ambulation     Max distance: 20 Assist level: Moderate assist (Pt 50 - 74%)   Wheelchair   Type: Manual Max wheelchair distance: 250' Assist Level: Supervision or verbal cues  Cognition Comprehension Comprehension assist level: Follows complex conversation/direction with extra time/assistive device  Expression Expression assist level: Expresses basic needs/ideas: With no assist  Social Interaction Social Interaction assist level: Interacts appropriately with others - No medications needed.  Problem Solving Problem solving assist level: Solves basic 90% of the time/requires cueing < 10% of the time  Memory Memory assist level: Recognizes or recalls 90% of the time/requires cueing < 10% of the time    Medical Problem List and Plan: 1. 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 DVT Prophylaxis/Anticoagulation: Pharmaceutical:Lovenox 3. Pain Management:N/A 4. Mood:LCSW to follow for evaluation and support. 5. Neuropsych: This patientis not fullycapable of making decisions on hisown behalf.   Continue tele-sitter 6.Rash/Skin/Wound Care:Continue Triamcinolone cream tid. Monitor rash for healing. Maintain adequate nutritional and hydration status. 7. Fluids/Electrolytes/Nutrition:Monitor I/O.  8. HTN: Monitor BP bid. On Prinivil, indapamide,   Vitals:   08/07/17 1500 08/08/17 0500  BP: 115/68 101/61  Pulse: 88 89  Resp: 16 18  Temp: (!) 97.3 F (36.3 C) 98.4 F (36.9 C)  SpO2: 100%  100%    Relatively controlled on 3/13 9. CAD:On ASA and lipitor. 10. H/o Hep C:No meds 11. COPD/Lung CA s/p XRT: On Breo and Ellipta daily. 12. Depression: On Wellbutrin XL. 13. Constipation: Increased MiraLAX to twice a day    Increased bowel reg on 3/5, again on 3/6   KUB reviewed, relatively unremarkable, but was taken and patient had a good bowel movement.   UA/U culture relatively unremarkable   Improving  14. Itching/rash: Due to recent bed bug infestation. Added Sarna lotion. 15. Prediabetes   Cont to monitor   CBGs   SSI ordered, added qhs SSI   Diet changed to Carb modified   Metformin 250 started on 3/12  Relatively controlled on 3/13 16. Hyponatremia   Na 132 on 3/11  Will order labs end of the week   Cont to monitor 17. AKI   Cr 1.10 on 3/11   Encourage fluids   Continue to monitor   IVF [place] on 3/7  LOS (Days) 13 A FACE TO FACE EVALUATION WAS PERFORMED  [name] [name] 08/08/2017 8:43 AM

## 2017-08-09 ENCOUNTER — Inpatient Hospital Stay (HOSPITAL_COMMUNITY): Payer: Medicare HMO | Admitting: Physical Therapy

## 2017-08-09 ENCOUNTER — Inpatient Hospital Stay (HOSPITAL_COMMUNITY): Payer: Medicare HMO | Admitting: Occupational Therapy

## 2017-08-09 ENCOUNTER — Inpatient Hospital Stay (HOSPITAL_COMMUNITY): Payer: Medicare HMO

## 2017-08-09 LAB — BASIC METABOLIC PANEL
ANION GAP: 9 (ref 5–15)
BUN: 26 mg/dL — ABNORMAL HIGH (ref 6–20)
CHLORIDE: 99 mmol/L — AB (ref 101–111)
CO2: 25 mmol/L (ref 22–32)
Calcium: 9.2 mg/dL (ref 8.9–10.3)
Creatinine, Ser: 0.98 mg/dL (ref 0.61–1.24)
GFR calc Af Amer: >60 mL/min (ref >60)
GFR calc non Af Amer: >60 mL/min (ref >60)
GLUCOSE: 112 mg/dL — AB (ref 65–99)
POTASSIUM: 3.9 mmol/L (ref 3.5–5.1)
Sodium: 133 mmol/L — ABNORMAL LOW (ref 135–145)

## 2017-08-09 LAB — CBC
HEMATOCRIT: 40.6 % (ref 39.0–52.0)
Hemoglobin: 13.9 g/dL (ref 13.0–17.0)
MCH: 31.4 pg (ref 26.0–34.0)
MCHC: 34.2 g/dL (ref 30.0–36.0)
MCV: 91.9 fL (ref 78.0–100.0)
Platelets: 229 10*3/uL (ref 150–400)
RBC: 4.42 MIL/uL (ref 4.22–5.81)
RDW: 12.9 % (ref 11.5–15.5)
WBC: 7.7 10*3/uL (ref 4.0–10.5)

## 2017-08-09 LAB — GLUCOSE, CAPILLARY
GLUCOSE-CAPILLARY: 109 mg/dL — AB (ref 65–99)
GLUCOSE-CAPILLARY: 130 mg/dL — AB (ref 65–99)
Glucose-Capillary: 114 mg/dL — ABNORMAL HIGH (ref 65–99)
Glucose-Capillary: 97 mg/dL (ref 65–99)

## 2017-08-09 MED ORDER — METFORMIN HCL 500 MG PO TABS
500.0000 mg | ORAL_TABLET | Freq: Every day | ORAL | Status: DC
Start: 2017-08-10 — End: 2017-08-14
  Administered 2017-08-10 – 2017-08-14 (×5): 500 mg via ORAL
  Filled 2017-08-09 (×5): qty 1

## 2017-08-09 MED ORDER — METFORMIN HCL 500 MG PO TABS
250.0000 mg | ORAL_TABLET | Freq: Once | ORAL | Status: AC
  Administered 2017-08-09: 250 mg via ORAL
  Filled 2017-08-09: qty 1

## 2017-08-09 NOTE — Progress Notes (Signed)
[place] PHYSICAL MEDICINE & REHABILITATION     PROGRESS NOTE  Subjective/Complaints:  Patient seen sitting up at the edge of his bed this morning eating breakfast. He states he slept well overnight. He appears more calm today.  ROS: Denies CP, SOB, nausea, vomiting, diarrhea.  Objective: Vital Signs: Blood pressure 112/69, pulse 88, temperature 98.4 F (36.9 C), temperature source Oral, resp. rate 18, height 5\' 7"  (1.702 m), weight 61.2 kg (134 lb 14.7 oz), SpO2 97 %. No results found. Recent Labs    08/09/17 0647  WBC 7.7  HGB 13.9  HCT 40.6  PLT 229   Recent Labs    08/09/17 0647  NA 133*  K 3.9  CL 99*  GLUCOSE 112*  BUN 26*  CREATININE 0.98  CALCIUM 9.2   CBG (last 3)  Recent Labs    08/08/17 1646 08/08/17 2031 08/09/17 0657  GLUCAP 155* 113* 114*    Wt Readings from Last 3 Encounters:  08/08/17 61.2 kg (134 lb 14.7 oz)  07/22/17 66.8 kg (147 lb 4.3 oz)  06/28/17 68.9 kg (151 lb 12.8 oz)    Physical Exam:  BP 112/69 (BP Location: Left Arm)   Pulse 88   Temp 98.4 F (36.9 C) (Oral)   Resp 18   Ht 5\' 7"  (1.702 m)   Wt 61.2 kg (134 lb 14.7 oz)   SpO2 97%   BMI 21.13 kg/m  Constitutional: He appearswell-developedand well-nourished. HENT: Normocephalicand atraumatic.  Eyes:EOMare normal. No discharge. Cardiovascular: RRR. No JVD. Respiratory:Effort normal and breath sounds normal.  GI: Bowel sounds are normal. Nondistended, nontender.  Musculoskeletal: He exhibits noedema, no tenderness.  Neurological: He isalertand oriented.  Motor: LUE/LLE: 4+/5 proximal to distal 4+/5 right delt bi tri grip 4/5 right HF 4/5 KE, 4+/5 R ankle DF (unchanged)  Skin: Skin iswarmand dry. He isnot diaphoretic.  Psychiatric: He expressesimpulsivity (appears to be improving)and inappropriate judgment.  Assessment/Plan: 1. Functional deficits secondary to left Basal ganglia and pontine infarcts which require 3+ hours per day of interdisciplinary  therapy in a comprehensive inpatient rehab setting. Physiatrist is providing close team supervision and 24 hour management of active medical problems listed below. Physiatrist and rehab team continue to assess barriers to discharge/monitor patient progress toward functional and medical goals.  Function:  Bathing Bathing position   Position: Shower  Bathing parts Body parts bathed by patient: Right arm, Chest, Abdomen, Front perineal area, Right upper leg, Left upper leg, Left arm, Buttocks, Right lower leg, Left lower leg Body parts bathed by helper: Back  Bathing assist Assist Level: Touching or steadying assistance(Pt > 75%)      Upper Body Dressing/Undressing Upper body dressing   What is the patient wearing?: Pull over shirt/dress     Pull over shirt/dress - Perfomed by patient: Thread/unthread right sleeve, Put head through opening, Thread/unthread left sleeve, Pull shirt over trunk Pull over shirt/dress - Perfomed by helper: Thread/unthread right sleeve        Upper body assist Assist Level: Supervision or verbal cues      Lower Body Dressing/Undressing Lower body dressing   What is the patient wearing?: Pants, Non-skid slipper socks Underwear - Performed by patient: Thread/unthread right underwear leg, Thread/unthread left underwear leg, Pull underwear up/down   Pants- Performed by patient: Fasten/unfasten pants, Pull pants up/down, Thread/unthread left pants leg, Thread/unthread right pants leg Pants- Performed by helper: Thread/unthread right pants leg Non-skid slipper socks- Performed by patient: Don/doff right sock, Don/doff left sock Non-skid slipper socks- Performed by  helper: Don/doff right sock, Don/doff left sock     Shoes - Performed by patient: Don/doff right shoe, Don/doff left shoe(elastic laces)            Lower body assist Assist for lower body dressing: Touching or steadying assistance (Pt > 75%)      Toileting Toileting   Toileting steps  completed by patient: Adjust clothing prior to toileting, Performs perineal hygiene, Adjust clothing after toileting Toileting steps completed by helper: Performs perineal hygiene Toileting Assistive Devices: Grab bar or rail  Toileting assist Assist level: Touching or steadying assistance (Pt.75%)   Transfers Chair/bed transfer   Chair/bed transfer method: Ambulatory Chair/bed transfer assist level: Touching or steadying assistance (Pt > 75%) Chair/bed transfer assistive device: Medical sales representative     Max distance: 50 Assist level: Touching or steadying assistance (Pt > 75%)   Wheelchair   Type: Manual Max wheelchair distance: 250' Assist Level: Supervision or verbal cues  Cognition Comprehension Comprehension assist level: Follows complex conversation/direction with extra time/assistive device  Expression Expression assist level: Expresses basic needs/ideas: With no assist  Social Interaction Social Interaction assist level: Interacts appropriately with others - No medications needed.  Problem Solving Problem solving assist level: Solves basic 90% of the time/requires cueing < 10% of the time  Memory Memory assist level: Recognizes or recalls 90% of the time/requires cueing < 10% of the time    Medical Problem List and Plan: 1. 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 DVT Prophylaxis/Anticoagulation: Pharmaceutical:Lovenox 3. Pain Management:N/A 4. Mood:LCSW to follow for evaluation and support. 5. Neuropsych: This patientis not fullycapable of making decisions on hisown behalf.   Tele-sitter discussed with nursing, will monitor today and consider DC if not requiring anymore 6.Rash/Skin/Wound Care:Continue Triamcinolone cream tid. Monitor rash for healing. Maintain adequate nutritional and hydration status. 7. Fluids/Electrolytes/Nutrition:Monitor I/O.  8. HTN: Monitor BP bid. On Prinivil,  indapamide,   Vitals:   08/09/17 0804 08/09/17 0806  BP:    Pulse:    Resp:    Temp:    SpO2: 97% 97%    Controlled on 3/14 9. CAD:On ASA and lipitor. 10. H/o Hep C:No meds 11. COPD/Lung CA s/p XRT: On Breo and Ellipta daily. 12. Depression: On Wellbutrin XL. 13. Constipation: Increased MiraLAX to twice a day    Increased bowel reg on 3/5, again on 3/6   KUB reviewed, relatively unremarkable, but was taken and patient had a good bowel movement.   UA/U culture relatively unremarkable   Improving  14. Itching/rash: Due to recent bed bug infestation. Added Sarna lotion. 15. Prediabetes   Cont to monitor   CBGs   SSI ordered, added qhs SSI   Diet changed to Carb modified   Metformin 250 started on 3/12, increased to 500 on 3/14  Relatively controlled on 3/14 16. Hyponatremia   Na 133 on 3/13   Cont to monitor 17. AKI: Resolved   Cr 0.98 on 3/14   Encourage fluids   Continue to monitor   IVF [place] on 3/7  LOS (Days) 14 A FACE TO FACE EVALUATION WAS PERFORMED  Ankit [name] 08/09/2017 8:39 AM

## 2017-08-09 NOTE — NC FL2 (Signed)
[place] LEVEL OF CARE SCREENING TOOL     IDENTIFICATION  Patient Name: [name] Birthdate: 1955-10-03 Sex: male Admission Date (Current Location): 07/26/2017  [facility] and [place] Number:  Herbalist and Address:  [facility], [place] [address] [place],  [address]      Provider Number: [id number]  Attending Physician Name and Address:  Jamse Arn, MD  Relative Name and Phone Number:  [name], sister, [phone]    Current Level of Care: Other (Comment)(Acute Inpatient Rehab) Recommended Level of Care: [place] Prior Approval Number:    Date Approved/Denied:   PASRR Number: [id number]  Discharge Plan: SNF    Current Diagnoses: Patient Active Problem List   Diagnosis Date Noted  . Abdominal pain   . AKI (acute kidney injury) ([place])   . Hyponatremia   . Depression   . Constipation   . Infarction of left basal ganglia ([place]) 07/26/2017  . Brainstem infarction   . 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 Gait disturbance, post-stroke   . [employer] (NIH) Stroke Scale limb ataxia score 2, ataxia present in two limbs   . Coronary artery disease involving native coronary artery of native heart without angina pectoris   . Hepatitis C virus infection without hepatic coma   . Chronic obstructive pulmonary disease ([place])   . Benign essential HTN   . Prediabetes   . Tobacco abuse   . Cerebral thrombosis with cerebral infarction 07/23/2017  . Agitation   . History of alcohol abuse   . Altered mental status, unspecified 07/22/2017  . Ataxia   . Weakness   . Anemia, iron deficiency 04/04/2017  . 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 Decreased hearing of right ear 02/16/2016  . Peripheral arterial disease ([place])   . GERD (gastroesophageal reflux disease)   . Hepatitis C, chronic ([place]) 06/16/2015  . Major depressive  disorder, single episode, moderate ([place]) 04/09/2015  . Unspecified hereditary and idiopathic peripheral neuropathy 10/17/2013  . Urinary incontinence 12/26/2012  . Erectile dysfunction 03/07/2012  . Loss of weight 06/30/2011  . COPD exacerbation ([place]) 05/19/2011  . History of CVA (cerebrovascular accident) 12/28/2010  . Hyperlipidemia 12/28/2010  . Allergic rhinitis 09/09/2010  . MIGRAINE HEADACHE 01/11/2010  . Smoker 09/10/2009  . LOW BACK PAIN 11/27/2008  . COLONIC POLYPS, ADENOMATOUS, HX OF 02/24/2008  . COPD, moderate ([place]) 08/13/2006  . INSOMNIA NOS 07/26/2006    Orientation RESPIRATION BLADDER Height & Weight     Self, Time, Situation, Place  Normal Continent Weight: 61.2 kg (134 lb 14.7 oz) Height:  5\' 7"  (170.2 cm)  BEHAVIORAL SYMPTOMS/MOOD NEUROLOGICAL BOWEL NUTRITION STATUS      Continent Diet(regular diet and thin liquids)  AMBULATORY STATUS COMMUNICATION OF NEEDS Skin   Limited Assist Verbally Normal                       Personal Care Assistance Level of Assistance  Bathing, Dressing Bathing Assistance: Limited assistance Feeding assistance: Independent Dressing Assistance: Limited assistance     Functional Limitations Info  Speech          SPECIAL CARE FACTORS FREQUENCY  PT (By licensed PT), OT (By licensed OT)     PT Frequency: 5x/wk OT Frequency: 5x/wk            Contractures Contractures Info: Not present    Additional Factors Info  Code  Status, Allergies, Insulin Sliding Scale Code Status Info: full Allergies Info: NKDA   Insulin Sliding Scale Info: see med list       Current Medications (08/09/2017):  This is the current hospital active medication list Current Facility-Administered Medications  Medication Dose Route Frequency Provider Last Rate Last Dose  . acetaminophen (TYLENOL) tablet 325-650 mg  325-650 mg Oral Q4H PRN Love, Pamela S, PA-C      . alum & mag hydroxide-simeth (MAALOX/MYLANTA) 200-200-20 MG/5ML suspension 30  mL  30 mL Oral Q4H PRN Bary Leriche, PA-C   30 mL at 08/02/17 1825  . aspirin EC tablet 81 mg  81 mg Oral Daily Bary Leriche, PA-C   81 mg at 08/09/17 5462  . atorvastatin (LIPITOR) tablet 80 mg  80 mg Oral q1800 Bary Leriche, PA-C   80 mg at 08/08/17 1711  . bisacodyl (DULCOLAX) suppository 10 mg  10 mg Rectal Daily PRN Bary Leriche, PA-C   10 mg at 08/07/17 2011  . buPROPion (WELLBUTRIN XL) 24 hr tablet 150 mg  150 mg Oral Daily Bary Leriche, PA-C   150 mg at 08/09/17 7035  . camphor-menthol (SARNA) lotion   Topical TID WC & HS Love, Pamela S, PA-C      . clopidogrel (PLAVIX) tablet 75 mg  75 mg Oral Daily Bary Leriche, PA-C   75 mg at 08/09/17 0093  . diphenhydrAMINE (BENADRYL) 12.5 MG/5ML elixir 12.5-25 mg  12.5-25 mg Oral Q6H PRN Love, Pamela S, PA-C      . enoxaparin (LOVENOX) injection 40 mg  40 mg Subcutaneous Q24H Love, Pamela S, PA-C   40 mg at 08/08/17 1711  . feeding supplement (ENSURE ENLIVE) (ENSURE ENLIVE) liquid 237 mL  237 mL Oral BID BM Love, Ivan Anchors, PA-C   237 mL at 08/09/17 1048  . fluticasone furoate-vilanterol (BREO ELLIPTA) 200-25 MCG/INH 1 puff  1 puff Inhalation Daily Bary Leriche, PA-C   1 puff at 08/09/17 0805  . folic acid (FOLVITE) tablet 1 mg  1 mg Oral Daily Bary Leriche, PA-C   1 mg at 08/09/17 8182  . guaiFENesin-dextromethorphan (ROBITUSSIN DM) 100-10 MG/5ML syrup 5-10 mL  5-10 mL Oral Q6H PRN Love, Pamela S, PA-C      . indapamide (LOZOL) tablet 1.25 mg  1.25 mg Oral Daily Love, Ivan Anchors, PA-C   1.25 mg at 08/09/17 0809  . insulin aspart (novoLOG) injection 0-5 Units  0-5 Units Subcutaneous QHS Patel, Domenick Bookbinder, MD      . insulin aspart (novoLOG) injection 0-9 Units  0-9 Units Subcutaneous TID WC Jamse Arn, MD   2 Units at 08/08/17 1710  . ipratropium-albuterol (DUONEB) 0.5-2.5 (3) MG/3ML nebulizer solution 3 mL  3 mL Nebulization Q6H PRN Love, Pamela S, PA-C      . lisinopril (PRINIVIL,ZESTRIL) tablet 2.5 mg  2.5 mg Oral Daily Love,  Ivan Anchors, PA-C   2.5 mg at 08/09/17 9937  . loratadine (CLARITIN) tablet 10 mg  10 mg Oral Daily Bary Leriche, PA-C   10 mg at 08/09/17 1696  . [START ON 08/10/2017] metFORMIN (GLUCOPHAGE) tablet 500 mg  500 mg Oral Q breakfast Jamse Arn, MD      . multivitamin with minerals tablet 1 tablet  1 tablet Oral Daily Bary Leriche, PA-C   1 tablet at 08/09/17 7893  . nicotine (NICODERM CQ - dosed in mg/24 hours) patch 21 mg  21 mg Transdermal Daily Love, Ivan Anchors,  PA-C   21 mg at 08/09/17 0809  . nicotine polacrilex (COMMIT) lozenge 4 mg  4 mg Oral Q2H PRN Bary Leriche, PA-C   4 mg at 08/09/17 9728  . pantoprazole (PROTONIX) EC tablet 40 mg  40 mg Oral Daily Bary Leriche, PA-C   40 mg at 08/09/17 2060  . polyethylene glycol (MIRALAX / GLYCOLAX) packet 17 g  17 g Oral Daily PRN Bary Leriche, PA-C   17 g at 07/31/17 1710  . polyethylene glycol (MIRALAX / GLYCOLAX) packet 17 g  17 g Oral BID Bary Leriche, PA-C   17 g at 08/09/17 0809  . prochlorperazine (COMPAZINE) tablet 5-10 mg  5-10 mg Oral Q6H PRN Bary Leriche, PA-C   10 mg at 08/05/17 1136   Or  . prochlorperazine (COMPAZINE) injection 5-10 mg  5-10 mg Intramuscular Q6H PRN Love, Pamela S, PA-C       Or  . prochlorperazine (COMPAZINE) suppository 12.5 mg  12.5 mg Rectal Q6H PRN Love, Pamela S, PA-C      . senna-docusate (Senokot-S) tablet 2 tablet  2 tablet Oral BID Jamse Arn, MD   2 tablet at 08/09/17 [phone]  . sodium phosphate (FLEET) 7-19 GM/118ML enema 1 enema  1 enema Rectal Once PRN Love, Pamela S, PA-C      . sucralfate (CARAFATE) 1 GM/10ML suspension 1 g  1 g Oral TID WC & HS Love, Ivan Anchors, PA-C   1 g at 08/09/17 1247  . thiamine (VITAMIN B-1) tablet 100 mg  100 mg Oral Daily Bary Leriche, PA-C   100 mg at 08/09/17 5379   Or  . thiamine (B-1) injection 100 mg  100 mg Intravenous Daily Love, Pamela S, PA-C      . traZODone (DESYREL) tablet 25-50 mg  25-50 mg Oral QHS PRN Bary Leriche, PA-C   50 mg at 08/09/17  0115  . triamcinolone 0.1 % cream : eucerin cream, 1:1   Topical BID Love, Pamela S, PA-C      . umeclidinium bromide (INCRUSE ELLIPTA) 62.5 MCG/INH 1 puff  1 puff Inhalation Daily Love, Ivan Anchors, PA-C   1 puff at 08/09/17 0805     Discharge Medications: Please see discharge summary for a list of discharge medications.  Relevant Imaging Results:  Relevant Lab Results:   Additional Information SSN: [facility]  Amarylis Rovito, LCSW

## 2017-08-09 NOTE — Progress Notes (Signed)
Occupational Therapy Session Note  Patient Details  Name: [name] MRN: [id number] Date of Birth: Jul 23, 1955  Today's Date: 08/09/2017 OT Individual Time: [phone] and 1445-1530 (make up time) OT Individual Time Calculation (min): 73 min and 45 min (make up time)   Short Term Goals: Week 2:  OT Short Term Goal 1 (Week 2): Pt will complete bathing at sit > stand level with supervision OT Short Term Goal 2 (Week 2): Pt will complete LB dressing at sit > stand level with supervision OT Short Term Goal 3 (Week 2): Pt will complete toilet transfers with min assist with LRAD OT Short Term Goal 4 (Week 2): Pt will complete 2 grooming tasks in standing with supervision  Skilled Therapeutic Interventions/Progress Updates:    1) Treatment session with focus on functional transfers, dynamic standing balance, and RUE gross and fine motor control.  Pt received supine in bed frustrated by missing breakfast but willing to engage in therapy session.  Engaged in toilet transfers with focus on safe use of RW and transitional movements.  Pt donned shoes prior to ambulation with increased time.  Ambulated to toilet with RW with min assist with min cues for attention to RLE during mobility.  Engaged in ball toss in sitting and standing with focus on symmetrical use of BUE with pt demonstrating increased coordination challenge with RUE when completing overhead toss and chest passes due to greater gross motor challenge.  Min assist for dynamic balance during ball toss with pt losing balance multiple times requiring mod assist to correct. Completed 9 hole peg test: Rt: 1: 25 with difficulty picking up small pegs and Lt: 53 seconds.  Also completed bocks and blocks test: Rt: 21 bocks and Lt: 25 blocks in 1 minute.  Ambulated back to room with RW with min assist and min cues to attend to RLE as pt fatigued.  2) Pt seen for make up therapy session with focus on trunk control and RUE gross and fine motor control.   Pt donned shoes seated at EOB and completed stand pivot transfers bed > w/c > therapy mat with supervision.  Engaged in sit > stand with focus on trunk control and reaching activity in standing to challenge trunk control and balance reactions.  Therapist provided min assist during trunk rotation when reaching outside BOS due to decreased motor control.  Reaching activity with RUE incorporating crossing midline and outside BOS to hang clothespins and horseshoes on basketball goal to challenge motor control and grip strength.  Engaged in ball toss from [place] <> Lt hand to focus on motor control with pt dropping ball repeatedly and demonstrating decreased motor control and proprioception in RUE.  Increased success when bringing hands closer together.  Returned to room to toilet.  Completed stand pivot transfer w/c <> toilet with close supervision.  Pt passed off to PT.  Therapy Documentation Precautions:  Precautions Precautions: Fall Restrictions Weight Bearing Restrictions: No General:   Vital Signs: Oxygen Therapy SpO2: 97 % O2 Device: Room Air Pain: Pain Assessment Pain Score: 0-No pain  See Function Navigator for Current Functional Status.   Therapy/Group: Individual Therapy  [name] 08/09/2017, 10:15 AM

## 2017-08-09 NOTE — Patient Care Conference (Signed)
Inpatient RehabilitationTeam Conference and Plan of Care Update Date: 08/08/2017   Time: 2:33 PM    Patient Name: [name]      Medical Record Number: [id number]  Date of Birth: 1956/01/15 Sex: Male         Room/Bed: 4M12C/4M12C-01 Payor Info: Payor: [employer] MEDICARE / Plan: [employer] MEDICARE HMO / Product Type: *No Product type* /    Admitting Diagnosis: L CVA   Admit Date/Time:  07/26/2017  5:18 PM Admission Comments: No comment available   Primary Diagnosis:  <principal problem not specified> Principal Problem: <principal problem not specified>  Patient Active Problem List   Diagnosis Date Noted  . Abdominal pain   . AKI (acute kidney injury) ([place])   . Hyponatremia   . Depression   . Constipation   . Infarction of left basal ganglia ([place]) 07/26/2017  . Brainstem infarction   . 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 Gait disturbance, post-stroke   . [employer] [facility] (NIH) Stroke Scale limb ataxia score 2, ataxia present in two limbs   . Coronary artery disease involving native coronary artery of native heart without angina pectoris   . Hepatitis C virus infection without hepatic coma   . Chronic obstructive pulmonary disease ([place])   . Benign essential HTN   . Prediabetes   . Tobacco abuse   . Cerebral thrombosis with cerebral infarction 07/23/2017  . Agitation   . History of alcohol abuse   . Altered mental status, unspecified 07/22/2017  . Ataxia   . Weakness   . Anemia, iron deficiency 04/04/2017  . 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 Decreased hearing of right ear 02/16/2016  . Peripheral arterial disease ([place])   . GERD (gastroesophageal reflux disease)   . Hepatitis C, chronic ([place]) 06/16/2015  . Major depressive disorder, single episode, moderate ([place]) 04/09/2015  . Unspecified hereditary and idiopathic peripheral neuropathy 10/17/2013  . Urinary incontinence 12/26/2012  . Erectile  dysfunction 03/07/2012  . Loss of weight 06/30/2011  . COPD exacerbation ([place]) 05/19/2011  . History of CVA (cerebrovascular accident) 12/28/2010  . Hyperlipidemia 12/28/2010  . Allergic rhinitis 09/09/2010  . MIGRAINE HEADACHE 01/11/2010  . Smoker 09/10/2009  . LOW BACK PAIN 11/27/2008  . COLONIC POLYPS, ADENOMATOUS, HX OF 02/24/2008  . COPD, moderate ([place]) 08/13/2006  . INSOMNIA NOS 07/26/2006    Expected Discharge Date: Expected Discharge Date: 08/16/17  Team Members Present: Physician leading conference: Dr. Delice Lesch Social Worker Present: Lennart Pall, LCSW Nurse Present: Benjie Karvonen, RN PT Present: Leavy Cella, PT OT Present: [name], OT SLP Present: Windell Moulding, SLP PPS Coordinator present : Daiva Nakayama, RN, CRRN     Current Status/Progress Goal Weekly Team Focus  Medical   Right hemiparesis and ataxia secondary to left Basal ganglia and pontine infarcts  Improve mobility, cognition, safety, prediabetes, hyponatremia, AKI  See above   Bowel/Bladder   continent of B/B LBM 08/08/17  remain continent of b/b medicate for constipation prn  assess q shift and prn   Swallow/Nutrition/ Hydration             ADL's   Min assist transfers with RW, min guard bathing/dressing, RUE decreased coordination/proprioception  Supervision overall  ADL retraining, safety awareness, RUE NMR   Mobility   min A gait and transfers  supervision overall  attention and awareness, NMR, balance, gait   Communication             Safety/Cognition/  Behavioral Observations            Pain   denies pain  free of pain  assess q shift and prn   Skin   skin intact   free from breakdown no c/o itching  continue sarma and tramcinolone aesses skin q shift and prn    Rehab Goals Patient on target to meet rehab goals: Yes *See Care Plan and progress notes for long and short-term goals.     Barriers to Discharge  Current Status/Progress Possible Resolutions Date Resolved   Physician     Inaccessible home environment;Decreased caregiver support;Medical stability;Home environment access/layout;Lack of/limited family support     See above  Therapies, encourage fluids, follow labs      Nursing                  PT                    OT                  SLP                SW                Discharge Planning/Teaching Needs:  Pt and sister now report that pt will d/c to sister's home, however, she will need to coordinate 24/7 support still.  Teaching to be completed with sister prior to d/c.   Team Discussion:  Still c/o some abd pain;  Phone lost the other day and pt still very upset about this.  Pt seems fixated on bowels.  He has refused some sessions due to fatigue and stomach pain. Currently min assist overall.  Still with poor proprioception and increased impulsivity.  Team still feels pt must have 24/7 supervision.  SW to follow up with pt and sister to determine whether or not this can be arranged and may be changing the plan to SNF.  Revisions to Treatment Plan:  None    Continued Need for Acute Rehabilitation Level of Care: The patient requires daily medical management by a physician with specialized training in physical medicine and rehabilitation for the following conditions: Daily direction of a multidisciplinary physical rehabilitation program to ensure safe treatment while eliciting the highest outcome that is of practical value to the patient.: Yes Daily medical management of patient stability for increased activity during participation in an intensive rehabilitation regime.: Yes Daily analysis of laboratory values and/or radiology reports with any subsequent need for medication adjustment of medical intervention for : Neurological problems;Renal problems;Mood/behavior problems  Hazelee Harbold 08/09/2017, 12:33 PM

## 2017-08-09 NOTE — Progress Notes (Signed)
Physical Therapy Note  Patient Details  Name: [name] MRN: [id number] Date of Birth: 07/19/1955 Today's Date: 08/09/2017  1045-1200, 75 min individual tx Pain:none per pt  This note composed with input from [facility] Mc[place], SPT.   Bed mobility in flat bed without rails, supervision without cues.  Stand pivot with use of RW with min assist to stand, mod assist to turn due to LOB R.  Nustep x 6' lvl 4 with minimal trunk shifting to the R noted.  W/c propulsion x 200 ft with B UE with S assist  Standing hip ABD x 10 ea leg and heel raises x 12 with countertop support.  Neuro reducation included: Controlled pronation on the R foot for increased ankle stability and proprioception x 30" due to noted forced R ankle supination in standing on airex pad. Lateral and diagonal weight shifting on airex x 30" each to increase proprioception, with min A for support using PTs arms. Seated toe tapping x 10 on cones laterally to increase hip ABD control and awareness.  Gait training with min A for arm support in front of pt x 40' with 2 lb wt on R ankle + 25' with 3/4 lb wt on R ankle, with narrow BOS and R posterolateral trunk lean noted with fatigue. Multimodal cueing to maintain stepping pattern and upright posture. Pt had one LOB when turning to the L that was caught by PT.   Pt educated on benefits of smoking cessation.  Pt left in w/c with quick release belt and chair alarm on, with all needs at hand.  See function navigator for current status.   [name] 08/09/2017, 8:56 AM

## 2017-08-09 NOTE — Progress Notes (Signed)
Social Work Patient ID: [name], male   DOB: 12-05-1955, 62 y.o.   MRN: [id number]   Have met and reviewed team conference with pt and explained that team is still recommending that he have 24/7 assistance at home for safety.  He does not argue with the recommendation but insists that his sister will not take time off of work and could not provide this care in her home.  I explained that the only other option I can offer for him is to go to SNF from here where he would continue to do therapies and work on getting home from there.  He is upset by this plan but agreeable.  He is also very upset about the loss of his phone and asks "what are ya'll going to do about that?"  I will follow up with nursing director to check status of phone search.  Will begin SNF placement.  Cherolyn Behrle, LCSW

## 2017-08-09 NOTE — Progress Notes (Signed)
Physical Therapy Session Note  Patient Details  Name: [name] MRN: [id number] Date of Birth: 10-29-1955  Today's Date: 08/09/2017 PT Individual Time: 1530-1615 PT Individual Time Calculation (min): 45 min   Short Term Goals: Week 2:  PT Short Term Goal 1 (Week 2): pt will demo gait in home environment with min A x 25' PT Short Term Goal 2 (Week 2): pt will perform stair negotiation x 2 steps without handrails with min A  Skilled Therapeutic Interventions/Progress Updates:    Pt received sitting in WC and agreeable to PT  Pt transported to main entrance of hospital in [facility]  Gait training with RW on unlevel surface 2 x 130f; PT provided min assist for safety and min cues for increased step width on the RLE. Pt noted to have improved step width on this day compared to previous treats, but continues to require to visualize feet while ambulating.   Kinetron reciprocal movement training in standing 5 x 45 sec. Min cues from PT for proper weight shifting, posture and symmetry of movement.   [facility] and all needs met.        Therapy Documentation Precautions:  Precautions Precautions: Fall Restrictions Weight Bearing Restrictions: No Vital Signs: Therapy Vitals Temp: 98.1 F (36.7 C) Temp Source: Oral Pulse Rate: 84 Resp: 18 BP: (!) 102/57 Patient Position (if appropriate): Lying Oxygen Therapy SpO2: 97 % O2 Device: Room Air Pain: 0/10   See Function Navigator for Current Functional Status.   Therapy/Group: Individual Therapy  ALorie Phenix3/14/2019, 5:40 PM

## 2017-08-10 ENCOUNTER — Inpatient Hospital Stay (HOSPITAL_COMMUNITY): Payer: Medicare HMO | Admitting: Physical Therapy

## 2017-08-10 ENCOUNTER — Inpatient Hospital Stay (HOSPITAL_COMMUNITY): Payer: Medicare HMO | Admitting: Occupational Therapy

## 2017-08-10 LAB — GLUCOSE, CAPILLARY
GLUCOSE-CAPILLARY: 135 mg/dL — AB (ref 65–99)
Glucose-Capillary: 115 mg/dL — ABNORMAL HIGH (ref 65–99)
Glucose-Capillary: 106 mg/dL — ABNORMAL HIGH (ref 65–99)
Glucose-Capillary: 117 mg/dL — ABNORMAL HIGH (ref 65–99)

## 2017-08-10 NOTE — Progress Notes (Signed)
Physical Therapy Session Note  Patient Details  Name: [name] MRN: [id number] Date of Birth: 09-Mar-1956  Today's Date: 08/10/2017 PT Individual Time: 9147-8295 PT Individual Time Calculation (min): 57 min   Short Term Goals: Week 1:  PT Short Term Goal 1 (Week 1): pt will consistently perform functional transfers with mod A PT Short Term Goal 1 - Progress (Week 1): Met PT Short Term Goal 2 (Week 1): pt will gait in controlled environment x 50' with min A PT Short Term Goal 2 - Progress (Week 1): Met  Skilled Therapeutic Interventions/Progress Updates:    Pt received in bed, agreeable to therapy. Pt transfers EOB and dons shoes supervision, including R brace requiring extra time. Pt with no complaints of pain. Pt states he can walk with RW and does not need w/c. Pt ambulates >150 ft with RW min A to day room with poor foot clearance, intermittent scissoring with narrow step width, and decreased balance. Pt w/ intermittent R foot inversion during gait. Pt performed Wii bowling with min A to improve coordination, dynamic standing balance, and weight shifting. Pt reports some difficulty with manipulating remote in R hand and difficulty supinating arm on the follow through motion. Pt w/ 1 LOB and requiring couple rest breaks. Pt performed Nustep level 4 BUE + BLE 65mn, stating it was "medium" difficulty. Pt ambulated >150 ft to room with AD mod assist, 1 LOB due to R foot not clearing floor. Pt left in bed, alarm on, telesitter in place and all needs within reach.    Therapy Documentation Precautions:  Precautions Precautions: Fall Restrictions Weight Bearing Restrictions: No    See Function Navigator for Current Functional Status.   Therapy/Group: Individual Therapy  LCaffie Damme3/15/2019, 4:37 PM

## 2017-08-10 NOTE — Progress Notes (Signed)
[facility] PHYSICAL MEDICINE & REHABILITATION     PROGRESS NOTE  Subjective/Complaints:  Patient lying in bed.  No new complaints.  Slept well.  Denies pain.   ROS: Patient denies fever, rash, sore throat, blurred vision, nausea, vomiting, diarrhea, cough, shortness of breath or chest pain, joint or back pain, headache, or mood change.    Objective: Vital Signs: Blood pressure 123/78, pulse 99, temperature 98.4 F (36.9 C), temperature source Oral, resp. rate 18, height 5\' 7"  (1.702 m), weight 61.2 kg (134 lb 14.7 oz), SpO2 99 %. No results found. Recent Labs    08/09/17 0647  WBC 7.7  HGB 13.9  HCT 40.6  PLT 229   Recent Labs    08/09/17 0647  NA 133*  K 3.9  CL 99*  GLUCOSE 112*  BUN 26*  CREATININE 0.98  CALCIUM 9.2   CBG (last 3)  Recent Labs    08/09/17 1652 08/09/17 2112 08/10/17 0644  GLUCAP 130* 97 115*    Wt Readings from Last 3 Encounters:  08/08/17 61.2 kg (134 lb 14.7 oz)  07/22/17 66.8 kg (147 lb 4.3 oz)  06/28/17 68.9 kg (151 lb 12.8 oz)    Physical Exam:  BP 123/78 (BP Location: Left Arm)   Pulse 99   Temp 98.4 F (36.9 C) (Oral)   Resp 18   Ht 5\' 7"  (1.702 m)   Wt 61.2 kg (134 lb 14.7 oz)   SpO2 99%   BMI 21.13 kg/m  Constitutional: No distress . Vital signs reviewed. HEENT: EOMI, oral membranes moist Cardiovascular: RRR without murmur. No JVD    Respiratory: CTA Bilaterally without wheezes or rales. Normal effort    GI: BS +, non-tender, non-distended  Musculoskeletal: He exhibits noedema, no tenderness.  Neurological: He isalertand oriented.  Motor: LUE/LLE: 4+/5 proximal to distal 4+/5 right delt bi tri grip 4/5 right HF 4/5 KE, 4+/5 R ankle DF (stable motor exam)  Skin: Skin iswarmand dry. He isnot diaphoretic.  Psychiatric: Slightly impulsive and irritable.  Assessment/Plan: 1. Functional deficits secondary to left Basal ganglia and pontine infarcts which require 3+ hours per day of interdisciplinary therapy in a  comprehensive inpatient rehab setting. Physiatrist is providing close team supervision and 24 hour management of active medical problems listed below. Physiatrist and rehab team continue to assess barriers to discharge/monitor patient progress toward functional and medical goals.  Function:  Bathing Bathing position   Position: Shower  Bathing parts Body parts bathed by patient: Right arm, Chest, Abdomen, Front perineal area, Right upper leg, Left upper leg, Left arm, Buttocks, Right lower leg, Left lower leg Body parts bathed by helper: Back  Bathing assist Assist Level: Touching or steadying assistance(Pt > 75%)      Upper Body Dressing/Undressing Upper body dressing   What is the patient wearing?: Pull over shirt/dress     Pull over shirt/dress - Perfomed by patient: Thread/unthread right sleeve, Put head through opening, Thread/unthread left sleeve, Pull shirt over trunk Pull over shirt/dress - Perfomed by helper: Thread/unthread right sleeve        Upper body assist Assist Level: Supervision or verbal cues      Lower Body Dressing/Undressing Lower body dressing   What is the patient wearing?: Pants, Non-skid slipper socks Underwear - Performed by patient: Thread/unthread right underwear leg, Thread/unthread left underwear leg, Pull underwear up/down   Pants- Performed by patient: Fasten/unfasten pants, Pull pants up/down, Thread/unthread left pants leg, Thread/unthread right pants leg Pants- Performed by helper: Thread/unthread right pants  leg Non-skid slipper socks- Performed by patient: Don/doff right sock, Don/doff left sock Non-skid slipper socks- Performed by helper: Don/doff right sock, Don/doff left sock     Shoes - Performed by patient: Don/doff right shoe, Don/doff left shoe(elastic laces)            Lower body assist Assist for lower body dressing: Touching or steadying assistance (Pt > 75%)      Toileting Toileting   Toileting steps completed by  patient: Adjust clothing prior to toileting, Performs perineal hygiene, Adjust clothing after toileting Toileting steps completed by helper: Performs perineal hygiene Toileting Assistive Devices: Grab bar or rail  Toileting assist Assist level: Supervision or verbal cues   Transfers Chair/bed transfer   Chair/bed transfer method: Stand pivot Chair/bed transfer assist level: Touching or steadying assistance (Pt > 75%) Chair/bed transfer assistive device: Medical sales representative     Max distance: 50 Assist level: Moderate assist (Pt 50 - 74%)   Wheelchair   Type: Manual Max wheelchair distance: 200 Assist Level: Supervision or verbal cues  Cognition Comprehension Comprehension assist level: Follows complex conversation/direction with extra time/assistive device  Expression Expression assist level: Expresses basic needs/ideas: With no assist  Social Interaction Social Interaction assist level: Interacts appropriately with others - No medications needed.  Problem Solving Problem solving assist level: Solves basic 90% of the time/requires cueing < 10% of the time  Memory Memory assist level: Recognizes or recalls 75 - 89% of the time/requires cueing 10 - 24% of the time    Medical Problem List and Plan: 1. 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 DVT Prophylaxis/Anticoagulation: Pharmaceutical:Lovenox 3. Pain Management:N/A 4. Mood:LCSW to follow for evaluation and support. 5. Neuropsych: This patientis not fullycapable of making decisions on hisown behalf.   Tele-sitter--consider removing today or Monday. 6.Rash/Skin/Wound Care:Continue Triamcinolone cream tid. Monitor rash for healing. Maintain adequate nutritional and hydration status. 7. Fluids/Electrolytes/Nutrition:Monitor I/O.  8. HTN: Monitor BP bid. On Prinivil, indapamide,   Vitals:   08/10/17 0402 08/10/17 0924  BP: 123/78   Pulse: 99    Resp: 18   Temp: 98.4 F (36.9 C)   SpO2: 100% 99%    Controlled on 3/15 9. CAD:On ASA and lipitor. 10. H/o Hep C:No meds 11. COPD/Lung CA s/p XRT: On Breo and Ellipta daily. 12. Depression: On Wellbutrin XL. 13. Constipation: Increased MiraLAX to twice a day    Increased bowel reg on 3/5, again on 3/6   Moved bowels on 3/14 .   UA/U culture relatively unremarkable   Improving  14. Itching/rash: Due to recent bed bug infestation. Added Sarna lotion. Improving 15. Prediabetes   Cont to monitor   CBGs   SSI ordered, added qhs SSI   Diet changed to Carb modified   Metformin 250 started on 3/12, increased to 500 on 3/14  Good control as of 08/10/2017 16. Hyponatremia   Na 133 on 3/13   Cont to monitor 17. AKI: Resolved   Cr 0.98 on 3/14   Continue to push fluids   IVF DC'd on 3/7  LOS (Days) 15 A FACE TO FACE EVALUATION WAS PERFORMED  Meredith Staggers 08/10/2017 10:26 AM

## 2017-08-10 NOTE — Progress Notes (Signed)
Occupational Therapy Weekly Progress Note  Patient Details  Name: [name] MRN: [id number] Date of Birth: 1956/04/10  Beginning of progress report period: August 04, 2017 End of progress report period: August 10, 2017  Today's Date: 08/10/2017 OT Individual Time: 1305-1400 OT Individual Time Calculation (min): 55 min    Patient has met 4 of 4 short term goals.  Pt is making steady progress towards goals.  Pt currently requires min assist with transfers with RW due to impulsivity and decreased safety awareness.  Pt currently supervision for bathing and dressing tasks and dynamic standing.  Pt continues to demonstrate decreased gross and fine motor control in RUE, however with functional improvements during self-care tasks.  Continue to recommend supervision upon d/c secondary to decreased safety awareness, impulsivity, and decreased proprioception and sensation in RUE/RLE.  Patient continues to demonstrate the following deficits: muscle weakness, unbalanced muscle activation and decreased coordination, decreased visual perceptual skills and decreased visual motor skills, decreased attention, decreased awareness, decreased problem solving, decreased safety awareness and decreased memory and decreased standing balance, decreased postural control, hemiplegia and decreased balance strategies and therefore will continue to benefit from skilled OT intervention to enhance overall performance with BADL and Reduce care partner burden.  Patient progressing toward long term goals..  Continue plan of care.  OT Short Term Goals Week 2:  OT Short Term Goal 1 (Week 2): Pt will complete bathing at sit > stand level with supervision OT Short Term Goal 1 - Progress (Week 2): Met OT Short Term Goal 2 (Week 2): Pt will complete LB dressing at sit > stand level with supervision OT Short Term Goal 2 - Progress (Week 2): Met OT Short Term Goal 3 (Week 2): Pt will complete toilet transfers with min assist with  LRAD OT Short Term Goal 3 - Progress (Week 2): Met OT Short Term Goal 4 (Week 2): Pt will complete 2 grooming tasks in standing with supervision OT Short Term Goal 4 - Progress (Week 2): Met Week 3:  OT Short Term Goal 1 (Week 3): STG = LTGs due to remaining LOS  Skilled Therapeutic Interventions/Progress Updates:    Treatment session with focus on dynamic standing balance and RUE NMR.  Pt received supine in bed complaining about fall policy.  Educated pt on current policy and pt fall risk and procedures in place to maintain his safety.  Pt still frustrated but reports understanding.  Pt donned shoes with ability to tie laces this session, therefore switched out elastic laces for standard laces which will also allow for improved fit of AFO.  Pt ambulated to therapy gym with RW and min guard, requiring min assist when turning impulsively to sit on edge of mat.  Engaged in overhead ball toss in sitting with focus on gross motor control as well as balance reactions.  After each toss engaged pt in various trunk control activities with trunk rotation, overhead reaching, squats, and lunges.  Pt able to complete all tasks with min assist, except lunges requiring mod assist and modified technique.  Ambulated back to room as above and left supine in bed with all needs in reach.  Therapy Documentation Precautions:  Precautions Precautions: Fall Restrictions Weight Bearing Restrictions: No Pain: Pt with no c/o pain  See Function Navigator for Current Functional Status.   Therapy/Group: Individual Therapy  [name] 08/10/2017, 2:30 PM

## 2017-08-10 NOTE — Discharge Summary (Signed)
Physician Discharge Summary  Patient ID: [name] MRN: [id number] DOB/AGE: 06-30-1955 62 y.o.  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 History of chemotherapy and radiation treatment for right-sided lung cancer in 2006. EXAM: CHEST  2 VIEW COMPARISON:  Chest radiograph dated 12/01/2016 and chest CT dated 06/23/2015 FINDINGS: There is emphysematous changes of the lungs. Stable postradiation treatment of the right hilum there is no focal consolidation, pleural effusion, or pneumothorax. The cardiac silhouette is within normal limits. No acute osseous pathology. IMPRESSION: No active cardiopulmonary disease. Electronically Signed   By: Anner Crete M.D.   On: 07/21/2017 22:13   Dg Abd 1 View  Result Date: 08/01/2017 CLINICAL DATA:  Abdominal pain, constipation. EXAM: Supine abdominal radiograph. COMPARISON:  PET-CT study dated November 23, 2009. FINDINGS: The bowel gas pattern is normal. The stool burden is not excessive. The bony structures are unremarkable. There are linear calcifications in the upper abdomen to the left of midline which are of uncertain etiology. These may be vascular in nature. There are vascular calcifications in the iliac arteries bilaterally. There are surgical clips in the gallbladder fossa. IMPRESSION: Normal colonic stool burden. No definite acute intra-abdominal abnormality. Electronically Signed   By: David  Martinique  M.D.   On: 08/01/2017 12:59     Labs:  Basic Metabolic Panel: BMP Latest Ref Rng & Units 08/09/2017 08/06/2017 08/02/2017  Glucose 65 - 99 mg/dL 112(H) 135(H) 108(H)  BUN 6 - 20 mg/dL 26(H) 29(H) 28(H)  Creatinine 0.61 - 1.24 mg/dL 0.98 1.10 0.92  BUN/Creat Ratio 10 - 24 - - -  Sodium 135 - 145 mmol/L 133(L) 132(L) 135  Potassium 3.5 - 5.1 mmol/L 3.9 3.8 4.2  Chloride 101 - 111 mmol/L 99(L) 97(L) 101  CO2 22 - 32 mmol/L 25 26 24   Calcium 8.9 - 10.3 mg/dL 9.2 9.6 9.2    CBC: CBC Latest Ref Rng & Units 08/09/2017 08/06/2017 08/02/2017  WBC 4.0 - 10.5 K/uL 7.7 9.4 8.3  Hemoglobin 13.0 - 17.0 g/dL 13.9 15.4 14.4  Hematocrit 39.0 - 52.0 % 40.6 45.4 42.6  Platelets 150 - 400 K/uL 229 305 274    CBG: Recent Labs  Lab 08/12/17 2100 08/13/17 0708 08/13/17 1708 08/13/17 2117 08/14/17 0650  GLUCAP 141* 122* 101* 160* 92    Brief HPI:   [name] a 62 y.o.malewith history of CAD,Hep C, GERD, COPD, lung cancer, multiple prior strokes who was admitted on 07/21/17 with fall, headaches, mental status changes and right sided weakness for few days.History taken from chart review and patient.He was agitated at admission with question of ETOH withdrawal. UDS negative. CTA headreviewed, showing left basal ganglia infarct. Per report,evolving acute left basal ganglia to temporal stem infarct with old right cerebellar, pontine and left basal ganglia and thalamic stroke with moderate parenchymal brain volume loss, moderate stenosis right M2 segment and moderate stenosis proximal L-SA. 2 D echo showed EF of 55-60% with calcified mitral valve. Follow up MRI brain  showed acute left caudate to temporal stem infarct and acute left pontine infarct with possible microhemorrhage.  Dr. Erlinda Hong felt stroke to be due to small vessel disease v/s cardioembolic ---patient reported non-compliance with medication as well as ongoing tobacco use. To continue ASA/Plavix and 30 day event monitor recommended at  discharge to rule out A fib. He continues to be oriented to self only with impulsivity, right sided weakness and ataxic gait.Has been requiring sitter for safety.Mentation improving and CIR recommended due to functional deficits.   Hospital Course: [name] was admitted to rehab 07/26/2017 for inpatient therapies to consist of PT, ST and OT at least three hours five days a week. Past admission physiatrist, therapy team and rehab RN have worked together to provide customized collaborative inpatient rehab.  Blood pressures were monitored on bid basis and have been controlled. His po intake was variable and nutritional supplements were added to help maintain nutritional support. He was found to have AKI and was treated with IVF briefly. SCr has improved to 0.98 and fluids were discontinued on 3/7.    He was noted to be prediabetic with A1C of 5.7 therefore BS were monitored with ac/hs cbg checks. As intake improved, blood sugars started trending upwards and  low dose metformin was added for better control. Tele-sitter was ordered for safety and this was removed from room by 3/18 as his safety awareness has improved. He has had issues with abdominal pain with nausea due to dyspepsia. Bowel program has been augmented and Carafate was added to help manage symptoms. He has made good progress and is at supervision level. As family is unable to provide care needed, patient elected on SNF for progressive therapy. He was [facility] to [facility] on 08/14/17.    Rehab course: During patient's stay in rehab weekly team conferences were held to monitor patient's progress, set goals and discuss barriers to discharge. At admission, patient required mod assist with basic self care tasks and max assist with mobility. He displayed mild cognitive deficits affecting problem solving and memory.  He  has had improvement in activity tolerance, balance, postural control as well as ability to compensate for deficits.  He has had improvement in functional use RUE  and RLE as well as improvement in awareness.  He is able to complete ADL tasks with supervision. He needs supervision for transfers and supervision with cues to decrease toe drag and help with foot clearance  to ambulate >150' with RW. He requires min assist with home management tasks.  He requires supervision for problem solving, recall of new information and working memory. Speech therapy signed off by 3/8 as patient was back to baseline cognition.    Disposition: [facility].  Diet: Heart healthy/Carb Modified.   Special Instructions: 1. Needs follow up CBC/ BMET in 3-5 days.  2. Offer fluids between meals.    Discharge Instructions    Ambulatory referral to Physical Medicine Rehab   Complete by:  As directed    4 weeks follow up     Allergies as of 08/14/2017   No Known Allergies     Medication List    STOP taking these medications   gabapentin 300 MG capsule Commonly known as:  NEURONTIN   omeprazole 40 MG capsule Commonly known as:  PRILOSEC Replaced by:  pantoprazole 40 MG tablet   triamcinolone cream 0.5 % Commonly known as:  KENALOG     TAKE these medications   acetaminophen 325 MG tablet Commonly known as:  TYLENOL Take 1-2 tablets (325-650 mg total) by mouth every 4 (four) hours as needed for mild pain.   albuterol 108 (90 Base) MCG/ACT inhaler Commonly known as:  PROVENTIL HFA;VENTOLIN HFA Inhale 2 puffs into the lungs every 6 (six) hours as needed for wheezing or shortness of breath. Only use with chest tightness.   aspirin EC 81 MG tablet Take 1 tablet (81 mg total) daily by mouth.   atorvastatin 80 MG tablet Commonly known as:  LIPITOR Take 1 tablet (80 mg total) by mouth daily at 6 PM. What changed:  when to take this   buPROPion 150 MG 24 hr tablet Commonly known as:  WELLBUTRIN XL Take 1 tablet (150 mg total) by mouth daily.   camphor-menthol lotion Commonly known as:  SARNA Apply  topically 4 (four) times daily -  with meals and at bedtime.   clopidogrel 75 MG tablet Commonly known as:  PLAVIX Take 1 tablet (75 mg total) by mouth daily.   feeding supplement (ENSURE ENLIVE) Liqd Take 237 mLs by mouth 2 (two) times daily between meals.   Fluticasone-Umeclidin-Vilant 100-62.5-25 MCG/INH Aepb Commonly known as:  TRELEGY ELLIPTA Inhale 1 puff daily into the lungs.   folic acid 1 MG tablet Commonly known as:  FOLVITE Take 1 tablet (1 mg total) by mouth daily.   indapamide 1.25 MG tablet Commonly known as:  LOZOL Take 1 tablet (1.25 mg total) by mouth daily.   ipratropium-albuterol 0.5-2.5 (3) MG/3ML Soln Commonly known as:  DUONEB Take 3 mLs by nebulization every 6 (six) hours as needed.   lisinopril 2.5 MG tablet Commonly known as:  PRINIVIL,ZESTRIL Take 1 tablet (2.5 mg total) by mouth daily.   loratadine 10 MG tablet Commonly known as:  CLARITIN Take 1 tablet (10 mg total) by mouth daily.   metFORMIN 500 MG tablet Commonly known as:  GLUCOPHAGE Take 1 tablet (500 mg total) by mouth daily with breakfast.   multivitamin with minerals Tabs tablet Take 1 tablet by mouth daily.   nicotine 21 mg/24hr patch Commonly known as:  NICODERM CQ - dosed in mg/24 hours Place 1 patch (21 mg total) onto the skin daily.   nicotine polacrilex 4 MG lozenge Commonly known as:  COMMIT Take 1 lozenge (4 mg total) by mouth every 2 (two) hours as needed for smoking cessation.   pantoprazole 40 MG tablet Commonly known as:  PROTONIX Take 1 tablet (40 mg total) by mouth daily. Replaces:  omeprazole 40 MG capsule   polyethylene glycol packet Commonly known as:  MIRALAX / GLYCOLAX Take 17 g by mouth 2 (two) times daily.   senna-docusate 8.6-50 MG tablet Commonly known as:  Senokot-S Take 2 tablets by mouth 2 (two) times daily.   sucralfate 1 GM/10ML suspension Commonly known as:  CARAFATE Take 10 mLs (1 g total) by mouth 4 (four) times daily -  with meals and at  bedtime.   thiamine 100 MG tablet Take 1 tablet (100 mg total) by mouth daily.   triamcinolone 0.1 % cream : eucerin Crea Apply 1 application topically 2 (two) times daily.   umeclidinium bromide 62.5 MCG/INH Aepb Commonly known as:  INCRUSE ELLIPTA Inhale 1 puff into the lungs daily.      Contact information for after-discharge care    [facility] Follow up.   Service:  Skilled Nursing Contact information: [address] [place] [phone]  Signed: Bary Leriche 08/14/2017, 10:39 AM

## 2017-08-10 NOTE — Progress Notes (Signed)
Physical Therapy Weekly Progress Note  Patient Details  Name: [name] MRN: [id number] Date of Birth: [birth year]  Beginning of progress report period: August 03, 2017 End of progress report period: August 10, 2017  Today's Date: 08/10/2017 PT Individual Time: 0830-0925 PT Individual Time Calculation (min): 55 min    Pt agreeable to therapy. Pt performs all transfers with supervision for safety.  Gait with RW 150' x 2 with min A, cues for attention to Rt LE to prevent toe drag.  Obstacle negotiation with RW with min A.  Orthotist present to assess pt for AFO.  Decided on walk on AFO to assist with DF and help prevent knee hyperextension.  Pt able to perform obstacle negotiation without AD and side and backward walking without AD with min /mod A.  Pt performs sit to stand 2 x 10 with Lt LE on dynadisc to work on [place] stance control with min guard.  Pt left in room with needs at hand, respiratory therapist present, bed alarm on.  Patient has met 2 of 2 short term goals.  Pt improving balance, coordination and gait.  Still requires min A for safety with gait and is supervision with transfers.  Pt limited by decreased safety awareness, delayed balance reactions, decreased coordination.  Patient continues to demonstrate the following deficits muscle weakness, impaired timing and sequencing, ataxia and decreased coordination, decreased attention, decreased awareness and decreased safety awareness and decreased standing balance and decreased balance strategies and therefore will continue to benefit from skilled PT intervention to increase functional independence with mobility.  Patient progressing toward long term goals..  Plan of care revisions: d/c plan changed to SNF.  PT Short Term Goals Week 2:  PT Short Term Goal 1 (Week 2): pt will demo gait in home environment with min A x 25' PT Short Term Goal 1 - Progress (Week 2): Met PT Short Term Goal 2 (Week 2): pt will perform stair negotiation x  2 steps without handrails with min A PT Short Term Goal 2 - Progress (Week 2): Met Week 3:  PT Short Term Goal 1 (Week 3): = LTG  Skilled Therapeutic Interventions/Progress Updates:  Ambulation/gait training;Discharge planning;Functional mobility training;Therapeutic Activities;Balance/vestibular training;Neuromuscular re-education;Therapeutic Exercise;Wheelchair propulsion/positioning;UE/LE Strength taining/ROM;Splinting/orthotics;Pain management;DME/adaptive equipment instruction;Cognitive remediation/compensation;Community reintegration;Patient/family education;Stair training;UE/LE Coordination activities;Functional electrical stimulation;Psychosocial support     See Function Navigator for Current Functional Status.  Therapy/Group: Individual Therapy  [name] 08/10/2017, 9:24 AM

## 2017-08-10 NOTE — Progress Notes (Signed)
Physical Therapy Session Note  Patient Details  Name: [name] MRN: [id number] Date of Birth: Oct 13, 1955  Today's Date: 08/10/2017 PT Individual Time: 0830-0925 PT Individual Time Calculation (min): 55 min   Short Term Goals: Week 3:  PT Short Term Goal 1 (Week 3): = LTG  Skilled Therapeutic Interventions/Progress Updates:  Ambulation/gait training;Discharge planning;Functional mobility training;Therapeutic Activities;Balance/vestibular training;Neuromuscular re-education;Therapeutic Exercise;Wheelchair propulsion/positioning;UE/LE Strength taining/ROM;Splinting/orthotics;Pain management;DME/adaptive equipment instruction;Cognitive remediation/compensation;Community reintegration;Patient/family education;Stair training;UE/LE Coordination activities;Functional electrical stimulation;Psychosocial support   Therapy Documentation Precautions:  Precautions Precautions: Fall Restrictions Weight Bearing Restrictions: No General:   Vital Signs: Oxygen Therapy SpO2: 99 % O2 Device: Room Air Pain:   Mobility:   Locomotion :    Trunk/Postural Assessment :    Balance:   Exercises:   Other Treatments:     See Function Navigator for Current Functional Status.   Therapy/Group: Individual Therapy  Waunita Schooner 08/10/2017, 12:27 PM

## 2017-08-10 NOTE — Progress Notes (Signed)
Occupational Therapy Session Note  Patient Details  Name: [name] MRN: [id number] Date of Birth: 06/30/1955  Today's Date: 08/10/2017 OT Individual Time: 1000-1058 OT Individual Time Calculation (min): 58 min    Short Term Goals: Week 2:  OT Short Term Goal 1 (Week 2): Pt will complete bathing at sit > stand level with supervision OT Short Term Goal 2 (Week 2): Pt will complete LB dressing at sit > stand level with supervision OT Short Term Goal 3 (Week 2): Pt will complete toilet transfers with min assist with LRAD OT Short Term Goal 4 (Week 2): Pt will complete 2 grooming tasks in standing with supervision  Skilled Therapeutic Interventions/Progress Updates:    OT treatment session focused on modified bathing/dressing, activity tolerance, and R NMR. Pt came to sitting EOB with supervision, then needed cues for hand placement for safe sit<>stand with RW. Pt ambulated to bathroom with RW and, CGA, and min cues for RW placement for safe access into walk-in shower. Bathing completed with supervision and min cues to integrate R UE. Worked on dynamic standing balance and endurance for standing grooming tasks at the sink with close supervision for ~5 mins. Pt brought to therapy gym and into quadruped position on therapy mat. Pt completed card matching activity using R and L UE focus on weight shifting, crawling, and weight bearing for neuro re-ed. Pt returned to bed at end of session and left supine in bed with needs met and bed alarm on.   Therapy Documentation Precautions:  Precautions Precautions: Fall Restrictions Weight Bearing Restrictions: No Pain:   none/denies pain See Function Navigator for Current Functional Status.   Therapy/Group: Individual Therapy  Valma Cava 08/10/2017, 10:16 AM

## 2017-08-11 ENCOUNTER — Inpatient Hospital Stay (HOSPITAL_COMMUNITY): Payer: Medicare HMO | Admitting: Occupational Therapy

## 2017-08-11 LAB — GLUCOSE, CAPILLARY
GLUCOSE-CAPILLARY: 109 mg/dL — AB (ref 65–99)
Glucose-Capillary: 98 mg/dL (ref 65–99)
Glucose-Capillary: 107 mg/dL — ABNORMAL HIGH (ref 65–99)
Glucose-Capillary: 116 mg/dL — ABNORMAL HIGH (ref 65–99)

## 2017-08-11 NOTE — Progress Notes (Signed)
Occupational Therapy Session Note  Patient Details  Name: [name] MRN: [id number] Date of Birth: 08/09/1955  Today's Date: 08/11/2017 OT Individual Time: 1330-1400 OT Individual Time Calculation (min): 30 min    Short Term Goals: Week 2:  OT Short Term Goal 1 (Week 2): Pt will complete bathing at sit > stand level with supervision OT Short Term Goal 1 - Progress (Week 2): Met OT Short Term Goal 2 (Week 2): Pt will complete LB dressing at sit > stand level with supervision OT Short Term Goal 2 - Progress (Week 2): Met OT Short Term Goal 3 (Week 2): Pt will complete toilet transfers with min assist with LRAD OT Short Term Goal 3 - Progress (Week 2): Met OT Short Term Goal 4 (Week 2): Pt will complete 2 grooming tasks in standing with supervision OT Short Term Goal 4 - Progress (Week 2): Met  Skilled Therapeutic Interventions/Progress Updates: skilled OT in which patient participated this session: - bed mobility=supervision -don and doff tredded socks and shoes, including right shoe with built in AFO (close S while sitting EOB and reaching forward) -right upper extremity and lower extremity weight bearing -dynamic sitting balance EOB  At session's end, patient elected to get back in bed. Call bell within reach and bed alarm engaged     Therapy Documentation Precautions:  Precautions Precautions: Fall Restrictions Weight Bearing Restrictions: No   Pain:denied    Therapy/Group: Individual Therapy  ,  [place] 08/11/2017, 9:32 PM  

## 2017-08-11 NOTE — Progress Notes (Signed)
[facility]ICINE [facility] RE[facility]ABILITATION     PROGRESS NOTE  Subjective/Complaints:  No new issues.  Slept well.  ROS: Patient denies fever, rash, sore throat, blurred vision, nausea, vomiting, diarrhea, cough, shortness of breath or chest pain, joint or back pain, headache, or mood change.   Objective: Vital Signs: Blood pressure 123/60, pulse (!) 105, temperature 97.8 F (36.6 C), temperature source Oral, resp. rate 20, height 5\' 7"  (1.702 m), weight 61.2 kg (134 lb 14.7 oz), SpO2 99 %. No results found. Recent Labs    08/09/17 0647  WBC 7.7  HGB 13.9  HCT 40.6  PLT 229   Recent Labs    08/09/17 0647  NA 133*  K 3.9  CL 99*  GLUCOSE 112*  BUN 26*  CREATININE 0.98  CALCIUM 9.2   CBG (last 3)  Recent Labs    08/10/17 1635 08/10/17 2034 08/11/17 0652  GLUCAP 117* 135* 109*    Wt Readings from Last 3 Encounters:  08/08/17 61.2 kg (134 lb 14.7 oz)  07/22/17 66.8 kg (147 lb 4.3 oz)  06/28/17 68.9 kg (151 lb 12.8 oz)    Physical Exam:  BP 123/60 (BP Location: Right Arm)   Pulse (!) 105   Temp 97.8 F (36.6 C) (Oral)   Resp 20   Ht 5\' 7"  (1.702 m)   Wt 61.2 kg (134 lb 14.7 oz)   SpO2 99%   BMI 21.13 kg/m  Constitutional: No distress . Vital signs reviewed. HEENT: EOMI, oral membranes moist Cardiovascular: RRR without murmur. No JVD   Respiratory: CTA Bilaterally without wheezes or rales. Normal effort    GI: BS +, non-tender, non-distended  Musculoskeletal: He exhibits noedema, no tenderness.  Neurological: He isalertand oriented.  Motor: LUE/LLE: 4+/5 proximal to distal 4+/5 right delt bi tri grip 4/5 right HF 4/5 KE, 4+/5 R ankle DF (stable motor exam)  Skin: Skin iswarmand dry. He isnot diaphoretic.  Psychiatric: Slightly impulsive and irritable.  Assessment/Plan: 1. Functional deficits secondary to left Basal ganglia and pontine infarcts which require 3+ hours per day of interdisciplinary therapy in a comprehensive inpatient rehab  setting. Physiatrist is providing close team supervision and 24 hour management of active medical problems listed below. Physiatrist and rehab team continue to assess barriers to discharge/monitor patient progress toward functional and medical goals.  Function:  Bathing Bathing position   Position: Shower  Bathing parts Body parts bathed by patient: Right arm, Chest, Abdomen, Front perineal area, Right upper leg, Left upper leg, Left arm, Buttocks, Right lower leg, Left lower leg Body parts bathed by helper: Back  Bathing assist Assist Level: Supervision or verbal cues      Upper Body Dressing/Undressing Upper body dressing   What is the patient wearing?: Pull over shirt/dress     Pull over shirt/dress - Perfomed by patient: Thread/unthread right sleeve, Thread/unthread left sleeve, Put head through opening, Pull shirt over trunk Pull over shirt/dress - Perfomed by helper: Thread/unthread right sleeve        Upper body assist Assist Level: Supervision or verbal cues      Lower Body Dressing/Undressing Lower body dressing   What is the patient wearing?: Pants, Non-skid slipper socks Underwear - Performed by patient: Thread/unthread right underwear leg, Thread/unthread left underwear leg, Pull underwear up/down   Pants- Performed by patient: Thread/unthread right pants leg, Thread/unthread left pants leg, Pull pants up/down Pants- Performed by helper: Thread/unthread right pants leg Non-skid slipper socks- Performed by patient: Don/doff left sock, Don/doff right sock  Non-skid slipper socks- Performed by helper: Don/doff right sock, Don/doff left sock     Shoes - Performed by patient: Don/doff right shoe, Don/doff left shoe, Fasten right, Fasten left            Lower body assist Assist for lower body dressing: Supervision or verbal cues      Toileting Toileting   Toileting steps completed by patient: Adjust clothing prior to toileting, Performs perineal hygiene, Adjust  clothing after toileting Toileting steps completed by helper: Performs perineal hygiene Toileting Assistive Devices: Grab bar or rail  Toileting assist Assist level: Touching or steadying assistance (Pt.75%)   Transfers Chair/bed transfer   Chair/bed transfer method: Stand pivot Chair/bed transfer assist level: Touching or steadying assistance (Pt > 75%) Chair/bed transfer assistive device: Medical sales representative     Max distance: >150 Assist level: Touching or steadying assistance (Pt > 75%)   Wheelchair   Type: Manual Max wheelchair distance: 200 Assist Level: Supervision or verbal cues  Cognition Comprehension Comprehension assist level: Follows complex conversation/direction with extra time/assistive device  Expression Expression assist level: Expresses basic needs/ideas: With no assist  Social Interaction Social Interaction assist level: Interacts appropriately with others - No medications needed.  Problem Solving Problem solving assist level: Solves basic 90% of the time/requires cueing < 10% of the time  Memory Memory assist level: Recognizes or recalls 75 - 89% of the time/requires cueing 10 - 24% of the time    Medical Problem List and Plan: 1. 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 DVT Prophylaxis/Anticoagulation: Pharmaceutical:Lovenox 3. Pain Management:N/A 4. Mood:LCSW to follow for evaluation and support. 5. Neuropsych: This patientis not fullycapable of making decisions on hisown behalf.   Tele-sitter--consider removing  Monday. 6.Rash/Skin/Wound Care:Continue Triamcinolone cream tid. Monitor rash for healing. Maintain adequate nutritional and hydration status. 7. Fluids/Electrolytes/Nutrition:Monitor I/O.  8. HTN: Monitor BP bid. On Prinivil, indapamide,   Vitals:   08/10/17 1430 08/11/17 0414  BP: 117/66 123/60  Pulse: 93 (!) 105  Resp: 20 20  Temp: 97.6 F (36.4 C) 97.8  F (36.6 C)  SpO2: 100% 99%    Controlled on 3/16 9. CAD:On ASA and lipitor. 10. H/o Hep C:No meds 11. COPD/Lung CA s/p XRT: On Breo and Ellipta daily. 12. Depression: On Wellbutrin XL. 13. Constipation: Increased MiraLAX to twice a day    Increased bowel reg on 3/5, again on 3/6   Moved bowels on 3/14 .   UA/U culture relatively unremarkable   Improving  14. Itching/rash: Due to recent bed bug infestation. Added Sarna lotion. Improving 15. Prediabetes   Cont to monitor   CBGs   SSI ordered, added qhs SSI   Diet changed to Carb modified   Metformin 250 started on 3/12, increased to 500 on 3/14  Good control as of 08/11/2017 16. Hyponatremia   Na 133 on 3/13   Cont to monitor 17. AKI: Resolved   Cr 0.98 on 3/14   Continue to push fluids   IVF DC'd on 3/7  LOS (Days) 16 A FACE TO FACE EVALUATION WAS PERFORMED  Meredith Staggers 08/11/2017 8:23 AM

## 2017-08-11 NOTE — Plan of Care (Signed)
  Progressing Consults RH STROKE PATIENT EDUCATION Description See Patient Education module for education specifics  08/11/2017 1434 - Progressing by Claude Manges, LPN RH BOWEL ELIMINATION RH STG MANAGE BOWEL WITH ASSISTANCE Description STG Manage Bowel with mod I Assistance.   08/11/2017 1434 - Progressing by Claude Manges, LPN RH STG MANAGE BOWEL W/MEDICATION W/ASSISTANCE Description STG Manage Bowel with Medication with mod I  Assistance.   08/11/2017 1434 - Progressing by Claude Manges, LPN RH SKIN INTEGRITY RH STG SKIN FREE OF INFECTION/BREAKDOWN Description Patients skin will remain free from further breakdown or infection with min assist.  08/11/2017 1434 - Progressing by Claude Manges, LPN RH SAFETY RH STG ADHERE TO SAFETY PRECAUTIONS W/ASSISTANCE/DEVICE Description STG Adhere to Safety Precautions With min Assistance/Device.  08/11/2017 1434 - Progressing by Claude Manges, LPN RH COGNITION-NURSING RH STG USES MEMORY AIDS/STRATEGIES W/ASSIST TO PROBLEM SOLVE Description STG Uses Memory Aids/Strategies With min Assistance to Problem Solve.  08/11/2017 1434 - Progressing by Claude Manges, LPN RH KNOWLEDGE DEFICIT RH STG INCREASE KNOWLEDGE OF HYPERTENSION Description Pt will be able to explain [facility] diet, management of HTN and rationale for changes to decrease risk of stroke with resources/cues/supervision  08/11/2017 1434 - Progressing by Claude Manges, LPN

## 2017-08-11 NOTE — Progress Notes (Signed)
Occupational Therapy Session Note  Patient Details  Name: [name] MRN: [id number] Date of Birth: November 23, 1955  Today's Date: 08/11/2017 OT Individual Time: 5329-9242 OT Individual Time Calculation (min): 37 min   Skilled Therapeutic Interventions/Progress Updates:    Pt greeted supine in bed. Requesting to go outside. Tx focus on functional transfers, DME safety, and UB strengthening. He donned sneakers including Rt foot brace EOB with extra time. 1 seated LOB backwards with pt able to self correct without assist. Stand pivot<w/c completed with Min A, and then he was escorted to outdoor community setting. Pt ambulating with RW and Min A over uneven terrain, around environmental obstacles, and transferred to community bench with cues for safe walker mgt. He also propelled w/c in similar manner with cues to attend to R UE to ensure it was contacting wheel. Pt able to change directions and go up inclines with supervision, actively pumping with R UE(!). Afterwards pt was returned to room and completed stand pivot back to bed. He removed his shoes and transferred to supine. Pt left with all needs within reach and bed alarm set at session exit.   Therapy Documentation Precautions:  Precautions Precautions: Fall Restrictions Weight Bearing Restrictions: No Vital Signs: Therapy Vitals Temp: 98.1 F (36.7 C) Temp Source: Oral Pulse Rate: 99 Resp: 20 BP: 115/71 Patient Position (if appropriate): Lying Oxygen Therapy SpO2: 98 % O2 Device: Room Air Pain: No c/o pain during session    ADL:       See Function Navigator for Current Functional Status.   Therapy/Group: Individual Therapy  Makilah Dowda A Enya Bureau 08/11/2017, 4:51 PM

## 2017-08-12 ENCOUNTER — Inpatient Hospital Stay (HOSPITAL_COMMUNITY): Payer: Medicare HMO

## 2017-08-12 LAB — GLUCOSE, CAPILLARY
GLUCOSE-CAPILLARY: 153 mg/dL — AB (ref 65–99)
Glucose-Capillary: 102 mg/dL — ABNORMAL HIGH (ref 65–99)
Glucose-Capillary: 141 mg/dL — ABNORMAL HIGH (ref 65–99)

## 2017-08-12 NOTE — Progress Notes (Signed)
Physical Therapy Session Note  Patient Details  Name: [name] MRN: [id number] Date of Birth: Jan 24, 1956  Today's Date: 08/12/2017 PT Missed Time: 36 Minutes Missed Time Reason: Patient unwilling to participate   Skilled Therapeutic Interventions/Progress Updates:    Pt missed 30 minutes of skilled therapy tx this session. Pt unwilling to participate, states he just wants to go back to sleep and it is too early.     See Function Navigator for Current Functional Status.   Therapy/Group: Individual Therapy  Netta Corrigan, PT, DPT 08/12/2017, 11:53 AM

## 2017-08-12 NOTE — Plan of Care (Signed)
  Progressing Consults RH STROKE PATIENT EDUCATION Description See Patient Education module for education specifics  08/12/2017 1318 - Progressing by Claude Manges, LPN RH BOWEL ELIMINATION RH STG MANAGE BOWEL WITH ASSISTANCE Description STG Manage Bowel with mod I Assistance.   08/12/2017 1318 - Progressing by Claude Manges, LPN RH STG MANAGE BOWEL W/MEDICATION W/ASSISTANCE Description STG Manage Bowel with Medication with mod I  Assistance.   08/12/2017 1318 - Progressing by Claude Manges, LPN RH SKIN INTEGRITY RH STG SKIN FREE OF INFECTION/BREAKDOWN Description Patients skin will remain free from further breakdown or infection with min assist.  08/12/2017 1318 - Progressing by Claude Manges, LPN RH SAFETY RH STG ADHERE TO SAFETY PRECAUTIONS W/ASSISTANCE/DEVICE Description STG Adhere to Safety Precautions With min Assistance/Device.  08/12/2017 1318 - Progressing by Claude Manges, LPN RH COGNITION-NURSING RH STG USES MEMORY AIDS/STRATEGIES W/ASSIST TO PROBLEM SOLVE Description STG Uses Memory Aids/Strategies With min Assistance to Problem Solve.  08/12/2017 1318 - Progressing by Claude Manges, LPN RH KNOWLEDGE DEFICIT RH STG INCREASE KNOWLEDGE OF HYPERTENSION Description Pt will be able to explain [facility] diet, management of HTN and rationale for changes to decrease risk of stroke with resources/cues/supervision  08/12/2017 1318 - Progressing by Claude Manges, LPN

## 2017-08-12 NOTE — Progress Notes (Signed)
[facility] PHYSICAL MEDICINE & REHABILITATION     PROGRESS NOTE  Subjective/Complaints:  Patient denies any pain.  Slept well last night.  ROS: Patient denies fever, rash, sore throat, blurred vision, nausea, vomiting, diarrhea, cough, shortness of breath or chest pain, joint or back pain, headache, or mood change.   Objective: Vital Signs: Blood pressure 110/67, pulse 89, temperature 97.9 F (36.6 C), temperature source Oral, resp. rate 18, height 5\' 7"  (1.702 m), weight 61.2 kg (134 lb 14.7 oz), SpO2 100 %. No results found. No results for input(s): WBC, HGB, HCT, PLT in the last 72 hours. No results for input(s): NA, K, CL, GLUCOSE, BUN, CREATININE, CALCIUM in the last 72 hours.  Invalid input(s): CO CBG (last 3)  Recent Labs    08/11/17 1126 08/11/17 1633 08/11/17 2217  GLUCAP 98 107* 116*    Wt Readings from Last 3 Encounters:  08/08/17 61.2 kg (134 lb 14.7 oz)  07/22/17 66.8 kg (147 lb 4.3 oz)  06/28/17 68.9 kg (151 lb 12.8 oz)    Physical Exam:  BP 110/67 (BP Location: Right Arm)   Pulse 89   Temp 97.9 F (36.6 C) (Oral)   Resp 18   Ht 5\' 7"  (1.702 m)   Wt 61.2 kg (134 lb 14.7 oz)   SpO2 100%   BMI 21.13 kg/m  Constitutional: No distress . Vital signs reviewed. HEENT: EOMI, oral membranes moist Cardiovascular: RRR without murmur. No JVD    Respiratory: CTA Bilaterally without wheezes or rales. Normal effort     GI: BS +, non-tender, non-distended  Musculoskeletal: He exhibits noedema, no tenderness.  Neurological: He isalertand oriented.  Motor: LUE/LLE: 4+/5 proximal to distal 4+/5 right delt bi tri grip 4/5 right HF 4/5 KE, 4+/5 R ankle DF (stable motor exam)  Skin: Skin iswarmand dry. He isnot diaphoretic.  Psychiatric: Slightly impulsive and irritable.  Assessment/Plan: 1. Functional deficits secondary to left Basal ganglia and pontine infarcts which require 3+ hours per day of interdisciplinary therapy in a comprehensive inpatient rehab  setting. Physiatrist is providing close team supervision and 24 hour management of active medical problems listed below. Physiatrist and rehab team continue to assess barriers to discharge/monitor patient progress toward functional and medical goals.  Function:  Bathing Bathing position   Position: Shower  Bathing parts Body parts bathed by patient: Right arm, Chest, Abdomen, Front perineal area, Right upper leg, Left upper leg, Left arm, Buttocks, Right lower leg, Left lower leg Body parts bathed by helper: Back  Bathing assist Assist Level: Supervision or verbal cues      Upper Body Dressing/Undressing Upper body dressing   What is the patient wearing?: Pull over shirt/dress     Pull over shirt/dress - Perfomed by patient: Thread/unthread right sleeve, Thread/unthread left sleeve, Put head through opening, Pull shirt over trunk Pull over shirt/dress - Perfomed by helper: Thread/unthread right sleeve        Upper body assist Assist Level: Supervision or verbal cues      Lower Body Dressing/Undressing Lower body dressing   What is the patient wearing?: Shoes, AFO Underwear - Performed by patient: Thread/unthread right underwear leg, Thread/unthread left underwear leg, Pull underwear up/down   Pants- Performed by patient: Thread/unthread right pants leg, Thread/unthread left pants leg, Pull pants up/down Pants- Performed by helper: Thread/unthread right pants leg Non-skid slipper socks- Performed by patient: Don/doff left sock, Don/doff right sock Non-skid slipper socks- Performed by helper: Don/doff right sock, Don/doff left sock     Shoes -  Performed by patient: Don/doff right shoe, Don/doff left shoe, Fasten right, Fasten left   AFO - Performed by patient: Don/doff right AFO        Lower body assist Assist for lower body dressing: Supervision or verbal cues      Toileting Toileting   Toileting steps completed by patient: Adjust clothing prior to toileting, Performs  perineal hygiene, Adjust clothing after toileting Toileting steps completed by helper: Performs perineal hygiene Toileting Assistive Devices: Grab bar or rail  Toileting assist Assist level: Touching or steadying assistance (Pt.75%)   Transfers Chair/bed transfer   Chair/bed transfer method: Stand pivot Chair/bed transfer assist level: Touching or steadying assistance (Pt > 75%) Chair/bed transfer assistive device: Medical sales representative     Max distance: >150 Assist level: Touching or steadying assistance (Pt > 75%)   Wheelchair   Type: Manual Max wheelchair distance: 200 Assist Level: Supervision or verbal cues  Cognition Comprehension Comprehension assist level: Follows complex conversation/direction with extra time/assistive device  Expression Expression assist level: Expresses basic 90% of the time/requires cueing < 10% of the time.  Social Interaction Social Interaction assist level: Interacts appropriately 75 - 89% of the time - Needs redirection for appropriate language or to initiate interaction.  Problem Solving Problem solving assist level: Solves basic 90% of the time/requires cueing < 10% of the time  Memory Memory assist level: Recognizes or recalls 75 - 89% of the time/requires cueing 10 - 24% of the time    Medical Problem List and Plan: 1. 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 DVT Prophylaxis/Anticoagulation: Pharmaceutical:Lovenox 3. Pain Management:N/A 4. Mood:LCSW to follow for evaluation and support. 5. Neuropsych: This patientis not fullycapable of making decisions on hisown behalf.   Tele-sitter--consider removing  Monday. Will d/w team 6.Rash/Skin/Wound Care:Continue Triamcinolone cream tid. Monitor rash for healing. Maintain adequate nutritional and hydration status. 7. Fluids/Electrolytes/Nutrition:Monitor I/O.  8. HTN: Monitor BP bid. On Prinivil, indapamide,    Vitals:   08/11/17 1433 08/12/17 0515  BP: 115/71 110/67  Pulse: 99 89  Resp: 20 18  Temp: 98.1 F (36.7 C) 97.9 F (36.6 C)  SpO2: 98% 100%    Controlled on 3/17 9. CAD:On ASA and lipitor. 10. H/o Hep C:No meds 11. COPD/Lung CA s/p XRT: On Breo and Ellipta daily. 12. Depression: On Wellbutrin XL. 13. Constipation: Increased MiraLAX to twice a day    Increased bowel reg on 3/5, again on 3/6   Moved bowels on 3/14 .   UA/U culture relatively unremarkable   Improving  14. Itching/rash: Due to recent bed bug infestation. Added Sarna lotion. Improving 15. Prediabetes   Cont to monitor   CBGs   SSI ordered, added qhs SSI   Diet changed to Carb modified   Metformin 250 started on 3/12, increased to 500 on 3/14  Good control as of 08/12/2017 16. Hyponatremia   Na 133 on 3/13   Cont to monitor 17. AKI: Resolved   Cr 0.98 on 3/14   Continue to push fluids   IVF DC'd on 3/7  LOS (Days) 17 A FACE TO FACE EVALUATION WAS PERFORMED  Meredith Staggers 08/12/2017 7:50 AM

## 2017-08-12 NOTE — Progress Notes (Signed)
Occupational Therapy Session Note  Patient Details  Name: [name] MRN: [id number] Date of Birth: 1956-05-21  Today's Date: 08/12/2017 OT Individual Time: 1545-1640 OT Individual Time Calculation (min): 55 min    Short Term Goals: Week 2:  OT Short Term Goal 1 (Week 2): Pt will complete bathing at sit > stand level with supervision OT Short Term Goal 1 - Progress (Week 2): Met OT Short Term Goal 2 (Week 2): Pt will complete LB dressing at sit > stand level with supervision OT Short Term Goal 2 - Progress (Week 2): Met OT Short Term Goal 3 (Week 2): Pt will complete toilet transfers with min assist with LRAD OT Short Term Goal 3 - Progress (Week 2): Met OT Short Term Goal 4 (Week 2): Pt will complete 2 grooming tasks in standing with supervision OT Short Term Goal 4 - Progress (Week 2): Met  Skilled Therapeutic Interventions/Progress Updates:    1;1. Pt with no c/o pain. Pt dons B shoes with A for R shoe/AFO. Pt ambulates with min guard to/from tx gym with VC for stepping decreasing scissoring of RLE. Pt stands at high low table to complete pipe tree activity with mod question cues for noticing/problem solving difference in picture vs. Built figure. Pt becomes easily frustrated, but persists with encouragement. Pt completes 9HPT with VC for picking up one peg at a time RUE 1 min6 seconds LUE 44 seconds. Pt stands to play 3 rounds of cornhole with touching A and 1 LOB to R with touching A to recover. Exited session with pt seated in w/c call light in reach, exit alarm on and all needs met.  Therapy Documentation Precautions:  Precautions Precautions: Fall Restrictions Weight Bearing Restrictions: No  See Function Navigator for Current Functional Status.   Therapy/Group: Individual Therapy  [name] 08/12/2017, 4:41 PM

## 2017-08-12 NOTE — Plan of Care (Signed)
  RH SAFETY RH STG ADHERE TO SAFETY PRECAUTIONS W/ASSISTANCE/DEVICE Description STG Adhere to Safety Precautions With min Assistance/Device.  08/12/2017 0503 - Progressing by Lysbeth Penner D, RN  Assist pt with bathroom needs, call light at reach. Remain no falls/injuries

## 2017-08-13 ENCOUNTER — Inpatient Hospital Stay (HOSPITAL_COMMUNITY): Payer: Medicare HMO | Admitting: Occupational Therapy

## 2017-08-13 ENCOUNTER — Inpatient Hospital Stay (HOSPITAL_COMMUNITY): Payer: Medicare HMO

## 2017-08-13 ENCOUNTER — Inpatient Hospital Stay (HOSPITAL_COMMUNITY): Payer: Medicare HMO | Admitting: Physical Therapy

## 2017-08-13 LAB — GLUCOSE, CAPILLARY
Glucose-Capillary: 122 mg/dL — ABNORMAL HIGH (ref 65–99)
Glucose-Capillary: 101 mg/dL — ABNORMAL HIGH (ref 65–99)
Glucose-Capillary: 160 mg/dL — ABNORMAL HIGH (ref 65–99)

## 2017-08-13 MED ORDER — CAMPHOR-MENTHOL 0.5-0.5 % EX LOTN: TOPICAL_LOTION | Freq: Three times a day (TID) | CUTANEOUS | 0 refills | Status: DC

## 2017-08-13 MED ORDER — FOLIC ACID 1 MG PO TABS: 1.0000 mg | ORAL_TABLET | Freq: Every day | ORAL | Status: DC

## 2017-08-13 MED ORDER — POLYETHYLENE GLYCOL 3350 17 G PO PACK: 17.0000 g | PACK | Freq: Two times a day (BID) | ORAL | 0 refills | Status: DC

## 2017-08-13 MED ORDER — THIAMINE HCL 100 MG PO TABS: 100.0000 mg | ORAL_TABLET | Freq: Every day | ORAL | Status: DC

## 2017-08-13 MED ORDER — ACETAMINOPHEN 325 MG PO TABS: 325.0000 mg | ORAL_TABLET | ORAL | Status: DC | PRN

## 2017-08-13 MED ORDER — SUCRALFATE 1 GM/10ML PO SUSP: 1.0000 g | Freq: Three times a day (TID) | ORAL | 0 refills | Status: DC

## 2017-08-13 MED ORDER — SENNOSIDES-DOCUSATE SODIUM 8.6-50 MG PO TABS: 2.0000 | ORAL_TABLET | Freq: Two times a day (BID) | ORAL | Status: DC

## 2017-08-13 MED ORDER — ATORVASTATIN CALCIUM 80 MG PO TABS: 80.0000 mg | ORAL_TABLET | Freq: Every day | ORAL | Status: DC

## 2017-08-13 MED ORDER — TRIAMCINOLONE 0.1 % CREAM:EUCERIN CREAM 1:1: 1.0000 "application " | TOPICAL_CREAM | Freq: Two times a day (BID) | CUTANEOUS | Status: DC

## 2017-08-13 MED ORDER — IPRATROPIUM-ALBUTEROL 0.5-2.5 (3) MG/3ML IN SOLN: 3.0000 mL | Freq: Four times a day (QID) | RESPIRATORY_TRACT | Status: DC | PRN

## 2017-08-13 MED ORDER — METFORMIN HCL 500 MG PO TABS: 500.0000 mg | ORAL_TABLET | Freq: Every day | ORAL | Status: DC

## 2017-08-13 MED ORDER — ENSURE ENLIVE PO LIQD: 237.0000 mL | Freq: Two times a day (BID) | ORAL | 12 refills | Status: DC

## 2017-08-13 MED ORDER — LORATADINE 10 MG PO TABS
10.0000 mg | ORAL_TABLET | Freq: Every day | ORAL | Status: DC
Start: 2017-08-14 — End: 2017-09-26

## 2017-08-13 MED ORDER — ADULT MULTIVITAMIN W/MINERALS CH: 1.0000 | ORAL_TABLET | Freq: Every day | ORAL | Status: DC

## 2017-08-13 MED ORDER — PANTOPRAZOLE SODIUM 40 MG PO TBEC: 40.0000 mg | DELAYED_RELEASE_TABLET | Freq: Every day | ORAL | Status: DC

## 2017-08-13 NOTE — Plan of Care (Signed)
  Progressing Consults RH STROKE PATIENT EDUCATION Description See Patient Education module for education specifics  08/13/2017 1416 - Progressing by Claude Manges, LPN RH BOWEL ELIMINATION RH STG MANAGE BOWEL WITH ASSISTANCE Description STG Manage Bowel with mod I Assistance.   08/13/2017 1416 - Progressing by Claude Manges, LPN RH STG MANAGE BOWEL W/MEDICATION W/ASSISTANCE Description STG Manage Bowel with Medication with mod I  Assistance.   08/13/2017 1416 - Progressing by Claude Manges, LPN RH SKIN INTEGRITY RH STG SKIN FREE OF INFECTION/BREAKDOWN Description Patients skin will remain free from further breakdown or infection with min assist.  08/13/2017 1416 - Progressing by Claude Manges, LPN RH SAFETY RH STG ADHERE TO SAFETY PRECAUTIONS W/ASSISTANCE/DEVICE Description STG Adhere to Safety Precautions With min Assistance/Device.  08/13/2017 1416 - Progressing by Claude Manges, LPN RH COGNITION-NURSING RH STG USES MEMORY AIDS/STRATEGIES W/ASSIST TO PROBLEM SOLVE Description STG Uses Memory Aids/Strategies With min Assistance to Problem Solve.  08/13/2017 1416 - Progressing by Claude Manges, LPN RH KNOWLEDGE DEFICIT RH STG INCREASE KNOWLEDGE OF HYPERTENSION Description Pt will be able to explain [facility] diet, management of HTN and rationale for changes to decrease risk of stroke with resources/cues/supervision  08/13/2017 1416 - Progressing by Claude Manges, LPN

## 2017-08-13 NOTE — Progress Notes (Signed)
Physical Therapy Session Note  Patient Details  Name: [name] MRN: [id number] Date of Birth: [birth year]  Today's Date: 08/13/2017 PT Individual Time: 7530-0511 PT Individual Time Calculation (min): 27 min   Short Term Goals: Week 3:  PT Short Term Goal 1 (Week 3): = LTG  Skilled Therapeutic Interventions/Progress Updates:    Pt supine in bed upon PT arrival, encouragement needed for participation, agreeable to therapy tx. Pt transferred to sitting Mod I and donned shoes and AFO with supervision. Pt initially began ambulating without AD requiring min assist and then stated "Oh, yeah I think I do need that walker." Pt ambulated from room>gym x 100 ft with RW and supervision. Pt worked on dynamic standing balance to engage in game of horseshoes x 2 trials. Pt worked on seated balance and UE strength to hit beach ball back and forth with 3# dowel, x 3 trials. Pt ambulated back to room with RW and supervision, left supine in bed with needs in reach.  Therapy Documentation Precautions:  Precautions Precautions: Fall Restrictions Weight Bearing Restrictions: No  See Function Navigator for Current Functional Status.   Therapy/Group: Individual Therapy  Netta Corrigan, PT, DPT 08/13/2017, 3:41 PM

## 2017-08-13 NOTE — Progress Notes (Signed)
[facility]     PROGRESS NOTE  Subjective/Complaints:  No new complaints other than not sleeping too well.  ROS: Patient denies fever, rash, sore throat, blurred vision, nausea, vomiting, diarrhea, cough, shortness of breath or chest pain, joint or back pain, headache, or mood change.    Objective: Vital Signs: Blood pressure 127/79, pulse 84, temperature (!) 97 F (36.1 C), temperature source Oral, resp. rate 16, height 5\' 7"  (1.702 m), weight 61.2 kg (134 lb 14.7 oz), SpO2 98 %. No results found. No results for input(s): WBC, HGB, HCT, PLT in the last 72 hours. No results for input(s): NA, K, CL, GLUCOSE, BUN, CREATININE, CALCIUM in the last 72 hours.  Invalid input(s): CO CBG (last 3)  Recent Labs    08/12/17 1648 08/12/17 2100 08/13/17 0708  GLUCAP 102* 141* 122*    Wt Readings from Last 3 Encounters:  08/08/17 61.2 kg (134 lb 14.7 oz)  07/22/17 66.8 kg (147 lb 4.3 oz)  06/28/17 68.9 kg (151 lb 12.8 oz)    Physical Exam:  BP 127/79 (BP Location: Right Arm)   Pulse 84   Temp (!) 97 F (36.1 C) (Oral)   Resp 16   Ht 5\' 7"  (1.702 m)   Wt 61.2 kg (134 lb 14.7 oz)   SpO2 98%   BMI 21.13 kg/m  Constitutional: No distress . Vital signs reviewed. HEENT: EOMI, oral membranes moist Cardiovascular: RRR without murmur. No JVD    Respiratory: CTA Bilaterally without wheezes or rales. Normal effort    GI: BS +, non-tender, non-distended  Musculoskeletal: He exhibits noedema, no tenderness.  Neurological: He isalertand oriented.  Motor: LUE/LLE: 4+/5 proximal to distal 4+/5 right delt bi tri grip 4/5 right HF 4/5 KE, 4+/5 R ankle DF (stable exam)  Skin: Skin iswarmand dry. He isnot diaphoretic.  Psychiatric: pleasant and cooperative  Assessment/Plan: 1. Functional deficits secondary to left Basal ganglia and pontine infarcts which require 3+ hours per day of interdisciplinary therapy in a comprehensive inpatient rehab  setting. Physiatrist is providing close team supervision and 24 hour management of active medical problems listed below. Physiatrist and rehab team continue to assess barriers to discharge/monitor patient progress toward functional and medical goals.  Function:  Bathing Bathing position   Position: Shower  Bathing parts Body parts bathed by patient: Right arm, Chest, Abdomen, Front perineal area, Right upper leg, Left upper leg, Left arm, Buttocks, Right lower leg, Left lower leg Body parts bathed by helper: Back  Bathing assist Assist Level: Supervision or verbal cues      Upper Body Dressing/Undressing Upper body dressing   What is the patient wearing?: Pull over shirt/dress     Pull over shirt/dress - Perfomed by patient: Thread/unthread right sleeve, Thread/unthread left sleeve, Put head through opening, Pull shirt over trunk Pull over shirt/dress - Perfomed by helper: Thread/unthread right sleeve        Upper body assist Assist Level: Supervision or verbal cues      Lower Body Dressing/Undressing Lower body dressing   What is the patient wearing?: Shoes, AFO Underwear - Performed by patient: Thread/unthread right underwear leg, Thread/unthread left underwear leg, Pull underwear up/down   Pants- Performed by patient: Thread/unthread right pants leg, Thread/unthread left pants leg, Pull pants up/down Pants- Performed by helper: Thread/unthread right pants leg Non-skid slipper socks- Performed by patient: Don/doff left sock, Don/doff right sock Non-skid slipper socks- Performed by helper: Don/doff right sock, Don/doff left sock     Shoes -  Performed by patient: Don/doff right shoe, Don/doff left shoe, Fasten right, Fasten left   AFO - Performed by patient: Don/doff right AFO        Lower body assist Assist for lower body dressing: Supervision or verbal cues      Toileting Toileting   Toileting steps completed by patient: Adjust clothing prior to toileting, Performs  perineal hygiene, Adjust clothing after toileting Toileting steps completed by helper: Performs perineal hygiene Toileting Assistive Devices: Grab bar or rail  Toileting assist Assist level: Touching or steadying assistance (Pt.75%)   Transfers Chair/bed transfer   Chair/bed transfer method: Stand pivot Chair/bed transfer assist level: Touching or steadying assistance (Pt > 75%) Chair/bed transfer assistive device: Medical sales representative     Max distance: >150 Assist level: Touching or steadying assistance (Pt > 75%)   Wheelchair   Type: Manual Max wheelchair distance: 200 Assist Level: Supervision or verbal cues  Cognition Comprehension Comprehension assist level: Follows complex conversation/direction with extra time/assistive device  Expression Expression assist level: Expresses basic 90% of the time/requires cueing < 10% of the time.  Social Interaction Social Interaction assist level: Interacts appropriately 75 - 89% of the time - Needs redirection for appropriate language or to initiate interaction.  Problem Solving Problem solving assist level: Solves basic 90% of the time/requires cueing < 10% of the time  Memory Memory assist level: Recognizes or recalls 75 - 89% of the time/requires cueing 10 - 24% of the time    Medical Problem List and Plan: 1. 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 DVT Prophylaxis/Anticoagulation: Pharmaceutical:Lovenox 3. Pain Management:N/A 4. Mood:LCSW to follow for evaluation and support. 5. Neuropsych: This patientis not fullycapable of making decisions on hisown behalf.   Dc tele-sitter 6.Rash/Skin/Wound Care:Continue Triamcinolone cream tid. Monitor rash for healing. Maintain adequate nutritional and hydration status. 7. Fluids/Electrolytes/Nutrition:Monitor I/O.  8. HTN: Monitor BP bid. On Prinivil, indapamide,   Vitals:   08/12/17 1530 08/13/17  0427  BP: 112/71 127/79  Pulse: 95 84  Resp: 18 16  Temp:  (!) 97 F (36.1 C)  SpO2: 100% 98%    Controlled on 3/18 9. CAD:On ASA and lipitor. 10. H/o Hep C:No meds 11. COPD/Lung CA s/p XRT: On Breo and Ellipta daily. 12. Depression: On Wellbutrin XL. 13. Constipation: Increased MiraLAX to twice a day    Increased bowel reg on 3/5, again on 3/6   Moved bowels on 3/14 .   UA/U culture relatively unremarkable   Improving  14. Itching/rash: Due to recent bed bug infestation. Added Sarna lotion. Improving 15. Prediabetes   Cont to monitor   CBGs   SSI ordered, added qhs SSI   Diet changed to Carb modified   Metformin 250 started on 3/12, increased to 500 on 3/14  Good control as of 08/13/2017 16. Hyponatremia   Na 133 on 3/13   Cont to monitor 17. AKI: Resolved   Cr 0.98 on 3/14   Continue to push fluids   IVF DC'd on 3/7  LOS (Days) 18 A FACE TO FACE EVALUATION WAS PERFORMED  Meredith Staggers 08/13/2017 8:45 AM

## 2017-08-13 NOTE — Progress Notes (Signed)
Occupational Therapy Note  Patient Details  Name: [name] MRN: [id number] Date of Birth: 31-Jul-1955  Today's Date: 08/13/2017 OT Individual Time: 1330-1400 OT Individual Time Calculation (min): 30 min   Pt denies pain Individual Therapy  Pt resting on mat in gym upon arrival.  Pt stated he wanted to walk back to room without his RW but agreeable to wait until his balance and strength improved.  Pt amb with RW back to room and engaged in functional amb with RW for simple home mgmt tasks.  Pt requires steady A when walking and min verbal cues for safety awareness.  Pt doffed AFO and B shoes and returned to bed with all needs within reach and bed alarm activated.    Leotis Shames [facility] 08/13/2017, 2:44 PM

## 2017-08-13 NOTE — Progress Notes (Signed)
Social Work Patient ID: [name], male   DOB: 1956-04-04, 62 y.o.   MRN: [id number]  Have received SNF bed offer from [facility] who can admit pt tomorrow.  Pt and sister aware and accepting bed offer.  Alerting tx team.  Lennart Pall, LCSW

## 2017-08-13 NOTE — Progress Notes (Addendum)
Physical Therapy Session Note  Patient Details  Name: [name] MRN: [id number] Date of Birth: May 03, 1956  Today's Date: 08/13/2017 PT Individual Time: 2811-8867 PT Individual Time Calculation (min): 28 min   Short Term Goals: Week 1:  PT Short Term Goal 1 (Week 1): pt will consistently perform functional transfers with mod A PT Short Term Goal 1 - Progress (Week 1): Met PT Short Term Goal 2 (Week 1): pt will gait in controlled environment x 50' with min A PT Short Term Goal 2 - Progress (Week 1): Met  Skilled Therapeutic Interventions/Progress Updates:   Pt received in bed, agreeable to therapy. Pt transferred EOB and donned shoes w/ R AFO supervision with extended time. Pt ambulated w/ RW steady assist to gym with verbal cuing for R foot clearance. Pt had one LOB and therapist provided min A to correct. Pt demonstrated R foot drag and narrow base of support during gait. Pt performed ball toss on foam with steady assist to improve dynamic standing balance and coordination. Pt performed 1x10 sit <>stand w/o UE support on foam to strengthen BLE and improve balance. Pt reported medium difficulty level with activity. Pt performed a floor<>mat transfer with min A. At end of session, pt handed off to OT for session.        Therapy Documentation Precautions:  Precautions Precautions: Fall Restrictions Weight Bearing Restrictions: No  See Function Navigator for Current Functional Status.   Therapy/Group: Individual Therapy  Caffie Damme 08/13/2017, 3:41 PM

## 2017-08-13 NOTE — Progress Notes (Signed)
Physical Therapy Discharge Summary  Patient Details  Name: [name] MRN: [id number] Date of Birth: 1956-01-31    Patient has met 9 of 9 long term goals due to improved activity tolerance, improved balance, improved postural control, ability to compensate for deficits, functional use of  right upper extremity and right lower extremity and improved coordination.  Patient to discharge at an ambulatory level Supervision.     Reasons goals not met: n/a  Recommendation:  Patient will benefit from ongoing skilled PT services in skilled nursing facility setting to continue to advance safe functional mobility, address ongoing impairments in gait, balance, awareness, and minimize fall risk.  Equipment: No equipment provided  Reasons for discharge: treatment goals met and discharge from hospital  Patient/family agrees with progress made and goals achieved: Yes  PT Discharge Precautions/Restrictions Precautions Precautions: Fall Restrictions Weight Bearing Restrictions: No Pain Pain Assessment Pain Assessment: No/denies pain  Cognition Overall Cognitive Status: History of cognitive impairments - at baseline Arousal/Alertness: Awake/alert Awareness: Impaired Awareness Impairment: Anticipatory impairment Reasoning: Impaired Behaviors: Impulsive Safety/Judgment: Impaired Sensation Sensation Light Touch: (P) Impaired Detail Light Touch Impaired Details: Impaired RUE;Impaired RLE Proprioception: (P) Impaired Detail Proprioception Impaired Details: Impaired RUE;Impaired RLE Coordination Gross Motor Movements are Fluid and Coordinated: No Fine Motor Movements are Fluid and Coordinated: No Coordination and Movement Description: ataxia Rt LE and UE Motor  Motor Motor: (P) Abnormal postural alignment and control;Ataxia Motor - Discharge Observations: Rt UE and LE ataxia   Trunk/Postural Assessment  Cervical Assessment Cervical Assessment: Within Functional Limits Thoracic  Assessment Thoracic Assessment: Within Functional Limits Lumbar Assessment Lumbar Assessment: (posterior pelvic tilt) Postural Control Postural Control: (P) Deficits on evaluation(miproved since eval but still decreased awareness and contro) Righting Reactions: delayed  Balance Dynamic Sitting Balance Sitting balance - Comments: supervision Dynamic Standing Balance Dynamic Standing - Comments: supervision with RW Extremity Assessment      RLE Assessment RLE Assessment: (ankle 3-/5, hip and knee 3-/5) LLE Assessment LLE Assessment: Within Functional Limits   See Function Navigator for Current Functional Status.  Susan Bleich 08/13/2017, 2:22 PM

## 2017-08-13 NOTE — Progress Notes (Signed)
Physical Therapy Note  Patient Details  Name: [name] MRN: [id number] Date of Birth: 21-Mar-1956 Today's Date: 08/13/2017    Time: 1100-1145 45 minutes  1:1 No c/o pain.  Pt requires mod A to don shoe with AFO, supervision for Lt shoe.  Pt performs gait initially with min A, fading to supervision with RW and Rt AFO.  Pt requires verbal cues for attention to Rt foot to reduce toe drag and improve coordination.  Stair negotiation 2 x 4 steps with bilat handrails with supervision for safety.  Standing and seated therex with 1.5# wt bilat LAQ with 5 sec hold, standing hip abduction, HS curl, mini squats.  AAROM Rt ankle with pt improving active DF and PF, still states he does not have good feeling in Rt foot.  Pt states fatigue and requests to rest in bed, pt supervision for bed mobility.  Pt left with needs at hand, bed alarm on.   Janne Faulk 08/13/2017, 11:45 AM

## 2017-08-13 NOTE — Progress Notes (Signed)
Physical Therapy Session Note  Patient Details  Name: [name] MRN: [id number] Date of Birth: [birth year]  Today's Date: 08/13/2017 PT Individual Time: 1400-1445 PT Individual Time Calculation (min): 45 min   Short Term Goals: Week 3:  PT Short Term Goal 1 (Week 3): = LTG  Skilled Therapeutic Interventions/Progress Updates:    Pt performed bed mobility during session modified independent and donned shoes with min assist from EOB requiring assist with R shoe due to AFO (able to remove without assist). Functional transfers with RW during session with close supervision and verbal cues for safety awareness and cues for RLE clearance during mobility. Simulated car transfer with supervision as described above. Gait up/down ramp and over mulched surface for community mobility re-training with steadying assist and cues for safety and attention of RLE and walker during transitions from surfaces. D/c planning discussed as pt reports he is d/c tomorrow. Utilized computer to look up facility and discussed and educated on ways to continue to progress towards goals with therapy and talking about difference between d/c venue and this venue, pt's mood improved (more options for outdoor time, visitors, other resources and programs, etc). NMR during gait without AD to challenge balance and address postural control and ataxia during mobility including during turns and progressing through obstacle course - cues to slow movement during turns due to decreased control of RLE but demonstrates improvement with practice and overall steadying to min assist. Pt able to gait back to room in this manner with steadying assist and repositioned in bed with all needs in reach.    Therapy Documentation Precautions:  Precautions Precautions: Fall Restrictions Weight Bearing Restrictions: No Pain: Pain Assessment Pain Assessment: No/denies pain   See Function Navigator for Current Functional Status.   Therapy/Group:  Individual Therapy  [name], PT, DPT  08/13/2017, 3:04 PM

## 2017-08-14 ENCOUNTER — Inpatient Hospital Stay (HOSPITAL_COMMUNITY): Payer: Medicare HMO | Admitting: Occupational Therapy

## 2017-08-14 ENCOUNTER — Inpatient Hospital Stay (HOSPITAL_COMMUNITY): Payer: Medicare HMO | Admitting: Physical Therapy

## 2017-08-14 DIAGNOSIS — I6389 Other cerebral infarction: Secondary | ICD-10-CM | POA: Diagnosis not present

## 2017-08-14 DIAGNOSIS — J449 Chronic obstructive pulmonary disease, unspecified: Secondary | ICD-10-CM | POA: Diagnosis not present

## 2017-08-14 DIAGNOSIS — H9191 Unspecified hearing loss, right ear: Secondary | ICD-10-CM | POA: Diagnosis not present

## 2017-08-14 DIAGNOSIS — I10 Essential (primary) hypertension: Secondary | ICD-10-CM | POA: Diagnosis not present

## 2017-08-14 DIAGNOSIS — M6281 Muscle weakness (generalized): Secondary | ICD-10-CM | POA: Diagnosis not present

## 2017-08-14 DIAGNOSIS — G464 Cerebellar stroke syndrome: Secondary | ICD-10-CM | POA: Diagnosis not present

## 2017-08-14 DIAGNOSIS — G9009 Other idiopathic peripheral autonomic neuropathy: Secondary | ICD-10-CM | POA: Diagnosis not present

## 2017-08-14 DIAGNOSIS — E119 Type 2 diabetes mellitus without complications: Secondary | ICD-10-CM | POA: Diagnosis not present

## 2017-08-14 DIAGNOSIS — B1710 Acute hepatitis C without hepatic coma: Secondary | ICD-10-CM | POA: Diagnosis not present

## 2017-08-14 DIAGNOSIS — K219 Gastro-esophageal reflux disease without esophagitis: Secondary | ICD-10-CM | POA: Diagnosis not present

## 2017-08-14 DIAGNOSIS — I69351 Hemiplegia and hemiparesis following cerebral infarction affecting right dominant side: Secondary | ICD-10-CM | POA: Diagnosis not present

## 2017-08-14 DIAGNOSIS — R278 Other lack of coordination: Secondary | ICD-10-CM | POA: Diagnosis not present

## 2017-08-14 DIAGNOSIS — G8111 Spastic hemiplegia affecting right dominant side: Secondary | ICD-10-CM | POA: Diagnosis not present

## 2017-08-14 DIAGNOSIS — Z9181 History of falling: Secondary | ICD-10-CM | POA: Diagnosis not present

## 2017-08-14 DIAGNOSIS — G8190 Hemiplegia, unspecified affecting unspecified side: Secondary | ICD-10-CM | POA: Diagnosis not present

## 2017-08-14 DIAGNOSIS — I639 Cerebral infarction, unspecified: Secondary | ICD-10-CM | POA: Diagnosis not present

## 2017-08-14 DIAGNOSIS — I63523 Cerebral infarction due to unspecified occlusion or stenosis of bilateral anterior cerebral arteries: Secondary | ICD-10-CM | POA: Diagnosis not present

## 2017-08-14 DIAGNOSIS — F339 Major depressive disorder, recurrent, unspecified: Secondary | ICD-10-CM | POA: Diagnosis not present

## 2017-08-14 DIAGNOSIS — I2510 Atherosclerotic heart disease of native coronary artery without angina pectoris: Secondary | ICD-10-CM | POA: Diagnosis not present

## 2017-08-14 LAB — GLUCOSE, CAPILLARY
GLUCOSE-CAPILLARY: 92 mg/dL (ref 65–99)
GLUCOSE-CAPILLARY: 103 mg/dL — AB (ref 65–99)
Glucose-Capillary: 94 mg/dL (ref 65–99)

## 2017-08-14 NOTE — Progress Notes (Signed)
Occupational Therapy Discharge Summary  Patient Details  Name: [name] MRN: [id number] Date of Birth: [birth year]  Patient has met 40 of 11 long term goals due to improved activity tolerance, improved balance, postural control, ability to compensate for deficits, functional use of  RIGHT upper and RIGHT lower extremity, improved awareness and improved coordination.  Patient to discharge at overall Supervision level.  Patient's care partner unavailable to provide the necessary physical and cognitive assistance at discharge.    Reasons goals not met: N/A  Recommendation:  Patient will benefit from ongoing skilled OT services in skilled nursing facility setting to continue to advance functional skills in the area of BADL and Reduce care partner burden.  Equipment: No equipment provided  Reasons for discharge: treatment goals met and discharge from hospital  Patient/family agrees with progress made and goals achieved: Yes  OT Discharge Precautions/Restrictions  Precautions Precautions: Fall General   Vital Signs Therapy Vitals Pulse Rate: 84 Resp: 18 BP: 124/75 Patient Position (if appropriate): Lying Oxygen Therapy SpO2: 99 % O2 Device: Room Air Pain Pain Assessment Pain Assessment: No/denies pain ADL  See Function Navigator Vision Baseline Vision/History: Wears glasses Wears Glasses: Reading only;Distance only Patient Visual Report: Blurring of vision Vision Assessment?: Yes Eye Alignment: Within Functional Limits Ocular Range of Motion: Within Functional Limits Alignment/Gaze Preference: Within Defined Limits Tracking/Visual Pursuits: Decreased smoothness of vertical tracking Saccades: Within functional limits Visual Fields: No apparent deficits Cognition Overall Cognitive Status: History of cognitive impairments - at baseline Arousal/Alertness: Awake/alert Orientation Level: Oriented X4 Awareness: Impaired Awareness Impairment: Anticipatory  impairment Reasoning: Impaired Behaviors: Impulsive Safety/Judgment: Impaired Sensation Sensation Light Touch: Impaired Detail Light Touch Impaired Details: Impaired RUE;Impaired RLE Proprioception: Impaired Detail Proprioception Impaired Details: Impaired RUE;Impaired RLE Coordination Gross Motor Movements are Fluid and Coordinated: No Fine Motor Movements are Fluid and Coordinated: No Coordination and Movement Description: ataxia Rt LE and UE Finger Nose Finger Test: mild ataxia/dysmetria Rt UE 9 Hole Peg Test: Lt: 50.9 seconds Rt: 1:38. 4 on 3/1.  1:25 on Rt on 3/14 Extremity/Trunk Assessment RUE Assessment RUE Assessment: Exceptions to [facility] WFL, Strength grossly 4/5) LUE Assessment LUE Assessment: Within Functional Limits   See Function Navigator for Current Functional Status.  [name] 08/14/2017, 9:56 AM

## 2017-08-14 NOTE — Progress Notes (Signed)
Recreational Therapy Discharge Summary Patient Details  Name: [name] MRN: [id number] Date of Birth: 04/15/1956 Today's Date: 08/14/2017  Comments on progress toward goals: Pt made good progress during LOS.  TR sessions focused on activity tolerance, balance, coordination and safety awareness during LOS.  Pt is discharging to SNF for continued therapy and 24 hour supervision.  Reasons for discharge: discharge from hospital Patient/family agrees with progress made and goals achieved: Yes  [name] 08/14/2017, 3:36 PM

## 2017-08-14 NOTE — Progress Notes (Signed)
Occupational Therapy Session Note  Patient Details  Name: [name] MRN: [id number] Date of Birth: 02-Jul-1955  Today's Date: 08/14/2017 OT Individual Time: 7619-5093 OT Individual Time Calculation (min): 43 min    Short Term Goals: Week 3:  OT Short Term Goal 1 (Week 3): STG = LTGs due to remaining LOS  Skilled Therapeutic Interventions/Progress Updates:    Completed ADL retraining at overall Supervision level.  Pt ambulated to room shower with RW with supervision and min cues to attend to Rt foot placement.  Bathing completed at sit > stand level with supervision, demonstrating improved dynamic standing balance and RUE coordination.  Dressing completed at overall supervision level with pt able to fasten pants and tie shoes this session.  Completed grooming tasks in standing at sink with supervision for standing balance.  Educated on fine motor coordination activities to continue to address decreased fine motor control.  Pt reports understanding and ready for d/c.  Therapy Documentation Precautions:  Precautions Precautions: Fall Restrictions Weight Bearing Restrictions: No Pain:  Pt with no c/o pain  See Function Navigator for Current Functional Status.   Therapy/Group: Individual Therapy  [name] 08/14/2017, 8:45 AM

## 2017-08-14 NOTE — Plan of Care (Signed)
  RH SAFETY RH STG ADHERE TO SAFETY PRECAUTIONS W/ASSISTANCE/DEVICE Description STG Adhere to Safety Precautions With min Assistance/Device.  08/14/2017 1120 - Progressing by Blinda Leatherwood, RN  No telesitter, bed alarm continues usage and call light at hand

## 2017-08-14 NOTE — Progress Notes (Signed)
[facility] PHYSICAL [facility]ICINE & REHABILITATION     PROGRESS NOTE  Subjective/Complaints:  No new complaints. Asked about the place he was going to today  ROS: Patient denies fever, rash, sore throat, blurred vision, nausea, vomiting, diarrhea, cough, shortness of breath or chest pain, joint or back pain, headache, or mood change.   Objective: Vital Signs: Blood pressure 116/71, pulse 99, temperature 97.6 F (36.4 C), temperature source Oral, resp. rate 17, height 5\' 7"  (1.702 m), weight 61.2 kg (134 lb 14.7 oz), SpO2 98 %. No results found. No results for input(s): WBC, HGB, HCT, PLT in the last 72 hours. No results for input(s): NA, K, CL, GLUCOSE, BUN, CREATININE, CALCIUM in the last 72 hours.  Invalid input(s): CO CBG (last 3)  Recent Labs    08/13/17 1708 08/13/17 2117 08/14/17 0650  GLUCAP 101* 160* 92    Wt Readings from Last 3 Encounters:  08/08/17 61.2 kg (134 lb 14.7 oz)  07/22/17 66.8 kg (147 lb 4.3 oz)  06/28/17 68.9 kg (151 lb 12.8 oz)    Physical Exam:  BP 116/71 (BP Location: Left Arm)   Pulse 99   Temp 97.6 F (36.4 C) (Oral)   Resp 17   Ht 5\' 7"  (1.702 m)   Wt 61.2 kg (134 lb 14.7 oz)   SpO2 98%   BMI 21.13 kg/m  Constitutional: No distress . Vital signs reviewed. HEENT: EOMI, oral membranes moist Cardiovascular: RRR without murmur. No JVD     Respiratory: CTA Bilaterally without wheezes or rales. Normal effort    GI: BS +, non-tender, non-distended  Musculoskeletal: He exhibits noedema, no tenderness.  Neurological: He isalertand oriented.  Motor: LUE/LLE: 4+/5 proximal to distal 4+/5 right delt bi tri grip 4/5 right HF 4/5 KE, 4+/5 R ankle DF (stable exam)  Skin: Skin iswarmand dry. He isnot diaphoretic.  Psychiatric: pleasant and cooperative  Assessment/Plan: 1. Functional deficits secondary to left Basal ganglia and pontine infarcts which require 3+ hours per day of interdisciplinary therapy in a comprehensive inpatient rehab  setting. Physiatrist is providing close team supervision and 24 hour management of active medical problems listed below. Physiatrist and rehab team continue to assess barriers to discharge/monitor patient progress toward functional and medical goals.  Function:  Bathing Bathing position   Position: Shower  Bathing parts Body parts bathed by patient: Right arm, Chest, Abdomen, Front perineal area, Right upper leg, Left upper leg, Left arm, Buttocks, Right lower leg, Left lower leg Body parts bathed by helper: Back  Bathing assist Assist Level: Supervision or verbal cues      Upper Body Dressing/Undressing Upper body dressing   What is the patient wearing?: Pull over shirt/dress     Pull over shirt/dress - Perfomed by patient: Thread/unthread right sleeve, Thread/unthread left sleeve, Put head through opening, Pull shirt over trunk Pull over shirt/dress - Perfomed by helper: Thread/unthread right sleeve        Upper body assist Assist Level: More than reasonable time      Lower Body Dressing/Undressing Lower body dressing   What is the patient wearing?: Underwear, Pants, Socks, Shoes, AFO Underwear - Performed by patient: Thread/unthread right underwear leg, Thread/unthread left underwear leg, Pull underwear up/down   Pants- Performed by patient: Thread/unthread right pants leg, Thread/unthread left pants leg, Pull pants up/down, Fasten/unfasten pants Pants- Performed by helper: Thread/unthread right pants leg Non-skid slipper socks- Performed by patient: Don/doff left sock, Don/doff right sock Non-skid slipper socks- Performed by helper: Don/doff right sock, Don/doff left  sock Socks - Performed by patient: Don/doff right sock, Don/doff left sock   Shoes - Performed by patient: Don/doff right shoe, Don/doff left shoe, Fasten right, Fasten left   AFO - Performed by patient: Don/doff right AFO        Lower body assist Assist for lower body dressing: Supervision or verbal  cues      Toileting Toileting   Toileting steps completed by patient: Adjust clothing prior to toileting, Performs perineal hygiene, Adjust clothing after toileting Toileting steps completed by helper: Performs perineal hygiene Toileting Assistive Devices: Grab bar or rail  Toileting assist Assist level: Supervision or verbal cues   Transfers Chair/bed transfer   Chair/bed transfer method: Ambulatory Chair/bed transfer assist level: Supervision or verbal cues Chair/bed transfer assistive device: Medical sales representative     Max distance: 100 Assist level: Supervision or verbal cues   Wheelchair   Type: Manual Max wheelchair distance: 200 Assist Level: Supervision or verbal cues  Cognition Comprehension Comprehension assist level: Follows complex conversation/direction with extra time/assistive device  Expression Expression assist level: Expresses basic 90% of the time/requires cueing < 10% of the time.  Social Interaction Social Interaction assist level: Interacts appropriately 75 - 89% of the time - Needs redirection for appropriate language or to initiate interaction.  Problem Solving Problem solving assist level: Solves basic 90% of the time/requires cueing < 10% of the time  Memory Memory assist level: Recognizes or recalls 75 - 89% of the time/requires cueing 10 - 24% of the time    Medical Problem List and Plan: 1. Right hemiparesis and ataxia secondary to left Basal ganglia and pontine infarcts   -discharge to SNF today   -follow up with Dr. Posey Pronto in rehab clinic in 4-6 weeks.  2. DVT Prophylaxis/Anticoagulation: Pharmaceutical:Lovenox 3. Pain Management:N/A 4. Mood:LCSW to follow for evaluation and support. 5. Neuropsych: This patientis not fullycapable of making decisions on hisown behalf.    6.Rash/Skin/Wound Care:Continue Triamcinolone cream tid. Monitor rash for healing. Maintain adequate nutritional and hydration status. 7.  Fluids/Electrolytes/Nutrition:Monitor I/O.  8. HTN: Monitor BP bid. On Prinivil, indapamide,   Vitals:   08/13/17 1352 08/14/17 0300  BP: 104/61 116/71  Pulse: (!) 101 99  Resp: 18 17  Temp: 98.7 F (37.1 C) 97.6 F (36.4 C)  SpO2: 98% 98%    Controlled on 3/19 9. CAD:On ASA and lipitor. 10. H/o Hep C:No meds 11. COPD/Lung CA s/p XRT: On Breo and Ellipta daily. 12. Depression: On Wellbutrin XL. 13. Constipation: Increased MiraLAX to twice a day    Increased bowel reg on 3/5, again on 3/6   Moved bowels on 3/14 .   UA/U culture relatively unremarkable   Improving  14. Itching/rash: Due to recent bed bug infestation. Added Sarna lotion. Improving 15. Prediabetes   Cont to monitor   CBGs   SSI ordered, added qhs SSI   Diet changed to Carb modified   Metformin 250 started on 3/12, increased to 500 on 3/14  Good control as of 08/14/2017 16. Hyponatremia   Na 133 on 3/13   Cont to monitor 17. AKI: Resolved   Cr 0.98 on 3/14   Continue to push fluids   IVF DC'd on 3/7  LOS (Days) 19 A FACE TO FACE EVALUATION WAS PERFORMED  Meredith Staggers 08/14/2017 9:01 AM

## 2017-08-14 NOTE — Progress Notes (Signed)
Pt A/O, no noted distress. Denies pain. Educated on pt transfer and facility.Report given to Trudi Ida [facility] [phone]

## 2017-08-14 NOTE — Plan of Care (Signed)
  RH BOWEL ELIMINATION RH STG MANAGE BOWEL WITH ASSISTANCE Description STG Manage Bowel with mod I Assistance.   08/14/2017 1544 - Adequate for Discharge by Blinda Leatherwood, RN 08/14/2017 1120 - Progressing by Blinda Leatherwood, RN

## 2017-08-15 NOTE — Progress Notes (Signed)
Social Work  Discharge Note  The overall goal for the admission was met for:   Discharge location: No - plan changed to SNF as sister could not provide 24/7 assistance  Length of Stay: Yes - 19 days  Discharge activity level: Yes - supervision to min assist overall  Home/community participation: No  Services provided included: MD, RD, PT, OT, SLP, RN, TR, Pharmacy, [place]: Medicare and Medicaid  Follow-up services arranged: Other: SNF at [facility] (or additional information):  Patient/Family verbalized understanding of follow-up arrangements: Yes  Individual responsible for coordination of the follow-up plan: pt  Confirmed correct DME delivered: NA    [name]

## 2017-08-31 DIAGNOSIS — Z9181 History of falling: Secondary | ICD-10-CM | POA: Diagnosis not present

## 2017-08-31 DIAGNOSIS — J449 Chronic obstructive pulmonary disease, unspecified: Secondary | ICD-10-CM | POA: Diagnosis not present

## 2017-08-31 DIAGNOSIS — I63523 Cerebral infarction due to unspecified occlusion or stenosis of bilateral anterior cerebral arteries: Secondary | ICD-10-CM | POA: Diagnosis not present

## 2017-08-31 DIAGNOSIS — E119 Type 2 diabetes mellitus without complications: Secondary | ICD-10-CM | POA: Diagnosis not present

## 2017-08-31 DIAGNOSIS — I69351 Hemiplegia and hemiparesis following cerebral infarction affecting right dominant side: Secondary | ICD-10-CM | POA: Diagnosis not present

## 2017-08-31 DIAGNOSIS — M6281 Muscle weakness (generalized): Secondary | ICD-10-CM | POA: Diagnosis not present

## 2017-09-17 ENCOUNTER — Telehealth: Payer: Self-pay | Admitting: Neurology

## 2017-09-17 ENCOUNTER — Ambulatory Visit: Payer: Self-pay | Admitting: Adult Health

## 2017-09-17 ENCOUNTER — Encounter: Payer: Self-pay | Admitting: Adult Health
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
## 2017-09-17 NOTE — Telephone Encounter (Signed)
Pt no showed to apt today

## 2017-09-19 ENCOUNTER — Ambulatory Visit: Payer: Medicare HMO | Admitting: Family Medicine

## 2017-09-20 ENCOUNTER — Encounter: Payer: Medicare HMO | Attending: Physical Medicine & Rehabilitation | Admitting: Physical Medicine & Rehabilitation

## 2017-09-26 ENCOUNTER — Encounter: Payer: Self-pay | Admitting: Family Medicine

## 2017-09-26 ENCOUNTER — Other Ambulatory Visit: Payer: Self-pay

## 2017-09-26 ENCOUNTER — Ambulatory Visit (INDEPENDENT_AMBULATORY_CARE_PROVIDER_SITE_OTHER): Payer: Medicare HMO | Admitting: Family Medicine

## 2017-09-26 DIAGNOSIS — I6930 Unspecified sequelae of cerebral infarction: Secondary | ICD-10-CM | POA: Diagnosis not present

## 2017-09-26 DIAGNOSIS — I10 Essential (primary) hypertension: Secondary | ICD-10-CM | POA: Diagnosis not present

## 2017-09-26 DIAGNOSIS — I69398 Other sequelae of cerebral infarction: Secondary | ICD-10-CM

## 2017-09-26 DIAGNOSIS — R269 Unspecified abnormalities of gait and mobility: Secondary | ICD-10-CM

## 2017-09-26 DIAGNOSIS — K219 Gastro-esophageal reflux disease without esophagitis: Secondary | ICD-10-CM | POA: Diagnosis not present

## 2017-09-26 DIAGNOSIS — Z720 Tobacco use: Secondary | ICD-10-CM | POA: Diagnosis not present

## 2017-09-26 DIAGNOSIS — J449 Chronic obstructive pulmonary disease, unspecified: Secondary | ICD-10-CM

## 2017-09-26 MED ORDER — OMEPRAZOLE 40 MG PO CPDR: 40.0000 mg | DELAYED_RELEASE_CAPSULE | Freq: Every day | ORAL | 3 refills | Status: DC

## 2017-09-26 NOTE — Patient Instructions (Addendum)
For sure you need to take aspirin, clopidigrel and atorvastatin to prevent future strokes.    Stop the indapamide.  It is for high blood pressure and your blood pressure is low today.    Use the trelegy every day.  Stop the incruse.  Only use the ventolin when you are short of breath.  Stop the metformen.  It is for diabetes.  You are not diabetic.  You have two heartburn medications and you only need one.  Keep taking the omeprazole.  Stop the protonix.    Stop the bupropion.  It was for your nerves.  Now that your family is back together, I don't think you need it.    Finish what you have of the folic acid but do not refill it.  It is a vitamin.  Stop the gabapentin.  It was for leg pain.  Quit smoking.  Just quit.  Go for another driving test before you decide about tags for your car.   See me in one month.

## 2017-09-27 ENCOUNTER — Encounter: Payer: Self-pay | Admitting: Family Medicine

## 2017-09-27 DIAGNOSIS — I6930 Unspecified sequelae of cerebral infarction: Secondary | ICD-10-CM | POA: Insufficient documentation

## 2017-09-27 NOTE — Assessment & Plan Note (Signed)
Over treated.  Stop meds and recheck.

## 2017-09-27 NOTE — Assessment & Plan Note (Signed)
Simplify meds.

## 2017-09-27 NOTE — Assessment & Plan Note (Signed)
Continue brace.

## 2017-09-27 NOTE — Progress Notes (Signed)
   Subjective:    Patient ID: [name], male    DOB: 1955-11-01, 62 y.o.   MRN: [id number]  HPI FU CVA.  Dysarthria and confusion have resolved.  Still with gait effects.  Wears brace to prevent foot pronation.  Need dual antiplatelet therapy x 3 months. Tobacco abuse.  Still smoking. Hypertension - likely overtreated given todays BP On metformen but no A!C suggest diabetes. Polypharmacy.  He really wants things simplified. Social. Good place.  Now living with sister.    Review of Systems     Objective:   Physical Exam VS noted including low BP Speech is near normal Lungs clear Mentation normal Gait.  Without brace still pronates.  Needs brace.        Assessment & Plan:

## 2017-09-27 NOTE — Assessment & Plan Note (Signed)
On two PPIs: stop one.

## 2017-09-27 NOTE — Assessment & Plan Note (Signed)
Simplified regimen

## 2017-09-27 NOTE — Assessment & Plan Note (Signed)
Vital to stop smoking.

## 2017-10-01 ENCOUNTER — Ambulatory Visit (INDEPENDENT_AMBULATORY_CARE_PROVIDER_SITE_OTHER): Payer: Medicare HMO | Admitting: Adult Health

## 2017-10-01 ENCOUNTER — Encounter: Payer: Self-pay | Admitting: Adult Health

## 2017-10-01 VITALS — BP 133/89 | HR 106 | Ht 67.0 in | Wt 152.0 lb

## 2017-10-01 DIAGNOSIS — E785 Hyperlipidemia, unspecified: Secondary | ICD-10-CM

## 2017-10-01 DIAGNOSIS — Z8673 Personal history of transient ischemic attack (TIA), and cerebral infarction without residual deficits: Secondary | ICD-10-CM | POA: Diagnosis not present

## 2017-10-01 DIAGNOSIS — I10 Essential (primary) hypertension: Secondary | ICD-10-CM | POA: Diagnosis not present

## 2017-10-01 DIAGNOSIS — I63512 Cerebral infarction due to unspecified occlusion or stenosis of left middle cerebral artery: Secondary | ICD-10-CM

## 2017-10-01 MED ORDER — CLOPIDOGREL BISULFATE 75 MG PO TABS: 75.0000 mg | ORAL_TABLET | Freq: Every day | ORAL | 3 refills | Status: DC

## 2017-10-01 MED ORDER — ATORVASTATIN CALCIUM 80 MG PO TABS: 80.0000 mg | ORAL_TABLET | Freq: Every day | ORAL | 3 refills | Status: DC
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
## 2017-10-01 NOTE — Progress Notes (Signed)
I reviewed above note and agree with the assessment and plan.   Do not know why 30 day cardiac event monitoring not done. May have to put in a new order. Thanks.   Rosalin Hawking, MD PhD Stroke Neurology 10/01/2017 6:21 PM

## 2017-10-01 NOTE — Patient Instructions (Signed)
Restart clopidogrel 75 mg daily  and lipitor  for secondary stroke prevention - refill prescriptions sent to [facility]  Stop taking aspirin 81mg   Continue to follow up with PCP regarding cholesterol and blood pressure management  Continue to monitor blood pressure at home  Maintain strict control of hypertension with blood pressure goal below 130/90, diabetes with hemoglobin A1c goal below 6.5% and cholesterol with LDL cholesterol (bad cholesterol) goal below 70 mg/dL. I also advised the patient to eat a healthy diet with plenty of whole grains, cereals, fruits and vegetables, exercise regularly and maintain ideal body weight.  Followup in the future with me in 4 months or call earlier if needed

## 2017-10-02 NOTE — Addendum Note (Signed)
Addended by: Venancio Poisson on: 10/02/2017 08:05 AM   Modules accepted: Orders

## 2017-10-04 ENCOUNTER — Encounter (HOSPITAL_COMMUNITY): Payer: Self-pay | Admitting: Cardiovascular Disease

## 2017-10-11 ENCOUNTER — Encounter (HOSPITAL_COMMUNITY): Payer: Self-pay | Admitting: Emergency Medicine

## 2017-10-11 ENCOUNTER — Ambulatory Visit (HOSPITAL_COMMUNITY)
Admission: EM | Admit: 2017-10-11 | Discharge: 2017-10-11 | Disposition: A | Discharge status: Home / Self Care | Payer: Medicare HMO | Attending: Family Medicine | Admitting: Family Medicine

## 2017-10-11 ENCOUNTER — Other Ambulatory Visit: Payer: Self-pay

## 2017-10-11 DIAGNOSIS — S3992XA Unspecified injury of lower back, initial encounter: Secondary | ICD-10-CM | POA: Diagnosis not present

## 2017-10-11 DIAGNOSIS — W19XXXA Unspecified fall, initial encounter: Secondary | ICD-10-CM

## 2017-10-11 DIAGNOSIS — R55 Syncope and collapse: Secondary | ICD-10-CM

## 2017-10-11 DIAGNOSIS — R4020 Unspecified coma: Secondary | ICD-10-CM
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
## 2017-10-11 NOTE — ED Triage Notes (Signed)
Pt states that he fell twice today and is complaining of pain to his coccyx.  The first fall was from missing a chair and falling on concrete and the second fall was from his dogs knocking him backwards.
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
## 2017-10-17 ENCOUNTER — Encounter: Payer: Self-pay | Admitting: Family Medicine

## 2017-10-17 ENCOUNTER — Ambulatory Visit (INDEPENDENT_AMBULATORY_CARE_PROVIDER_SITE_OTHER): Payer: Medicare HMO | Admitting: Family Medicine

## 2017-10-17 ENCOUNTER — Other Ambulatory Visit: Payer: Self-pay

## 2017-10-17 DIAGNOSIS — M533 Sacrococcygeal disorders, not elsewhere classified: Secondary | ICD-10-CM

## 2017-10-17 DIAGNOSIS — Z720 Tobacco use: Secondary | ICD-10-CM | POA: Diagnosis not present

## 2017-10-17 DIAGNOSIS — I10 Essential (primary) hypertension: Secondary | ICD-10-CM | POA: Diagnosis not present

## 2017-10-17 DIAGNOSIS — I6930 Unspecified sequelae of cerebral infarction: Secondary | ICD-10-CM | POA: Diagnosis not present
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
## 2017-10-17 NOTE — Assessment & Plan Note (Signed)
Still in acute healing phase.  Hopefully will not be chronic.

## 2017-10-17 NOTE — Patient Instructions (Signed)
I think we now have your meds straight. See me in three months. Tell [name] I said Hi. Those cigarettes are gonna kill you.

## 2017-10-17 NOTE — Assessment & Plan Note (Signed)
Stable off medications.  

## 2017-10-17 NOTE — Assessment & Plan Note (Signed)
He will likely never quit.  I will continue to suggest each visit

## 2017-10-31 ENCOUNTER — Inpatient Hospital Stay (HOSPITAL_COMMUNITY): Admission: RE | Admit: 2017-10-31 | Payer: Medicare HMO | Source: Ambulatory Visit

## 2017-11-07 ENCOUNTER — Telehealth: Payer: Self-pay | Admitting: Family Medicine

## 2017-11-07 ENCOUNTER — Ambulatory Visit (HOSPITAL_COMMUNITY)
Admission: RE | Admit: 2017-11-07 | Discharge: 2017-11-07 | Disposition: A | Discharge status: Home / Self Care | Payer: Medicare HMO | Source: Ambulatory Visit | Attending: Cardiology | Admitting: Cardiology

## 2017-11-07 ENCOUNTER — Ambulatory Visit: Payer: Medicare HMO | Admitting: Family Medicine

## 2017-11-07 DIAGNOSIS — I70202 Unspecified atherosclerosis of native arteries of extremities, left leg: Secondary | ICD-10-CM | POA: Insufficient documentation

## 2017-11-07 DIAGNOSIS — Z09 Encounter for follow-up examination after completed treatment for conditions other than malignant neoplasm: Secondary | ICD-10-CM | POA: Insufficient documentation

## 2017-11-07 DIAGNOSIS — I739 Peripheral vascular disease, unspecified: Secondary | ICD-10-CM
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
## 2017-11-07 NOTE — Telephone Encounter (Signed)
DMV form dropped off for at front desk for completion.  Verified that patient section of form has been completed.  Last DOS/WCC with PCP was 10/17/17.  Placed form in team folder to be completed by clinical staff.  Crista Luria

## 2017-11-07 NOTE — Telephone Encounter (Signed)
Clinical info completed on DMV form.  Place form in Dr. Lowella Bandy box for completion.  Ottis Stain, CMA

## 2017-11-08 ENCOUNTER — Other Ambulatory Visit: Payer: Self-pay | Admitting: *Deleted

## 2017-11-08 DIAGNOSIS — I739 Peripheral vascular disease, unspecified: Secondary | ICD-10-CM

## 2017-11-08 NOTE — Telephone Encounter (Signed)
Contacted pt and gave him the below information and he stated that he would call back to schedule and appointment for this once he spoke to someone about their schedule. Katharina Caper, April D, [place]

## 2017-11-14 ENCOUNTER — Encounter: Payer: Self-pay | Admitting: Family Medicine

## 2017-11-14 ENCOUNTER — Ambulatory Visit (INDEPENDENT_AMBULATORY_CARE_PROVIDER_SITE_OTHER): Payer: Medicare HMO | Admitting: Family Medicine

## 2017-11-14 ENCOUNTER — Other Ambulatory Visit: Payer: Self-pay

## 2017-11-14 DIAGNOSIS — J449 Chronic obstructive pulmonary disease, unspecified: Secondary | ICD-10-CM | POA: Diagnosis not present

## 2017-11-14 DIAGNOSIS — Z599 Problem related to housing and economic circumstances, unspecified: Secondary | ICD-10-CM

## 2017-11-14 DIAGNOSIS — Z59819 Housing instability, housed unspecified: Secondary | ICD-10-CM

## 2017-11-14 DIAGNOSIS — K219 Gastro-esophageal reflux disease without esophagitis: Secondary | ICD-10-CM | POA: Diagnosis not present

## 2017-11-14 MED ORDER — FLUTICASONE-UMECLIDIN-VILANT 100-62.5-25 MCG/INH IN AEPB: 1.0000 | INHALATION_SPRAY | Freq: Every day | RESPIRATORY_TRACT | 6 refills | Status: DC

## 2017-11-14 MED ORDER — OMEPRAZOLE 40 MG PO CPDR: 40.0000 mg | DELAYED_RELEASE_CAPSULE | Freq: Every day | ORAL | 3 refills | Status: DC

## 2017-11-14 NOTE — Patient Instructions (Signed)
I sent in refills of those two medications. I am glad you are just going to go with a [place] ID rather than a drivers license.   Sorry that [name] is moving.   Hang in there.

## 2017-11-15 ENCOUNTER — Encounter: Payer: Self-pay | Admitting: Family Medicine

## 2017-11-15 DIAGNOSIS — Z59819 Housing instability, housed unspecified: Secondary | ICD-10-CM | POA: Insufficient documentation

## 2017-11-15 DIAGNOSIS — Z599 Problem related to housing and economic circumstances, unspecified: Secondary | ICD-10-CM | POA: Insufficient documentation

## 2017-11-15 NOTE — Assessment & Plan Note (Signed)
Refill trelegy

## 2017-11-15 NOTE — Assessment & Plan Note (Signed)
Connected with SW who gave him contact numbers.  He still needs to do the leg work.  He is aware and has the SW number in case he needs help.

## 2017-11-15 NOTE — Assessment & Plan Note (Signed)
Refill omeprazole
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
## 2017-11-22 DIAGNOSIS — H2513 Age-related nuclear cataract, bilateral: Secondary | ICD-10-CM | POA: Diagnosis not present

## 2017-11-22 DIAGNOSIS — H25043 Posterior subcapsular polar age-related cataract, bilateral: Secondary | ICD-10-CM | POA: Diagnosis not present

## 2017-11-26 ENCOUNTER — Telehealth: Payer: Self-pay | Admitting: Family Medicine

## 2017-11-26 NOTE — Telephone Encounter (Signed)
Handwritten Rx placed up front for patient to pick up.

## 2017-11-26 NOTE — Telephone Encounter (Signed)
Discussed.  Would like a rolling walker.  He is tripping over his one with rear tennis balls.

## 2017-11-26 NOTE — Telephone Encounter (Signed)
Pt called and would like Dr Andria Frames to give him a call to discuss possibly getting a new walker. He is having issues with the tennis balls on the back.

## 2017-12-05 DIAGNOSIS — H2511 Age-related nuclear cataract, right eye: Secondary | ICD-10-CM | POA: Diagnosis not present

## 2017-12-05 DIAGNOSIS — H2512 Age-related nuclear cataract, left eye: Secondary | ICD-10-CM | POA: Diagnosis not present

## 2017-12-05 DIAGNOSIS — H25041 Posterior subcapsular polar age-related cataract, right eye: Secondary | ICD-10-CM | POA: Diagnosis not present

## 2017-12-05 DIAGNOSIS — H25042 Posterior subcapsular polar age-related cataract, left eye: Secondary | ICD-10-CM | POA: Diagnosis not present

## 2017-12-12 DIAGNOSIS — H25042 Posterior subcapsular polar age-related cataract, left eye: Secondary | ICD-10-CM | POA: Diagnosis not present

## 2017-12-12 DIAGNOSIS — H2512 Age-related nuclear cataract, left eye: Secondary | ICD-10-CM | POA: Diagnosis not present

## 2017-12-19 ENCOUNTER — Encounter: Payer: Self-pay | Admitting: Family Medicine

## 2017-12-19 ENCOUNTER — Telehealth: Payer: Self-pay | Admitting: *Deleted

## 2017-12-19 ENCOUNTER — Other Ambulatory Visit: Payer: Self-pay

## 2017-12-19 ENCOUNTER — Ambulatory Visit (INDEPENDENT_AMBULATORY_CARE_PROVIDER_SITE_OTHER): Payer: Medicare HMO | Admitting: Family Medicine

## 2017-12-19 DIAGNOSIS — I69351 Hemiplegia and hemiparesis following cerebral infarction affecting right dominant side: Secondary | ICD-10-CM

## 2017-12-19 DIAGNOSIS — J449 Chronic obstructive pulmonary disease, unspecified: Secondary | ICD-10-CM

## 2017-12-19 DIAGNOSIS — Z720 Tobacco use: Secondary | ICD-10-CM

## 2017-12-19 NOTE — Patient Instructions (Signed)
Check your medication list at home.  You need to be on both atorvastatin and clopidogrel to prevent strokes.  Make sure you are taking both.  If you are out of one, I am happy to refill it. Let me know if I can help you quit smoking.  It is important. I hope this new living situation turns out well for you. See me again in late Sept, early Oct to get a flu shot.

## 2017-12-19 NOTE — Telephone Encounter (Signed)
noted 

## 2017-12-19 NOTE — Telephone Encounter (Signed)
Patient wanted to let Dr. Andria Frames know that he is taking both the atorvastatin and clopidogrel. Tamkia Temples, Salome Spotted, CMA

## 2017-12-20 ENCOUNTER — Encounter: Payer: Self-pay | Admitting: Family Medicine

## 2017-12-20 ENCOUNTER — Telehealth: Payer: Self-pay | Admitting: Adult Health

## 2017-12-20 NOTE — Progress Notes (Signed)
   Subjective:    Patient ID: [name], male    DOB: 20-Feb-1956, 62 y.o.   MRN: [id number]  HPI [name] has a few, minor issues to follow up. The biggest issue is that he continues to smoke 1ppd.  He has attempted to quit multiple times in the past and has been unsuccessful.  Frankly, he has no motivation to quit at this time. COPD stable on current meds.  Verified that he is using his inhalors correctly. SP CVA.  He said in the office that he was only on one medicine to prevent stroke.  I was concerned that he is supposed to be on both plavix and atorvastatin.  He called me from home after visit and said that he was indeed taking both meds. Social: Now in [facility] halfway house.  Sister has moved to [place].    Review of Systems     Objective:   Physical Exam Lungs clear Cardiac rRR without m or g       Assessment & Plan:

## 2017-12-20 NOTE — Assessment & Plan Note (Signed)
Verified compliance with both plavix and atorvastatin

## 2017-12-20 NOTE — Assessment & Plan Note (Signed)
Once again counseled to quit

## 2017-12-20 NOTE — Telephone Encounter (Signed)
Received staff message from Earvin Hansen stating patient refusing 30 day cardiac monitor to rule out AF. Order cancelled.

## 2017-12-20 NOTE — Assessment & Plan Note (Signed)
Stable on current meds.  Verified that he is using both inhalers.

## 2017-12-31 DIAGNOSIS — Z961 Presence of intraocular lens: Secondary | ICD-10-CM | POA: Diagnosis not present

## 2017-12-31 DIAGNOSIS — Z0100 Encounter for examination of eyes and vision without abnormal findings: Secondary | ICD-10-CM | POA: Diagnosis not present

## 2018-01-04 NOTE — Telephone Encounter (Signed)
finished

## 2018-02-04 ENCOUNTER — Ambulatory Visit: Payer: Medicare HMO | Admitting: Adult Health
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
## 2018-02-05 ENCOUNTER — Ambulatory Visit: Payer: Medicare HMO | Admitting: Adult Health

## 2018-02-07 ENCOUNTER — Encounter: Payer: Self-pay | Admitting: Adult Health

## 2018-02-07 ENCOUNTER — Ambulatory Visit (INDEPENDENT_AMBULATORY_CARE_PROVIDER_SITE_OTHER): Payer: Medicare HMO | Admitting: Adult Health

## 2018-02-07 VITALS — BP 133/87 | HR 94 | Ht 67.0 in | Wt 166.7 lb

## 2018-02-07 DIAGNOSIS — I69351 Hemiplegia and hemiparesis following cerebral infarction affecting right dominant side: Secondary | ICD-10-CM | POA: Diagnosis not present

## 2018-02-07 DIAGNOSIS — I10 Essential (primary) hypertension: Secondary | ICD-10-CM | POA: Diagnosis not present

## 2018-02-07 DIAGNOSIS — E785 Hyperlipidemia, unspecified: Secondary | ICD-10-CM | POA: Diagnosis not present

## 2018-02-07 DIAGNOSIS — I63512 Cerebral infarction due to unspecified occlusion or stenosis of left middle cerebral artery: Secondary | ICD-10-CM
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
## 2018-02-07 NOTE — Patient Instructions (Signed)
Continue clopidogrel 75 mg daily  and lipitor 80mg   for secondary stroke prevention  Continue to follow up with PCP regarding cholesterol and blood pressure management    Continue to stay active and maintain a healthy diet  Continue to monitor blood pressure at home  Referral placed for physical therapy   Recommend stopping into office: [place] [phone] [facility])  They will help you set up transportation to and from appointments to different doctor appointments and therapy sessions   Maintain strict control of hypertension with blood pressure goal below 130/90, diabetes with hemoglobin A1c goal below 6.5% and cholesterol with LDL cholesterol (bad cholesterol) goal below 70 mg/dL. I also advised the patient to eat a healthy diet with plenty of whole grains, cereals, fruits and vegetables, exercise regularly and maintain ideal body weight.  Followup in the future with me in 6 months or call earlier if needed       Thank you for coming to see [place] at [facility]. I hope we have been able to provide you high quality care today.  You may receive a patient satisfaction survey over the next few weeks. We would appreciate your feedback and comments so that we may continue to improve ourselves and the health of our patients.

## 2018-02-10 NOTE — Progress Notes (Signed)
I agree with the above plan 

## 2018-02-19 ENCOUNTER — Other Ambulatory Visit: Payer: Self-pay | Admitting: *Deleted

## 2018-02-19 DIAGNOSIS — I739 Peripheral vascular disease, unspecified: Secondary | ICD-10-CM

## 2018-02-20 ENCOUNTER — Ambulatory Visit (INDEPENDENT_AMBULATORY_CARE_PROVIDER_SITE_OTHER): Payer: Medicare HMO | Admitting: Family Medicine

## 2018-02-20 ENCOUNTER — Other Ambulatory Visit: Payer: Self-pay

## 2018-02-20 ENCOUNTER — Encounter: Payer: Self-pay | Admitting: Family Medicine

## 2018-02-20 DIAGNOSIS — Z23 Encounter for immunization: Secondary | ICD-10-CM

## 2018-02-20 DIAGNOSIS — I10 Essential (primary) hypertension: Secondary | ICD-10-CM | POA: Diagnosis not present

## 2018-02-20 DIAGNOSIS — J441 Chronic obstructive pulmonary disease with (acute) exacerbation: Secondary | ICD-10-CM

## 2018-02-20 MED ORDER — PREDNISONE 20 MG PO TABS: 40.0000 mg | ORAL_TABLET | Freq: Every day | ORAL | 0 refills | Status: DC

## 2018-02-20 MED ORDER — AZITHROMYCIN 500 MG PO TABS: 500.0000 mg | ORAL_TABLET | Freq: Every day | ORAL | 0 refills | Status: DC

## 2018-02-20 MED ORDER — HYDROCHLOROTHIAZIDE 12.5 MG PO TABS: 12.5000 mg | ORAL_TABLET | Freq: Every day | ORAL | 3 refills | Status: DC

## 2018-02-20 NOTE — Patient Instructions (Signed)
Please double check your medication list. I added back hydrochlorothiazide for high blood pressure.  You will stay on this medicine long term I sent in two short term prescriptions for your lungs. See me in 6 weeks to make sure your blood pressure has come down.

## 2018-02-21 ENCOUNTER — Encounter: Payer: Self-pay | Admitting: Family Medicine

## 2018-02-21 NOTE — Progress Notes (Signed)
   Subjective:    Patient ID: [name], male    DOB: 1956-01-27, 62 y.o.   MRN: [id number]  HPI Worsened breathing x 1 week  Cough, no fever.  Using inhalers.  Still smokes  HBP.  Denies sx.    HPDP need flu shot.      Review of Systems     Objective:   Physical Exam  BP elevated - confirmed.   Lungs diffuse inspiratory and exp wheeze.   Cardiac rRR without m or g Abd benign.       Assessment & Plan:

## 2018-02-21 NOTE — Assessment & Plan Note (Signed)
Previously on HCTZ.  Stopped with low BPs.  Now rising back up.  Will restart low dose HCTZ

## 2018-02-21 NOTE — Assessment & Plan Note (Signed)
pred and azithro.  Again encourage to stop smoking.

## 2018-03-25 ENCOUNTER — Ambulatory Visit (INDEPENDENT_AMBULATORY_CARE_PROVIDER_SITE_OTHER): Payer: Medicare HMO | Admitting: Family Medicine

## 2018-03-25 ENCOUNTER — Other Ambulatory Visit: Payer: Self-pay

## 2018-03-25 ENCOUNTER — Encounter: Payer: Self-pay | Admitting: Family Medicine

## 2018-03-25 DIAGNOSIS — I6930 Unspecified sequelae of cerebral infarction: Secondary | ICD-10-CM | POA: Diagnosis not present

## 2018-03-25 DIAGNOSIS — Z515 Encounter for palliative care: Secondary | ICD-10-CM | POA: Insufficient documentation

## 2018-03-25 DIAGNOSIS — Z720 Tobacco use: Secondary | ICD-10-CM

## 2018-03-25 DIAGNOSIS — I10 Essential (primary) hypertension: Secondary | ICD-10-CM | POA: Diagnosis not present

## 2018-03-25 NOTE — Patient Instructions (Signed)
See me in March or April - after the cold and flu season.  Of course, see me sooner if you are sick. Stay on the blood pressure medicine. Of course, I would like you to quit smoking.   I am proud that you are still clean and sober.   Let me know if I can do anything.

## 2018-03-25 NOTE — Assessment & Plan Note (Signed)
Does not intend to quit.

## 2018-03-25 NOTE — Assessment & Plan Note (Signed)
See overview.   

## 2018-03-26 ENCOUNTER — Encounter: Payer: Self-pay | Admitting: Family Medicine

## 2018-03-26 NOTE — Progress Notes (Signed)
   Subjective:    Patient ID: [name], male    DOB: [birth year], 62 y.o.   MRN: [id number]  HPI  FU hypertension. Doing well, no complaints.  Tolerating meds well.  Social: Now living in group home with 7 other guys.  Going OK.  Tobacco:  Still smokes.  No intention of quitting.  I will smoke until the day I die.  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  Late effects of CVA.  Continues to walk with cane.  Uses walker if longer distance.  Goals of care:  States he has filled out a living will but he does not have a copy.  Given risk for decompensation at any time, I reviewed goals of care and documented in problem list under encounter for palliative care.Marland Kitchen  He wants no heroic measures.   Review of Systems     Objective:   Physical Exam VS noted Lungs clear Cardiac RRR without m or g  >50% of visit was counseling Duration= 25 min.       Assessment & Plan:

## 2018-03-26 NOTE — Assessment & Plan Note (Signed)
Stable and remains on plavix.  No PT needs at present.

## 2018-03-26 NOTE — Assessment & Plan Note (Signed)
Stable on current meds 

## 2018-07-13 ENCOUNTER — Observation Stay (HOSPITAL_COMMUNITY)
Admission: EM | Admit: 2018-07-13 | Discharge: 2018-07-13 | Discharge status: Against Medical Advice | Payer: Medicare HMO | Attending: Family Medicine | Admitting: Family Medicine

## 2018-07-13 ENCOUNTER — Emergency Department (HOSPITAL_COMMUNITY): Payer: Medicare HMO

## 2018-07-13 ENCOUNTER — Encounter (HOSPITAL_COMMUNITY): Payer: Self-pay | Admitting: Radiology

## 2018-07-13 DIAGNOSIS — Z85118 Personal history of other malignant neoplasm of bronchus and lung: Secondary | ICD-10-CM | POA: Diagnosis not present

## 2018-07-13 DIAGNOSIS — R404 Transient alteration of awareness: Secondary | ICD-10-CM | POA: Diagnosis not present

## 2018-07-13 DIAGNOSIS — I700 Atherosclerosis of aorta: Secondary | ICD-10-CM | POA: Insufficient documentation

## 2018-07-13 DIAGNOSIS — F329 Major depressive disorder, single episode, unspecified: Secondary | ICD-10-CM | POA: Insufficient documentation

## 2018-07-13 DIAGNOSIS — D509 Iron deficiency anemia, unspecified: Secondary | ICD-10-CM | POA: Insufficient documentation

## 2018-07-13 DIAGNOSIS — R4781 Slurred speech: Secondary | ICD-10-CM | POA: Diagnosis not present

## 2018-07-13 DIAGNOSIS — I69398 Other sequelae of cerebral infarction: Secondary | ICD-10-CM

## 2018-07-13 DIAGNOSIS — I2510 Atherosclerotic heart disease of native coronary artery without angina pectoris: Secondary | ICD-10-CM | POA: Diagnosis present

## 2018-07-13 DIAGNOSIS — E785 Hyperlipidemia, unspecified: Secondary | ICD-10-CM | POA: Diagnosis not present

## 2018-07-13 DIAGNOSIS — R269 Unspecified abnormalities of gait and mobility: Secondary | ICD-10-CM | POA: Insufficient documentation

## 2018-07-13 DIAGNOSIS — I651 Occlusion and stenosis of basilar artery: Secondary | ICD-10-CM | POA: Diagnosis not present

## 2018-07-13 DIAGNOSIS — G459 Transient cerebral ischemic attack, unspecified: Secondary | ICD-10-CM | POA: Diagnosis not present

## 2018-07-13 DIAGNOSIS — I6523 Occlusion and stenosis of bilateral carotid arteries: Secondary | ICD-10-CM | POA: Diagnosis not present

## 2018-07-13 DIAGNOSIS — Z79899 Other long term (current) drug therapy: Secondary | ICD-10-CM | POA: Insufficient documentation

## 2018-07-13 DIAGNOSIS — I69351 Hemiplegia and hemiparesis following cerebral infarction affecting right dominant side: Secondary | ICD-10-CM | POA: Diagnosis not present

## 2018-07-13 DIAGNOSIS — I639 Cerebral infarction, unspecified: Secondary | ICD-10-CM

## 2018-07-13 DIAGNOSIS — S199XXA Unspecified injury of neck, initial encounter: Secondary | ICD-10-CM | POA: Diagnosis not present

## 2018-07-13 DIAGNOSIS — B182 Chronic viral hepatitis C: Secondary | ICD-10-CM | POA: Insufficient documentation

## 2018-07-13 DIAGNOSIS — Z5329 Procedure and treatment not carried out because of patient's decision for other reasons: Secondary | ICD-10-CM | POA: Insufficient documentation

## 2018-07-13 DIAGNOSIS — I672 Cerebral atherosclerosis: Secondary | ICD-10-CM | POA: Diagnosis not present

## 2018-07-13 DIAGNOSIS — I6930 Unspecified sequelae of cerebral infarction: Secondary | ICD-10-CM

## 2018-07-13 DIAGNOSIS — R55 Syncope and collapse: Secondary | ICD-10-CM | POA: Diagnosis not present

## 2018-07-13 DIAGNOSIS — J449 Chronic obstructive pulmonary disease, unspecified: Secondary | ICD-10-CM | POA: Diagnosis not present

## 2018-07-13 DIAGNOSIS — F321 Major depressive disorder, single episode, moderate: Secondary | ICD-10-CM | POA: Diagnosis present

## 2018-07-13 DIAGNOSIS — Z59819 Housing instability, housed unspecified: Secondary | ICD-10-CM

## 2018-07-13 DIAGNOSIS — F17210 Nicotine dependence, cigarettes, uncomplicated: Secondary | ICD-10-CM | POA: Diagnosis not present

## 2018-07-13 DIAGNOSIS — I491 Atrial premature depolarization: Secondary | ICD-10-CM | POA: Diagnosis not present

## 2018-07-13 DIAGNOSIS — K219 Gastro-esophageal reflux disease without esophagitis: Secondary | ICD-10-CM | POA: Diagnosis not present

## 2018-07-13 DIAGNOSIS — Z720 Tobacco use: Secondary | ICD-10-CM | POA: Diagnosis present

## 2018-07-13 DIAGNOSIS — I739 Peripheral vascular disease, unspecified: Secondary | ICD-10-CM | POA: Diagnosis not present

## 2018-07-13 DIAGNOSIS — R4182 Altered mental status, unspecified: Secondary | ICD-10-CM

## 2018-07-13 DIAGNOSIS — B1920 Unspecified viral hepatitis C without hepatic coma: Secondary | ICD-10-CM | POA: Insufficient documentation

## 2018-07-13 DIAGNOSIS — F1011 Alcohol abuse, in remission: Secondary | ICD-10-CM | POA: Diagnosis present

## 2018-07-13 DIAGNOSIS — I10 Essential (primary) hypertension: Secondary | ICD-10-CM | POA: Insufficient documentation

## 2018-07-13 DIAGNOSIS — R7303 Prediabetes: Secondary | ICD-10-CM | POA: Diagnosis not present

## 2018-07-13 DIAGNOSIS — I679 Cerebrovascular disease, unspecified: Secondary | ICD-10-CM | POA: Insufficient documentation

## 2018-07-13 DIAGNOSIS — Z515 Encounter for palliative care: Secondary | ICD-10-CM

## 2018-07-13 DIAGNOSIS — Z599 Problem related to housing and economic circumstances, unspecified: Secondary | ICD-10-CM

## 2018-07-13 HISTORY — DX: Chronic obstructive pulmonary disease with (acute) exacerbation: J44.1

## 2018-07-13 HISTORY — DX: Major depressive disorder, single episode, moderate: F32.1

## 2018-07-13 HISTORY — DX: Low back pain: M54.5

## 2018-07-13 HISTORY — DX: Personal history of colonic polyps: Z86.010

## 2018-07-13 HISTORY — DX: Insomnia, unspecified: G47.00

## 2018-07-13 HISTORY — DX: Migraine, unspecified, not intractable, without status migrainosus: G43.909

## 2018-07-13 HISTORY — DX: Unspecified urinary incontinence: R32

## 2018-07-13 HISTORY — DX: Chronic obstructive pulmonary disease, unspecified: J44.9

## 2018-07-13 LAB — CBC
HCT: 44.7 % (ref 39.0–52.0)
Hemoglobin: 14.6 g/dL (ref 13.0–17.0)
MCH: 31.3 pg (ref 26.0–34.0)
MCHC: 32.7 g/dL (ref 30.0–36.0)
MCV: 95.9 fL (ref 80.0–100.0)
Platelets: 189 10*3/uL (ref 150–400)
RBC: 4.66 MIL/uL (ref 4.22–5.81)
RDW: 12.3 % (ref 11.5–15.5)
WBC: 5 10*3/uL (ref 4.0–10.5)
nRBC: 0 % (ref 0.0–0.2)

## 2018-07-13 LAB — URINALYSIS, ROUTINE W REFLEX MICROSCOPIC
BILIRUBIN URINE: NEGATIVE
Glucose, UA: NEGATIVE mg/dL
Hgb urine dipstick: NEGATIVE
KETONES UR: NEGATIVE mg/dL
Leukocytes,Ua: NEGATIVE
Nitrite: NEGATIVE
Protein, ur: NEGATIVE mg/dL
Specific Gravity, Urine: >1.046 — ABNORMAL HIGH (ref 1.005–1.030)
pH: 6 (ref 5.0–8.0)

## 2018-07-13 LAB — COMPREHENSIVE METABOLIC PANEL
ALT: 24 U/L (ref 0–44)
ANION GAP: 8 (ref 5–15)
AST: 18 U/L (ref 15–41)
Albumin: 3.5 g/dL (ref 3.5–5.0)
Alkaline Phosphatase: 72 U/L (ref 38–126)
BUN: 20 mg/dL (ref 8–23)
CO2: 24 mmol/L (ref 22–32)
Calcium: 9 mg/dL (ref 8.9–10.3)
Chloride: 103 mmol/L (ref 98–111)
Creatinine, Ser: 1.19 mg/dL (ref 0.61–1.24)
GFR calc Af Amer: >60 mL/min (ref >60)
GFR calc non Af Amer: >60 mL/min (ref >60)
GLUCOSE: 155 mg/dL — AB (ref 70–99)
Potassium: 3.7 mmol/L (ref 3.5–5.1)
Sodium: 135 mmol/L (ref 135–145)
Total Bilirubin: 0.6 mg/dL (ref 0.3–1.2)
Total Protein: 6.2 g/dL — ABNORMAL LOW (ref 6.5–8.1)

## 2018-07-13 LAB — DIFFERENTIAL
Abs Immature Granulocytes: 0.03 10*3/uL (ref 0.00–0.07)
Basophils Absolute: 0 10*3/uL (ref 0.0–0.1)
Basophils Relative: 1 %
Eosinophils Absolute: 0.1 10*3/uL (ref 0.0–0.5)
Eosinophils Relative: 3 %
Immature Granulocytes: 1 %
LYMPHS ABS: 1.4 10*3/uL (ref 0.7–4.0)
Lymphocytes Relative: 28 %
Monocytes Absolute: 0.6 10*3/uL (ref 0.1–1.0)
Monocytes Relative: 13 %
Neutro Abs: 2.8 10*3/uL (ref 1.7–7.7)
Neutrophils Relative %: 54 %

## 2018-07-13 LAB — PROTIME-INR
INR: 1
Prothrombin Time: 13.1 seconds (ref 11.4–15.2)

## 2018-07-13 LAB — RAPID URINE DRUG SCREEN, HOSP PERFORMED
AMPHETAMINES: NOT DETECTED
Barbiturates: NOT DETECTED
Benzodiazepines: NOT DETECTED
Cocaine: NOT DETECTED
OPIATES: NOT DETECTED
TETRAHYDROCANNABINOL: NOT DETECTED

## 2018-07-13 LAB — I-STAT CREATININE, ED: Creatinine, Ser: 1.1 mg/dL (ref 0.61–1.24)

## 2018-07-13 LAB — CBG MONITORING, ED: Glucose-Capillary: 143 mg/dL — ABNORMAL HIGH (ref 70–99)

## 2018-07-13 LAB — APTT: aPTT: 27 seconds (ref 24–36)

## 2018-07-13 LAB — CK: CK TOTAL: 41 U/L — AB (ref 49–397)

## 2018-07-13 LAB — ETHANOL: Alcohol, Ethyl (B): <10 mg/dL (ref <10)

## 2018-07-13 MED ORDER — LORAZEPAM 2 MG/ML IJ SOLN: 1.0000 mg | Freq: Once | INTRAMUSCULAR | Status: DC

## 2018-07-13 MED ORDER — IOPAMIDOL (ISOVUE-370) INJECTION 76%
50.0000 mL | Freq: Once | INTRAVENOUS | Status: AC | PRN
  Administered 2018-07-13: 50 mL via INTRAVENOUS

## 2018-07-13 MED ORDER — ACETAMINOPHEN 650 MG RE SUPP: 650.0000 mg | RECTAL | Status: DC | PRN

## 2018-07-13 MED ORDER — ASPIRIN 325 MG PO TABS: 325.0000 mg | ORAL_TABLET | Freq: Every day | ORAL | Status: DC

## 2018-07-13 MED ORDER — PANTOPRAZOLE SODIUM 40 MG PO TBEC: 40.0000 mg | DELAYED_RELEASE_TABLET | Freq: Every day | ORAL | Status: DC

## 2018-07-13 MED ORDER — ENOXAPARIN SODIUM 40 MG/0.4ML ~~LOC~~ SOLN: 40.0000 mg | SUBCUTANEOUS | Status: DC

## 2018-07-13 MED ORDER — CLOPIDOGREL BISULFATE 75 MG PO TABS: 75.0000 mg | ORAL_TABLET | Freq: Every day | ORAL | Status: DC

## 2018-07-13 MED ORDER — STROKE: EARLY STAGES OF RECOVERY BOOK
Freq: Once | Status: DC
  Filled 2018-07-13: qty 1

## 2018-07-13 MED ORDER — ACETAMINOPHEN 325 MG PO TABS: 650.0000 mg | ORAL_TABLET | ORAL | Status: DC | PRN

## 2018-07-13 MED ORDER — NICOTINE 21 MG/24HR TD PT24: 21.0000 mg | MEDICATED_PATCH | Freq: Every day | TRANSDERMAL | Status: DC

## 2018-07-13 MED ORDER — SENNOSIDES-DOCUSATE SODIUM 8.6-50 MG PO TABS: 1.0000 | ORAL_TABLET | Freq: Every evening | ORAL | Status: DC | PRN

## 2018-07-13 MED ORDER — FLUTICASONE-UMECLIDIN-VILANT 100-62.5-25 MCG/INH IN AEPB: 1.0000 | INHALATION_SPRAY | Freq: Every day | RESPIRATORY_TRACT | Status: DC

## 2018-07-13 MED ORDER — SODIUM CHLORIDE 0.9% FLUSH: 3.0000 mL | Freq: Once | INTRAVENOUS | Status: DC

## 2018-07-13 MED ORDER — ASPIRIN 300 MG RE SUPP: 300.0000 mg | Freq: Every day | RECTAL | Status: DC

## 2018-07-13 MED ORDER — ATORVASTATIN CALCIUM 80 MG PO TABS: 80.0000 mg | ORAL_TABLET | Freq: Every day | ORAL | Status: DC

## 2018-07-13 MED ORDER — ALBUTEROL SULFATE (2.5 MG/3ML) 0.083% IN NEBU: 2.5000 mg | INHALATION_SOLUTION | RESPIRATORY_TRACT | Status: DC | PRN

## 2018-07-13 MED ORDER — ACETAMINOPHEN 160 MG/5ML PO SOLN: 650.0000 mg | ORAL | Status: DC | PRN
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
## 2018-07-13 NOTE — ED Notes (Signed)
Patient requesting an update on plan of care. Dr. Billy Fischer made aware

## 2018-07-13 NOTE — ED Notes (Signed)
Son left to go eat he said if needed to call him at [phone]

## 2018-07-13 NOTE — ED Triage Notes (Signed)
[facility] that [facility]. Per pts roommate, pt had sudden onset of dizziness and then had a syncopal episode. LSN 0940. Upon ems arrival, pt had weakness to R side of his body, slurred speech, inability to follow commands and AMS. cbg 167. resp e/u upon arrival. Pt alert but unable to follow commands, speech remains slurred. Patient cleared at the bridge by Dr. Billy Fischer and taken to CT scan immediately upon arrival.

## 2018-07-13 NOTE — ED Notes (Signed)
Patient's son at bedside states that patient's speech is slurred at baseline from previous stroke.

## 2018-07-13 NOTE — Progress Notes (Signed)
Per transport, pt needs meds before coming to MR. Will try again a little later when pt is ready.

## 2018-07-13 NOTE — ED Notes (Signed)
ED Provider at bedside. 

## 2018-07-13 NOTE — ED Notes (Signed)
Pt ambulatory to restroom with EDT, exhibits steady gait

## 2018-07-13 NOTE — Progress Notes (Signed)
[name] is a 63 yo male presents to ED via [facility] from halfway house with acute onset of dizziness. Per roommate pt became dizzy followed by a syncopal episode. EMS noted Right side weakness, slurred speech, AMS and unable to follow commands. CBG 167, NIH 6, LKW 0940
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
## 2018-07-13 NOTE — ED Notes (Addendum)
Pt walked to bathroom with a walker, did not need assistance with toileting but provided SBA for safety. Uses a walker and a cane at home per pt

## 2018-07-13 NOTE — Progress Notes (Signed)
Notifed MD patient wishes to leave AMA.

## 2018-07-14 NOTE — Discharge Summary (Signed)
[facility] Discharge Summary  Patient name: [name] Medical record number: [id number] Date of birth: [birth year] Age: 63 y.o. Gender: male Date of Admission: 07/13/2018  Date of Discharge: 07/14/18   Admitting Physician: Blane Ohara McDiarmid, MD  Primary Care Provider: Zenia Resides, MD Consultants: None  Indication for Hospitalization: TIA (transient ischemic attack)   Discharge Diagnoses/Problem List:  Principal Problem:   TIA (transient ischemic attack) Active Problems:   Hyperlipidemia   Hepatitis C, chronic (HCC)   Peripheral arterial disease (HCC)   GERD (gastroesophageal reflux disease)   Anemia, iron deficiency   History of alcohol abuse   Coronary artery disease involving native coronary artery of native heart without angina pectoris   Benign essential HTN   Prediabetes   Tobacco abuse   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 male with several previous CVAs with intracranial atherosclerosis and stenosis presents with one episode of loss of consciousness 07/13/18 morning. 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 Patient was admitted for observation for AMS work up concerning for stroke/TIA. CT studies were negative for any acute findings. He left prior to obtaining MRI. He  reported being back at his baseline neurologic status prior to [facility].   Issues for Follow Up Any continued neurological symptoms or episodes of AMS  Significant Procedures: none  Procedure Orders     ED EKG     EKG 12-Lead     EKG  Significant Labs and Imaging:  CBC: Recent Labs  Lab 07/13/18 1032  WBC 5.0  NEUTROABS 2.8  HGB 14.6  HCT 44.7  MCV 95.9  PLT 102   Basic Metabolic Panel: Recent Labs  Lab 07/13/18 1032 07/13/18 1036  NA 135  --   K 3.7  --   CL 103  --   CO2 24  --   GLUCOSE 155*  --   BUN 20  --   CREATININE 1.19 1.10  CALCIUM 9.0  --    GFR: Estimated Creatinine Clearance: 64.3 mL/min (by C-G formula based on SCr of 1.1 mg/dL). Liver Function Tests: Recent Labs  Lab 07/13/18 1032  AST 18  ALT 24  ALKPHOS 72  BILITOT 0.6  PROT 6.2*  ALBUMIN 3.5   No results for input(s): LIPASE, AMYLASE in the last 168 hours. No results for input(s): AMMONIA in the last 168 hours. Coagulation Profile: Recent Labs  Lab 07/13/18 1032  INR 1.00   Cardiac Enzymes: Recent Labs  Lab 07/13/18 1032  CKTOTAL 41*   BNP (last 3 results) No results for input(s): PROBNP in the last 8760 hours. HbA1C: No results for input(s): HGBA1C in the last 72 hours. CBG: Recent Labs  Lab 07/13/18 1030  GLUCAP 143*   Lipid  Profile: No results for input(s): CHOL, HDL, LDLCALC, TRIG, CHOLHDL, LDLDIRECT in the last 72 hours. Thyroid Function Tests: No results for input(s): TSH, T4TOTAL, FREET4, T3FREE, THYROIDAB in the last 72 hours. Anemia Panel: No results for input(s): VITAMINB12, FOLATE, FERRITIN, TIBC, IRON, RETICCTPCT in the last 72 hours. Urine analysis:    Component Value Date/Time   COLORURINE YELLOW 07/13/2018 [place] 07/13/2018 1244   LABSPEC >1.046 (H) 07/13/2018 1244   PHURINE 6.0 07/13/2018 [place] 07/13/2018 1244   HGBUR NEGATIVE 07/13/2018 1244   HGBUR negative 08/19/2008 1415   BILIRUBINUR NEGATIVE 07/13/2018  1244   BILIRUBINUR small (A) 11/23/2016 0943   BILIRUBINUR neg 01/24/2016 1448   KETONESUR NEGATIVE 07/13/2018 1244   PROTEINUR NEGATIVE 07/13/2018 1244   UROBILINOGEN 0.2 11/23/2016 0943   UROBILINOGEN 1.0 12/02/2014 1447   NITRITE NEGATIVE 07/13/2018 [place] 07/13/2018 1244   Sepsis Labs: Invalid input(s): PROCALCITONIN, LACTICIDVEN No results for input(s): TROPIPOC in the last 168 hours.  Ct Angio Head W Or Wo Contrast  Result Date: 07/13/2018 CLINICAL DATA:  Code stroke. Sudden onset slurred speech and altered mental status. EXAM: CT ANGIOGRAPHY HEAD AND NECK TECHNIQUE: Multidetector CT imaging of the head and neck was performed using the standard protocol during bolus administration of intravenous contrast. Multiplanar CT image reconstructions and MIPs were obtained to evaluate the vascular anatomy. Carotid stenosis measurements (when applicable) are obtained utilizing NASCET criteria, using the distal internal carotid diameter as the denominator. CONTRAST:  71mL ISOVUE-370 IOPAMIDOL (ISOVUE-370) INJECTION 76% COMPARISON:  Head CT earlier same day FINDINGS: CTA NECK FINDINGS Aortic arch: Aortic atherosclerosis. No aneurysm or dissection. Branching pattern is normal without significant/flow limiting origin stenosis. Right carotid system: Common carotid artery shows some atherosclerotic plaque but is sufficiently patent to the bifurcation region. At the right carotid bifurcation, there is soft and calcified plaque. Minimal diameter of the proximal ICA is 3.4 mm. Compared to a more distal cervical ICA diameter of 5 mm, this indicates a 30% stenosis. Left carotid system: Common carotid artery shows some plaque but is sufficiently patent to the bifurcation region. Calcified and soft plaque of the carotid bifurcation and ICA bulb. Minimal diameter at the distal bulb level is 3.6 mm. Compared to a more distal cervical ICA diameter of 5.2 mm, this indicates a 30% stenosis. Vertebral  arteries: Both vertebral artery origins are widely patent. Both vertebral arteries are patent through the cervical region to the foramen magnum. The left is dominant. Skeleton: Ordinary mid cervical spondylosis. Other neck: No mass or lymphadenopathy. Upper chest: Chronic scarring and volume loss of the medial right upper lung. Mild emphysema and scarring at both apices. Review of the MIP images confirms the above findings CTA HEAD FINDINGS Anterior circulation: Both internal carotid arteries are patent through the skull base and siphon regions. There is siphon atherosclerotic calcification but no stenosis greater than 30%. The anterior and middle cerebral vessels are patent without proximal stenosis, aneurysm or vascular malformation. No large or medium vessel occlusion is identified. Distal vessels show atherosclerotic irregularity. Posterior circulation: Both vertebral arteries are patent to the foramen magnum. There is atherosclerotic disease of both V4 segments with stenosis estimated at 50% on each side. Both vessels do show flow to the basilar. No basilar stenosis. Posterior circulation branch vessels are patent. Posterior cerebral arteries receive most of there supply from the anterior circulation. Distal vessels show atherosclerotic irregularity. Venous sinuses: Patent and normal. Anatomic variants: None significant. Review of the  MIP images confirms the above findings IMPRESSION: No large or medium vessel occlusion. Atherosclerotic disease at both carotid bifurcations. 30% stenosis in both ICA bulb regions. Aortic atherosclerosis. Distal vessel intracranial atherosclerotic irregularity. These results were communicated to Dr. Lorraine Lax at 11:15 [place] 2/15/2020by text page via the [facility] messaging system. Electronically Signed   By: Nelson Chimes M.D.   On: 07/13/2018 11:17   Ct Angio Neck W Or Wo Contrast  Result Date: 07/13/2018 CLINICAL DATA:  Code stroke. Sudden onset slurred speech and altered mental  status. EXAM: CT ANGIOGRAPHY HEAD AND NECK TECHNIQUE: Multidetector CT imaging of the head and neck was performed using the standard protocol during bolus administration of intravenous contrast. Multiplanar CT image reconstructions and MIPs were obtained to evaluate the vascular anatomy. Carotid stenosis measurements (when applicable) are obtained utilizing NASCET criteria, using the distal internal carotid diameter as the denominator. CONTRAST:  50mL ISOVUE-370 IOPAMIDOL (ISOVUE-370) INJECTION 76% COMPARISON:  Head CT earlier same day FINDINGS: CTA NECK FINDINGS Aortic arch: Aortic atherosclerosis. No aneurysm or dissection. Branching pattern is normal without significant/flow limiting origin stenosis. Right carotid system: Common carotid artery shows some atherosclerotic plaque but is sufficiently patent to the bifurcation region. At the right carotid bifurcation, there is soft and calcified plaque. Minimal diameter of the proximal ICA is 3.4 mm. Compared to a more distal cervical ICA diameter of 5 mm, this indicates a 30% stenosis. Left carotid system: Common carotid artery shows some plaque but is sufficiently patent to the bifurcation region. Calcified and soft plaque of the carotid bifurcation and ICA bulb. Minimal diameter at the distal bulb level is 3.6 mm. Compared to a more distal cervical ICA diameter of 5.2 mm, this indicates a 30% stenosis. Vertebral arteries: Both vertebral artery origins are widely patent. Both vertebral arteries are patent through the cervical region to the foramen magnum. The left is dominant. Skeleton: Ordinary mid cervical spondylosis. Other neck: No mass or lymphadenopathy. Upper chest: Chronic scarring and volume loss of the medial right upper lung. Mild emphysema and scarring at both apices. Review of the MIP images confirms the above findings CTA HEAD FINDINGS Anterior circulation: Both internal carotid arteries are patent through the skull base and siphon regions. There is  siphon atherosclerotic calcification but no stenosis greater than 30%. The anterior and middle cerebral vessels are patent without proximal stenosis, aneurysm or vascular malformation. No large or medium vessel occlusion is identified. Distal vessels show atherosclerotic irregularity. Posterior circulation: Both vertebral arteries are patent to the foramen magnum. There is atherosclerotic disease of both V4 segments with stenosis estimated at 50% on each side. Both vessels do show flow to the basilar. No basilar stenosis. Posterior circulation branch vessels are patent. Posterior cerebral arteries receive most of there supply from the anterior circulation. Distal vessels show atherosclerotic irregularity. Venous sinuses: Patent and normal. Anatomic variants: None significant. Review of the MIP images confirms the above findings IMPRESSION: No large or medium vessel occlusion. Atherosclerotic disease at both carotid bifurcations. 30% stenosis in both ICA bulb regions. Aortic atherosclerosis. Distal vessel intracranial atherosclerotic irregularity. These results were communicated to Dr. Lorraine Lax at 11:15 [place] 2/15/2020by text page via the [facility] messaging system. Electronically Signed   By: Nelson Chimes M.D.   On: 07/13/2018 11:17   Ct Cervical Spine Wo Contrast  Result Date: 07/13/2018 CLINICAL DATA:  63 year old with acute mental status changes and slurred speech. Patient also fell. Initial encounter. EXAM: CT CERVICAL SPINE WITHOUT CONTRAST TECHNIQUE: Multidetector CT imaging of the cervical spine was  performed without intravenous contrast. Multiplanar CT image reconstructions were also generated. COMPARISON:  None. FINDINGS: Patient motion blurred many of the images initially. The study was repeated with less motion, and a diagnostic study was obtained. Alignment: Head and neck tilt to the RIGHT. Anatomic POSTERIOR alignment. Skull base and vertebrae: No fractures identified involving the cervical spine. Facet  joints anatomically aligned throughout scattered minimal to mild degenerative changes. Coronal reformatted images demonstrate an intact craniocervical junction, intact dens and intact lateral masses throughout. Soft tissues and spinal canal: No evidence of paraspinous or spinal canal hematoma. No evidence of spinal stenosis. Disc levels: Moderate to severe disc space narrowing and associated mild hypertrophic endplate changes at Q2-2. Mild disc space narrowing at C6-7. No visible disc extrusions on the soft tissue window images. Predominant uncinate hypertrophy accounts for mild BILATERAL foraminal stenoses at C5-6. Remaining neural foramina widely patent. Upper chest: Visualized lung apices clear. Visualized superior mediastinum normal. Other: BILATERAL cervical carotid atherosclerosis. IMPRESSION: 1. Motion degraded examination demonstrates no evidence of cervical spine fracture. 2. Moderate to severe degenerative disc disease at C5-6 and mild degenerative disc disease at C6-7. Electronically Signed   By: Evangeline Dakin M.D.   On: 07/13/2018 11:19   Ct Head Code Stroke Wo Contrast  Result Date: 07/13/2018 CLINICAL DATA:  Code stroke. Sudden onset of altered mental status and slurred speech. EXAM: CT HEAD WITHOUT CONTRAST TECHNIQUE: Contiguous axial images were obtained from the base of the skull through the vertex without intravenous contrast. COMPARISON:  07/23/2017 FINDINGS: Brain: Severely motion degraded exam. Chronic appearing small vessel ischemic changes of the pons. No cerebellar insult. Cerebral hemispheres show atrophy with chronic small-vessel ischemic changes. I do not see evidence of an acute infarction. Vascular: There is atherosclerotic calcification of the major vessels at the base of the brain. No sign of acute hyperdense vessel. Skull: Negative Sinuses/Orbits: Clear/normal Other: None ASPECTS ([place] Stroke Program Early CT Score) not reliable because of the considerable motion  degradation. IMPRESSION: 1. Severely motion degraded exam. No acute finding. Chronic small-vessel ischemic changes. 2. ASPECTS is not reliable because of severe motion degradation. 3. These results were communicated to Dr. Lorraine Lax at 10:48 [place] 2/15/2020by text page via the [facility] messaging system. Electronically Signed   By: Nelson Chimes M.D.   On: 07/13/2018 10:51    Results/Tests Pending at Time of Discharge:  none  Discharge Medications:  Allergies as of 07/13/2018   No Known Allergies     Medication List    ASK your doctor about these medications   albuterol 108 (90 Base) MCG/ACT inhaler Commonly known as:  PROVENTIL HFA;VENTOLIN HFA Inhale 2 puffs into the lungs every 6 (six) hours as needed for wheezing or shortness of breath. Only use with chest tightness.   atorvastatin 80 MG tablet Commonly known as:  LIPITOR Take 1 tablet (80 mg total) by mouth daily at 6 PM.   clopidogrel 75 MG tablet Commonly known as:  PLAVIX Take 1 tablet (75 mg total) by mouth daily.   Fluticasone-Umeclidin-Vilant 100-62.5-25 MCG/INH Aepb Commonly known as:  TRELEGY ELLIPTA Inhale 1 puff into the lungs daily.   hydrochlorothiazide 12.5 MG tablet Commonly known as:  HYDRODIURIL Take 1 tablet (12.5 mg total) by mouth daily.   omeprazole 40 MG capsule Commonly known as:  PRILOSEC Take 1 capsule (40 mg total) by mouth daily.       Discharge Instructions: Please refer to Patient Instructions section of EMR for full details.  Patient was counseled important signs and  symptoms that should prompt return to medical care, changes in medications, dietary instructions, activity restrictions, and follow up appointments.   Follow-Up Appointments: Future Appointments  Date Time Provider [place]  08/14/2018  3:45 PM Venancio Poisson, NP [facility] None     Wilber Oliphant, MD 07/14/2018, 7:54 PM PGY-1, [place]

## 2018-08-06 ENCOUNTER — Other Ambulatory Visit: Payer: Self-pay

## 2018-08-06 ENCOUNTER — Other Ambulatory Visit: Payer: Self-pay | Admitting: Family Medicine

## 2018-08-06 ENCOUNTER — Ambulatory Visit (INDEPENDENT_AMBULATORY_CARE_PROVIDER_SITE_OTHER): Payer: Medicare HMO | Admitting: Family Medicine

## 2018-08-06 VITALS — BP 114/74 | HR 101 | Temp 97.5°F | Ht 67.0 in | Wt 166.6 lb

## 2018-08-06 DIAGNOSIS — I10 Essential (primary) hypertension: Secondary | ICD-10-CM | POA: Diagnosis not present

## 2018-08-06 DIAGNOSIS — R220 Localized swelling, mass and lump, head: Secondary | ICD-10-CM

## 2018-08-06 DIAGNOSIS — J449 Chronic obstructive pulmonary disease, unspecified: Secondary | ICD-10-CM

## 2018-08-06 DIAGNOSIS — J441 Chronic obstructive pulmonary disease with (acute) exacerbation: Secondary | ICD-10-CM | POA: Diagnosis not present

## 2018-08-06 MED ORDER — ALBUTEROL SULFATE HFA 108 (90 BASE) MCG/ACT IN AERS: 2.0000 | INHALATION_SPRAY | Freq: Four times a day (QID) | RESPIRATORY_TRACT | 1 refills | Status: DC | PRN

## 2018-08-06 MED ORDER — FLUTICASONE-UMECLIDIN-VILANT 100-62.5-25 MCG/INH IN AEPB: 1.0000 | INHALATION_SPRAY | Freq: Every day | RESPIRATORY_TRACT | 6 refills | Status: DC

## 2018-08-06 MED ORDER — HYDROCHLOROTHIAZIDE 12.5 MG PO TABS: 12.5000 mg | ORAL_TABLET | Freq: Every day | ORAL | 3 refills | Status: DC

## 2018-08-06 MED ORDER — ATORVASTATIN CALCIUM 80 MG PO TABS: 80.0000 mg | ORAL_TABLET | Freq: Every day | ORAL | 3 refills | Status: DC

## 2018-08-06 NOTE — Progress Notes (Signed)
Subjective:  Patient ID: [name]  DOB: [birth year] MRN: [id number]  [name] is a 63 y.o. male with a PMH of chronic hepatitis C, PAD, CAD, HTN, prediabetes, H/o CVA, COPD, H/o lung cancer here today for yellow skin and facial swelling.   HPI:  Facial swelling Patient reports that for the past few months he feels as if his face is started swelling.  States that he has not had any change in medications or in his habits.  States that he remains sober and is not using any illicit drugs.  Patient denies any leg swelling or shortness of breath.  States that he has not really had any big changes to his diet or lifestyle.  Yellowing of skin Patient with history of chronic hep C as well as history of alcohol abuse however he states he has not had a drink in >10 years.  States that his sister noticed that he looked a little more yellow so he wanted to come here today in order to have this checked out.  Patient otherwise he is feeling normal and does not have any concerns.  He denies any nausea, vomiting, diarrhea, constipation.  States he does not have any abdominal pain.  States that he does not feel as if his abdomen is distended.  ROS: All other systems otherwise negative, except as mentioned in HPI  Family History  Problem Relation Age of Onset  . Dementia Mother   . Heart disease Brother   . Cancer Brother        Lung CA  . Heart attack Brother   . Coronary artery disease Brother 72       deceased  . Cancer Brother        Unknown CA  . Colon cancer Neg Hx   . Stomach cancer Neg Hx     Social hx: Denies use of illicit drugs, alcohol use Smoking status reviewed-patient has again started smoking even with history of lung cancer  Patient Active Problem List   Diagnosis Date Noted  . Facial swelling 08/12/2018  . TIA (transient ischemic attack) 07/13/2018  . CVA (cerebral vascular accident) ([place])   . Palliative care encounter 03/25/2018  . Housing  situation unstable 11/15/2017  . Late effect of cerebrovascular accident (CVA) 09/27/2017  . 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 Gait disturbance, post-stroke   . Coronary artery disease involving native coronary artery of native heart without angina pectoris   . Benign essential HTN   . Prediabetes   . Tobacco abuse   . History of alcohol abuse   . Anemia, iron deficiency 04/04/2017  . Peripheral arterial disease ([place])   . GERD (gastroesophageal reflux disease)   . Hepatitis C, chronic ([place]) 06/16/2015  . Hyperlipidemia 12/28/2010  . COPD, moderate ([place]) 08/13/2006     Objective:  BP 114/74   Pulse (!) 101   Temp (!) 97.5 F (36.4 C) (Oral)   Ht 5\' 7"  (1.702 m)   Wt 166 lb 9.6 oz (75.6 kg)   SpO2 93%   BMI 26.09 kg/m   Vitals and nursing note reviewed  General: NAD, pleasant, mild jaundice Cardiac: RRR, normal heart sounds, no m/r/g Pulm: normal effort, diffuse expiratory wheeze, crackles noted in RLL GI: Palpable liver, no fluid wave noted, soft, nontender, mildly distended, bowel sounds x4 Extremities: no edema or cyanosis. WWP. Skin: warm and dry, no rashes noted Neuro: alert and oriented, no focal deficits  Psych: normal affect, normal thought content  Assessment & Plan:   COPD, moderate (HCC) Refilled patient's Trelegy and albuterol as he is out at this time.  Given findings on exam of crackles in right lower lobe as well as diffuse expiratory wheezing will send patient for chest x-ray to rule out any pneumonia.  Patient did not appear fluid overloaded on exam. Patient has started smoking again even though he has history of lung cancer and encouraged him to stop this.  Patient is to follow-up with his PCP next week. Will decide to treat for possible COPD exacerbation based on chest x-ray findings.  08/08/2018: Chest x-ray shows lungs mildly hyperexpanded. Stable scarring and post radiation therapy change right  perihilar region. No edema or consolidation. Stable cardiac silhouette. -Patient should keep appointment with PCP however no indication for treatment of COPD at this time given that patient was asymptomatic without cough or shortness of breath and findings were incidentally noted on exam.  Facial swelling Patient coming in today for concern of facial swelling as well as yellowing of the skin.  Face does appear a little swollen on exam after being evaluated with PCP Dr. Andria Frames at bedside.  Will obtain CBC and CMP in order to evaluate for worsening liver function.  Patient with no lower extremity edema however and no signs of fluid overload on exam. Patient reports no new medications at this time.  Unclear etiology, has follow-up scheduled with PCP next week.  Yellowing of skin Patient with history of chronic hep C and reports that he has been sober for greater than 10 years.  Patient appears mildly jaundiced on exam but this also appears to be patient's baseline.  No signs of fluid wave or ascites on exam.  Will obtain CMP in order to evaluate for worsening liver function.  Patient given strict return precautions.   [place] Adrie Picking, DO Family Medicine Resident PGY-2

## 2018-08-06 NOTE — Patient Instructions (Signed)
Thank you for coming to see me today. It was a pleasure! Today we talked about:   We will call you with your lab results and if you need to start taking any medications.  I have sent a refill of your inhalers to the pharmacy.  Please take these of your having any shortness of breath.  Please do not hesitate to go to the emergency room if you are experiencing trouble breathing or shortness of breath.  Please follow-up with Dr. Andria Frames next Monday at 11:10 AM, or sooner as needed.  If you have any questions or concerns, please do not hesitate to call the office at [phone].  Take Care,   [place] Ulyana Pitones, DO

## 2018-08-07 ENCOUNTER — Other Ambulatory Visit: Payer: Self-pay

## 2018-08-07 ENCOUNTER — Ambulatory Visit
Admission: RE | Admit: 2018-08-07 | Discharge: 2018-08-07 | Disposition: A | Discharge status: Home / Self Care | Payer: Medicare HMO | Source: Ambulatory Visit | Attending: Family Medicine | Admitting: Family Medicine

## 2018-08-07 DIAGNOSIS — J449 Chronic obstructive pulmonary disease, unspecified: Secondary | ICD-10-CM

## 2018-08-07 DIAGNOSIS — J984 Other disorders of lung: Secondary | ICD-10-CM | POA: Diagnosis not present

## 2018-08-07 LAB — CBC WITH DIFFERENTIAL/PLATELET
Basophils Absolute: 0 10*3/uL (ref 0.0–0.2)
Basos: 0 %
EOS (ABSOLUTE): 0.1 10*3/uL (ref 0.0–0.4)
Eos: 1 %
Hematocrit: 45 % (ref 37.5–51.0)
Hemoglobin: 15.8 g/dL (ref 13.0–17.7)
Immature Grans (Abs): 0 10*3/uL (ref 0.0–0.1)
Immature Granulocytes: 1 %
LYMPHS ABS: 1.7 10*3/uL (ref 0.7–3.1)
Lymphs: 22 %
MCH: 32.3 pg (ref 26.6–33.0)
MCHC: 35.1 g/dL (ref 31.5–35.7)
MCV: 92 fL (ref 79–97)
MONOCYTES: 11 %
Monocytes Absolute: 0.9 10*3/uL (ref 0.1–0.9)
Neutrophils Absolute: 5.1 10*3/uL (ref 1.4–7.0)
Neutrophils: 65 %
Platelets: 290 10*3/uL (ref 150–450)
RBC: 4.89 x10E6/uL (ref 4.14–5.80)
RDW: 11.8 % (ref 11.6–15.4)
WBC: 7.8 10*3/uL (ref 3.4–10.8)

## 2018-08-07 LAB — COMPREHENSIVE METABOLIC PANEL
ALK PHOS: 126 IU/L — AB (ref 39–117)
ALT: 24 IU/L (ref 0–44)
AST: 16 IU/L (ref 0–40)
Albumin/Globulin Ratio: 1.7 (ref 1.2–2.2)
Albumin: 4.5 g/dL (ref 3.8–4.8)
BUN/Creatinine Ratio: 16 (ref 10–24)
BUN: 16 mg/dL (ref 8–27)
Bilirubin Total: 0.4 mg/dL (ref 0.0–1.2)
CO2: 23 mmol/L (ref 20–29)
CREATININE: 1.03 mg/dL (ref 0.76–1.27)
Calcium: 9.9 mg/dL (ref 8.6–10.2)
Chloride: 97 mmol/L (ref 96–106)
GFR calc Af Amer: 89 mL/min/{1.73_m2} (ref >59)
GFR calc non Af Amer: 77 mL/min/{1.73_m2} (ref >59)
GLUCOSE: 101 mg/dL — AB (ref 65–99)
Globulin, Total: 2.6 g/dL (ref 1.5–4.5)
Potassium: 5 mmol/L (ref 3.5–5.2)
Sodium: 135 mmol/L (ref 134–144)
Total Protein: 7.1 g/dL (ref 6.0–8.5)

## 2018-08-08 ENCOUNTER — Other Ambulatory Visit: Payer: Self-pay | Admitting: *Deleted

## 2018-08-08 DIAGNOSIS — K219 Gastro-esophageal reflux disease without esophagitis: Secondary | ICD-10-CM

## 2018-08-09 MED ORDER — OMEPRAZOLE 40 MG PO CPDR: 40.0000 mg | DELAYED_RELEASE_CAPSULE | Freq: Every day | ORAL | 3 refills | Status: DC

## 2018-08-12 ENCOUNTER — Ambulatory Visit: Payer: Medicare HMO | Admitting: Family Medicine

## 2018-08-12 DIAGNOSIS — R220 Localized swelling, mass and lump, head: Secondary | ICD-10-CM | POA: Insufficient documentation

## 2018-08-12 NOTE — Assessment & Plan Note (Signed)
Refilled patient's Trelegy and albuterol as he is out at this time.  Given findings on exam of crackles in right lower lobe as well as diffuse expiratory wheezing will send patient for chest x-ray to rule out any pneumonia.  Patient did not appear fluid overloaded on exam. Patient has started smoking again even though he has history of lung cancer and encouraged him to stop this.  Patient is to follow-up with his PCP next week. Will decide to treat for possible COPD exacerbation based on chest x-ray findings.  08/08/2018: Chest x-ray shows lungs mildly hyperexpanded. Stable scarring and post radiation therapy change right perihilar region. No edema or consolidation. Stable cardiac silhouette. -Patient should keep appointment with PCP however no indication for treatment of COPD at this time given that patient was asymptomatic without cough or shortness of breath and findings were incidentally noted on exam.

## 2018-08-12 NOTE — Assessment & Plan Note (Addendum)
Patient coming in today for concern of facial swelling as well as yellowing of the skin.  Face does appear a little swollen on exam after being evaluated with PCP Dr. Andria Frames at bedside.  Will obtain CBC and CMP in order to evaluate for worsening liver function.  Patient with no lower extremity edema however and no signs of fluid overload on exam. Patient reports no new medications at this time.  Unclear etiology, has follow-up scheduled with PCP next week.

## 2018-08-13 ENCOUNTER — Telehealth: Payer: Self-pay | Admitting: *Deleted

## 2018-08-13 NOTE — Telephone Encounter (Signed)
-----   Message from [place] Shirley, DO sent at 08/09/2018  4:22 PM EDT ----- Please call patient and inform him of normal results from office visit. Has follow up with [facility] next week.

## 2018-08-13 NOTE — Telephone Encounter (Signed)
Tried to reach patient but was unable to leave a message.  Patient has his follow up appt tomorrow with Dr. Andria Frames.  Jazmin Hartsell,CMA

## 2018-08-14 ENCOUNTER — Ambulatory Visit: Payer: Medicare HMO | Admitting: Adult Health

## 2018-08-14 ENCOUNTER — Ambulatory Visit (INDEPENDENT_AMBULATORY_CARE_PROVIDER_SITE_OTHER): Payer: Medicare HMO | Admitting: Family Medicine

## 2018-08-14 ENCOUNTER — Other Ambulatory Visit: Payer: Self-pay

## 2018-08-14 ENCOUNTER — Encounter: Payer: Self-pay | Admitting: Family Medicine

## 2018-08-14 DIAGNOSIS — J449 Chronic obstructive pulmonary disease, unspecified: Secondary | ICD-10-CM
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
## 2018-08-14 NOTE — Assessment & Plan Note (Signed)
In retrospect, last week was likely an atypical COPD exac.  Now back to baseline.  High risk for problems given his COPD, previous cancer and continued smoking.  No intent to quit.

## 2018-08-14 NOTE — Patient Instructions (Signed)
You look much better than last visit.  I am glad.  Go home and stay away from sick people.  Because you are above 60 and have lung problems, any infection with you is serious.

## 2018-10-30 ENCOUNTER — Telehealth: Payer: Self-pay

## 2018-10-30 NOTE — Telephone Encounter (Signed)
Unable to reach pt. Tried to call pt about video visit and his vm was not set up.

## 2018-10-31 NOTE — Telephone Encounter (Signed)
Tried to call pt again vm not set up. Unable to reach pt to discuss visit being change to video. Appt will be cancel and pt will need to r/s.

## 2018-11-04 ENCOUNTER — Ambulatory Visit: Payer: Medicare HMO | Admitting: Adult Health

## 2018-11-04 ENCOUNTER — Other Ambulatory Visit: Payer: Self-pay

## 2018-11-04 ENCOUNTER — Telehealth (INDEPENDENT_AMBULATORY_CARE_PROVIDER_SITE_OTHER): Payer: Medicare HMO | Admitting: Family Medicine

## 2018-11-04 DIAGNOSIS — R0789 Other chest pain: Secondary | ICD-10-CM | POA: Insufficient documentation

## 2018-11-04 DIAGNOSIS — R0781 Pleurodynia: Secondary | ICD-10-CM | POA: Diagnosis not present

## 2018-11-04 MED ORDER — IBUPROFEN 600 MG PO TABS: 600.0000 mg | ORAL_TABLET | Freq: Three times a day (TID) | ORAL | 0 refills | Status: DC | PRN

## 2018-11-04 NOTE — Progress Notes (Signed)
[place] Telemedicine Visit  I connected with  [name] on 11/04/18 by a video enabled telemedicine application and verified that I am speaking with the correct person using two identifiers.   I discussed the limitations of evaluation and management by telemedicine. The patient expressed understanding and agreed to proceed.  Patient consented to have virtual visit. Method of visit: Telephone  Encounter participants: Patient: [name] - located at home Provider: Caroline More - located at [facility] Others (if applicable): None  Chief Complaint: Pain in ribs  HPI: Rib pain  Patient reports this morning he went to bathroom and fell. When patient fell he landed on his ribs on the right side. Patient reports that now he is having really bad pain. Denies any LOC. Patient slipped on the floor and fell. Denies any alcohol use. Denies illicit drug use. Denies any swelling or bruising. Denies difficulty breathing.   ROS: per HPI  Pertinent PMHx: CVA, COPD, h/o gait disturbance,   Exam:  Respiratory: speaking full sentences, no increased WOB, denies difficulty breathing   Assessment/Plan:  Rib pain Patient has right-sided rib pain after fall in bathroom.  Patient took a fall.  History of gait disturbance so this seems reasonable.  Denies any loss of consciousness.  Denies any shortness of breath or difficulty breathing.  Patient states he just has pain on his right side and is hoping for some medicine that will help get rid of this.  Patient had slurred speech but I discussed this with Dr. Andria Frames who says this is chronic after CVA.  Will not need x-ray at this time as he is not having any respiratory symptoms.  Will give ibuprofen 600 mg as needed.  If no improvement can increase or choose other medication.  Strict return precautions given.  Advised that if any respiratory symptoms come in and he should go to the emergency room to be evaluated  for possible pneumothorax or other lung wall injury.  Strict return precautions given.  Discussed this with Dr. Andria Frames his PCP as well.    Time spent during visit with patient: 15 minutes

## 2018-11-04 NOTE — Assessment & Plan Note (Signed)
Patient has right-sided rib pain after fall in bathroom.  Patient took a fall.  History of gait disturbance so this seems reasonable.  Denies any loss of consciousness.  Denies any shortness of breath or difficulty breathing.  Patient states he just has pain on his right side and is hoping for some medicine that will help get rid of this.  Patient had slurred speech but I discussed this with Dr. Andria Frames who says this is chronic after CVA.  Will not need x-ray at this time as he is not having any respiratory symptoms.  Will give ibuprofen 600 mg as needed.  If no improvement can increase or choose other medication.  Strict return precautions given.  Advised that if any respiratory symptoms come in and he should go to the emergency room to be evaluated for possible pneumothorax or other lung wall injury.  Strict return precautions given.  Discussed this with Dr. Andria Frames his PCP as well.

## 2018-11-04 NOTE — Progress Notes (Signed)
Noted and agree. 

## 2018-11-12 ENCOUNTER — Ambulatory Visit (HOSPITAL_COMMUNITY)
Admission: RE | Admit: 2018-11-12 | Payer: Medicare HMO | Source: Ambulatory Visit | Attending: Cardiovascular Disease | Admitting: Cardiovascular Disease

## 2018-11-12 ENCOUNTER — Encounter (HOSPITAL_COMMUNITY): Payer: Medicare HMO

## 2018-11-14 ENCOUNTER — Emergency Department (HOSPITAL_COMMUNITY): Payer: Medicare HMO

## 2018-11-14 ENCOUNTER — Other Ambulatory Visit: Payer: Self-pay

## 2018-11-14 ENCOUNTER — Emergency Department (HOSPITAL_COMMUNITY)
Admission: EM | Admit: 2018-11-14 | Discharge: 2018-11-14 | Disposition: A | Discharge status: Home / Self Care | Payer: Medicare HMO | Attending: Emergency Medicine | Admitting: Emergency Medicine

## 2018-11-14 ENCOUNTER — Encounter (HOSPITAL_COMMUNITY): Payer: Self-pay | Admitting: Emergency Medicine

## 2018-11-14 ENCOUNTER — Telehealth: Payer: Self-pay | Admitting: Neurology

## 2018-11-14 DIAGNOSIS — I2510 Atherosclerotic heart disease of native coronary artery without angina pectoris: Secondary | ICD-10-CM | POA: Diagnosis not present

## 2018-11-14 DIAGNOSIS — R29898 Other symptoms and signs involving the musculoskeletal system: Secondary | ICD-10-CM | POA: Diagnosis not present

## 2018-11-14 DIAGNOSIS — G459 Transient cerebral ischemic attack, unspecified: Secondary | ICD-10-CM | POA: Diagnosis not present

## 2018-11-14 DIAGNOSIS — I10 Essential (primary) hypertension: Secondary | ICD-10-CM | POA: Insufficient documentation

## 2018-11-14 DIAGNOSIS — J449 Chronic obstructive pulmonary disease, unspecified: Secondary | ICD-10-CM | POA: Diagnosis not present

## 2018-11-14 DIAGNOSIS — S2241XA Multiple fractures of ribs, right side, initial encounter for closed fracture: Secondary | ICD-10-CM | POA: Insufficient documentation

## 2018-11-14 DIAGNOSIS — Y929 Unspecified place or not applicable: Secondary | ICD-10-CM | POA: Insufficient documentation

## 2018-11-14 DIAGNOSIS — Y939 Activity, unspecified: Secondary | ICD-10-CM | POA: Insufficient documentation

## 2018-11-14 DIAGNOSIS — Z79899 Other long term (current) drug therapy: Secondary | ICD-10-CM | POA: Insufficient documentation

## 2018-11-14 DIAGNOSIS — Y999 Unspecified external cause status: Secondary | ICD-10-CM | POA: Diagnosis not present

## 2018-11-14 DIAGNOSIS — F17210 Nicotine dependence, cigarettes, uncomplicated: Secondary | ICD-10-CM | POA: Insufficient documentation

## 2018-11-14 DIAGNOSIS — R404 Transient alteration of awareness: Secondary | ICD-10-CM | POA: Diagnosis not present

## 2018-11-14 DIAGNOSIS — R000 Tachycardia, unspecified: Secondary | ICD-10-CM | POA: Diagnosis not present

## 2018-11-14 DIAGNOSIS — R29810 Facial weakness: Secondary | ICD-10-CM | POA: Diagnosis present

## 2018-11-14 DIAGNOSIS — Z8673 Personal history of transient ischemic attack (TIA), and cerebral infarction without residual deficits: Secondary | ICD-10-CM | POA: Insufficient documentation

## 2018-11-14 DIAGNOSIS — R531 Weakness: Secondary | ICD-10-CM | POA: Diagnosis not present

## 2018-11-14 DIAGNOSIS — M6281 Muscle weakness (generalized): Secondary | ICD-10-CM | POA: Diagnosis not present

## 2018-11-14 DIAGNOSIS — R262 Difficulty in walking, not elsewhere classified: Secondary | ICD-10-CM | POA: Diagnosis not present

## 2018-11-14 DIAGNOSIS — I6523 Occlusion and stenosis of bilateral carotid arteries: Secondary | ICD-10-CM | POA: Diagnosis not present

## 2018-11-14 DIAGNOSIS — W010XXA Fall on same level from slipping, tripping and stumbling without subsequent striking against object, initial encounter: Secondary | ICD-10-CM | POA: Insufficient documentation

## 2018-11-14 LAB — RAPID URINE DRUG SCREEN, HOSP PERFORMED
Amphetamines: NOT DETECTED
Barbiturates: NOT DETECTED
Benzodiazepines: NOT DETECTED
Cocaine: NOT DETECTED
Opiates: NOT DETECTED
Tetrahydrocannabinol: NOT DETECTED

## 2018-11-14 LAB — CBG MONITORING, ED: Glucose-Capillary: 153 mg/dL — ABNORMAL HIGH (ref 70–99)

## 2018-11-14 LAB — I-STAT CHEM 8, ED
BUN: 17 mg/dL (ref 8–23)
Calcium, Ion: 1.16 mmol/L (ref 1.15–1.40)
Chloride: 99 mmol/L (ref 98–111)
Creatinine, Ser: 0.9 mg/dL (ref 0.61–1.24)
Glucose, Bld: 152 mg/dL — ABNORMAL HIGH (ref 70–99)
HCT: 46 % (ref 39.0–52.0)
Hemoglobin: 15.6 g/dL (ref 13.0–17.0)
Potassium: 4 mmol/L (ref 3.5–5.1)
Sodium: 134 mmol/L — ABNORMAL LOW (ref 135–145)
TCO2: 24 mmol/L (ref 22–32)

## 2018-11-14 LAB — COMPREHENSIVE METABOLIC PANEL
ALT: 23 U/L (ref 0–44)
AST: 20 U/L (ref 15–41)
Albumin: 3.8 g/dL (ref 3.5–5.0)
Alkaline Phosphatase: 82 U/L (ref 38–126)
Anion gap: 11 (ref 5–15)
BUN: 16 mg/dL (ref 8–23)
CO2: 21 mmol/L — ABNORMAL LOW (ref 22–32)
Calcium: 9.4 mg/dL (ref 8.9–10.3)
Chloride: 102 mmol/L (ref 98–111)
Creatinine, Ser: 0.99 mg/dL (ref 0.61–1.24)
GFR calc Af Amer: >60 mL/min (ref >60)
GFR calc non Af Amer: >60 mL/min (ref >60)
Glucose, Bld: 153 mg/dL — ABNORMAL HIGH (ref 70–99)
Potassium: 4.1 mmol/L (ref 3.5–5.1)
Sodium: 134 mmol/L — ABNORMAL LOW (ref 135–145)
Total Bilirubin: 0.6 mg/dL (ref 0.3–1.2)
Total Protein: 6.4 g/dL — ABNORMAL LOW (ref 6.5–8.1)

## 2018-11-14 LAB — URINALYSIS, ROUTINE W REFLEX MICROSCOPIC
Bilirubin Urine: NEGATIVE
Glucose, UA: NEGATIVE mg/dL
Hgb urine dipstick: NEGATIVE
Ketones, ur: NEGATIVE mg/dL
Leukocytes,Ua: NEGATIVE
Nitrite: NEGATIVE
Protein, ur: NEGATIVE mg/dL
Specific Gravity, Urine: 1.032 — ABNORMAL HIGH (ref 1.005–1.030)
pH: 5 (ref 5.0–8.0)

## 2018-11-14 LAB — DIFFERENTIAL
Abs Immature Granulocytes: 0.05 10*3/uL (ref 0.00–0.07)
Basophils Absolute: 0 10*3/uL (ref 0.0–0.1)
Basophils Relative: 0 %
Eosinophils Absolute: 0 10*3/uL (ref 0.0–0.5)
Eosinophils Relative: 0 %
Immature Granulocytes: 0 %
Lymphocytes Relative: 4 %
Lymphs Abs: 0.5 10*3/uL — ABNORMAL LOW (ref 0.7–4.0)
Monocytes Absolute: 0.7 10*3/uL (ref 0.1–1.0)
Monocytes Relative: 6 %
Neutro Abs: 10 10*3/uL — ABNORMAL HIGH (ref 1.7–7.7)
Neutrophils Relative %: 90 %

## 2018-11-14 LAB — PROTIME-INR
INR: 1 (ref 0.8–1.2)
Prothrombin Time: 13 seconds (ref 11.4–15.2)

## 2018-11-14 LAB — CBC
HCT: 43.4 % (ref 39.0–52.0)
Hemoglobin: 14.6 g/dL (ref 13.0–17.0)
MCH: 32 pg (ref 26.0–34.0)
MCHC: 33.6 g/dL (ref 30.0–36.0)
MCV: 95.2 fL (ref 80.0–100.0)
Platelets: 200 10*3/uL (ref 150–400)
RBC: 4.56 MIL/uL (ref 4.22–5.81)
RDW: 12.6 % (ref 11.5–15.5)
WBC: 11.2 10*3/uL — ABNORMAL HIGH (ref 4.0–10.5)
nRBC: 0 % (ref 0.0–0.2)

## 2018-11-14 LAB — APTT: aPTT: 30 seconds (ref 24–36)

## 2018-11-14 MED ORDER — SODIUM CHLORIDE 0.9 % IV BOLUS
1000.0000 mL | Freq: Once | INTRAVENOUS | Status: AC
  Administered 2018-11-14: 1000 mL via INTRAVENOUS

## 2018-11-14 MED ORDER — CLOPIDOGREL BISULFATE 75 MG PO TABS: 75.0000 mg | ORAL_TABLET | Freq: Every day | ORAL | 0 refills | Status: DC

## 2018-11-14 MED ORDER — LORAZEPAM 2 MG/ML IJ SOLN
1.0000 mg | Freq: Once | INTRAMUSCULAR | Status: AC | PRN
  Administered 2018-11-14: 1 mg via INTRAVENOUS
  Filled 2018-11-14: qty 1

## 2018-11-14 MED ORDER — IOHEXOL 350 MG/ML SOLN
75.0000 mL | Freq: Once | INTRAVENOUS | Status: AC | PRN
  Administered 2018-11-14: 11:00:00 75 mL via INTRAVENOUS

## 2018-11-14 MED ORDER — HYDROCODONE-ACETAMINOPHEN 5-325 MG PO TABS: 1.0000 | ORAL_TABLET | Freq: Four times a day (QID) | ORAL | 0 refills | Status: DC | PRN

## 2018-11-14 MED ORDER — FENTANYL CITRATE (PF) 100 MCG/2ML IJ SOLN
50.0000 ug | Freq: Once | INTRAMUSCULAR | Status: AC
  Administered 2018-11-14: 50 ug via INTRAVENOUS
  Filled 2018-11-14: qty 2

## 2018-11-14 MED ORDER — ASPIRIN 81 MG PO CHEW: 324.0000 mg | CHEWABLE_TABLET | Freq: Every day | ORAL | 0 refills | Status: DC

## 2018-11-14 NOTE — ED Triage Notes (Signed)
Pt arrives via EMS from home as Code Stroke. LSN 9:30 with reports of right side facial droop, left leg weakness and worsening slurred speech. Pt reports falling 2 days ago and complains of right rib pain from hitting the bathtub. Hx of CVA.

## 2018-11-14 NOTE — Consult Note (Signed)
Neurology Consultation  Reason for Consult: Code stroke Referring Physician: Dr. Darl Householder  CC: left leg weakness  History is obtained from: patient, chart  HPI: [name] is a 63 y.o. 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  Patient had been having multiple falls over the last week, at one point had a fall in the bathtub and hurt his ribs but this presentation is for sudden onset of left leg weakness with last known normal at 9:30 AM. He says that he was unable to bear weight on that leg.  That is his good leg as it has not been affected by the prior stroke and he found it hard to balance himself on that.  He does report having frequent falls over the past 1 week for which she has seen his primary care. In 2019, he had a left cerebral small vessel stroke that involved the left pons and left caudate/basal ganglia.  He has been followed by [facility] neurology in clinic.  He is supposed to be on Plavix and atorvastatin. 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  He does continue to smoke 2 packs of cigarettes a day. Denies any chest pain nausea vomiting tingling numbness.  LKW: 9:30 AM on 11/14/2018 tpa given?: no, low delta NIH Premorbid modified Rankin scale (mRS): At least a 2.  At times uses a walker to walk as well but is able to take care of all his ADLs himself.  ROS: ROS was performed and is negative except as noted in the HPI.   Past Medical History:  Diagnosis Date  . CAD (coronary artery disease) 06/30/2015  . COLONIC POLYPS, ADENOMATOUS, HX OF 02/24/2008   Polyps removed last colonoscopy 06/11/12 - adenomas     . COPD exacerbation ([place]) 02/04/2014  . COPD, moderate ([place]) 08/13/2006    Qualifier: Diagnosis of  By: Andria Frames MD, Gwyndolyn Saxon    . CVA (cerebral vascular accident) ([place])    x4  . GERD (gastroesophageal reflux disease)   . H/O: lung cancer right side  . Headache   . Hepatitis C infection   . History of blood transfusion 1981  . Hypertension 12/28/2010  . INSOMNIA NOS 07/26/2006   Qualifier: Diagnosis of  By: Andria Frames MD, [place] 11/27/2008   Qualifier: Diagnosis of  By: Andria Frames MD, Gwyndolyn Saxon    . Major depressive disorder, single episode, moderate ([place]) 82/99/3716   Conflicted family dynamic.   Marland Kitchen MIGRAINE HEADACHE 01/11/2010   Qualifier: Diagnosis of  By: Doreene Nest MD, Maudie Mercury    . NECK PAIN 11/15/2007   Qualifier: Diagnosis of  By: Andria Frames MD, Gwyndolyn Saxon    . Pain of right lower leg 04/08/2015  . Peripheral arterial disease (HCC)    a. s/p PV angiogram on 07/19/15 with successful mid left SFA chronic total occlusion directional atherectomy followed by drug eluting balloon angioplasty using distal protection.  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 s/p chemo/xrt comp to 2006  . 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  Spinal cord imaged 11/2016  and no impingement.      Family History  Problem Relation Age of Onset  . Dementia Mother   . Heart disease Brother   . Cancer Brother        Lung CA  . Heart attack Brother   . Coronary artery disease Brother 62       deceased  . Cancer Brother        Unknown CA  . Colon cancer Neg Hx   . Stomach cancer Neg Hx    Social History:   reports that he has been smoking cigarettes. He started smoking about 50 years ago. He has a 45.00 pack-year smoking history. He has never used smokeless tobacco. He reports that he does not drink alcohol or use drugs.  Medications No current facility-administered medications for this encounter.   Current Outpatient Medications:  .   albuterol (PROVENTIL HFA;VENTOLIN HFA) 108 (90 Base) MCG/ACT inhaler, INHALE 2 PUFFS BY MOUTH EVERY 6 HOURS AS NEEDED FOR WHEEZING OR SHORTNESS OF BREATH, Disp: 3 Inhaler, Rfl: 3 .  atorvastatin (LIPITOR) 80 MG tablet, Take 1 tablet (80 mg total) by mouth daily at 6 PM., Disp: 90 tablet, Rfl: 3 .  clopidogrel (PLAVIX) 75 MG tablet, Take 1 tablet (75 mg total) by mouth daily., Disp: 90 tablet, Rfl: 3 .  Fluticasone-Umeclidin-Vilant (TRELEGY ELLIPTA) 100-62.5-25 MCG/INH AEPB, Inhale 1 puff into the lungs daily., Disp: 28 each, Rfl: 6 .  hydrochlorothiazide (HYDRODIURIL) 12.5 MG tablet, Take 1 tablet (12.5 mg total) by mouth daily., Disp: 90 tablet, Rfl: 3 .  ibuprofen (ADVIL) 600 MG tablet, Take 1 tablet (600 mg total) by mouth every 8 (eight) hours as needed., Disp: 30 tablet, Rfl: 0 .  omeprazole (PRILOSEC) 40 MG capsule, Take 1 capsule (40 mg total) by mouth daily., Disp: 90 capsule, Rfl: 3  Exam: Current vital signs: BP (!) 156/86 (BP Location: Left Arm)   Pulse (!) 113   Temp 98.3 F (36.8 C) (Oral)   Resp (!) 22   Ht 5\' 6"  (1.676 m)   Wt 75.3 kg   SpO2 94%   BMI 26.79 kg/m  Vital signs in last 24 hours: Temp:  [98.3 F (36.8 C)] 98.3 F (36.8 C) (06/18 1035) Pulse Rate:  [113] 113 (06/18 1035) Resp:  [22] 22 (06/18 1035) BP: (156)/(86) 156/86 (06/18 1035) SpO2:  [94 %] 94 % (06/18 1035) Weight:  [75.3 kg] 75.3 kg (06/18 1036) GENERAL: Awake, alert in NAD HEENT: - Normocephalic and atraumatic, dry mm, no LN++, no Thyromegally LUNGS -scattered rails CV - S1S2 RRR, no m/r/g, equal pulses bilaterally. ABDOMEN - Soft, nontender, nondistended with normoactive BS Ext: warm, well perfused, intact peripheral pulses, no edema NEURO:  Mental Status: AA&Ox3  Language: speech is dysarthric.  Naming, repetition, fluency, and comprehension intact. Cranial Nerves: PERRL EOMI, visual fields full, no facial asymmetry, facial sensation intact, hearing intact, tongue/uvula/soft palate midline,  normal sternocleidomastoid and trapezius muscle strength. No evidence of tongue atrophy or fibrillations Motor: Right upper extremity 4+/5 with drift, right lower extremity 4/5 with drift, left upper extremity although.  5/5 on individual testing but had vertical drift, left lower extremity 5/5 with no drift. Tone: is normal and bulk is normal Sensation-slight diminished on right. Coordination: FTN intact bilaterally Gait- deferred  NIHSS - 5   Labs I have reviewed labs in epic and the results pertinent to this consultation are:  CBC    Component Value Date/Time   WBC 11.2 (H) 11/14/2018 1009   RBC 4.56  11/14/2018 1009   HGB 15.6 11/14/2018 1014   HGB 15.8 08/06/2018 1653   HGB 12.5 (L) 06/23/2015 1152   HCT 46.0 11/14/2018 1014   HCT 45.0 08/06/2018 1653   HCT 38.1 (L) 06/23/2015 1152   PLT 200 11/14/2018 1009   PLT 290 08/06/2018 1653   MCV 95.2 11/14/2018 1009   MCV 92 08/06/2018 1653   MCV 92.7 06/23/2015 1152   MCH 32.0 11/14/2018 1009   MCHC 33.6 11/14/2018 1009   RDW 12.6 11/14/2018 1009   RDW 11.8 08/06/2018 1653   RDW 12.9 06/23/2015 1152   LYMPHSABS 0.5 (L) 11/14/2018 1009   LYMPHSABS 1.7 08/06/2018 1653   LYMPHSABS 1.3 06/23/2015 1152   MONOABS 0.7 11/14/2018 1009   MONOABS 0.4 06/23/2015 1152   EOSABS 0.0 11/14/2018 1009   EOSABS 0.1 08/06/2018 1653   BASOSABS 0.0 11/14/2018 1009   BASOSABS 0.0 08/06/2018 1653   BASOSABS 0.0 06/23/2015 1152    CMP     Component Value Date/Time   NA 134 (L) 11/14/2018 1014   NA 135 08/06/2018 1653   NA 138 06/23/2015 1152   K 4.0 11/14/2018 1014   K 4.1 06/23/2015 1152   CL 99 11/14/2018 1014   CL 103 06/18/2012 0837   CO2 23 08/06/2018 1653   CO2 26 06/23/2015 1152   GLUCOSE 152 (H) 11/14/2018 1014   GLUCOSE 106 06/23/2015 1152   GLUCOSE 103 (H) 06/18/2012 0837   BUN 17 11/14/2018 1014   BUN 16 08/06/2018 1653   BUN 19.6 06/23/2015 1152   CREATININE 0.90 11/14/2018 1014   CREATININE 1.06 08/21/2016 0831    CREATININE 1.0 06/23/2015 1152   CALCIUM 9.9 08/06/2018 1653   CALCIUM 9.1 06/23/2015 1152   PROT 7.1 08/06/2018 1653   PROT 7.8 06/23/2015 1152   ALBUMIN 4.5 08/06/2018 1653   ALBUMIN 3.7 06/23/2015 1152   AST 16 08/06/2018 1653   AST 35 (H) 06/23/2015 1152   ALT 24 08/06/2018 1653   ALT 30 07/14/2015 1114   ALT 38 06/23/2015 1152   ALKPHOS 126 (H) 08/06/2018 1653   ALKPHOS 122 06/23/2015 1152   BILITOT 0.4 08/06/2018 1653   BILITOT 0.42 06/23/2015 1152   GFRNONAA 77 08/06/2018 1653   GFRNONAA 75 08/21/2016 0831   GFRAA 89 08/06/2018 1653   GFRAA 87 08/21/2016 0831     Imaging I have reviewed the images obtained: CT-scan of the brain-no acute changes.  Aspects 10 CTA head and neck with diffuse intracranial atherosclerosis  Assessment:  63 year old man past history of small vessel stroke with residual right-sided weakness, presenting now with left leg weakness. On examination, I was unable to elicit left leg weakness but he did have drift in right upper, right lower and left upper extremity. His prior exams were compared to my exam today and did not seem to show much of a change. No tPA due to no objective new findings. No LVO on exam or CTA - so not a candidate for EVT  Impression: New acute stroke versus recrudescence of old symptoms  Recommendations: MRI brain without contrast-admit for stroke work-up only if it is positive for stroke. If the MRI is negative, can continue to receive outpatient follow-up and possible physical therapy. We will follow after imaging studies.    -- Amie Portland, MD [facility] Pager: [phone] If 7pm to 7am, please call on call as listed on AMION.   Addendum after MRI. MRI brain reviewed.  No acute changes. Stat MRI of the brain  negative He should be discharged home on aspirin 325 and Plavix 75 for 3 months, after which he should discontinue the aspirin. He should follow with outpatient neurology Continue  statin Please call us with questions  -- Amie Portland, MD [facility] Pager: [phone] If 7pm to 7am, please call on call as listed on AMION.

## 2018-11-14 NOTE — ED Notes (Signed)
Patient transported to MRI 

## 2018-11-14 NOTE — Code Documentation (Signed)
63yo male arriving to [facility] via [place] at 54. Patient from home where he lives with a roommate. He noted left leg weakness at 0930 and called EMS. EMS activated a code stroke for left leg weakness. Of note, patient with h/o acute left caudate to temporal stem and left pontine infarcts in 2019 with right sided weakness. Stroke team at the bedside on patient arrival. Labs drawn and patient cleared for CT by Dr. Venora Maples. Patient to CT with team. CT completed followed by CTA head and neck. NIHSS 5, see documentation for details and code stroke times. Patient with BUE drift, RLE drift, subjective decreased sensation in the LLE and moderate dysarthria on exam. No acute stroke treatment at this time. Patient to have MRI. Bedside handoff with ED RN Burman Nieves.

## 2018-11-14 NOTE — Discharge Instructions (Signed)
Take ASA daily and plavix daily for 3 months.   Take tylenol for pain   Take vicodin for severe pain   See neurologist and your primary care doctor for follow up   Use incentive spirometry 4 times daily   Return to ER if you have fever, worse cough, vomiting, weakness

## 2018-11-14 NOTE — Telephone Encounter (Signed)
I called the patient's sister.  Patient has had episodes of gait instability and falling, he has had some slurred speech.  He went to the emergency room today as a code stroke, MRI of the brain did not show any acute stroke event.  The patient will require further work-up, he has seen Dr. Erlinda Hong in the past, but otherwise no physician contact in our office, I will need to see him in a face-to-face visit within the next week or so.
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
## 2018-11-18 NOTE — Telephone Encounter (Signed)
Attempted to reach the pt about scheduling a f/u with Dr. Jannifer Franklin. Phone # on file connected to a vm that was not set up yet. Unable to leave a vm.

## 2018-11-18 NOTE — Telephone Encounter (Signed)
I spoke with the pt in regards to scheduling a two week f/u. Pt requested I call and speak with his sister [name]) ok per dpr.  I attempted to reach debbie and the # on file for [name] has been d/c.

## 2018-11-18 NOTE — Telephone Encounter (Signed)
I contacted the pt back and confirmed [name] # as [phone].  I spoke with the sister and she and I were able to scheduled appt for 11/19/18 at 4 pm check in time of 330 pm.   Check in process and covid 19 precautions reviewed.

## 2018-11-19 ENCOUNTER — Other Ambulatory Visit: Payer: Self-pay

## 2018-11-19 ENCOUNTER — Encounter: Payer: Self-pay | Admitting: Neurology

## 2018-11-19 ENCOUNTER — Ambulatory Visit (INDEPENDENT_AMBULATORY_CARE_PROVIDER_SITE_OTHER): Payer: Medicare HMO | Admitting: Neurology

## 2018-11-19 VITALS — BP 180/108 | HR 93 | Temp 98.4°F | Ht 67.0 in | Wt 166.0 lb

## 2018-11-19 DIAGNOSIS — R269 Unspecified abnormalities of gait and mobility: Secondary | ICD-10-CM

## 2018-11-19 DIAGNOSIS — N3941 Urge incontinence: Secondary | ICD-10-CM

## 2018-11-19 DIAGNOSIS — I679 Cerebrovascular disease, unspecified: Secondary | ICD-10-CM

## 2018-11-19 MED ORDER — CLOPIDOGREL BISULFATE 75 MG PO TABS: 75.0000 mg | ORAL_TABLET | Freq: Every day | ORAL | 3 refills | Status: DC
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
## 2018-11-28 ENCOUNTER — Ambulatory Visit: Payer: Medicare HMO | Admitting: Adult Health

## 2018-12-11 ENCOUNTER — Other Ambulatory Visit: Payer: Self-pay | Admitting: Cardiovascular Disease

## 2018-12-11 ENCOUNTER — Ambulatory Visit (HOSPITAL_COMMUNITY)
Admission: RE | Admit: 2018-12-11 | Discharge: 2018-12-11 | Disposition: A | Discharge status: Home / Self Care | Payer: Medicare HMO | Source: Ambulatory Visit | Attending: Cardiovascular Disease | Admitting: Cardiovascular Disease

## 2018-12-11 ENCOUNTER — Other Ambulatory Visit: Payer: Self-pay

## 2018-12-11 DIAGNOSIS — I739 Peripheral vascular disease, unspecified: Secondary | ICD-10-CM

## 2019-01-03 ENCOUNTER — Other Ambulatory Visit: Payer: Self-pay

## 2019-01-03 NOTE — Progress Notes (Signed)
Notes recorded by Lorretta Harp, MD on 12/12/2018 at 8:04 AM EDT  Mild progression of disease in left SFA since previous study. Repeat 12 months  Dx: Peripheral arterial disease

## 2019-01-13 ENCOUNTER — Ambulatory Visit (INDEPENDENT_AMBULATORY_CARE_PROVIDER_SITE_OTHER): Payer: Medicare HMO | Admitting: Family Medicine

## 2019-01-13 ENCOUNTER — Other Ambulatory Visit: Payer: Self-pay

## 2019-01-13 ENCOUNTER — Encounter: Payer: Self-pay | Admitting: Family Medicine

## 2019-01-13 VITALS — BP 136/88 | HR 65 | Wt 160.0 lb

## 2019-01-13 DIAGNOSIS — F1011 Alcohol abuse, in remission: Secondary | ICD-10-CM | POA: Diagnosis not present

## 2019-01-13 DIAGNOSIS — Z59819 Housing instability, housed unspecified: Secondary | ICD-10-CM

## 2019-01-13 DIAGNOSIS — Z599 Problem related to housing and economic circumstances, unspecified: Secondary | ICD-10-CM

## 2019-01-13 DIAGNOSIS — Z720 Tobacco use: Secondary | ICD-10-CM

## 2019-01-13 DIAGNOSIS — I6930 Unspecified sequelae of cerebral infarction: Secondary | ICD-10-CM | POA: Diagnosis not present

## 2019-01-13 DIAGNOSIS — R32 Unspecified urinary incontinence: Secondary | ICD-10-CM | POA: Diagnosis not present

## 2019-01-13 DIAGNOSIS — R399 Unspecified symptoms and signs involving the genitourinary system: Secondary | ICD-10-CM | POA: Diagnosis not present

## 2019-01-13 DIAGNOSIS — J449 Chronic obstructive pulmonary disease, unspecified: Secondary | ICD-10-CM | POA: Diagnosis not present

## 2019-01-13 LAB — POCT URINALYSIS DIP (MANUAL ENTRY)
Blood, UA: NEGATIVE
Glucose, UA: NEGATIVE mg/dL
Ketones, POC UA: NEGATIVE mg/dL
Leukocytes, UA: NEGATIVE
Nitrite, UA: NEGATIVE
Protein Ur, POC: NEGATIVE mg/dL
Spec Grav, UA: 1.025 (ref 1.010–1.025)
Urobilinogen, UA: 0.2 E.U./dL
pH, UA: 6 (ref 5.0–8.0)

## 2019-01-13 MED ORDER — OXYBUTYNIN CHLORIDE 5 MG PO TABS: 5.0000 mg | ORAL_TABLET | Freq: Every day | ORAL | 3 refills | Status: DC

## 2019-01-13 NOTE — Assessment & Plan Note (Signed)
Now in a group home.  Seems like a stable environnment.  And he needs help with incontinence especially because of the group home setting.

## 2019-01-13 NOTE — Assessment & Plan Note (Signed)
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

## 2019-01-13 NOTE — Assessment & Plan Note (Signed)
Still using and only motivation to quit is money.  Was not willing to commit to a quit date.

## 2019-01-13 NOTE — Assessment & Plan Note (Signed)
Slow ambulation makes his urge incontinence difficult to manage.

## 2019-01-13 NOTE — Assessment & Plan Note (Signed)
Will use ditropan at night and behavioral interventions during the day.

## 2019-01-13 NOTE — Assessment & Plan Note (Signed)
Only using his trelegy prn.  Urged him to use regularly.
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
## 2019-01-13 NOTE — Patient Instructions (Addendum)
Please review your medicine list.  It is important that you take all your medications to prevent future strokes. You should use you Trelegy inhaler every day - even on good days.  It helps your breathing stay good.  Try to get into a normal bowel movement routine.  Try to poop at the same time every day.   Please plan on going to the bathroom to empty your bladder every two hours while awake.  That way, you won't be surprised and have to run to the bathroom.   If you wake up in the middle of the night, go empty your bladder, even if you feel like you don't need to. I did send in a new medication to help you stay dry at night.

## 2019-01-16 ENCOUNTER — Telehealth: Payer: Self-pay | Admitting: *Deleted

## 2019-01-16 NOTE — Telephone Encounter (Signed)
Lattie Haw, RN for [facility] calls to report that pt does not seem to be taking his omeprazole, trelegy or atorvastatin because his last refill was in 07/2018.   She also advised him to pick up a BP cuff but pt "did not seem interested".  Christen Bame, CMA

## 2019-01-16 NOTE — Telephone Encounter (Signed)
Noted.  I am not surprised.He also continues to smoke.

## 2019-01-17 ENCOUNTER — Telehealth: Payer: Self-pay

## 2019-01-17 DIAGNOSIS — R32 Unspecified urinary incontinence: Secondary | ICD-10-CM

## 2019-01-17 NOTE — Telephone Encounter (Signed)
Patient calls nurse line stating oxybutynin does not seem to be helping. Patient stated he is taking one tablet at night as prescribed. Patient would like to know if he can increase his dose or be switched to something else. Of note, it was hard to understand him clearly. Will forward to PCP.

## 2019-01-20 MED ORDER — OXYBUTYNIN CHLORIDE 5 MG PO TABS: 10.0000 mg | ORAL_TABLET | Freq: Every day | ORAL | 3 refills | Status: DC

## 2019-01-20 NOTE — Telephone Encounter (Signed)
Called patient.  Ok to try 10 mg (2 pills) qhs.  If that works, he will call back for a prescription of the higher dose.  If it does not work, he will call back for a new medication.

## 2019-01-24 ENCOUNTER — Telehealth: Payer: Self-pay | Admitting: Family Medicine

## 2019-01-24 DIAGNOSIS — R32 Unspecified urinary incontinence: Secondary | ICD-10-CM

## 2019-01-24 NOTE — Telephone Encounter (Signed)
The patients medication he was given for urination is not working at all.  It is not helping and he was told to call and let us know.  If you have any questions for the patient, please call [phone].

## 2019-01-28 MED ORDER — TAMSULOSIN HCL 0.4 MG PO CAPS: 0.4000 mg | ORAL_CAPSULE | Freq: Every day | ORAL | 3 refills | Status: DC

## 2019-01-28 NOTE — Telephone Encounter (Signed)
Discussed by phone.  See changes under urinary incontinence.

## 2019-01-28 NOTE — Assessment & Plan Note (Signed)
Nocturia not helped by oxybutinin.  Now complains of the sensation of not fully emptying his bladder.  DC oxybutinin and start flomax

## 2019-02-24 ENCOUNTER — Encounter: Payer: Self-pay | Admitting: Adult Health

## 2019-02-24 ENCOUNTER — Ambulatory Visit: Payer: Medicare HMO | Admitting: Adult Health

## 2019-03-28 ENCOUNTER — Other Ambulatory Visit: Payer: Self-pay

## 2019-03-28 ENCOUNTER — Other Ambulatory Visit: Payer: Self-pay | Admitting: Family Medicine

## 2019-03-28 DIAGNOSIS — I2510 Atherosclerotic heart disease of native coronary artery without angina pectoris: Secondary | ICD-10-CM

## 2019-03-28 DIAGNOSIS — I6930 Unspecified sequelae of cerebral infarction: Secondary | ICD-10-CM

## 2019-03-28 DIAGNOSIS — R32 Unspecified urinary incontinence: Secondary | ICD-10-CM

## 2019-03-28 DIAGNOSIS — I679 Cerebrovascular disease, unspecified: Secondary | ICD-10-CM

## 2019-03-28 MED ORDER — CLOPIDOGREL BISULFATE 75 MG PO TABS: 75.0000 mg | ORAL_TABLET | Freq: Every day | ORAL | 3 refills | Status: DC

## 2019-03-31 MED ORDER — TAMSULOSIN HCL 0.4 MG PO CAPS: 0.4000 mg | ORAL_CAPSULE | Freq: Every day | ORAL | 3 refills | Status: DC

## 2019-04-03 ENCOUNTER — Telehealth: Payer: Self-pay | Admitting: *Deleted

## 2019-04-03 NOTE — Telephone Encounter (Signed)
Contacted pt to be sure he knew that his flomax Rx was at the [facility] on [address].  Received fax from [facility] for a refill.  Pt said [facility] was fine.Allia Wiltsey Zimmerman Rumple, CMA

## 2019-06-09 ENCOUNTER — Telehealth: Payer: Self-pay

## 2019-06-09 NOTE — Telephone Encounter (Signed)
Called pt per NP Frann Rider will be ooo all this week. JM is working from home and would like her pts converted to [place] visits.   Pt preferred a in person visit and was r/s feb 15 @ 7:45

## 2019-06-10 ENCOUNTER — Ambulatory Visit: Payer: Medicare HMO | Admitting: Adult Health

## 2019-06-25 ENCOUNTER — Telehealth (INDEPENDENT_AMBULATORY_CARE_PROVIDER_SITE_OTHER): Payer: Medicare HMO | Admitting: Family Medicine

## 2019-06-25 ENCOUNTER — Other Ambulatory Visit: Payer: Self-pay

## 2019-06-25 DIAGNOSIS — L299 Pruritus, unspecified: Secondary | ICD-10-CM | POA: Diagnosis not present

## 2019-06-25 MED ORDER — CETIRIZINE HCL 10 MG PO TABS: 10.0000 mg | ORAL_TABLET | Freq: Every day | ORAL | 11 refills | Status: DC

## 2019-06-26 NOTE — Progress Notes (Signed)
No video capability.  We did phone visit.  C/O pruritis "all over"  Especially on arms and abd but does extend to trunk.  He has had bed bugs previously.  "I know it is not bed bugs again.  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  Has his own bedroom.  No rash or bites.  No new soaps or detergents.  Has not used anything topical or by mouth.    Most likely dx is dry skin from winter lack of humidity.  Liver or renal disease is possible.  Tried non specific interventions (zyrtec and eucerin).  Will need to be seen if these fail to help.    Duration of phone call=15 minutes.

## 2019-06-26 NOTE — Assessment & Plan Note (Signed)
Zyrtec and eucerin.  To be seen if continues to be symptomatic.

## 2019-07-14 ENCOUNTER — Ambulatory Visit: Payer: Medicare HMO | Admitting: Adult Health

## 2019-09-23 ENCOUNTER — Other Ambulatory Visit: Payer: Self-pay

## 2019-09-23 ENCOUNTER — Encounter: Payer: Self-pay | Admitting: Adult Health

## 2019-09-23 ENCOUNTER — Ambulatory Visit (INDEPENDENT_AMBULATORY_CARE_PROVIDER_SITE_OTHER): Payer: Medicare HMO | Admitting: Adult Health

## 2019-09-23 VITALS — BP 119/72 | HR 97 | Ht 66.5 in | Wt 160.0 lb

## 2019-09-23 DIAGNOSIS — I6502 Occlusion and stenosis of left vertebral artery: Secondary | ICD-10-CM | POA: Diagnosis not present

## 2019-09-23 DIAGNOSIS — E785 Hyperlipidemia, unspecified: Secondary | ICD-10-CM | POA: Diagnosis not present

## 2019-09-23 DIAGNOSIS — Z8673 Personal history of transient ischemic attack (TIA), and cerebral infarction without residual deficits: Secondary | ICD-10-CM | POA: Diagnosis not present

## 2019-09-23 DIAGNOSIS — I10 Essential (primary) hypertension: Secondary | ICD-10-CM | POA: Diagnosis not present

## 2019-09-23 DIAGNOSIS — R269 Unspecified abnormalities of gait and mobility: Secondary | ICD-10-CM | POA: Diagnosis not present
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
## 2019-09-23 NOTE — Patient Instructions (Addendum)
Referral placed to PT for ongoing walking difficulty -we should be called to schedule initial evaluation but if you are not called by the beginning of next week, please call their office to schedule initial visit  Continue clopidogrel 75 mg daily  and atorvastatin for secondary stroke prevention  Continue to follow up with PCP regarding cholesterol and blood pressure management   Repeat carotid ultrasound for surveillance monitoring of vertebral artery stenosis  Maintain strict control of hypertension with blood pressure goal below 130/90, diabetes with hemoglobin A1c goal below 6.5% and cholesterol with LDL cholesterol (bad cholesterol) goal below 70 mg/dL. I also advised the patient to eat a healthy diet with plenty of whole grains, cereals, fruits and vegetables, exercise regularly and maintain ideal body weight.  Followup in the future with me in 6 months or call earlier if needed       Thank you for coming to see [place] at [facility]. I hope we have been able to provide you high quality care today.  You may receive a patient satisfaction survey over the next few weeks. We would appreciate your feedback and comments so that we may continue to improve ourselves and the health of our patients.

## 2019-09-26 ENCOUNTER — Other Ambulatory Visit: Payer: Self-pay | Admitting: Family Medicine

## 2019-09-26 DIAGNOSIS — I10 Essential (primary) hypertension: Secondary | ICD-10-CM

## 2019-09-26 DIAGNOSIS — K219 Gastro-esophageal reflux disease without esophagitis: Secondary | ICD-10-CM

## 2019-09-26 DIAGNOSIS — I2510 Atherosclerotic heart disease of native coronary artery without angina pectoris: Secondary | ICD-10-CM

## 2019-09-29 NOTE — Progress Notes (Signed)
I agree with the above plan 

## 2019-09-30 ENCOUNTER — Ambulatory Visit (HOSPITAL_COMMUNITY): Payer: Medicare HMO

## 2019-10-01 ENCOUNTER — Ambulatory Visit (HOSPITAL_COMMUNITY): Admission: RE | Admit: 2019-10-01 | Payer: Medicare HMO | Source: Ambulatory Visit

## 2019-10-06 ENCOUNTER — Ambulatory Visit (HOSPITAL_COMMUNITY): Admission: RE | Admit: 2019-10-06 | Payer: Medicare HMO | Source: Ambulatory Visit

## 2019-10-06 ENCOUNTER — Other Ambulatory Visit: Payer: Self-pay | Admitting: *Deleted

## 2019-10-06 DIAGNOSIS — J441 Chronic obstructive pulmonary disease with (acute) exacerbation: Secondary | ICD-10-CM

## 2019-10-06 MED ORDER — ALBUTEROL SULFATE HFA 108 (90 BASE) MCG/ACT IN AERS: INHALATION_SPRAY | RESPIRATORY_TRACT | 12 refills | Status: DC

## 2019-12-11 ENCOUNTER — Emergency Department (HOSPITAL_COMMUNITY)
Admission: EM | Admit: 2019-12-11 | Discharge: 2019-12-12 | Disposition: A | Discharge status: Home / Self Care | Payer: Medicare HMO | Attending: Emergency Medicine | Admitting: Emergency Medicine

## 2019-12-11 ENCOUNTER — Other Ambulatory Visit: Payer: Self-pay

## 2019-12-11 ENCOUNTER — Encounter (HOSPITAL_COMMUNITY): Payer: Self-pay | Admitting: Emergency Medicine

## 2019-12-11 DIAGNOSIS — M545 Low back pain, unspecified: Secondary | ICD-10-CM

## 2019-12-11 DIAGNOSIS — M5441 Lumbago with sciatica, right side: Secondary | ICD-10-CM | POA: Diagnosis not present

## 2019-12-11 DIAGNOSIS — R05 Cough: Secondary | ICD-10-CM | POA: Diagnosis not present

## 2019-12-11 DIAGNOSIS — I2510 Atherosclerotic heart disease of native coronary artery without angina pectoris: Secondary | ICD-10-CM | POA: Diagnosis not present

## 2019-12-11 DIAGNOSIS — R531 Weakness: Secondary | ICD-10-CM | POA: Diagnosis not present

## 2019-12-11 DIAGNOSIS — M5489 Other dorsalgia: Secondary | ICD-10-CM | POA: Diagnosis not present

## 2019-12-11 DIAGNOSIS — Z85118 Personal history of other malignant neoplasm of bronchus and lung: Secondary | ICD-10-CM | POA: Diagnosis not present

## 2019-12-11 DIAGNOSIS — Z8673 Personal history of transient ischemic attack (TIA), and cerebral infarction without residual deficits: Secondary | ICD-10-CM | POA: Diagnosis not present

## 2019-12-11 DIAGNOSIS — Z79899 Other long term (current) drug therapy: Secondary | ICD-10-CM | POA: Insufficient documentation

## 2019-12-11 DIAGNOSIS — G319 Degenerative disease of nervous system, unspecified: Secondary | ICD-10-CM | POA: Diagnosis not present

## 2019-12-11 DIAGNOSIS — R296 Repeated falls: Secondary | ICD-10-CM | POA: Diagnosis not present

## 2019-12-11 DIAGNOSIS — F17210 Nicotine dependence, cigarettes, uncomplicated: Secondary | ICD-10-CM | POA: Diagnosis not present

## 2019-12-11 DIAGNOSIS — R159 Full incontinence of feces: Secondary | ICD-10-CM | POA: Insufficient documentation

## 2019-12-11 DIAGNOSIS — J449 Chronic obstructive pulmonary disease, unspecified: Secondary | ICD-10-CM | POA: Insufficient documentation

## 2019-12-11 DIAGNOSIS — I6782 Cerebral ischemia: Secondary | ICD-10-CM | POA: Diagnosis not present

## 2019-12-11 DIAGNOSIS — R32 Unspecified urinary incontinence: Secondary | ICD-10-CM | POA: Diagnosis not present

## 2019-12-11 DIAGNOSIS — R52 Pain, unspecified: Secondary | ICD-10-CM | POA: Diagnosis not present

## 2019-12-11 DIAGNOSIS — W19XXXA Unspecified fall, initial encounter: Secondary | ICD-10-CM | POA: Diagnosis not present

## 2019-12-11 DIAGNOSIS — I6389 Other cerebral infarction: Secondary | ICD-10-CM | POA: Diagnosis not present

## 2019-12-11 NOTE — ED Triage Notes (Signed)
Patient with multiple falls on Monday and Tuesday.  Having pain that started today.  Patient with pain across his lower back.  Patient does have history of CVA.  Patient is able to move himself to chair to stretcher and back to chair with pain.

## 2019-12-12 ENCOUNTER — Emergency Department (HOSPITAL_COMMUNITY): Payer: Medicare HMO

## 2019-12-12 ENCOUNTER — Ambulatory Visit: Payer: Medicare HMO

## 2019-12-12 DIAGNOSIS — R05 Cough: Secondary | ICD-10-CM | POA: Diagnosis not present

## 2019-12-12 DIAGNOSIS — I6389 Other cerebral infarction: Secondary | ICD-10-CM | POA: Diagnosis not present

## 2019-12-12 DIAGNOSIS — G319 Degenerative disease of nervous system, unspecified: Secondary | ICD-10-CM | POA: Diagnosis not present

## 2019-12-12 DIAGNOSIS — M545 Low back pain: Secondary | ICD-10-CM | POA: Diagnosis not present

## 2019-12-12 DIAGNOSIS — I6782 Cerebral ischemia: Secondary | ICD-10-CM | POA: Diagnosis not present

## 2019-12-12 DIAGNOSIS — R296 Repeated falls: Secondary | ICD-10-CM | POA: Diagnosis not present

## 2019-12-12 LAB — CBC WITH DIFFERENTIAL/PLATELET
Abs Immature Granulocytes: 0.02 10*3/uL (ref 0.00–0.07)
Basophils Absolute: 0 10*3/uL (ref 0.0–0.1)
Basophils Relative: 0 %
Eosinophils Absolute: 0.1 10*3/uL (ref 0.0–0.5)
Eosinophils Relative: 2 %
HCT: 42.2 % (ref 39.0–52.0)
Hemoglobin: 13.9 g/dL (ref 13.0–17.0)
Immature Granulocytes: 0 %
Lymphocytes Relative: 31 %
Lymphs Abs: 1.8 10*3/uL (ref 0.7–4.0)
MCH: 31.4 pg (ref 26.0–34.0)
MCHC: 32.9 g/dL (ref 30.0–36.0)
MCV: 95.3 fL (ref 80.0–100.0)
Monocytes Absolute: 0.6 10*3/uL (ref 0.1–1.0)
Monocytes Relative: 10 %
Neutro Abs: 3.3 10*3/uL (ref 1.7–7.7)
Neutrophils Relative %: 57 %
Platelets: 217 10*3/uL (ref 150–400)
RBC: 4.43 MIL/uL (ref 4.22–5.81)
RDW: 13.1 % (ref 11.5–15.5)
WBC: 5.7 10*3/uL (ref 4.0–10.5)
nRBC: 0 % (ref 0.0–0.2)

## 2019-12-12 LAB — COMPREHENSIVE METABOLIC PANEL
ALT: 22 U/L (ref 0–44)
AST: 18 U/L (ref 15–41)
Albumin: 3.8 g/dL (ref 3.5–5.0)
Alkaline Phosphatase: 67 U/L (ref 38–126)
Anion gap: 9 (ref 5–15)
BUN: 23 mg/dL (ref 8–23)
CO2: 28 mmol/L (ref 22–32)
Calcium: 9.3 mg/dL (ref 8.9–10.3)
Chloride: 102 mmol/L (ref 98–111)
Creatinine, Ser: 1.15 mg/dL (ref 0.61–1.24)
GFR calc Af Amer: >60 mL/min (ref >60)
GFR calc non Af Amer: >60 mL/min (ref >60)
Glucose, Bld: 97 mg/dL (ref 70–99)
Potassium: 4.9 mmol/L (ref 3.5–5.1)
Sodium: 139 mmol/L (ref 135–145)
Total Bilirubin: 1 mg/dL (ref 0.3–1.2)
Total Protein: 6.5 g/dL (ref 6.5–8.1)

## 2019-12-12 LAB — PROTIME-INR
INR: 0.9 (ref 0.8–1.2)
Prothrombin Time: 12 seconds (ref 11.4–15.2)

## 2019-12-12 MED ORDER — KETOROLAC TROMETHAMINE 30 MG/ML IJ SOLN
15.0000 mg | Freq: Once | INTRAMUSCULAR | Status: AC
  Administered 2019-12-12: 15 mg via INTRAVENOUS
  Filled 2019-12-12: qty 1

## 2019-12-12 MED ORDER — OXYCODONE-ACETAMINOPHEN 5-325 MG PO TABS: 1.0000 | ORAL_TABLET | ORAL | 0 refills | Status: DC | PRN

## 2019-12-12 MED ORDER — MORPHINE SULFATE (PF) 4 MG/ML IV SOLN
4.0000 mg | Freq: Once | INTRAVENOUS | Status: AC
  Administered 2019-12-12: 4 mg via INTRAVENOUS
  Filled 2019-12-12: qty 1

## 2019-12-12 NOTE — ED Notes (Signed)
Pt d/c home per MD order. Discharge summary reviewed with pt, pt verbalizes understanding. Off unit via [place]- reports taking a cab home.

## 2019-12-12 NOTE — Discharge Instructions (Signed)
Rest the back. Use warm compresses for additional relief. Take Percocet as directed for pain. Use extra caution while walking and taking a narcotic pain reliever.   Follow up with your doctor for recheck in 3-4 days. Return to the emergency department with any new or worsening symptoms.
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
## 2019-12-15 ENCOUNTER — Ambulatory Visit (HOSPITAL_COMMUNITY)
Admission: RE | Admit: 2019-12-15 | Payer: Medicare HMO | Source: Ambulatory Visit | Attending: Adult Health | Admitting: Adult Health

## 2019-12-15 ENCOUNTER — Ambulatory Visit (HOSPITAL_COMMUNITY)
Admission: RE | Admit: 2019-12-15 | Payer: Medicare HMO | Source: Ambulatory Visit | Attending: Cardiovascular Disease | Admitting: Cardiovascular Disease

## 2019-12-17 ENCOUNTER — Ambulatory Visit (INDEPENDENT_AMBULATORY_CARE_PROVIDER_SITE_OTHER): Payer: Medicare HMO | Admitting: Family Medicine

## 2019-12-17 ENCOUNTER — Other Ambulatory Visit: Payer: Self-pay

## 2019-12-17 VITALS — BP 140/88 | HR 98 | Ht 67.0 in | Wt 155.2 lb

## 2019-12-17 DIAGNOSIS — Z720 Tobacco use: Secondary | ICD-10-CM

## 2019-12-17 DIAGNOSIS — F17200 Nicotine dependence, unspecified, uncomplicated: Secondary | ICD-10-CM | POA: Diagnosis not present

## 2019-12-17 DIAGNOSIS — M545 Low back pain, unspecified: Secondary | ICD-10-CM

## 2019-12-17 MED ORDER — DICLOFENAC SODIUM 1 % EX GEL: 4.0000 g | Freq: Four times a day (QID) | CUTANEOUS | 1 refills | Status: DC

## 2019-12-17 MED ORDER — TRAMADOL HCL 50 MG PO TABS: 50.0000 mg | ORAL_TABLET | Freq: Two times a day (BID) | ORAL | 0 refills | Status: AC

## 2019-12-17 NOTE — Progress Notes (Signed)
    SUBJECTIVE:   CHIEF COMPLAINT / HPI:   Severe lower back pain: patient has 10 day history of severe lower back pain that radiates to both sides of his lower back. He cannot think of anything that instigated his pain. Does not do heavy lifting. Describes it as a pins and needles but does not radiate down the legs. He went to the ED earlier this week for this pain and had an xray performed which was 'negative' and he was discharged from the ED.  Patient has had a stroke a few years ago and has had residual right sided weakness since that time.  He has had months of urinary incontinence during both day and night.  He sometimes does not know he needs to urinate prior to doing so.  He also has instances when he has fecal urgency and can't make it to the bathroom in time. He denies saddle anesthesia.  The patient had a hx of small cell lung cancer in 1996 and was subsequently treated and has not had any recurrences.  He still smokes.  He currently smokes one pack a day.  He wants to quit.  He previously used IV drugs but has denied any use in the past 14 years.  Patient uses a cane to get around.    PERTINENT  PMH / PSH: CVA, PAD, COPD, small cell lung cancer  OBJECTIVE:   BP 140/88   Pulse 98   Ht 5\' 7"  (1.702 m)   Wt 155 lb 3.2 oz (70.4 kg)   SpO2 95%   BMI 24.31 kg/m   Gen: alert. Has odor of cigarette smoke.  Appears uncomfortable when sitting.   CV: RRR. No murmurs Pulm: wheezing heard on both sides.  Left side is mild expiratory wheezes and right is slightly more pronounced.   MSK: midline TTP near T4-5 as well as L5-S1.  TTP bilaterally in the sacral region.  Posterior ipsilateral sacral pain with passive flexion of legs that does not radiate down the legs.  4/5 strength with hip flexion and leg extension bilaterally.  Large amplitude tremor with exertion of leg muscles during strength test. Pt takes several seconds to rise from seated position  ASSESSMENT/PLAN:   Acute bilateral low  back pain without sciatica 10 days of acute severe low back pain which makes it difficult to ambulate. Recent xray was negative.  Physical exam showed severe tenderness with limited mobility but no sciatic symptoms.  He has several red flags symptoms/signs concerning for malignancy or possibly infection including hx of lung cancer, current smoker, previous IV drug abuse, and urinary incontinence.  Will order MRI of lumbar spine, give diclofenac gel, and short course of tramadol.  Will get ct lung screen to assess for lung cancer.   Tobacco abuse Current smoker.  States he is one pack a day but significant nicotine staining of his fingers and nails and smell of cigarette smoke would indicate more use than 1 ppd.  Desires to quit.  Gave pt information on 1800 quit now.  Will get ct lung cancer screen.      Benay Pike, MD [place]

## 2019-12-17 NOTE — Patient Instructions (Signed)
It was nice to meet you today,  I will order a an MRI of your spine.  We will call this to get this approved and then let you know when your appointment is.  I will also get a CT screen for lung cancer given that you have a history of lung cancer and are currently smoking.  For your pain you can use Tylenol and topical treatments such as lidocaine patches which are over-the-counter.  I also prescribed Voltaren gel for you.  I would like to see you back in 2 weeks.  Have a great day,  Clemetine Marker, MD

## 2019-12-19 ENCOUNTER — Encounter (HOSPITAL_COMMUNITY): Payer: Medicare HMO

## 2019-12-20 NOTE — Assessment & Plan Note (Signed)
Current smoker.  States he is one pack a day but significant nicotine staining of his fingers and nails and smell of cigarette smoke would indicate more use than 1 ppd.  Desires to quit.  Gave pt information on 1800 quit now.  Will get ct lung cancer screen.

## 2019-12-20 NOTE — Assessment & Plan Note (Signed)
10 days of acute severe low back pain which makes it difficult to ambulate. Recent xray was negative.  Physical exam showed severe tenderness with limited mobility but no sciatic symptoms.  He has several red flags symptoms/signs concerning for malignancy or possibly infection including hx of lung cancer, current smoker, previous IV drug abuse, and urinary incontinence.  Will order MRI of lumbar spine, give diclofenac gel, and short course of tramadol.  Will get ct lung screen to assess for lung cancer.

## 2019-12-25 ENCOUNTER — Telehealth: Payer: Self-pay | Admitting: *Deleted

## 2019-12-25 NOTE — Telephone Encounter (Signed)
Contacted pt to inform him of his CT and MRI appointment.  They are on 01/09/20 at [facility] 1st Floor Radiology.  He is to arrive at 3:15 for his 3:30 CT and MRI is scheduled same day at 5:00pm. Pt did not have pen to write this information and requested it to be mailed to him.Rocsi Hazelbaker Zimmerman Rumple, CMA

## 2019-12-30 ENCOUNTER — Encounter: Payer: Self-pay | Admitting: Family Medicine

## 2019-12-30 NOTE — Progress Notes (Signed)
Received prior auth form to complete for MRI.  Tried calling patient x 2.  No answer and no voice mail.  He has an appointment with me tomorrow and I will address then.

## 2019-12-31 ENCOUNTER — Ambulatory Visit: Payer: Medicare HMO | Admitting: Family Medicine

## 2020-01-09 ENCOUNTER — Encounter (HOSPITAL_COMMUNITY): Payer: Medicare HMO

## 2020-01-09 ENCOUNTER — Ambulatory Visit (HOSPITAL_COMMUNITY)
Admission: RE | Admit: 2020-01-09 | Discharge: 2020-01-09 | Disposition: A | Discharge status: Home / Self Care | Payer: Medicare HMO | Source: Ambulatory Visit | Attending: Family Medicine | Admitting: Family Medicine

## 2020-01-09 ENCOUNTER — Other Ambulatory Visit: Payer: Self-pay

## 2020-01-09 ENCOUNTER — Encounter (HOSPITAL_COMMUNITY): Payer: Self-pay

## 2020-01-09 DIAGNOSIS — F17200 Nicotine dependence, unspecified, uncomplicated: Secondary | ICD-10-CM

## 2020-01-09 DIAGNOSIS — M47816 Spondylosis without myelopathy or radiculopathy, lumbar region: Secondary | ICD-10-CM | POA: Diagnosis not present

## 2020-01-09 DIAGNOSIS — M5127 Other intervertebral disc displacement, lumbosacral region: Secondary | ICD-10-CM | POA: Diagnosis not present

## 2020-01-09 DIAGNOSIS — M48061 Spinal stenosis, lumbar region without neurogenic claudication: Secondary | ICD-10-CM | POA: Diagnosis not present

## 2020-01-09 DIAGNOSIS — M545 Low back pain, unspecified: Secondary | ICD-10-CM

## 2020-01-09 DIAGNOSIS — M5126 Other intervertebral disc displacement, lumbar region: Secondary | ICD-10-CM | POA: Diagnosis not present

## 2020-01-09 MED ORDER — GADOBUTROL 1 MMOL/ML IV SOLN
7.0000 mL | Freq: Once | INTRAVENOUS | Status: AC | PRN
  Administered 2020-01-09: 7 mL via INTRAVENOUS

## 2020-01-13 ENCOUNTER — Ambulatory Visit (HOSPITAL_COMMUNITY)
Admission: RE | Admit: 2020-01-13 | Payer: Medicare HMO | Source: Ambulatory Visit | Attending: Cardiovascular Disease | Admitting: Cardiovascular Disease

## 2020-01-13 ENCOUNTER — Ambulatory Visit (HOSPITAL_COMMUNITY)
Admission: RE | Admit: 2020-01-13 | Payer: Medicare HMO | Source: Ambulatory Visit | Attending: Adult Health | Admitting: Adult Health

## 2020-01-15 ENCOUNTER — Telehealth: Payer: Self-pay | Admitting: Family Medicine

## 2020-01-16 NOTE — Telephone Encounter (Signed)
Attempted to call pt regarding results of his MRI, but pt did not answer and voicemail had not been set up.    MRI shows mild lumbar spondylosis but otherwise normal.  If pt calls back, please ask him if his symptoms have been improving.  Does not appear he has been back to clinic since our encounter in July.

## 2020-02-25 ENCOUNTER — Other Ambulatory Visit: Payer: Self-pay | Admitting: Neurology

## 2020-02-25 DIAGNOSIS — I6930 Unspecified sequelae of cerebral infarction: Secondary | ICD-10-CM

## 2020-02-25 DIAGNOSIS — I679 Cerebrovascular disease, unspecified: Secondary | ICD-10-CM

## 2020-02-25 DIAGNOSIS — I2510 Atherosclerotic heart disease of native coronary artery without angina pectoris: Secondary | ICD-10-CM

## 2020-03-23 ENCOUNTER — Ambulatory Visit (INDEPENDENT_AMBULATORY_CARE_PROVIDER_SITE_OTHER): Payer: Medicare HMO | Admitting: Adult Health

## 2020-03-23 ENCOUNTER — Other Ambulatory Visit: Payer: Self-pay

## 2020-03-23 ENCOUNTER — Encounter: Payer: Self-pay | Admitting: Adult Health

## 2020-03-23 VITALS — BP 147/88 | HR 101 | Ht 66.0 in | Wt 149.0 lb

## 2020-03-23 DIAGNOSIS — Z8673 Personal history of transient ischemic attack (TIA), and cerebral infarction without residual deficits: Secondary | ICD-10-CM | POA: Diagnosis not present

## 2020-03-23 DIAGNOSIS — I10 Essential (primary) hypertension: Secondary | ICD-10-CM | POA: Diagnosis not present

## 2020-03-23 DIAGNOSIS — E785 Hyperlipidemia, unspecified: Secondary | ICD-10-CM

## 2020-03-23 DIAGNOSIS — R269 Unspecified abnormalities of gait and mobility: Secondary | ICD-10-CM

## 2020-03-23 NOTE — Patient Instructions (Signed)
Continue clopidogrel 75 mg daily  and atorvastatin  for secondary stroke prevention  Referral placed for physical therapy - please ensure you schedule visit to help prevent worsening of your ambulation and walking ability  Please ensure you schedule a follow up visit with your PCP to further discuss incontinence concerns and to repeat lab work  Continue to follow up with PCP regarding cholesterol and blood pressure management  Maintain strict control of hypertension with blood pressure goal below 130/90 and cholesterol with LDL cholesterol (bad cholesterol) goal below 70 mg/dL.    Overall stable from stroke standpoint and recommend follow up on a as needed basis     Thank you for coming to see [place] at [facility]. I hope we have been able to provide you high quality care today.  You may receive a patient satisfaction survey over the next few weeks. We would appreciate your feedback and comments so that we may continue to improve ourselves and the health of our patients.
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
## 2020-03-24 NOTE — Progress Notes (Signed)
I agree with the above plan 

## 2020-04-07 ENCOUNTER — Other Ambulatory Visit: Payer: Self-pay

## 2020-04-07 ENCOUNTER — Ambulatory Visit (INDEPENDENT_AMBULATORY_CARE_PROVIDER_SITE_OTHER): Payer: Medicare HMO | Admitting: Family Medicine

## 2020-04-07 DIAGNOSIS — G4700 Insomnia, unspecified: Secondary | ICD-10-CM | POA: Diagnosis not present

## 2020-04-07 DIAGNOSIS — R32 Unspecified urinary incontinence: Secondary | ICD-10-CM | POA: Diagnosis not present

## 2020-04-07 DIAGNOSIS — R159 Full incontinence of feces: Secondary | ICD-10-CM

## 2020-04-07 DIAGNOSIS — I6930 Unspecified sequelae of cerebral infarction: Secondary | ICD-10-CM | POA: Diagnosis not present

## 2020-04-07 DIAGNOSIS — Z720 Tobacco use: Secondary | ICD-10-CM | POA: Diagnosis not present

## 2020-04-07 DIAGNOSIS — F4323 Adjustment disorder with mixed anxiety and depressed mood: Secondary | ICD-10-CM

## 2020-04-07 MED ORDER — MIRTAZAPINE 15 MG PO TABS: 15.0000 mg | ORAL_TABLET | Freq: Every day | ORAL | 2 refills | Status: DC

## 2020-04-07 NOTE — Patient Instructions (Signed)
I sent a new medicine for sleep to the drug store.  See me in one month.   Say hi to [facility] for me.

## 2020-04-08 ENCOUNTER — Encounter: Payer: Self-pay | Admitting: Family Medicine

## 2020-04-08 DIAGNOSIS — R159 Full incontinence of feces: Secondary | ICD-10-CM | POA: Insufficient documentation

## 2020-04-08 DIAGNOSIS — F4323 Adjustment disorder with mixed anxiety and depressed mood: Secondary | ICD-10-CM | POA: Insufficient documentation

## 2020-04-08 NOTE — Assessment & Plan Note (Signed)
Trial of mirtazipine to help with depression, sleep and appetite.

## 2020-04-08 NOTE — Assessment & Plan Note (Signed)
Old problem  Still difficult for me to characterize.  No changes at this time.

## 2020-04-08 NOTE — Assessment & Plan Note (Signed)
Treat with mirtazipine which hopefully will improve sleep, improve depression and improve appetite.

## 2020-04-08 NOTE — Assessment & Plan Note (Signed)
No interest in quitting.  It is one of the few pleasures he still gets out of life.

## 2020-04-08 NOTE — Assessment & Plan Note (Signed)
Suspect slow gait is contributing to his incontience issues.

## 2020-04-08 NOTE — Assessment & Plan Note (Signed)
Does not sound like true incontinence.  Sounds more like mild loss of integrity of the sphincter.

## 2020-04-08 NOTE — Progress Notes (Signed)
    SUBJECTIVE:   CHIEF COMPLAINT / HPI:   Multiple concerns; 1. Urinary incontinence. Especially in the mornings.  I wake up with bed wet.  Also wears pullup during the day.  Does not endorse urinary flow problem or urgency. 2. Fecal incontinence.  Similar to above.  States he has normal BMs mostly but will have loose BM and not have control.   For both problems above: Does have chronic back pain.  Denies saddle anesthesia.  Does have a personal history of cancer.  No recent increase in his back pain.  No new weakness.  He does have residual right leg weakness as a result of his CVA. 3. Smoker - continues to smoke despite known COPD and Hx of lung cancer.  No interest in quitting. 4. COPD Chronic cough is at baseline.   5. Insomnia is a big complaint of his.  I don't sleep at all (discordant with his history of waking up with incontinence).  I really need something to sleep. 6. Depression was not a complaint and scored very high on his PHQ9.  No SI.  Not interested in medications or counseling. 7. High risk social situation.  Lives in halfway house.  Only surviving family is a sister.  Also has a very good NA sponsor.      OBJECTIVE:   BP 126/84   Pulse 94   Wt 147 lb 6.4 oz (66.9 kg)   SpO2 96%   BMI 23.79 kg/m   Moderate weight loss noted. Abd benign. REctal, normal sphincter tone.  Vault is empty.  Prostate not enlarged. Affect is sad at times as we discussed his life situation.    ASSESSMENT/PLAN:  Overall, it is difficult for me to piece all this together.  While he lead with incontinence, my gestalt is that depression is the main driving problem.  I chose to treat and FU in one month. Urinary incontinence Old problem  Still difficult for me to characterize.  No changes at this time.  Fecal incontinence Does not sound like true incontinence.  Sounds more like mild loss of integrity of the sphincter.    Insomnia Treat with mirtazipine which hopefully will improve sleep,  improve depression and improve appetite.    Late effect of cerebrovascular accident (CVA) Suspect slow gait is contributing to his incontience issues.  Adjustment reaction with anxiety and depression Trial of mirtazipine to help with depression, sleep and appetite.  Tobacco abuse No interest in quitting.  It is one of the few pleasures he still gets out of life.       Zenia Resides, MD [place]

## 2020-04-28 ENCOUNTER — Encounter: Payer: Self-pay | Admitting: Family Medicine

## 2020-04-28 ENCOUNTER — Ambulatory Visit (INDEPENDENT_AMBULATORY_CARE_PROVIDER_SITE_OTHER): Payer: Medicare HMO | Admitting: Family Medicine

## 2020-04-28 ENCOUNTER — Other Ambulatory Visit: Payer: Self-pay

## 2020-04-28 DIAGNOSIS — Z599 Problem related to housing and economic circumstances, unspecified: Secondary | ICD-10-CM | POA: Diagnosis not present

## 2020-04-28 DIAGNOSIS — I2510 Atherosclerotic heart disease of native coronary artery without angina pectoris: Secondary | ICD-10-CM | POA: Diagnosis not present

## 2020-04-28 DIAGNOSIS — Z515 Encounter for palliative care: Secondary | ICD-10-CM | POA: Diagnosis not present

## 2020-04-28 DIAGNOSIS — I679 Cerebrovascular disease, unspecified: Secondary | ICD-10-CM | POA: Diagnosis not present

## 2020-04-28 DIAGNOSIS — G4700 Insomnia, unspecified: Secondary | ICD-10-CM | POA: Diagnosis not present

## 2020-04-28 DIAGNOSIS — Z59819 Housing instability, housed unspecified: Secondary | ICD-10-CM

## 2020-04-28 DIAGNOSIS — I6930 Unspecified sequelae of cerebral infarction: Secondary | ICD-10-CM | POA: Diagnosis not present

## 2020-04-28 DIAGNOSIS — Z720 Tobacco use: Secondary | ICD-10-CM

## 2020-04-28 MED ORDER — ZOLPIDEM TARTRATE 5 MG PO TABS: 5.0000 mg | ORAL_TABLET | Freq: Every day | ORAL | 1 refills | Status: DC

## 2020-04-28 MED ORDER — CLOPIDOGREL BISULFATE 75 MG PO TABS: ORAL_TABLET | ORAL | 3 refills | Status: DC

## 2020-04-28 NOTE — Patient Instructions (Signed)
I sent in two prescriptions.  A new one to help you sleep and a blood thinner to prevent strokes. You would sleep better at night if you got out and walked more.  I am sorry life is so hard.  Let me know if I can help make it better.  I want to try to help.

## 2020-04-29 NOTE — Assessment & Plan Note (Signed)
See note on insomnia - I see him as a comfort care approach.

## 2020-04-29 NOTE — Assessment & Plan Note (Signed)
My one glimmer of hope that he may want to live is that when asked about restarting the plavix, he said he would take it if he was supposed to be on it.

## 2020-04-29 NOTE — Progress Notes (Signed)
    SUBJECTIVE:   CHIEF COMPLAINT / HPI:   FU Insomnia and other problems.  I believe that Donny's biggest problem is depression - not the acute reversible type - rather the "I have given up on life" type.  When asked: What is your life like.  He replied "Like shit."  Of his large family, only he and his sister are left.  He has had a stroke and has considerably limited mobility afterward.  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  He is responsible for his own cooking and "I don't like to cook." Whereas he previously enjoyed and did a lot of walking outside year round, he now stays in his room, watches TV, and doesn't talk with the other residents.  He then can't sleep at night and continues to watch TV in bed.  He smokes heavily and has no interest in quitting.  When asked, Would dying be a good thing or a bad thing, he had a long pause before responding "Probably a good thing." He has no plans to end his life. His sister lives in a different city.  She has some contact with him such as taking him to doctor appointments.    He is not interested in therapy.  He quit taking the mirtazipine because it did not help him sleep.   He is using his inhalors.  He brought his meds in and plavix was not included.  He doesn't know how or when he stopped it.      OBJECTIVE:   BP 140/82   Pulse (!) 106   Ht 5\' 6"  (1.676 m)   Wt 146 lb 9.6 oz (66.5 kg)   SpO2 100%   BMI 23.66 kg/m   Disheveled with long stringy hair and long fingernails.   Cognition is normal.  Affect is stoic with glimpses of sadness throughout.  Mostly, he seems resigned to his poor lot in life. Lungs, scattered coarse rhonchi. Cardiac RRR without m or g   ASSESSMENT/PLAN:   Palliative care encounter See note on insomnia - I see him as a comfort care approach.  Housing situation unstable Lives in group home for recovering alcoholics and addicts.  Tobacco abuse At this point and again  taking the comfort care approach, I will not pester him further about smoking cessation.  Coronary artery disease involving native coronary artery of native heart without angina pectoris My one glimmer of hope that he may want to live is that when asked about restarting the plavix, he said he would take it if he was supposed to be on it.  Insomnia While I normally avoid ambien, especially in recovering alcoholics, I will use for him since I conceptualize him as needing a comfort care approach.     Zenia Resides, MD [place]

## 2020-04-29 NOTE — Assessment & Plan Note (Signed)
Lives in group home for recovering alcoholics and addicts.

## 2020-04-29 NOTE — Assessment & Plan Note (Signed)
While I normally avoid ambien, especially in recovering alcoholics, I will use for him since I conceptualize him as needing a comfort care approach.

## 2020-04-29 NOTE — Assessment & Plan Note (Signed)
At this point and again taking the comfort care approach, I will not pester him further about smoking cessation.

## 2020-08-16 ENCOUNTER — Other Ambulatory Visit: Payer: Self-pay | Admitting: Family Medicine

## 2020-08-16 DIAGNOSIS — G4700 Insomnia, unspecified: Secondary | ICD-10-CM

## 2020-10-27 ENCOUNTER — Encounter: Payer: Self-pay | Admitting: Family Medicine

## 2020-10-27 ENCOUNTER — Ambulatory Visit (INDEPENDENT_AMBULATORY_CARE_PROVIDER_SITE_OTHER): Payer: Medicare HMO | Admitting: Family Medicine

## 2020-10-27 ENCOUNTER — Ambulatory Visit (INDEPENDENT_AMBULATORY_CARE_PROVIDER_SITE_OTHER): Payer: Medicare HMO

## 2020-10-27 ENCOUNTER — Other Ambulatory Visit: Payer: Self-pay

## 2020-10-27 DIAGNOSIS — S5012XA Contusion of left forearm, initial encounter: Secondary | ICD-10-CM

## 2020-10-27 DIAGNOSIS — Z59819 Housing instability, housed unspecified: Secondary | ICD-10-CM

## 2020-10-27 DIAGNOSIS — I10 Essential (primary) hypertension: Secondary | ICD-10-CM

## 2020-10-27 DIAGNOSIS — F17200 Nicotine dependence, unspecified, uncomplicated: Secondary | ICD-10-CM | POA: Diagnosis not present

## 2020-10-27 DIAGNOSIS — Z599 Problem related to housing and economic circumstances, unspecified: Secondary | ICD-10-CM | POA: Diagnosis not present

## 2020-10-27 DIAGNOSIS — F4323 Adjustment disorder with mixed anxiety and depressed mood: Secondary | ICD-10-CM | POA: Diagnosis not present

## 2020-10-27 DIAGNOSIS — I739 Peripheral vascular disease, unspecified: Secondary | ICD-10-CM | POA: Diagnosis not present

## 2020-10-27 DIAGNOSIS — J449 Chronic obstructive pulmonary disease, unspecified: Secondary | ICD-10-CM | POA: Diagnosis not present

## 2020-10-27 DIAGNOSIS — Z23 Encounter for immunization: Secondary | ICD-10-CM

## 2020-10-27 DIAGNOSIS — Z0000 Encounter for general adult medical examination without abnormal findings: Secondary | ICD-10-CM

## 2020-10-27 DIAGNOSIS — I6930 Unspecified sequelae of cerebral infarction: Secondary | ICD-10-CM

## 2020-10-27 MED ORDER — SERTRALINE HCL 50 MG PO TABS: 50.0000 mg | ORAL_TABLET | Freq: Every day | ORAL | 3 refills | Status: DC

## 2020-10-27 NOTE — Patient Instructions (Signed)
I sent in a prescription for the antidepressant medicine that I want you to try.   Someone should call about assisted living and other help you might qualify for. You got a tetanus shot and a covid vaccine today.   See me in three months.

## 2020-10-28 ENCOUNTER — Other Ambulatory Visit: Payer: Self-pay

## 2020-10-28 ENCOUNTER — Encounter: Payer: Self-pay | Admitting: Family Medicine

## 2020-10-28 DIAGNOSIS — Z0000 Encounter for general adult medical examination without abnormal findings: Secondary | ICD-10-CM | POA: Insufficient documentation

## 2020-10-28 DIAGNOSIS — J441 Chronic obstructive pulmonary disease with (acute) exacerbation: Secondary | ICD-10-CM

## 2020-10-28 NOTE — Assessment & Plan Note (Signed)
Needs assisted living.  Unsafe in current situation.

## 2020-10-28 NOTE — Assessment & Plan Note (Signed)
No current interest in quitting.

## 2020-10-28 NOTE — Assessment & Plan Note (Signed)
Stable on current meds 

## 2020-10-28 NOTE — Assessment & Plan Note (Signed)
Severe.  Add sertraline.  A change in environment would be a much more effective intervention.

## 2020-10-28 NOTE — Progress Notes (Signed)
    SUBJECTIVE:   CHIEF COMPLAINT / HPI:   Annual exam: Acute problems 1. High risk social situation.  Patient comes in with his pastor today.  Gives permission to share protected health information whith him, Gene Ples Specter [phone].  [name] continues to live in a halfway house.  He has difficulty performing his ADLs due to late effects from a CVA.  He has largely withdrawn.  Continues to smoke with no interest in quitting. "It is one of the few joys left in my life."  He spends most days, all day in his room with the curtains drawn, watching old movies on DVD.  Of course, his days and nights are confused.  After spending all day in bed, he cannot sleep at night.  Nutrition is poor.  He is responsible for his own meals.  The halfway house reportedly has a single functioning hotplate.  A typical meal is beans with cut up hotdogs.  His room is a fall risk with clutter everywhere (pastor showed me a video of the room.) Acute Problems 1. Lost balance and fell on way here.  Has an abrasion of his left forearm.   Chronic problems, 1.  Secondary prevention of ASCVD. States he remains on his atrovastatin and clopidogrel.  No CP or new weakness 2. COPD Continues to smoke.  Continues trelogy.  No CO wheeze or DOE. 3. Tobacco abuse.  No interest in quitting. 4. Depression.  No joy in life.  The question is: Can this be reversed? 5. Urinary incontinuence.  Remains on flomax.  Wears pullups.  Often, he does not make the effort to go to the bathroom, HPDP Due for tetanus booster - especially with arm abrasion today. Only one COVID vaccine thus far.  Willing to have second today. Also due for shingles and pneumonia.  Differ since receiving two vaccines today.      OBJECTIVE:   BP 138/82   Pulse (!) 110   Ht 5\' 7"  (1.702 m)   Wt 140 lb (63.5 kg)   SpO2 98%   BMI 21.93 kg/m   HEENT WNL Neck supple without masses Lungs clear Cardiac RRR without m or g Abd benign Ext no edema Neuro. Flat  affect.    ASSESSMENT/PLAN:   Adjustment reaction with anxiety and depression Severe.  Add sertraline.  A change in environment would be a much more effective intervention.  Benign essential HTN Stable on current meds.  COPD, moderate ([place]) Stable on current meds.  Preventative health care Poor health, physically and emotionally.  High risk social situation.  Lacks motivation to improve health with a gaol of living longer.  Peripheral arterial disease ([place]) Secondary prevention with plavix and lipitor.  Housing situation unstable Needs assisted living.  Unsafe in current situation.  Smoker No current interest in quitting.     Zenia Resides, MD [place]

## 2020-10-28 NOTE — Assessment & Plan Note (Signed)
Poor health, physically and emotionally.  High risk social situation.  Lacks motivation to improve health with a gaol of living longer.

## 2020-10-28 NOTE — Assessment & Plan Note (Signed)
Secondary prevention with plavix and lipitor.

## 2020-11-02 ENCOUNTER — Telehealth: Payer: Self-pay | Admitting: *Deleted

## 2020-11-02 NOTE — Telephone Encounter (Signed)
Pastor Gene Ples Specter called on behalf of patient, who was here with him at his last visit.  He states that patient called him and said that he lost his dentures and will need a new pair.  Mr. Ples Specter was provided a name of a place called [facility] and will look into if they accept his insurance.  He is also going to visit with the patient today to see if he can help him find the dentures in his "poorly lit" room.  I have added a note to the CCM referral asking for possible help with finding a place for dentures also.  Akshay Spang,CMA

## 2020-11-05 ENCOUNTER — Telehealth: Payer: Self-pay

## 2020-11-05 NOTE — Telephone Encounter (Signed)
   Telephone encounter was:  Successful.  11/05/2020 Name: [name] MRN: [id number] DOB: Jul 23, 1955  [name] is a 65 y.o. year old male who is a primary care patient of Hensel, Jamal Collin, MD . The community resource team was consulted for assistance with  assisted living facility list  Care guide performed the following interventions: Spoke with patient's sister [name], she requested that I mail the list of assisted living facilities to her address [address], [place], [place] [address]. I will send resource letter and list to printer at [facility] to be mailed..  Follow Up Plan:  Care guide will follow up with patient by phone over the next 7 days  [name], [name], [facility]  [address], [place] [address] ??[email or web address]  ?? [phone]   [email or web address]

## 2020-11-09 ENCOUNTER — Telehealth: Payer: Self-pay

## 2020-11-09 NOTE — Telephone Encounter (Signed)
   Telephone encounter was:  Successful.  11/09/2020 Name: [name] MRN: [id number] DOB: 08-Nov-1955  [name] is a 65 y.o. year old male who is a primary care patient of Hensel, Jamal Collin, MD . The community resource team was consulted for assistance with  a lift of assisted living facilities  Care guide performed the following interventions: Spoke with patient's pastor Gene Ples Specter he requested that I email the assisted living facility list to him at [email or web address]. .  Follow Up Plan:  Care guide will follow up with patient by phone over the next 7 days  Susi Goslin, AAS Paralegal, [facility]  [address], [place] [address] ??[email or web address]  ?? [phone]   [email or web address]

## 2020-11-10 ENCOUNTER — Telehealth: Payer: Self-pay

## 2020-11-10 NOTE — Telephone Encounter (Signed)
   Telephone encounter was:  Successful.  11/10/2020 Name: [name] MRN: [id number] DOB: 25-Apr-1956  [name] is a 65 y.o. year old male who is a primary care patient of Hensel, Jamal Collin, MD . The community resource team was consulted for assistance with  referral to rehabilitation facilty.  Care guide performed the following interventions:  Received call from [name] he stated that the patient needs a referral to placed to [facility]. He stated that he left a message for Paige in the office regarding the referral.  Follow Up Plan:  No further follow up planned at this time. The patient has been provided with needed resources.  Alajia Schmelzer, AAS Paralegal, [facility]  [address], [place] [address] ??[email or web address]  ?? [phone]   [email or web address]

## 2020-11-11 ENCOUNTER — Telehealth: Payer: Self-pay | Admitting: *Deleted

## 2020-11-11 NOTE — Telephone Encounter (Signed)
Arcelia Jew called on behalf of patient.  He is concerned for patient's recent decline in health (bowel/urinary incontinence, lessen desire to go anything, etc).  He states that him and patient's sister have been speaking about next steps for patient and what kind of care would be better for him.  I did try to call the sister and see what day would be good to have patient in for an appt with PCP but there was no answer and voicemail was full.  I will send note to PCP and will see if he is able to reach her about concerns.  Symphoni Helbling,CMA

## 2020-11-11 NOTE — Telephone Encounter (Signed)
Long conversation with pastor Ples Specter.  Patient has continued to decline since last visit.  Frequent falls.  Marginal food.  "I think he has given up his will to live."  I will continue to do my best to mobilize resources.  I fear he will soon be at the crisis stage and require ER visit leading to hospitalization.

## 2020-11-12 ENCOUNTER — Emergency Department (HOSPITAL_COMMUNITY)
Admission: EM | Admit: 2020-11-12 | Discharge: 2020-11-12 | Discharge status: Against Medical Advice | Payer: Medicare HMO | Attending: Emergency Medicine | Admitting: Emergency Medicine

## 2020-11-12 ENCOUNTER — Telehealth: Payer: Self-pay | Admitting: *Deleted

## 2020-11-12 ENCOUNTER — Emergency Department (HOSPITAL_COMMUNITY): Payer: Medicare HMO

## 2020-11-12 ENCOUNTER — Encounter (HOSPITAL_COMMUNITY): Payer: Self-pay

## 2020-11-12 DIAGNOSIS — Z7951 Long term (current) use of inhaled steroids: Secondary | ICD-10-CM | POA: Insufficient documentation

## 2020-11-12 DIAGNOSIS — W19XXXA Unspecified fall, initial encounter: Secondary | ICD-10-CM

## 2020-11-12 DIAGNOSIS — Z20822 Contact with and (suspected) exposure to covid-19: Secondary | ICD-10-CM | POA: Insufficient documentation

## 2020-11-12 DIAGNOSIS — I10 Essential (primary) hypertension: Secondary | ICD-10-CM | POA: Diagnosis not present

## 2020-11-12 DIAGNOSIS — W01198A Fall on same level from slipping, tripping and stumbling with subsequent striking against other object, initial encounter: Secondary | ICD-10-CM | POA: Diagnosis not present

## 2020-11-12 DIAGNOSIS — R0902 Hypoxemia: Secondary | ICD-10-CM | POA: Diagnosis not present

## 2020-11-12 DIAGNOSIS — R778 Other specified abnormalities of plasma proteins: Secondary | ICD-10-CM

## 2020-11-12 DIAGNOSIS — I2510 Atherosclerotic heart disease of native coronary artery without angina pectoris: Secondary | ICD-10-CM | POA: Diagnosis not present

## 2020-11-12 DIAGNOSIS — Z79899 Other long term (current) drug therapy: Secondary | ICD-10-CM | POA: Diagnosis not present

## 2020-11-12 DIAGNOSIS — S0083XA Contusion of other part of head, initial encounter: Secondary | ICD-10-CM | POA: Diagnosis not present

## 2020-11-12 DIAGNOSIS — F17210 Nicotine dependence, cigarettes, uncomplicated: Secondary | ICD-10-CM | POA: Diagnosis not present

## 2020-11-12 DIAGNOSIS — R627 Adult failure to thrive: Secondary | ICD-10-CM | POA: Diagnosis not present

## 2020-11-12 DIAGNOSIS — J441 Chronic obstructive pulmonary disease with (acute) exacerbation: Secondary | ICD-10-CM | POA: Diagnosis not present

## 2020-11-12 DIAGNOSIS — Z85118 Personal history of other malignant neoplasm of bronchus and lung: Secondary | ICD-10-CM | POA: Diagnosis not present

## 2020-11-12 DIAGNOSIS — S0081XA Abrasion of other part of head, initial encounter: Secondary | ICD-10-CM | POA: Diagnosis not present

## 2020-11-12 DIAGNOSIS — Y92002 Bathroom of unspecified non-institutional (private) residence single-family (private) house as the place of occurrence of the external cause: Secondary | ICD-10-CM | POA: Insufficient documentation

## 2020-11-12 DIAGNOSIS — S0993XA Unspecified injury of face, initial encounter: Secondary | ICD-10-CM | POA: Diagnosis present

## 2020-11-12 DIAGNOSIS — Z7902 Long term (current) use of antithrombotics/antiplatelets: Secondary | ICD-10-CM | POA: Diagnosis not present

## 2020-11-12 DIAGNOSIS — R609 Edema, unspecified: Secondary | ICD-10-CM | POA: Diagnosis not present

## 2020-11-12 DIAGNOSIS — R079 Chest pain, unspecified: Secondary | ICD-10-CM | POA: Diagnosis not present

## 2020-11-12 LAB — URINALYSIS, ROUTINE W REFLEX MICROSCOPIC
Bilirubin Urine: NEGATIVE
Glucose, UA: NEGATIVE mg/dL
Ketones, ur: NEGATIVE mg/dL
Nitrite: NEGATIVE
Protein, ur: NEGATIVE mg/dL
Specific Gravity, Urine: 1.002 — ABNORMAL LOW (ref 1.005–1.030)
pH: 7 (ref 5.0–8.0)

## 2020-11-12 LAB — TROPONIN I (HIGH SENSITIVITY)
Troponin I (High Sensitivity): 16 ng/L (ref <18)
Troponin I (High Sensitivity): 70 ng/L — ABNORMAL HIGH (ref <18)

## 2020-11-12 LAB — CBC WITH DIFFERENTIAL/PLATELET
Abs Immature Granulocytes: 0.05 10*3/uL (ref 0.00–0.07)
Basophils Absolute: 0 10*3/uL (ref 0.0–0.1)
Basophils Relative: 0 %
Eosinophils Absolute: 0 10*3/uL (ref 0.0–0.5)
Eosinophils Relative: 0 %
HCT: 34.2 % — ABNORMAL LOW (ref 39.0–52.0)
Hemoglobin: 11.2 g/dL — ABNORMAL LOW (ref 13.0–17.0)
Immature Granulocytes: 1 %
Lymphocytes Relative: 8 %
Lymphs Abs: 0.8 10*3/uL (ref 0.7–4.0)
MCH: 33.2 pg (ref 26.0–34.0)
MCHC: 32.7 g/dL (ref 30.0–36.0)
MCV: 101.5 fL — ABNORMAL HIGH (ref 80.0–100.0)
Monocytes Absolute: 0.7 10*3/uL (ref 0.1–1.0)
Monocytes Relative: 7 %
Neutro Abs: 8.2 10*3/uL — ABNORMAL HIGH (ref 1.7–7.7)
Neutrophils Relative %: 84 %
Platelets: 277 10*3/uL (ref 150–400)
RBC: 3.37 MIL/uL — ABNORMAL LOW (ref 4.22–5.81)
RDW: 13.3 % (ref 11.5–15.5)
WBC: 9.8 10*3/uL (ref 4.0–10.5)
nRBC: 0 % (ref 0.0–0.2)

## 2020-11-12 LAB — COMPREHENSIVE METABOLIC PANEL
ALT: 11 U/L (ref 0–44)
AST: 11 U/L — ABNORMAL LOW (ref 15–41)
Albumin: 3 g/dL — ABNORMAL LOW (ref 3.5–5.0)
Alkaline Phosphatase: 94 U/L (ref 38–126)
Anion gap: 7 (ref 5–15)
BUN: 14 mg/dL (ref 8–23)
CO2: 26 mmol/L (ref 22–32)
Calcium: 8.3 mg/dL — ABNORMAL LOW (ref 8.9–10.3)
Chloride: 99 mmol/L (ref 98–111)
Creatinine, Ser: 1.08 mg/dL (ref 0.61–1.24)
GFR, Estimated: >60 mL/min (ref >60)
Glucose, Bld: 107 mg/dL — ABNORMAL HIGH (ref 70–99)
Potassium: 4.2 mmol/L (ref 3.5–5.1)
Sodium: 132 mmol/L — ABNORMAL LOW (ref 135–145)
Total Bilirubin: 0.6 mg/dL (ref 0.3–1.2)
Total Protein: 5.8 g/dL — ABNORMAL LOW (ref 6.5–8.1)

## 2020-11-12 LAB — LACTIC ACID, PLASMA: Lactic Acid, Venous: 1.1 mmol/L (ref 0.5–1.9)

## 2020-11-12 LAB — CBG MONITORING, ED: Glucose-Capillary: 120 mg/dL — ABNORMAL HIGH (ref 70–99)

## 2020-11-12 LAB — SARS CORONAVIRUS 2 (TAT 6-24 HRS): SARS Coronavirus 2: NEGATIVE

## 2020-11-12 MED ORDER — LACTATED RINGERS IV BOLUS
1000.0000 mL | Freq: Once | INTRAVENOUS | Status: AC
  Administered 2020-11-12: 1000 mL via INTRAVENOUS

## 2020-11-12 MED ORDER — CEPHALEXIN 500 MG PO CAPS: 500.0000 mg | ORAL_CAPSULE | Freq: Four times a day (QID) | ORAL | 0 refills | Status: DC

## 2020-11-12 NOTE — Telephone Encounter (Signed)
[name] just wanted to inform PCP that patient has had 2 falls today and he is being taken to the hospital for evaluation.  Jodie Leiner,CMA

## 2020-11-12 NOTE — Chronic Care Management (AMB) (Signed)
  Care Management   Outreach Note  11/12/2020 Name: [name] MRN: [id number] DOB: Jun 13, 1955  Referred by: Zenia Resides, MD Reason for referral : Care Coordination (Outreach to schedule initial call with Licensed Clinical SW )   A telephone outreach was attempted today. The patient was referred to the case management team for assistance with care management and care coordination.   Follow Up Plan: The care management team will reach out to the patient again over the next 7 days.  If patient returns call to provider office, please advise to call [facility] at [phone].  [facility]

## 2020-11-12 NOTE — ED Provider Notes (Signed)
Patient signed out to me by previous provider, please refer to their note for full HPI.  Briefly this is a 65 year old male who presented to the emergency department after multiple falls.  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  CT imaging from a trauma standpoint are negative, labs are reassuring.  At signout plan was to check for urinalysis, repeat Trope and ambulate the patient with the potential to discharge him home. Physical Exam  BP (!) 146/115   Pulse 84   Temp 98.9 F (37.2 C) (Rectal)   Resp 19   SpO2 97%   Physical Exam Vitals and nursing note reviewed.  Constitutional:      Appearance: Normal appearance.  HENT:     Head: Normocephalic.     Comments: Facial abrasions    Mouth/Throat:     Mouth: Mucous membranes are moist.  Cardiovascular:     Rate and Rhythm: Normal rate.  Pulmonary:     Effort: Pulmonary effort is normal. No respiratory distress.  Abdominal:     Palpations: Abdomen is soft.     Tenderness: There is no abdominal tenderness.  Skin:    General: Skin is warm.  Neurological:     Mental Status: He is alert and oriented to person, place, and time. Mental status is at baseline.  Psychiatric:        Mood and Affect: Mood normal.    ED Course/Procedures     Procedures  MDM   Urinalysis has leukocytes and bacteria, will send for culture and treat for potential UTI.  However his second troponin elevated to 70, this is a significant delta from his previous troponin which was normal.  Patient denies any recent cocaine use, denies any chest pain or shortness of breath.  However given this delta and acute change I recommended that the patient stays in the hospital for further evaluation.  He has politely declined.  He understands the risks associated with leaving AGAINST MEDICAL ADVICE including permanent disability and death.  The sister was at bedside for this conversation.  He has capacity to make this decision and is alert and oriented.  I was unable to convince  the patient to stay for further cardiac evaluation.  Sister states that she will follow-up with the patient closely, he has a primary doctor appointment on Tuesday and she will call the cardiologist on Monday.  I have recommended that the patient stays in the department for the completion of their workup however they decline.  I have expressed the importance of staying including the risks of leaving which include worsening condition, permanent disability and death.  Patient accepts these risks.  They are alert and oriented, they have capacity to make this decision.  I have not been able to convince the patient to stay and they understand the risks of leaving [facility].         Lorelle Gibbs, DO 11/12/20 1941

## 2020-11-12 NOTE — ED Notes (Signed)
Patient transported to CT 
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
## 2020-11-12 NOTE — Discharge Instructions (Addendum)
You have chosen to leave the emergency department [place].  Your heart enzyme is elevated, it has been recommended that you be admitted to the hospital for further blood work and evaluation.  I have explained to you your testing results and the need for further evaluation/admission.  You have verbalized understanding of these results and plan and are choosing to leave the hospital [place]. You understand the risks associated with leaving without further evaluation/treatment. If you have any worsening symptoms or choose to be treated please return immediately to emergency department.  Your urinalysis looks suspicious for UTI, take antibiotics as directed, urine culture has been sent.

## 2020-11-12 NOTE — ED Notes (Signed)
Patient transported to X-ray 

## 2020-11-12 NOTE — ED Triage Notes (Signed)
T bib [facility] from group home. Per ems, pt has been having many falls recently. Pt feel twice today . First time,Pt fell hit his head on bathtub. Denied LOC. Second time, [place] off the porch per bystander, also denied LOC. Pt having diarrhea. Pt recovering from cocaine. 400cc saline given enroute with ems.   Cbg 151 HR 98  Bp 114/71

## 2020-11-15 NOTE — Telephone Encounter (Signed)
Noted.  Advised to be admitted.  Patient signed out AMA.  Concern was elevated troponins.

## 2020-11-16 ENCOUNTER — Other Ambulatory Visit: Payer: Self-pay

## 2020-11-16 ENCOUNTER — Ambulatory Visit (HOSPITAL_COMMUNITY)
Admission: RE | Admit: 2020-11-16 | Discharge: 2020-11-16 | Disposition: A | Discharge status: Home / Self Care | Payer: Medicare HMO | Source: Ambulatory Visit | Attending: Family Medicine | Admitting: Family Medicine

## 2020-11-16 ENCOUNTER — Encounter: Payer: Self-pay | Admitting: Family Medicine

## 2020-11-16 ENCOUNTER — Ambulatory Visit (INDEPENDENT_AMBULATORY_CARE_PROVIDER_SITE_OTHER): Payer: Medicare HMO | Admitting: Family Medicine

## 2020-11-16 VITALS — BP 155/86 | HR 81 | Wt 134.0 lb

## 2020-11-16 DIAGNOSIS — R634 Abnormal weight loss: Secondary | ICD-10-CM | POA: Diagnosis not present

## 2020-11-16 DIAGNOSIS — R159 Full incontinence of feces: Secondary | ICD-10-CM

## 2020-11-16 DIAGNOSIS — R778 Other specified abnormalities of plasma proteins: Secondary | ICD-10-CM

## 2020-11-16 DIAGNOSIS — R32 Unspecified urinary incontinence: Secondary | ICD-10-CM | POA: Diagnosis not present

## 2020-11-16 DIAGNOSIS — R42 Dizziness and giddiness: Secondary | ICD-10-CM | POA: Diagnosis not present

## 2020-11-16 NOTE — Patient Instructions (Addendum)
It was nice to see you today,  I have ordered some blood tests as well as a CT scan of your chest.  I am concerned about your weight loss and believe this should be worked up further.  I have scheduled you an appointment with your primary care doctor, Dr. Andria Frames for next week.  Hopefully you will be able to get the CT scanning of the chest before then.  I have included some samples of Ensure protein shakes.  Please try to drink 2 of these a day.  There are other similar brands out there.  They are available at grocery stores.  Have a great day,  Clemetine Marker, MD

## 2020-11-16 NOTE — Progress Notes (Signed)
    SUBJECTIVE:   CHIEF COMPLAINT / HPI:   Patient here for follow-up for recent ED visit in which she left AMA..  ED visit was for multiple falls he sustained while at the halfway house.  Reason for admission was an elevated troponin.  Since that time he states he has not had any falls or dizziness.  No headaches.  No chest pain.  Patient has since moved out of his halfway house and is living with his sister who has accompanied him to the visit today.  Weight loss: Patient states he eats 2-3 times a day.  Appetite is decreased.  Has no nausea vomiting or diarrhea.  No dysphagia.  No abdominal pain.  Has difficulty eating foods due to the lack of teeth.  While doing med rec with patient it is unclear which medications or how much of each medication he is taking.  There is disagreement between him and his sister about whether he takes 1 or 3 of his Ambien at night or 1 or 3 of his Tylenol PM.  Medications were not brought to the appointment.   PERTINENT  PMH / PSH: History of lung cancer s/p treatment.  OBJECTIVE:   BP (!) 155/86   Pulse 81   Wt 134 lb (60.8 kg)   BMI 20.99 kg/m   General: Alert and oriented.  No acute distress.  Thin male, appears older than stated age.  Accompanied by sister. CV: Regular rate and rhythm, no murmurs Pulmonary: Prolonged expiratory phase.  Some mild low pitched wheezing bilaterally MSK: Patient walking with walker.  Was able to ambulate from chair to table on his own.  Gait is slowed.  Strength is equal bilaterally 4/5 upper and lower extremities. Neuro: Cranial nerves II through XII grossly intact. Skin: Abrasion on the right cheek.  Yellowing of the skin on the hands.  ASSESSMENT/PLAN:   Weight loss Patient has had an unintentional weight loss of 26 pounds in the past year.  Looks frail on exam.  Could partially attribute it to environmental factors (finances, substance abuse) and the fact he is a dentulous but I have significant concern for a  malignancy given his history of lung cancer that was treated approximately 10 years ago.  He is still a smoker.  Nicotine stains on his fingers would indicate multiple packs per day possibly.  We will order lung CT to start.  Have scheduled him an appoint with his PCP for next week.  Gave him some boost protein drink samples and advised him to drink protein drinks twice a day at least.  Troponin level elevated In the ED patient's troponin went from 16-70.  He declined admission at that time.  Since then has not had any chest pain.  EKG obtained today.  No indication of ST changes.  We will continue to monitor, no changes.  Dizziness No falls or dizziness since previous.  Uncertain what could have caused it.  Patient states it was not a loss of consciousness.  May be due to overall frailty and difficulties with ambulation.  Continue to monitor.     Benay Pike, MD [place]

## 2020-11-17 DIAGNOSIS — R778 Other specified abnormalities of plasma proteins: Secondary | ICD-10-CM | POA: Insufficient documentation

## 2020-11-17 LAB — CBC
Hematocrit: 32.9 % — ABNORMAL LOW (ref 37.5–51.0)
Hemoglobin: 11.7 g/dL — ABNORMAL LOW (ref 13.0–17.7)
MCH: 34.5 pg — ABNORMAL HIGH (ref 26.6–33.0)
MCHC: 35.6 g/dL (ref 31.5–35.7)
MCV: 97 fL (ref 79–97)
Platelets: 307 10*3/uL (ref 150–450)
RBC: 3.39 x10E6/uL — ABNORMAL LOW (ref 4.14–5.80)
RDW: 12.8 % (ref 11.6–15.4)
WBC: 7.3 10*3/uL (ref 3.4–10.8)

## 2020-11-17 LAB — COMPREHENSIVE METABOLIC PANEL
ALT: 22 IU/L (ref 0–44)
AST: 17 IU/L (ref 0–40)
Albumin/Globulin Ratio: 1.8 (ref 1.2–2.2)
Albumin: 4.1 g/dL (ref 3.8–4.8)
Alkaline Phosphatase: 130 IU/L — ABNORMAL HIGH (ref 44–121)
BUN/Creatinine Ratio: 12 (ref 10–24)
BUN: 13 mg/dL (ref 8–27)
Bilirubin Total: 0.3 mg/dL (ref 0.0–1.2)
CO2: 22 mmol/L (ref 20–29)
Calcium: 9.2 mg/dL (ref 8.6–10.2)
Chloride: 98 mmol/L (ref 96–106)
Creatinine, Ser: 1.09 mg/dL (ref 0.76–1.27)
Globulin, Total: 2.3 g/dL (ref 1.5–4.5)
Glucose: 93 mg/dL (ref 65–99)
Potassium: 4.2 mmol/L (ref 3.5–5.2)
Sodium: 139 mmol/L (ref 134–144)
Total Protein: 6.4 g/dL (ref 6.0–8.5)
eGFR: 75 mL/min/{1.73_m2} (ref >59)

## 2020-11-17 LAB — TSH: TSH: 1.05 u[IU]/mL (ref 0.450–4.500)

## 2020-11-17 NOTE — Assessment & Plan Note (Signed)
Patient has had an unintentional weight loss of 26 pounds in the past year.  Looks frail on exam.  Could partially attribute it to environmental factors (finances, substance abuse) and the fact he is a dentulous but I have significant concern for a malignancy given his history of lung cancer that was treated approximately 10 years ago.  He is still a smoker.  Nicotine stains on his fingers would indicate multiple packs per day possibly.  We will order lung CT to start.  Have scheduled him an appoint with his PCP for next week.  Gave him some boost protein drink samples and advised him to drink protein drinks twice a day at least.

## 2020-11-17 NOTE — Assessment & Plan Note (Signed)
No falls or dizziness since previous.  Uncertain what could have caused it.  Patient states it was not a loss of consciousness.  May be due to overall frailty and difficulties with ambulation.  Continue to monitor.

## 2020-11-17 NOTE — Assessment & Plan Note (Addendum)
In the ED patient's troponin went from 16-70.  He declined admission at that time.  Since then has not had any chest pain.  EKG obtained today.  No indication of ST changes.  We will continue to monitor, no changes.

## 2020-11-19 NOTE — Chronic Care Management (AMB) (Signed)
  Care Management   Note  11/19/2020 Name: [name] MRN: [id number] DOB: 10-Apr-1956  [name] is a 65 y.o. year old male who is a primary care patient of Hensel, Jamal Collin, MD. I reached out to [name] by phone today in response to a referral sent by [name]'s PCP, Dr. Andria Frames.  Mr. Tavenner was given information about care management services today including:  Care management services include personalized support from designated clinical staff supervised by his physician, including individualized plan of care and coordination with other care providers 24/7 contact phone numbers for assistance for urgent and routine care needs. The patient may stop care management services at any time by phone call to the office staff.  Confirmed by sister Bary Leriche DPR on file verbally agreed to assistance and services provided by embedded care coordination/care management team today.  Follow up plan: Telephone appointment with care management team member scheduled for:11/25/2020  [place] [facility]

## 2020-11-22 ENCOUNTER — Ambulatory Visit (INDEPENDENT_AMBULATORY_CARE_PROVIDER_SITE_OTHER): Payer: Medicare HMO | Admitting: Family Medicine

## 2020-11-22 ENCOUNTER — Encounter: Payer: Self-pay | Admitting: Family Medicine

## 2020-11-22 ENCOUNTER — Other Ambulatory Visit: Payer: Self-pay

## 2020-11-22 DIAGNOSIS — I69398 Other sequelae of cerebral infarction: Secondary | ICD-10-CM

## 2020-11-22 DIAGNOSIS — F4323 Adjustment disorder with mixed anxiety and depressed mood: Secondary | ICD-10-CM

## 2020-11-22 DIAGNOSIS — G4700 Insomnia, unspecified: Secondary | ICD-10-CM

## 2020-11-22 DIAGNOSIS — J449 Chronic obstructive pulmonary disease, unspecified: Secondary | ICD-10-CM

## 2020-11-22 DIAGNOSIS — S0006XA Insect bite (nonvenomous) of scalp, initial encounter: Secondary | ICD-10-CM

## 2020-11-22 DIAGNOSIS — I2510 Atherosclerotic heart disease of native coronary artery without angina pectoris: Secondary | ICD-10-CM

## 2020-11-22 DIAGNOSIS — W57XXXA Bitten or stung by nonvenomous insect and other nonvenomous arthropods, initial encounter: Secondary | ICD-10-CM

## 2020-11-22 DIAGNOSIS — I679 Cerebrovascular disease, unspecified: Secondary | ICD-10-CM | POA: Diagnosis not present

## 2020-11-22 DIAGNOSIS — I69351 Hemiplegia and hemiparesis following cerebral infarction affecting right dominant side: Secondary | ICD-10-CM

## 2020-11-22 DIAGNOSIS — F17200 Nicotine dependence, unspecified, uncomplicated: Secondary | ICD-10-CM

## 2020-11-22 DIAGNOSIS — J441 Chronic obstructive pulmonary disease with (acute) exacerbation: Secondary | ICD-10-CM | POA: Diagnosis not present

## 2020-11-22 DIAGNOSIS — I6930 Unspecified sequelae of cerebral infarction: Secondary | ICD-10-CM | POA: Diagnosis not present

## 2020-11-22 DIAGNOSIS — R269 Unspecified abnormalities of gait and mobility: Secondary | ICD-10-CM

## 2020-11-22 MED ORDER — CLOPIDOGREL BISULFATE 75 MG PO TABS: ORAL_TABLET | ORAL | 3 refills | Status: AC

## 2020-11-22 MED ORDER — ATORVASTATIN CALCIUM 80 MG PO TABS: 80.0000 mg | ORAL_TABLET | Freq: Every day | ORAL | 3 refills | Status: AC

## 2020-11-22 MED ORDER — SERTRALINE HCL 50 MG PO TABS: 50.0000 mg | ORAL_TABLET | Freq: Every day | ORAL | 3 refills | Status: AC

## 2020-11-22 MED ORDER — ALBUTEROL SULFATE HFA 108 (90 BASE) MCG/ACT IN AERS: INHALATION_SPRAY | RESPIRATORY_TRACT | 12 refills | Status: AC

## 2020-11-22 MED ORDER — TAMSULOSIN HCL 0.4 MG PO CAPS: 0.4000 mg | ORAL_CAPSULE | Freq: Every day | ORAL | 3 refills | Status: AC

## 2020-11-22 NOTE — Patient Instructions (Addendum)
I refilled all of your medications.  Make sure you take them as prescribed. Do not buy any more cigarettes. Do not use any more ambien I am glad you are living with [name] now.  I think things can get better from here.

## 2020-11-23 ENCOUNTER — Encounter: Payer: Self-pay | Admitting: Family Medicine

## 2020-11-23 DIAGNOSIS — W57XXXA Bitten or stung by nonvenomous insect and other nonvenomous arthropods, initial encounter: Secondary | ICD-10-CM | POA: Insufficient documentation

## 2020-11-23 NOTE — Assessment & Plan Note (Signed)
Frequent falls

## 2020-11-23 NOTE — Assessment & Plan Note (Signed)
My biggest intervention is to order home health nurse assessment.  I am certain he will need PT.  Likely also needs OT  May need home equipment such as a shower bench.  I believe he can become significantly more functional with effort and therapy.  Previously, he did not have the desire to improve.  He seems more motivated now that he is living with his sister.

## 2020-11-23 NOTE — Progress Notes (Signed)
    SUBJECTIVE:   CHIEF COMPLAINT / HPI:   Tenuous status continues.  65 yo male S/P CVA with right hemiparisis who had been living in a half-way house now living with sister who accompanies him.  While this is a considerable improvement, sister has limited resources and medical literacy.  Issues for today. "He needs all his meds refilled."  Did not bring in meds.  I went ahead and sent in all new precriptions.  Sister has noted that he does not do well with ambien, increased confusion.  This was to be a short term med anyway.  Will DC. Still having frequent falls.  No PT or OT seeing him.  Sister concerned that she is not there 24/7 and also that she will need to go back to work this fall. Still with urinary incontinence.  Supplier delivered diapers, no pull-up which they desire. Neglecting personal hygeine.  Cannot shower with deep tub, which also may preclude a tub chair.  It might accomodate a bench.   Lump on right temple. Noticed recently Still smokes.  He cannot get out and buy them.  Previously, roommates were buying.  Now sister is. 8.  Seen in ER for falls.  Found to have elevated Trop.  Left AMA.  Denies chest pain.  Known CAD   OBJECTIVE:   BP 118/64   Pulse 96   Ht 5\' 7"  (1.702 m)   Wt 135 lb 12.8 oz (61.6 kg)   SpO2 98%   BMI 21.27 kg/m   VS and wt noted Lungs clear Cardiac RRR without m or g Head, engorged tick removed from right temple.  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  I am certain he will need PT.  Likely also needs OT  May need home equipment such as a shower bench.  I believe he can become significantly more functional with effort and therapy.  Previously, he did not have the desire to improve.  He seems more motivated now that he is living with  his sister.  Smoker Asked sister to quit buying cigarettes for [facility].  Coronary artery disease involving native coronary artery of native heart without angina pectoris Denies CP now.  My focus is to get him taking his meds regularly and start PT.  Tick bite Tick removed without difficulty.  Watch for signs of infection.     Zenia Resides, MD [place]

## 2020-11-23 NOTE — Assessment & Plan Note (Signed)
Tick removed without difficulty.  Watch for signs of infection.

## 2020-11-23 NOTE — Assessment & Plan Note (Signed)
Asked sister to quit buying cigarettes for [facility].

## 2020-11-23 NOTE — Assessment & Plan Note (Signed)
Denies CP now.  My focus is to get him taking his meds regularly and start PT.

## 2020-11-23 NOTE — Assessment & Plan Note (Signed)
Discontinue [name]

## 2020-11-25 ENCOUNTER — Ambulatory Visit: Payer: Medicare HMO | Admitting: Licensed Clinical Social Worker

## 2020-11-25 DIAGNOSIS — Z741 Need for assistance with personal care: Secondary | ICD-10-CM

## 2020-11-25 DIAGNOSIS — Z7189 Other specified counseling: Secondary | ICD-10-CM

## 2020-11-25 NOTE — Patient Instructions (Signed)
Licensed Clinical Social Worker Visit Information  Goals we discussed today:   Goals Addressed             This Visit's Progress    Maintain Mobility and Function        Patient Goals/Self-Care Activities:  Review personal care service information and provider list that was mailed 11/26/2020 Select 2 to 3 agencies you would like to use, keep this until your assessment is completed by [facility] Return calls from [facility] or call them directly if you have questions [phone] or [phone] I have spoken with [employer] they will send pullups      Materials provided: Yes: PCS list of providers  [name] sister was given information about Care Management services today including:  Care Management services include personalized support from designated clinical staff supervised by his physician, including individualized plan of care and coordination with other care providers 24/7 contact phone numbers for assistance for urgent and routine care needs. The patient may stop Care Management services at any time by phone call to the office staff.  Patient's sister agreed to services and verbal consent obtained.   Patient's sister verbalizes understanding of instructions provided today.  Follow up plan: SW will follow up with patient by phone over the next 2 weeks   [name], [place] [facility]  [phone]

## 2020-11-25 NOTE — Chronic Care Management (AMB) (Signed)
Care Management Clinical Social Work Note  11/25/2020 Name: [name] MRN: [id number] DOB: 07-02-1955  [name] is a 65 y.o. year old male who is a primary care patient of Hensel, Jamal Collin, MD.  The Care Management team was consulted for assistance with chronic disease management and coordination needs.  Engaged with patient's sister by telephone for initial visit in response to provider referral for social work chronic care management and care coordination services  Consent to Services:  [name] was given information about Care Management services today including:  Care Management services includes personalized support from designated clinical staff supervised by his physician, including individualized plan of care and coordination with other care providers 24/7 contact phone numbers for assistance for urgent and routine care needs. The patient may stop case management services at any time by phone call to the office staff.  Patient's sister agreed to services and consent obtained.   Assessment: Patient's sister [name] provided all information during this encounter. Patient is experiencing difficulty with ADL's.. See Care Plan below for interventions and patient self-care actives.  Recent life changes /stressors: Patient recently lived in a rooming house and is now living with her.  He experiencing difficulty with personal hygiene.[name] has health concerns and limitations with caring for patient. She will return to work in Aug however she is not interested in facility placement at this time.  Recommendation: Patient may benefit from, and sister is in agreement to move forward with [place].   Follow up Plan: Patient's sister would like continued follow-up from CCM LCSW .  Follow up scheduled in 2 weeks. Patient will call office if needed prior to next encounter.   Review of patient past medical history, allergies, medications, and health  status, including review of relevant consultants reports was performed today as part of a comprehensive evaluation and provision of chronic care management and care coordination services.  SDOH (Social Determinants of Health) assessments and interventions performed:    Advanced Directives Status: See [employer] application for related entries.  Care Plan  No Known Allergies  Outpatient Encounter Medications as of 11/25/2020  Medication Sig   albuterol (VENTOLIN HFA) 108 (90 Base) MCG/ACT inhaler INHALE 2 PUFFS BY MOUTH EVERY 6 HOURS AS NEEDED FOR WHEEZING OR SHORTNESS OF BREATH   atorvastatin (LIPITOR) 80 MG tablet Take 1 tablet (80 mg total) by mouth daily.   clopidogrel (PLAVIX) 75 MG tablet TAKE 1 TABLET(75 MG) BY MOUTH DAILY   omeprazole (PRILOSEC) 40 MG capsule TAKE 1 CAPSULE(40 MG) BY MOUTH DAILY (Patient taking differently: Take 40 mg by mouth daily. )   sertraline (ZOLOFT) 50 MG tablet Take 1 tablet (50 mg total) by mouth at bedtime. For depression   tamsulosin (FLOMAX) 0.4 MG CAPS capsule Take 1 capsule (0.4 mg total) by mouth daily.   No facility-administered encounter medications on file as of 11/25/2020.    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 [facility])    Gait disturbance, post-stroke    Coronary artery disease involving native coronary artery of native heart without  angina pectoris    Benign essential HTN    Prediabetes    History of alcohol abuse    Anemia, iron deficiency 04/04/2017   Peripheral arterial  disease (HCC)    GERD (gastroesophageal reflux disease)    Hepatitis C, chronic ([place]) 06/16/2015   Urinary incontinence 12/26/2012   Weight loss 06/30/2011   Hyperlipidemia 12/28/2010   Dizziness 05/19/2010   Smoker 09/10/2009   Acute bilateral low back pain without sciatica 11/27/2008   COPD, moderate ([place]) 08/13/2006   Insomnia 07/26/2006    Conditions to be addressed/monitored:  Level of care concerns  Care Plan : General Social Work (Adult)  Updates made by [name], LCSW since 11/25/2020 12:00 AM   Problem: Functional Decline    Goal: Mobility and Function inhanced with additional home support   Start Date: 11/25/2020  This Visit's Progress: On track  Current Barriers:   Patient unable to consistently perform activities of daily living and needs assistance and support in order to meet this unmet need Currently unable to independently self manage needs related to chronic health conditions.  Knowledge Deficits related to short term plan for care coordination needs and long term plans for chronic disease management needs Clinical Goals: Over the next 45 to 60 days, patient will have personal care needs met as evident by having PCS Aide in the home assisting with needs.  Clinical Interventions : Assessed needs, level of care concerns, basic eligibility and provided education on Personal Care Service process,  Collaborate with primary care provider ref completing PCS referral ( Referral placed in PCP's mailbox 11/25/2020 ) Mailed patient's sister a list of PCS agencies and what to expect with PCS process Collaborated with [employer] to get patient the correct incontinence supplies ( needs pull-ups) PCS referral will be faxed to [employer] at [phone] once completed and signed by PCP LCSW will collaborate with [facility] to verify application is received and processed.  Active listening / Reflection utilized , Emotional Supportive Provided,  Problem Solving /Task Center , and Caregiver stress acknowledged  Collaboration with PCP regarding development and update of comprehensive plan of care as evidenced by provider attestation and co-signature Inter-disciplinary care team collaboration (see longitudinal plan of care) Patient Goals/Self-Care Activities: Over the next 30 days Review personal care service information and provider list that was mailed 11/26/2020 Select 2 to 3 agencies you would like to use, keep this until your assessment is completed by [facility] Return calls from [facility] or call them directly if you have questions [phone] or [phone] I have spoken with [employer] they will send pullups      [name], [place] / [place]   [phone] 2:42 PM

## 2020-12-01 ENCOUNTER — Ambulatory Visit: Payer: Self-pay | Admitting: Licensed Clinical Social Worker

## 2020-12-01 DIAGNOSIS — Z741 Need for assistance with personal care: Secondary | ICD-10-CM

## 2020-12-01 NOTE — Patient Instructions (Signed)
Visit Information   Goals Addressed             This Visit's Progress    Maintain Mobility and Function   On track     Patient Goals/Self-Care Activities:  Review personal care service information and provider list that was mailed 11/26/2020 Select 2 to 3 agencies you would like to use, keep this until your assessment is completed by [facility] Return calls from [facility] or call them directly if you have questions [phone] or [phone] I have spoken with [employer] they will send pullups      Telephone follow up appointment with care management team member scheduled for: 12/08/2020  [name], [place] [facility] Coord[facility]  [phone]

## 2020-12-01 NOTE — Chronic Care Management (AMB) (Signed)
  Care Management  Collaboration  Note  12/01/2020 Name: Rachel Samples MRN: [id number] DOB: June 15, 1955  Berman Grainger is a 65 y.o. year old male who is a primary care patient of Hensel, Jamal Collin, MD. The CCM team was consulted reference care coordination needs for Level of Care Concerns and Caregiver Stress.  Assessment: Patient was not interviewed or contacted during this encounter. CCM LCSW collaborated with PCP and [facility] . See Care Plan below for interventions and patient self-care actives.  Intervention:Conducted brief assessment, recommendations and relevant information discussed.    Follow up Plan: Patient would like continued follow-up from CCM LCSW .  Follow up scheduled in 7 days. Patient will call office if needed prior to next encounter.   Collaboration with Zenia Resides, MD regarding development and update of comprehensive plan of care as evidenced by provider attestation and co-signature  Review of patient past medical history, allergies, medications, and health status, including review of pertinent consultant reports was performed as part of comprehensive evaluation and provision of care management/care coordination services.   Care Plan Conditions to be addressed/monitored per PCP order:  Level of care concerns  Patient Care Plan: General Social Work (Adult)   Problem Identified: Functional Decline    Goal: Mobility and Function enhanced with additional home support   Start Date: 11/25/2020  This Visit's Progress: On track  Recent Progress: On track  Current Barriers:   Patient unable to consistently perform activities of daily living and needs assistance and support in order to meet this unmet need Currently unable to independently self manage needs related to chronic health conditions.  Knowledge Deficits related to short term plan for care coordination needs and long term plans for chronic disease management needs Clinical Goals:  Over the next 45 to 60 days, patient will have personal care needs met as evident by having PCS Aide in the home assisting with needs.  Clinical Interventions : Assessed needs, level of care concerns, basic eligibility and provided education on Personal Care Service process,  Collaborate with primary care provider ref completing PCS referral  Mailed patient's sister a list of PCS agencies and what to expect with PCS process; [facility]2020 Collaborated with [employer] to get patient the correct incontinence supplies ( needs pull-ups) PCS referral will be faxed to [employer] at [phone] once completed and signed by PCP LCSW will collaborate with [facility] to verify application is received and processed.  Active listening / Reflection utilized , Emotional Supportive Provided, Problem Solving /Task Center , and Caregiver stress acknowledged  Inter-disciplinary care team collaboration (see longitudinal plan of care) Patient Goals/Self-Care Activities: Over the next 30 days Review personal care service information and provider list that was mailed 11/26/2020 Select 2 to 3 agencies you would like to use, keep this until your assessment is completed by [facility] Return calls from [facility] or call them directly if you have questions [phone] or [phone] I have spoken with [employer] they will send pullups       [name], [place] / [place]   [phone] 11:27 AM

## 2020-12-05 DIAGNOSIS — I10 Essential (primary) hypertension: Secondary | ICD-10-CM | POA: Diagnosis not present

## 2020-12-05 DIAGNOSIS — G4700 Insomnia, unspecified: Secondary | ICD-10-CM | POA: Diagnosis not present

## 2020-12-05 DIAGNOSIS — F4323 Adjustment disorder with mixed anxiety and depressed mood: Secondary | ICD-10-CM | POA: Diagnosis not present

## 2020-12-05 DIAGNOSIS — I739 Peripheral vascular disease, unspecified: Secondary | ICD-10-CM | POA: Diagnosis not present

## 2020-12-05 DIAGNOSIS — B182 Chronic viral hepatitis C: Secondary | ICD-10-CM | POA: Diagnosis not present

## 2020-12-05 DIAGNOSIS — I69351 Hemiplegia and hemiparesis following cerebral infarction affecting right dominant side: Secondary | ICD-10-CM | POA: Diagnosis not present

## 2020-12-05 DIAGNOSIS — I2510 Atherosclerotic heart disease of native coronary artery without angina pectoris: Secondary | ICD-10-CM | POA: Diagnosis not present

## 2020-12-05 DIAGNOSIS — J441 Chronic obstructive pulmonary disease with (acute) exacerbation: Secondary | ICD-10-CM | POA: Diagnosis not present

## 2020-12-05 DIAGNOSIS — F17210 Nicotine dependence, cigarettes, uncomplicated: Secondary | ICD-10-CM | POA: Diagnosis not present

## 2020-12-06 ENCOUNTER — Telehealth: Payer: Self-pay

## 2020-12-06 DIAGNOSIS — B182 Chronic viral hepatitis C: Secondary | ICD-10-CM | POA: Diagnosis not present

## 2020-12-06 DIAGNOSIS — J441 Chronic obstructive pulmonary disease with (acute) exacerbation: Secondary | ICD-10-CM | POA: Diagnosis not present

## 2020-12-06 DIAGNOSIS — G4700 Insomnia, unspecified: Secondary | ICD-10-CM | POA: Diagnosis not present

## 2020-12-06 DIAGNOSIS — F17210 Nicotine dependence, cigarettes, uncomplicated: Secondary | ICD-10-CM | POA: Diagnosis not present

## 2020-12-06 DIAGNOSIS — I69351 Hemiplegia and hemiparesis following cerebral infarction affecting right dominant side: Secondary | ICD-10-CM | POA: Diagnosis not present

## 2020-12-06 DIAGNOSIS — F4323 Adjustment disorder with mixed anxiety and depressed mood: Secondary | ICD-10-CM | POA: Diagnosis not present

## 2020-12-06 DIAGNOSIS — I10 Essential (primary) hypertension: Secondary | ICD-10-CM | POA: Diagnosis not present

## 2020-12-06 DIAGNOSIS — I2510 Atherosclerotic heart disease of native coronary artery without angina pectoris: Secondary | ICD-10-CM | POA: Diagnosis not present

## 2020-12-06 DIAGNOSIS — I739 Peripheral vascular disease, unspecified: Secondary | ICD-10-CM | POA: Diagnosis not present

## 2020-12-06 NOTE — Telephone Encounter (Signed)
Kenney Houseman [facility] RN calls nurse line requesting verbal orders for [facility] nursing as follows.  1x a week for 3 weeks  2x a month for 1 month  1 PRN   Verbal orders given per [facility] protocol.   Kenney Houseman also reported he informed her he is not taking Sertraline. Kenney Houseman stated his sister took this medication away from him because it made him crazy. Kenney Houseman also reported he has not had BM since 7/7 and would like something call in. Will forward to PCP.

## 2020-12-06 NOTE — Telephone Encounter (Signed)
[name] [facility] PT calls nurse line requesting verbal orders for [facility] PT as follows.   1x a week for 9 weeks  Social Work Hydrographic surveyor orders given per [facility] protocol.

## 2020-12-06 NOTE — Telephone Encounter (Signed)
Noted and agree. 

## 2020-12-07 ENCOUNTER — Other Ambulatory Visit: Payer: Self-pay

## 2020-12-07 DIAGNOSIS — K219 Gastro-esophageal reflux disease without esophagitis: Secondary | ICD-10-CM

## 2020-12-07 MED ORDER — OMEPRAZOLE 40 MG PO CPDR: DELAYED_RELEASE_CAPSULE | ORAL | 3 refills | Status: AC

## 2020-12-08 ENCOUNTER — Ambulatory Visit: Payer: Medicare HMO | Admitting: Licensed Clinical Social Worker

## 2020-12-08 ENCOUNTER — Ambulatory Visit: Payer: Medicare HMO

## 2020-12-08 ENCOUNTER — Other Ambulatory Visit: Payer: Self-pay

## 2020-12-08 ENCOUNTER — Ambulatory Visit
Admission: RE | Admit: 2020-12-08 | Discharge: 2020-12-08 | Disposition: A | Discharge status: Home / Self Care | Payer: Medicare HMO | Source: Ambulatory Visit | Attending: Family Medicine | Admitting: Family Medicine

## 2020-12-08 DIAGNOSIS — R634 Abnormal weight loss: Secondary | ICD-10-CM

## 2020-12-08 DIAGNOSIS — Z7189 Other specified counseling: Secondary | ICD-10-CM

## 2020-12-08 DIAGNOSIS — Z85110 Personal history of malignant carcinoid tumor of bronchus and lung: Secondary | ICD-10-CM | POA: Diagnosis not present

## 2020-12-08 DIAGNOSIS — I700 Atherosclerosis of aorta: Secondary | ICD-10-CM | POA: Diagnosis not present

## 2020-12-08 DIAGNOSIS — Z719 Counseling, unspecified: Secondary | ICD-10-CM

## 2020-12-08 DIAGNOSIS — R911 Solitary pulmonary nodule: Secondary | ICD-10-CM | POA: Diagnosis not present

## 2020-12-08 DIAGNOSIS — J439 Emphysema, unspecified: Secondary | ICD-10-CM | POA: Diagnosis not present

## 2020-12-08 NOTE — Chronic Care Management (AMB) (Signed)
Care Management   Clinical Social Work Note  12/08/2020 Name: Jerimey Burridge MRN: [id number] DOB: 09-28-1955  Kadan Millstein is a 65 y.o. year old male who is a primary care patient of Hensel, Jamal Collin, MD. The CCM team was consulted to assist the patient with chronic disease management and/or care coordination needs related to: Level of Care Concerns and Caregiver Stress.   Collaboration with patient's sister  for initial visit in response to provider referral for social work chronic care management and care coordination services.   Consent to Services:  The patient was given information about Chronic Care Management services, agreed to services, and gave verbal consent prior to initiation of services.  Please see initial visit note for detailed documentation.   Patient agreed to services and consent obtained.   Assessment: Patient's sister provided all information during this encounter. They are making progress with getting needed support in the home, PT and OT have started, PCS assessment scheduled 7/15722.  Sister/caregiver continues to experience difficulty with managing patient's needs due to her own health concern..Phone disconnected at the end of appointment.  ( Called back 3 times unable to connect with patient's sister)See Care Plan below for interventions and patient self-care actives.  Recent life changes [place]: caregiver recently broke hand  Follow up Plan: No follow up scheduled with CCM team at this time. Will follow up with patient in 2 weeks .   Review of patient past medical history, allergies, medications, and health status, including review of relevant consultants reports was performed today as part of a comprehensive evaluation and provision of chronic care management and care coordination services.     SDOH (Social Determinants of Health) assessments and interventions performed:    Advanced Directives Status: Not addressed in this  encounter.  CCM Care Plan  No Known Allergies  Outpatient Encounter Medications as of 12/08/2020  Medication Sig   albuterol (VENTOLIN HFA) 108 (90 Base) MCG/ACT inhaler INHALE 2 PUFFS BY MOUTH EVERY 6 HOURS AS NEEDED FOR WHEEZING OR SHORTNESS OF BREATH   atorvastatin (LIPITOR) 80 MG tablet Take 1 tablet (80 mg total) by mouth daily.   clopidogrel (PLAVIX) 75 MG tablet TAKE 1 TABLET(75 MG) BY MOUTH DAILY   omeprazole (PRILOSEC) 40 MG capsule TAKE 1 CAPSULE(40 MG) BY MOUTH DAILY   sertraline (ZOLOFT) 50 MG tablet Take 1 tablet (50 mg total) by mouth at bedtime. For depression   tamsulosin (FLOMAX) 0.4 MG CAPS capsule Take 1 capsule (0.4 mg total) by mouth daily.   No facility-administered encounter medications on file as of 12/08/2020.    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 [facility])    Gait disturbance, post-stroke    Coronary artery disease involving native coronary artery of native heart without angina pectoris    Benign essential HTN    Prediabetes    History of alcohol abuse    Anemia, iron deficiency 04/04/2017   Peripheral arterial disease (HCC)    GERD (gastroesophageal reflux disease)    Hepatitis C, chronic ([place]) 06/16/2015   Urinary incontinence 12/26/2012  Weight loss 06/30/2011   Hyperlipidemia 12/28/2010   Dizziness 05/19/2010   Smoker 09/10/2009   Acute bilateral low back pain without sciatica 11/27/2008   COPD, moderate ([place]) 08/13/2006   Insomnia 07/26/2006    Conditions to be  addressed/monitored:  Level of care concerns  Care Plan : General Social Work (Adult)  Updates made by [name], LCSW since 12/08/2020 12:00 AM   Problem: Functional Decline    Goal: Mobility and Function enhanced with additional home support   Start Date: 11/25/2020  This Visit's Progress: On track  Recent Progress: On track  Current Barriers:   Patient unable to consistently perform activities of daily living and needs assistance and support in order to meet this unmet need Caregiver experiencing stress Currently unable to independently self manage needs related to chronic health conditions.  Knowledge Deficits related to short term plan for care coordination needs and long term plans for chronic disease management needs Clinical Goals: Over the next 45 to 60 days, patient will have personal care needs met as evident by having PCS Aide in the home assisting with needs.  Clinical Interventions : Assessed needs, level of care concerns, basic eligibility and provided education on Personal Care Service process,  Collaborated with [employer] to get patient the correct incontinence supplies : per sister they still have not received the [place] with sister on the line  PCS assessment scheduled July 15th at 10:30 Active listening / Reflection utilized , Emotional Supportive Provided, Problem Solving /Task Center , and Caregiver stress acknowledged  Inter-disciplinary care team collaboration (see longitudinal plan of care) Patient Goals/Self-Care Activities: Over the next 30 days Review personal care service information and provider list  Keep intake assessment with Janeece Riggers for [facility] July 15th  Select 2 to 3 agencies you would like to use, keep this until your assessment is completed by Janeece Riggers Return calls from [facility] or call them directly if you have questions [phone] or [phone] I have spoken with [employer] they will send pullups        [name], [place] / [place]   [phone] 4:26 PM

## 2020-12-08 NOTE — Patient Instructions (Signed)
Visit Information   Goals Addressed             This Visit's Progress    Maintain Mobility and Function        Patient Goals/Self-Care Activities:  Review personal care service information and provider list  Keep intake assessment with [place] for [facility] July 15th  Select 2 to 3 agencies you would like to use, keep this until your assessment is completed by [facility] Return calls from [facility] or call them directly if you have questions [phone] or [phone] I have spoken with [employer] they will send pullups      Patient verbalizes understanding of instructions provided today. Follow up appointment in 2 weeks  [name], [place]

## 2020-12-09 ENCOUNTER — Ambulatory Visit (INDEPENDENT_AMBULATORY_CARE_PROVIDER_SITE_OTHER): Payer: Medicare HMO | Admitting: Family Medicine

## 2020-12-09 ENCOUNTER — Encounter: Payer: Self-pay | Admitting: Family Medicine

## 2020-12-09 DIAGNOSIS — I2510 Atherosclerotic heart disease of native coronary artery without angina pectoris: Secondary | ICD-10-CM | POA: Diagnosis not present

## 2020-12-09 DIAGNOSIS — F4323 Adjustment disorder with mixed anxiety and depressed mood: Secondary | ICD-10-CM | POA: Diagnosis not present

## 2020-12-09 DIAGNOSIS — J449 Chronic obstructive pulmonary disease, unspecified: Secondary | ICD-10-CM

## 2020-12-09 DIAGNOSIS — R634 Abnormal weight loss: Secondary | ICD-10-CM

## 2020-12-09 DIAGNOSIS — Z515 Encounter for palliative care: Secondary | ICD-10-CM

## 2020-12-09 DIAGNOSIS — R1310 Dysphagia, unspecified: Secondary | ICD-10-CM

## 2020-12-09 DIAGNOSIS — I69351 Hemiplegia and hemiparesis following cerebral infarction affecting right dominant side: Secondary | ICD-10-CM

## 2020-12-09 DIAGNOSIS — J441 Chronic obstructive pulmonary disease with (acute) exacerbation: Secondary | ICD-10-CM | POA: Diagnosis not present

## 2020-12-09 DIAGNOSIS — R32 Unspecified urinary incontinence: Secondary | ICD-10-CM | POA: Diagnosis not present

## 2020-12-09 DIAGNOSIS — B182 Chronic viral hepatitis C: Secondary | ICD-10-CM | POA: Diagnosis not present

## 2020-12-09 DIAGNOSIS — F17200 Nicotine dependence, unspecified, uncomplicated: Secondary | ICD-10-CM | POA: Diagnosis not present

## 2020-12-09 DIAGNOSIS — K219 Gastro-esophageal reflux disease without esophagitis: Secondary | ICD-10-CM | POA: Diagnosis not present

## 2020-12-09 DIAGNOSIS — F17210 Nicotine dependence, cigarettes, uncomplicated: Secondary | ICD-10-CM | POA: Diagnosis not present

## 2020-12-09 DIAGNOSIS — G4700 Insomnia, unspecified: Secondary | ICD-10-CM | POA: Diagnosis not present

## 2020-12-09 DIAGNOSIS — I739 Peripheral vascular disease, unspecified: Secondary | ICD-10-CM | POA: Diagnosis not present

## 2020-12-09 DIAGNOSIS — I10 Essential (primary) hypertension: Secondary | ICD-10-CM | POA: Diagnosis not present

## 2020-12-09 NOTE — Assessment & Plan Note (Signed)
Large differential.  If he wanted routine, aggressive medical care, I would get speech, MBS, Likely ENT consult and maybe GI consult.  He does not want any of this.

## 2020-12-09 NOTE — Assessment & Plan Note (Signed)
Incontinence supplies with more frequent pull up changes will improve his lifestyle.

## 2020-12-09 NOTE — Assessment & Plan Note (Signed)
Not interested in quiting.  Smoking provides comfort to him.

## 2020-12-09 NOTE — Assessment & Plan Note (Signed)
Stabilized but still marginal PO intake

## 2020-12-09 NOTE — Assessment & Plan Note (Signed)
Control fair at best.  Continues to smoke.  Recent CT chest results pending.

## 2020-12-09 NOTE — Patient Instructions (Signed)
I put in a referral to hospice. Make sure you keep the Pink Most form and show all nurses and providers Donnie: Two things to make your sister's life easier. Change your pullups regularly. Pick up after yourself.  I gave you a prescription for the reach extenders to help. Call me in a week or two to let me know how things are going.

## 2020-12-09 NOTE — Progress Notes (Signed)
    SUBJECTIVE:   CHIEF COMPLAINT / HPI:   Multiple issues. Urinary incontinence.  Needs supplies.  He has tried to stretch out usage only using one per day.  Overflow is common causing soiling of bed and clothes.   Dysphagia.  Coughs and chokes easily.  S/P CVA.  Has had swallowing dysfunction since.  Also continues to smoke so at risk for head and neck cancer.  Long differential with neoplasm, worsening COPD and dysmotility all on the possibility list. Involuntary weight loss.  Wt has stabilized at a low for him weight.  Still struggles to get adaquate PO intake.   Improved social situation.  Out of halfway house and now living with his sister.  Sister is at home now but will need to go back to work in fall. Frequent falls, Impaired mobility.  PT has been ordered.  Reportedly they have made only one visit. COPD and at risk for lung Ca.  Just had chest CT yesterday.  Films not yet read. Goals of care.  [name] is tired of the fight and just wants to be kept comfortable.  His sister is more frank "He has given up."  DNR and DNI.  Wants to continue to smoke.  Does not want to go back to the hospital.  He does not even want antibiotics.  "I am ready to die"     OBJECTIVE:   BP 110/62   Pulse 79   Ht 5\' 7"  (1.702 m)   Wt 138 lb 6.4 oz (62.8 kg)   SpO2 98%   BMI 21.68 kg/m    Wt and lowish BP noted.   Wet speech and coughed multiple times in room Lungs diffuse rhonchi and prolonged exp phase.   ASSESSMENT/PLAN:   Palliative care encounter Continues to smoke.  No interest in quitting. Most form completed.  See overview Refer to hospice.  His weight loss, COPD and dysphagia make me think he has less than six months to live - especially if he does not desire hospitalization or antibiotic treatments.    Urinary incontinence Incontinence supplies with more frequent pull up changes will improve his lifestyle.  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  Definitely dreases his quality of life.  Smoker Not interested in quiting.  Smoking provides comfort to him.  COPD, moderate ([place]) Control fair at best.  Continues to smoke.  Recent CT chest results pending.    Dysphagia Large differential.  If he wanted routine, aggressive medical care, I would get speech, MBS, Likely ENT consult and maybe GI consult.  He does not want any of this.    Douglas Resides, MD [place]

## 2020-12-09 NOTE — Assessment & Plan Note (Signed)
Increases his fall risk, worsens his incontienence, likely worsens his dysphagia.  Definitely dreases his quality of life.

## 2020-12-09 NOTE — Assessment & Plan Note (Addendum)
Continues to smoke.  No interest in quitting. Most form completed.  See overview Refer to hospice.  His weight loss, COPD and dysphagia make me think he has less than six months to live - especially if he does not desire hospitalization or antibiotic treatments.

## 2020-12-10 ENCOUNTER — Telehealth: Payer: Self-pay

## 2020-12-10 DIAGNOSIS — R32 Unspecified urinary incontinence: Secondary | ICD-10-CM | POA: Diagnosis not present

## 2020-12-10 DIAGNOSIS — I69351 Hemiplegia and hemiparesis following cerebral infarction affecting right dominant side: Secondary | ICD-10-CM

## 2020-12-10 NOTE — Telephone Encounter (Signed)
[place] OT calls nurse line requesting verbal orders for [facility] OT as follows.   1x a week for 5 weeks  VO given per [facility] protocol.   Please place a DME order for tub bench.

## 2020-12-10 NOTE — Telephone Encounter (Signed)
Tub bench order entered as requested.

## 2020-12-13 DIAGNOSIS — I2510 Atherosclerotic heart disease of native coronary artery without angina pectoris: Secondary | ICD-10-CM | POA: Diagnosis not present

## 2020-12-13 DIAGNOSIS — G4700 Insomnia, unspecified: Secondary | ICD-10-CM | POA: Diagnosis not present

## 2020-12-13 DIAGNOSIS — J441 Chronic obstructive pulmonary disease with (acute) exacerbation: Secondary | ICD-10-CM | POA: Diagnosis not present

## 2020-12-13 DIAGNOSIS — I69351 Hemiplegia and hemiparesis following cerebral infarction affecting right dominant side: Secondary | ICD-10-CM | POA: Diagnosis not present

## 2020-12-13 DIAGNOSIS — I10 Essential (primary) hypertension: Secondary | ICD-10-CM | POA: Diagnosis not present

## 2020-12-13 DIAGNOSIS — I739 Peripheral vascular disease, unspecified: Secondary | ICD-10-CM | POA: Diagnosis not present

## 2020-12-13 DIAGNOSIS — F4323 Adjustment disorder with mixed anxiety and depressed mood: Secondary | ICD-10-CM | POA: Diagnosis not present

## 2020-12-13 DIAGNOSIS — B182 Chronic viral hepatitis C: Secondary | ICD-10-CM | POA: Diagnosis not present

## 2020-12-13 DIAGNOSIS — F17210 Nicotine dependence, cigarettes, uncomplicated: Secondary | ICD-10-CM | POA: Diagnosis not present

## 2020-12-14 ENCOUNTER — Encounter: Payer: Self-pay | Admitting: Family Medicine

## 2020-12-14 ENCOUNTER — Telehealth: Payer: Self-pay

## 2020-12-14 DIAGNOSIS — F4323 Adjustment disorder with mixed anxiety and depressed mood: Secondary | ICD-10-CM | POA: Diagnosis not present

## 2020-12-14 DIAGNOSIS — I739 Peripheral vascular disease, unspecified: Secondary | ICD-10-CM | POA: Diagnosis not present

## 2020-12-14 DIAGNOSIS — I2510 Atherosclerotic heart disease of native coronary artery without angina pectoris: Secondary | ICD-10-CM | POA: Diagnosis not present

## 2020-12-14 DIAGNOSIS — B182 Chronic viral hepatitis C: Secondary | ICD-10-CM | POA: Diagnosis not present

## 2020-12-14 DIAGNOSIS — I69351 Hemiplegia and hemiparesis following cerebral infarction affecting right dominant side: Secondary | ICD-10-CM | POA: Diagnosis not present

## 2020-12-14 DIAGNOSIS — J441 Chronic obstructive pulmonary disease with (acute) exacerbation: Secondary | ICD-10-CM | POA: Diagnosis not present

## 2020-12-14 DIAGNOSIS — F17210 Nicotine dependence, cigarettes, uncomplicated: Secondary | ICD-10-CM | POA: Diagnosis not present

## 2020-12-14 DIAGNOSIS — G4700 Insomnia, unspecified: Secondary | ICD-10-CM | POA: Diagnosis not present

## 2020-12-14 DIAGNOSIS — I10 Essential (primary) hypertension: Secondary | ICD-10-CM | POA: Diagnosis not present

## 2020-12-14 DIAGNOSIS — J449 Chronic obstructive pulmonary disease, unspecified: Secondary | ICD-10-CM

## 2020-12-14 NOTE — Telephone Encounter (Signed)
Pt's sister came in requesting a call back from Dr. Andria Frames to inform them of pt's results from recent testing he had done.

## 2020-12-15 MED ORDER — SPIRIVA HANDIHALER 18 MCG IN CAPS: 18.0000 ug | ORAL_CAPSULE | Freq: Every day | RESPIRATORY_TRACT | 12 refills | Status: AC

## 2020-12-15 NOTE — Assessment & Plan Note (Signed)
When I called with CT results, I informed him of COPD.  States he is having SOB.  Only using albuterol.  Added spiriva.

## 2020-12-15 NOTE — Telephone Encounter (Signed)
I attempted to call yesterday when the results came in.  No answer and no voicemail - just like my attempt this morning.   I have created a letter and mailed it.  I will make another attempt or two to call.

## 2021-01-04 ENCOUNTER — Telehealth: Payer: Self-pay

## 2021-01-04 NOTE — Telephone Encounter (Signed)
Patient's sister presents to front office requesting to speak with a nurse. Sister reports that she is needing assistance with having patient placed in assisted living. Patient is requiring more care than sister can provide and is not allowing home health services to enter home.   Sister also reports that patient has been threatening to kill himself. Sister reports that all weapons are locked up and that patient does not have access.   Advised sister to take patient to [facility] as soon as possible or to call 911. Provided sister with card with location and phone number.   Please let me know if there is anything else you would like me to do regarding this matter.   Talbot Grumbling, RN

## 2021-01-05 ENCOUNTER — Telehealth: Payer: Self-pay | Admitting: Licensed Clinical Social Worker

## 2021-01-05 NOTE — Chronic Care Management (AMB) (Signed)
    Clinical Social Work  Care Management   Phone Outreach    01/05/2021 Name: [name] MRN: [id number] DOB: [birth year]  [name] is a 65 y.o. year old male who is a primary care patient of Hensel, Jamal Collin, MD .   F/U phone call today to assess needs, progress and barriers with care plan goals.   Patient 's sister came to office yesterday asking for help to care for patient.   Telephone outreach was unsuccessful. Called all phone numbers in the chart. Unable to leave a HIPPA compliant phone message due to voice mail not set up.  Plan:LCSW has a phone appointment scheduled with sister tomorrow at 2:00 will attempt to reach her at that time.  May also attempt another outreach today.  Review of patient status, including review of consultants reports, relevant laboratory and other test results, and collaboration with appropriate care team members and the patient's provider was performed as part of comprehensive patient evaluation and provision of care management services.    [name], [place] / [place]   [phone] 8:39 AM

## 2021-01-05 NOTE — Chronic Care Management (AMB) (Signed)
    Clinical Social Work  Care Management   Phone Outreach    01/05/2021 Name: [name] MRN: [id number] DOB: Nov 25, 1955  [name] is a 65 y.o. year old male who is a primary care patient of Hensel, Jamal Collin, MD .   Reason for referral: Level of Care Concerns and Caregiver Stress.    2nd outreach attempt today. F/U phone call today to assess needs, progress and barriers with care plan goals.   Telephone outreach was unsuccessful Unable to leave a HIPPA compliant phone message due to voice mail not set up.  Plan:A phone appointment is rescheduled with CCM LCSW tomorrow.  Will attempt outreach again tomorrow.  Review of patient status, including review of consultants reports, relevant laboratory and other test results, and collaboration with appropriate care team members and the patient's provider was performed as part of comprehensive patient evaluation and provision of care management services.     [name], [place] / [place]   [phone] 3:30 PM

## 2021-01-05 NOTE — Telephone Encounter (Signed)
Please let hospice know.  They are following this patient and should be able to support as Neoma Laming and others help sister look for placement.

## 2021-01-06 ENCOUNTER — Ambulatory Visit: Payer: Medicare HMO | Admitting: Licensed Clinical Social Worker

## 2021-01-06 DIAGNOSIS — Z7189 Other specified counseling: Secondary | ICD-10-CM

## 2021-01-06 DIAGNOSIS — Z719 Counseling, unspecified: Secondary | ICD-10-CM

## 2021-01-06 DIAGNOSIS — Z741 Need for assistance with personal care: Secondary | ICD-10-CM

## 2021-01-06 NOTE — Chronic Care Management (AMB) (Signed)
Care Management Clinical Social Work Note  01/06/2021 Name: [name] MRN: [id number] DOB: 11-25-1955  [name] is a 65 y.o. year old male who is a primary care patient of Hensel, Jamal Collin, MD.  The Care Management team was consulted for assistance with chronic disease management and coordination needs.  Consent to Services:  The patient was given information about Care Management services, agreed to services, and gave verbal consent prior to initiation of services.  Please see initial visit note for detailed documentation.   Patient agreed to services today and consent obtained.   Assessment: Engaged with patient's sister [name] by phone in response to provider referral for social work care coordination services: Level of Care Concerns and Caregiver Stress.   She continues to experience difficulty with caring for patient.  Patient has declined any assistance from a male aide and will not allow them to bath him.  Sister has not called to locate a [facility] provider as she is over whelmed. Reports so many people helping it's hard to keep up. Expressed importance of selecting an agency of Janeece Riggers will close the referral.  They have limited support and [name] is stressed.  She is willing to keep patient at home and does not want facility placement at this time  See Care Plan below for interventions and patient self-care actives.  Recommendation: Patient may benefit from, and sister is in agreement call agencies on the list and provide information to [facility] care.   Follow up Plan: No follow up scheduled with CCM team at this time. Will follow up with patient in 5 to7 days .Patient's sister will call office if needed prior to next encounter.   Review of patient past medical history, allergies, medications, and health status, including review of relevant consultants reports was performed today as part of a comprehensive evaluation and provision of chronic care  management and care coordination services.  SDOH (Social Determinants of Health) assessments and interventions performed:    Advanced Directives Status: See Care Plan for related entries.  Care Plan  No Known Allergies  Outpatient Encounter Medications as of 01/06/2021  Medication Sig   albuterol (VENTOLIN HFA) 108 (90 Base) MCG/ACT inhaler INHALE 2 PUFFS BY MOUTH EVERY 6 HOURS AS NEEDED FOR WHEEZING OR SHORTNESS OF BREATH   atorvastatin (LIPITOR) 80 MG tablet Take 1 tablet (80 mg total) by mouth daily.   clopidogrel (PLAVIX) 75 MG tablet TAKE 1 TABLET(75 MG) BY MOUTH DAILY   omeprazole (PRILOSEC) 40 MG capsule TAKE 1 CAPSULE(40 MG) BY MOUTH DAILY   sertraline (ZOLOFT) 50 MG tablet Take 1 tablet (50 mg total) by mouth at bedtime. For depression   tamsulosin (FLOMAX) 0.4 MG CAPS capsule Take 1 capsule (0.4 mg total) by mouth daily.   tiotropium (SPIRIVA HANDIHALER) 18 MCG inhalation capsule Place 1 capsule (18 mcg total) into inhaler and inhale daily.   No facility-administered encounter medications on file as of 01/06/2021.    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 ([place])    Gait disturbance, post-stroke  Coronary artery disease involving native coronary artery of native heart without angina pectoris    Benign essential HTN    Prediabetes    History of alcohol abuse    Anemia, iron deficiency 04/04/2017   Peripheral arterial disease (HCC)    GERD (gastroesophageal reflux disease)    Hepatitis  C, chronic ([place]) 06/16/2015   Urinary incontinence 12/26/2012   Weight loss 06/30/2011   Hyperlipidemia 12/28/2010   Dizziness 05/19/2010   Smoker 09/10/2009   Acute bilateral low back pain without sciatica 11/27/2008   COPD, moderate ([place]) 08/13/2006   Insomnia 07/26/2006    Conditions to be addressed/monitored:  Level of care concerns and care giver stress  Care Plan : General Social Work (Adult)  Updates made by [name], LCSW since 01/06/2021 12:00 AM     Problem: Functional Decline      Goal: Mobility and Function enhanced with additional home support   Start Date: 11/25/2020  This Visit's Progress: Not on track  Recent Progress: On track  Note:   Current Barriers:   Patient unable to consistently perform activities of daily living and needs assistance and support in order to meet this unmet need Patient only wants a male aide will not allow a male to bath or care for him. Caregiver experiencing stress; she has broken wrist and limited to caring for patient; will be returning to work the end of Aug. Currently unable to independently self manage needs related to chronic health conditions.  Clinical Goals: Over the next 45 to 60 days, patient will have personal care needs met as evident by having PCS Aide in the home assisting with needs.  Clinical Interventions : Assessed needs, progress, steps completed by caregiver, support and barriers to care [place] to get name & phone number of patient's care team, sister unable to remember ( RN Verne Spurr [phone] & Social worker [place] [phone] Left message for Hospice Social Worker  [place] to call LCSW for collaboration Sister can't remember if PCS assessment was completed;  LCSW called [facility] for update: assessment completed, patient and family have until Aug. 26th to select an agency of referral will be closed. Active listening / Reflection utilized , Emotional Supportive Provided, Problem  Solving /Task Center , and Caregiver stress acknowledged  Inter-disciplinary care team collaboration (see longitudinal plan of care) Patient Goals/Self-Care Activities: Over the next 10 days Review personal care service information and provider list  Select 2 to 3 agencies you would like to use, call and give information to [facility] Return calls from [facility] or call them directly if you have questions [phone] or [place], [place] / [place]   [phone] 2:48 PM

## 2021-01-06 NOTE — Patient Instructions (Signed)
Visit Information   Goals Addressed             This Visit's Progress    Maintain Mobility and Function        Patient Goals/Self-Care Activities:  Review personal care service information and provider list  Select 2 to 3 agencies you would like to use, call and give information to [facility] Return calls from [facility] or call them directly if you have questions [phone] or [phone]       Patient verbalizes understanding of instructions provided today.   Will follow up with you in one week.  Please call the office prior to our next encounter if needs arise   [name], [place] Management & Coordination  [phone]

## 2021-01-07 ENCOUNTER — Encounter: Payer: Self-pay | Admitting: Gastroenterology

## 2021-01-11 ENCOUNTER — Ambulatory Visit: Payer: Self-pay | Admitting: Licensed Clinical Social Worker

## 2021-01-11 DIAGNOSIS — Z7189 Other specified counseling: Secondary | ICD-10-CM

## 2021-01-11 NOTE — Chronic Care Management (AMB) (Signed)
  Care Management  Collaboration  Note  01/11/2021 Name: [name] MRN: [id number] DOB: December 24, 1955  [name] is a 65 y.o. year old male who is a primary care patient of Hensel, Douglas Collin, MD. The CCM team was consulted reference care coordination needs for Level of Care Concerns.  Assessment: Attempted to contact Patient's sister but did not get an answer. See Care Plan or interventions for patient self-care actives.   Intervention:Conducted brief assessment, recommendations and relevant information discussed. CCM [name] collaborated with [employer] .    Follow up Plan: [name] will reach out to patient's sister again in the next 24 to 48 hours.  If unable to reach sister will inform [place] worker to continue outreach.    Review of patient past medical history, allergies, medications, and health status, including review of pertinent consultant reports was performed as part of comprehensive evaluation and provision of care management/care coordination services.   Care Plan Conditions to be addressed/monitored per PCP order: , Level of care concerns and caregiver stress    Care Plan : General Social Work (Adult)  Updates made by Douglas Cane, [name] since 01/11/2021 12:00 AM     Problem: Functional Decline      Goal: Mobility and Function enhanced with additional home support   Start Date: 11/25/2020  This Visit's Progress: Not on track  Recent Progress: Not on track  Note:   Current Barriers:   Patient unable to consistently perform activities of daily living and needs assistance and support in order to meet this unmet need Patient only wants a male aide will not allow a male to bath or care for him. Caregiver experiencing stress; she has broken wrist and limited to caring for patient; will be returning to work the end of Aug. Currently unable to independently self manage needs related to chronic health conditions.  Clinical  Goals: Over the next 45 to 60 days, patient will have personal care needs met as evident by having PCS Aide in the home assisting with needs.  Clinical Interventions : Assessed needs, progress, steps completed by caregiver, support and barriers to care [place] to get name & phone number of patient's care team, sister unable to remember ( RN [name] [phone] & Social worker [name] [phone] Collaboration with [name] , [phone]  [name] .[name] ) [facility] care social worker; she will take the lead working with patient and sister; [name] will assist as needed.  Goal is to get patient set up with PCS before the deadline Aug. 26th [name] called [facility] for update: assessment completed, patient and family have until Aug. 26th to select an agency of referral will be closed. Active listening / Reflection utilized , Emotional Supportive Provided, Problem Solving /Task Center , and Caregiver stress acknowledged  Inter-disciplinary care team collaboration (see longitudinal plan of care) Patient Goals/Self-Care Activities: Over the next 10 days Review personal care service information and provider list  Select 2 to 3 agencies you would like to use, call and give information to [facility] Return calls from [facility] or call them directly if you have questions [phone] or [place], [place] / [place]   [phone] 2:18 PM

## 2021-01-11 NOTE — Patient Instructions (Signed)
   I was unable to reach you today.    Will follow up with you 24 to 48 hours to discuss barriers with personal care services.  Please call the office prior to our next encounter if needs arise   [name], [place] Management & Coordination  [phone]

## 2021-01-13 ENCOUNTER — Ambulatory Visit: Payer: Medicare HMO | Admitting: Licensed Clinical Social Worker

## 2021-01-13 DIAGNOSIS — Z719 Counseling, unspecified: Secondary | ICD-10-CM

## 2021-01-13 DIAGNOSIS — Z636 Dependent relative needing care at home: Secondary | ICD-10-CM

## 2021-01-13 DIAGNOSIS — Z741 Need for assistance with personal care: Secondary | ICD-10-CM

## 2021-01-13 NOTE — Chronic Care Management (AMB) (Signed)
Care Management Clinical Social Work Note  01/13/2021 Name: [name] MRN: [id number] DOB: May 21, 1956  [name] is a 65 y.o. year old male who is a primary care patient of Hensel, Jamal Collin, MD.  The Care Management team was consulted for assistance with chronic disease management and coordination needs.  Consent to Services:  The patient was given information about Care Management services, agreed to services, and gave verbal consent prior to initiation of services.  Please see initial visit note for detailed documentation.   Patient agreed to services today and consent obtained.   Assessment: Engaged with patient's sister by phone in response to provider referral for social work care coordination services: Level of Care Concerns and Caregiver Stress.    Sister [name] continues to experience difficulty with connecting with [name]. Explained the LCSW is unable to call to provide the information she has to call. Also informed [name] the [place] Worker would be taking the lead and LCSW would provider backup support to her.. See Care Plan below for interventions and patient self-care actives.  Recent life changes or stressors: decline in patient's health  Recommendation: Patient may benefit from, and sister [name] is in agreement call [name] today to give them names of the agencies we reviewed today.   Follow up Plan: Secure e-mail hand off sent to [facility] social worker. No follow up scheduled with CCM team at this time. .Patient will call office if needed prior to next encounter. CCM LCSW will continue to collaborate with [name] ( she will serve as primary) to assist with meeting patient's needs .   Review of patient past medical history, allergies, medications, and health status, including review of relevant consultants reports was performed today as part of a comprehensive evaluation and provision of  chronic care management and care coordination services.  SDOH (Social Determinants of Health) assessments and interventions performed:    Advanced Directives Status: Not addressed in this encounter.  Care Plan  No Known Allergies  Outpatient Encounter Medications as of 01/13/2021  Medication Sig   albuterol (VENTOLIN HFA) 108 (90 Base) MCG/ACT inhaler INHALE 2 PUFFS BY MOUTH EVERY 6 HOURS AS NEEDED FOR WHEEZING OR SHORTNESS OF BREATH   atorvastatin (LIPITOR) 80 MG tablet Take 1 tablet (80 mg total) by mouth daily.   clopidogrel (PLAVIX) 75 MG tablet TAKE 1 TABLET(75 MG) BY MOUTH DAILY   omeprazole (PRILOSEC) 40 MG capsule TAKE 1 CAPSULE(40 MG) BY MOUTH DAILY   sertraline (ZOLOFT) 50 MG tablet Take 1 tablet (50 mg total) by mouth at bedtime. For depression   tamsulosin (FLOMAX) 0.4 MG CAPS capsule Take 1 capsule (0.4 mg total) by mouth daily.   tiotropium (SPIRIVA HANDIHALER) 18 MCG inhalation capsule Place 1 capsule (18 mcg total) into inhaler and inhale daily.   No facility-administered encounter medications on file as of 01/13/2021.    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 [facility])    Gait disturbance, post-stroke    Coronary artery disease involving  native coronary artery of native heart without angina pectoris    Benign essential HTN    Prediabetes    History of alcohol abuse    Anemia, iron deficiency 04/04/2017   Peripheral arterial disease (HCC)    GERD (gastroesophageal reflux disease)     Hepatitis C, chronic ([place]) 06/16/2015   Urinary incontinence 12/26/2012   Weight loss 06/30/2011   Hyperlipidemia 12/28/2010   Dizziness 05/19/2010   Smoker 09/10/2009   Acute bilateral low back pain without sciatica 11/27/2008   COPD, moderate ([place]) 08/13/2006   Insomnia 07/26/2006    Conditions to be addressed/monitored: ; Level of care concerns  Care Plan : General Social Work (Adult)  Updates made by [name], LCSW since 01/13/2021 12:00 AM     Problem: Functional Decline      Goal: Mobility and Function enhanced with additional home support   Start Date: 11/25/2020  This Visit's Progress: On track  Recent Progress: Not on track  Note:   Current Barriers:   Patient unable to consistently perform activities of daily living and needs assistance and support in order to meet this unmet need Patient only wants a male aide will not allow a male to bath or care for him. Caregiver experiencing stress; she has broken wrist and limited to caring for patient; will be returning to work the end of Aug. Currently unable to independently self manage needs related to chronic health conditions.  Clinical Goals: Over the next 45 to 60 days, patient will have personal care needs met as evident by having PCS Aide in the home assisting with needs.  Clinical Interventions : Assessed needs, progress, steps completed by caregiver, support and barriers to care [place] to get name & phone number of patient's care team, sister unable to remember ( RN Verne Spurr [phone] & Social worker Madari [phone] Collaboration with Grier Rocher , [phone]  [email or web address] ) [facility] care social worker; she will take the lead working with patient and sister; LCSW will assist as needed.  Goal is to get patient set up with PCS before the deadline Aug. 26th Motivational interviewing to get caregiver to provide needed information to [place] called [facility] for update: assessment completed, patient and family have until Aug. 26th to select an agency of referral will be closed. Active listening / Reflection utilized , Emotional Supportive Provided, Problem Solving /Task Center , and Caregiver stress acknowledged  Inter-disciplinary care team collaboration (see longitudinal plan of care) Patient Goals/Self-Care Activities: Over the next 10 days Select 2 to 3 agencies you would like to use, call and give information to [facility] Return calls from [facility] or call them directly if you have questions [phone] or [place] care social worker will contact you next week      [name], [place] / [place]   [phone] 2:36 PM

## 2021-01-13 NOTE — Patient Instructions (Signed)
Visit Information   Goals Addressed             This Visit's Progress    Additional support in the home        Patient Goals/Self-Care Activities:  Select 2 to 3 agencies you would like to use, call and give information to [facility] Return calls from [facility] or call them directly if you have questions [phone] or [phone] Your [place] care social worker will contact you next week       It was a pleasure speaking with you today. Please call the office if needed No follow up scheduled, per our conversation, [facility] care social worker will take the lead for your care Patient's sister verbalizes understanding of instructions provided today.   [name], LCSW Care Management & Coordination  [phone]

## 2021-01-18 ENCOUNTER — Ambulatory Visit: Payer: Self-pay | Admitting: Licensed Clinical Social Worker

## 2021-01-18 DIAGNOSIS — Z789 Other specified health status: Secondary | ICD-10-CM

## 2021-01-18 NOTE — Patient Instructions (Signed)
     Patient was not contacted during this encounter.  LCSW collaborated with care team to accomplish patient's care plan goal   [name], [place]

## 2021-01-18 NOTE — Chronic Care Management (AMB) (Signed)
Care Management  Collaboration  Note  01/18/2021 Name: Vergil Burby MRN: [id number] DOB: 05/26/1956  Lynden Flemmer is a 65 y.o. year old male who is a primary care patient of Hensel, Jamal Collin, MD. The CCM team was consulted reference care coordination needs for Level of Care Concerns and Caregiver Stress.  Assessment: Patient was not interviewed or contacted during this encounter. Family has decided to move forward with facility placement. [place] will take the lead with this plan. See Care Plan or interventions for patient self-care actives.   Intervention:Conducted brief assessment, recommendations and relevant information discussed. CCM LCSW collaborated with [facility] .    Follow up Plan:  No follow up scheduled with CCM team at this time. .Patient or sister will call office if needed prior to next encounter. CCM LCSW will continue to collaborate with [place] Worker as needed . Patient may benefit from and is in agreement for CCM LCSW to remain part of care team for the next 90 days.  If no needs are identified in the next 90 days, CCM LCSW will disconnect from the care team.   Review of patient past medical history, allergies, medications, and health status, including review of pertinent consultant reports was performed as part of comprehensive evaluation and provision of care management/care coordination services.   Care Plan Conditions to be addressed/monitored per PCP order: , Level of care concerns   Care Plan : General Social Work (Adult)  Updates made by [name], LCSW since 01/18/2021 12:00 AM     Problem: Functional Decline      Goal: Mobility and Function enhanced with additional home support   Start Date: 11/25/2020  Recent Progress: On track  Note:   Current Barriers:   Patient unable to consistently perform activities of daily living and needs assistance and support in order to meet this unmet  need Caregiver experiencing stress; she has broken wrist and limited to caring for patient; will be returning to work the end of Aug. Sister has not called [facility] to select a provider has decided she would like placement Currently unable to independently self manage needs related to chronic health conditions.  Clinical Goals: Over the next 45 to 60 days, patient will have personal care needs met as evident by having PCS Aide in the home assisting with needs.  Clinical Interventions : Assessed needs, progress, steps completed by caregiver, support and barriers to care [place] to get name & phone number of patient's care team, sister unable to remember ( RN Verne Spurr [phone] & Social worker Madari [phone] Collaboration with Grier Rocher , [phone]  [email or web address] ) [facility] care social worker; she will take the lead working with patient and sister; LCSW will assist as needed.  Goal is to get patient set up with PCS before the deadline Aug. 26th Motivational interviewing to get caregiver to provide needed information to [place] called [facility] for update: assessment completed, patient and family have until Aug. 26th to select an agency of referral will be closed. Active listening / Reflection utilized , Emotional Supportive Provided, Problem Solving /Task Center , and Caregiver stress acknowledged  Inter-disciplinary care team collaboration (see longitudinal plan of care) Patient Goals/Self-Care Activities: Over the next 10 days Select 2 to 3 agencies you would like to use, call and give information to [facility] Return calls from [facility] or call them directly if you have questions [phone] or  [phone] Your [place] care social worker will contact you next week    [name], [place] / [place]   [phone] 4:08  PM

## 2021-01-27 ENCOUNTER — Ambulatory Visit: Payer: Self-pay | Admitting: Licensed Clinical Social Worker

## 2021-01-27 DIAGNOSIS — Z7689 Persons encountering health services in other specified circumstances: Secondary | ICD-10-CM

## 2021-01-27 NOTE — Patient Instructions (Signed)
   Patient was not contacted during this encounter.  LCSW collaborated with care team to accomplish patient's care plan goal   [name], [place]

## 2021-01-27 NOTE — Chronic Care Management (AMB) (Signed)
  Care Management  Collaboration  Note  01/27/2021 Name: [name] MRN: [id number] DOB: 11-12-1955  [name] is a 65 y.o. year old male who is a primary care patient of Hensel, Jamal Collin, MD. The CCM team was consulted reference care coordination needs for Level of Care Concerns.  Assessment: Patient was not interviewed or contacted during this encounter. See Care Plan or interventions for patient self-care actives.   Intervention:Conducted brief assessment, recommendations and relevant information discussed. CCM LCSW collaborated with Designer, fashion/clothing .    Follow up Plan: CCM LCSW will continue to collaborate with Hospice Social Worker in order to meet patient's needs . Patient may benefit from and is in agreement for CCM LCSW to remain part of care team for the next 90 days.  If no needs are identified in the next 90 days, CCM LCSW will disconnect from the care team.    Review of patient past medical history, allergies, medications, and health status, including review of pertinent consultant reports was performed as part of comprehensive evaluation and provision of care management/care coordination services.   Care Plan Conditions to be addressed/monitored per PCP order: , Level of care concerns   Care Plan : General Social Work (Adult)  Updates made by [name], LCSW since 01/27/2021 12:00 AM     Problem: Functional Decline      Goal: Facility placement   Start Date: 11/25/2020  This Visit's Progress: On track  Recent Progress: On track  Note:   Current Barriers:   Patient unable to consistently perform activities of daily living and needs assistance and support in order to meet this unmet need Caregiver experiencing stress; she has broken wrist and limited to caring for patient; will be returning to work the end of Aug. Sister has not called [facility] to select a provider has decided she would like placement Currently unable to  independently self manage needs related to chronic health conditions.  Clinical Goals: Over the next 45 to 60 days, patient will have personal care needs met as evident by having PCS Aide in the home assisting with needs.  Clinical Interventions : Assessed needs, progress, steps completed by caregiver, support and barriers to care Continue to Collaboration with Grier Rocher , [phone]  [email or web address] .[email or web address] ) [facility] care social worker;  Provided a list of facilities to [facility] to assist with placement   Motivational interviewing to get caregiver to provide needed information to [place] listening / Reflection utilized , Emotional Supportive Provided, Problem Solving [place] , and Caregiver stress acknowledged  Inter-disciplinary care team collaboration (see longitudinal plan of care) Patient Goals/Self-Care Activities: Over the next 30 days Your [facility] care social worker will assist with placement    [name], [place] / [place]   [phone] 9:53 AM

## 2021-02-01 DIAGNOSIS — R32 Unspecified urinary incontinence: Secondary | ICD-10-CM | POA: Diagnosis not present

## 2021-02-15 ENCOUNTER — Ambulatory Visit: Payer: Self-pay | Admitting: Licensed Clinical Social Worker

## 2021-02-15 NOTE — Patient Instructions (Signed)
   Patient was not contacted during this encounter.  LCSW collaborated with care team to accomplish patient's care plan goal   [name], [place]

## 2021-02-15 NOTE — Chronic Care Management (AMB) (Signed)
  Care Management  Collaboration  Note  02/15/2021 Name: [name] MRN: [id number] DOB: 22-Jun-1955  [name] is a 65 y.o. year old male who is a primary care patient of Hensel, Jamal Collin, MD. The CCM team was consulted reference care coordination needs for Level of Care Concerns.  Assessment: Patient was not interviewed or contacted during this encounter. Hospice Social worker located a facility placement for patient however he and sister both declined the placement and wanted a new request for PCS to allow patient to remain home.  See Care Plan or interventions for patient self-care actives.   Intervention:Conducted brief assessment, recommendations and relevant information discussed. CCM LCSW collaborated with PCP and Designer, fashion/clothing. Also discussed possible CAPS referral.    New PCS referral given to PCP to sign.    Follow up Plan: CCM LCSW will continue to collaborate with Hospice Social Worker in order to meet patient's needs .   Review of patient past medical history, allergies, medications, and health status, including review of pertinent consultant reports was performed as part of comprehensive evaluation and provision of care management/care coordination services.   Care Plan Conditions to be addressed/monitored per PCP order: , Level of care concerns   Care Plan : General Social Work (Adult)  Updates made by [name], LCSW since 02/15/2021 12:00 AM     Problem: Functional Decline      Goal: ADL's met with PCS   Start Date: 11/25/2020  This Visit's Progress: On track  Recent Progress: On track  Note:   Current Barriers:   Patient and sister declined placement at [employer] [employer] Patient unable to consistently perform activities of daily living and needs assistance and support in order to meet this unmet need Caregiver experiencing stress; she has broken wrist and limited to caring for patient; will be returning to work the end of  Aug. Currently unable to independently self manage needs related to chronic health conditions.  Clinical Goals: Over the next 45 to 60 days, patient will have personal care needs met as evident by having PCS Aide in the home assisting with needs.  Clinical Interventions : Assessed needs, progress, steps completed by caregiver, support and barriers to care Collaboration with Grier Rocher , [phone]  [email or web address] ) [facility] care social worker;  ( placement options ect Patient and sister have decided they would like to move forward with PCS again PCS form completed and sent to PCP to review and sign Inter-disciplinary care team collaboration (see longitudinal plan of care) Patient Goals/Self-Care Activities: Over the next 30 days Will continue to work with Your [facility] care social worker to assist with ongoing needs    [name], [place] / [place]   [phone] 2:08 PM

## 2021-03-01 DIAGNOSIS — R32 Unspecified urinary incontinence: Secondary | ICD-10-CM | POA: Diagnosis not present

## 2021-03-14 ENCOUNTER — Ambulatory Visit: Payer: Self-pay | Admitting: Licensed Clinical Social Worker

## 2021-03-14 DIAGNOSIS — Z7689 Persons encountering health services in other specified circumstances: Secondary | ICD-10-CM

## 2021-03-14 NOTE — Chronic Care Management (AMB) (Signed)
  Care Management  Collaboration  Note  03/14/2021 Name: Kyro Joswick MRN: [id number] DOB: May 31, 1955  Moustapha Tooker is a 65 y.o. year old male who is a primary care patient of Hensel, Jamal Collin, MD. The CCM team was consulted reference care coordination needs for Level of Care Concerns.  Assessment: Patient was not interviewed or contacted during this encounter. Patient continues to receive all primary support from [facility]. Patient is having difficulty connecting for [facility]. [place] social worker will assist with process. See Care Plan or interventions for patient self-care actives.   Intervention:Conducted brief assessment, recommendations and relevant information discussed. CCM LCSW collaborated with [employer] .    Follow up Plan:  CCM LCSW will continue to collaborate with [facility] in order to meet patient's needs . No follow up scheduled  Will disconnect from care team in next 30 to 60 days    Review of patient past medical history, allergies, medications, and health status, including review of pertinent consultant reports was performed as part of comprehensive evaluation and provision of care management/care coordination services.   Care Plan Conditions to be addressed/monitored per PCP order: , Level of care concerns   Care Plan : General Social Work (Adult)  Updates made by [name], LCSW since 03/14/2021 12:00 AM     Problem: Functional Decline      Goal: ADL's met with [facility]   Start Date: 11/25/2020  This Visit's Progress: Not on track  Recent Progress: On track  Note:   Current Barriers:   [place] care unable to connect with patient and sister Patient and sister declined placement at [facility] Patient unable to consistently perform activities of daily living and needs assistance and support in order to meet this unmet need Caregiver experiencing stress; she has broken wrist and limited to caring  for patient; will be returning to work the end of Aug. Currently unable to independently self manage needs related to chronic health conditions.  Clinical Goals: Over the next 45 to 60 days, patient will have personal care needs met as evident by having [facility] Aide in the home assisting with needs.  Clinical Interventions : Assessed needs, progress, steps completed by caregiver, support and barriers to care Collaboration with Grier Rocher , [phone]  [email or web address] ) [facility] care social worker;  ( placement options ect Patient and sister have decided they would like to move forward with [facility] again Ongoing Collaboration with [place] called [place] [facility]; unable to connect with patient and sister LCSW Collaborated with [place] worker to assist family with making connection  Inter-disciplinary care team collaboration (see longitudinal plan of care) Patient Goals/Self-Care Activities: Over the next 30 days Will continue to work with Your [facility] care social worker to assist with ongoing needs    [name], [place] / [place]   [phone]

## 2021-03-14 NOTE — Patient Instructions (Signed)
    Patient was not contacted during this encounter.  LCSW collaborated with care team to accomplish patient's care plan goal   [name], [place] [facility] Coord[facility]  [phone]

## 2021-03-24 ENCOUNTER — Ambulatory Visit: Payer: Self-pay | Admitting: Licensed Clinical Social Worker

## 2021-03-24 DIAGNOSIS — Z7689 Persons encountering health services in other specified circumstances: Secondary | ICD-10-CM

## 2021-03-24 NOTE — Chronic Care Management (AMB) (Signed)
  Care Management  Collaboration  Note  03/24/2021 Name: Antonio Creswell MRN: [id number] DOB: [birth year]  Kayven Aldaco is a 65 y.o. year old male who is a primary care patient of Hensel, Jamal Collin, MD. The CCM team was consulted reference care coordination needs for Level of Care Concerns. PCS   Assessment: Patient was not interviewed or contacted during this encounter. [place] still unable to connect with patient and sister.See Care Plan or interventions for patient self-care actives.   Intervention:Conducted brief assessment, recommendations and relevant information discussed. CCM LCSW collaborated with [facility] and [facility] Social Worker .    Follow up Plan: No follow up scheduled with CCM LCSW. at this time.Patient will call office if needed. CCM LCSW will continue to collaborate with [place] Worker as needed in order to meet patient's needs .   Review of patient past medical history, allergies, medications, and health status, including review of pertinent consultant reports was performed as part of comprehensive evaluation and provision of care management/care coordination services.   Care Plan Conditions to be addressed/monitored per PCP order: , Level of care concerns   Care Plan : General Social Work (Adult)  Updates made by [name], LCSW since 03/24/2021 12:00 AM     Problem: Functional Decline      Goal: ADL's met with [facility]   Start Date: 11/25/2020  This Visit's Progress: On track  Recent Progress: Not on track  Note:   Current Barriers:   [place] care unable to connect with patient and sister Patient and sister declined placement at [facility] Patient unable to consistently perform activities of daily living and needs assistance and support in order to meet this unmet need Caregiver experiencing stress; she has broken wrist and limited to caring for patient; will be returning to work the end of  Aug. Currently unable to independently self manage needs related to chronic health conditions.  Clinical Goals: Over the next 45 to 60 days, patient will have personal care needs met as evident by having [facility] Aide in the home assisting with needs.  Clinical Interventions : Assessed needs, progress, steps completed by caregiver, support and barriers to care Collaboration with Grier Rocher , [phone]  [email or web address] .Radonna Ricker ) [facility] care social worker;  ( placement options ect Patient and sister have decided they would like to move forward with [facility] again Ongoing Collaboration with [place] received call from Regino Ramirez [phone] with [facility] health care; unable to connect with patient and sister LCSW Collaborated with [place] worker to assist family with making connection  Inter-disciplinary care team collaboration (see longitudinal plan of care) Patient Goals/Self-Care Activities: Over the next 30 days Will continue to work with Your [facility] care social worker to assist with ongoing needs    [name], [place] / [place]   [phone]

## 2021-03-24 NOTE — Patient Instructions (Signed)
   Patient was not contacted during this encounter.  LCSW collaborated with care team to accomplish patient's care plan goal   [name], [place] [facility]  [phone]

## 2021-04-07 ENCOUNTER — Ambulatory Visit: Payer: Self-pay | Admitting: Licensed Clinical Social Worker

## 2021-04-07 NOTE — Patient Instructions (Signed)
   Patient was not contacted during this encounter.  LCSW collaborated with care team to accomplish patient's care plan goal   [name], [place] [facility] Coord[facility]  [phone]

## 2021-04-07 NOTE — Chronic Care Management (AMB) (Signed)
  Care Management  Collaboration  Note  04/07/2021 Name: [name] MRN: [id number] DOB: Jan 09, 1956  [name] is a 65 y.o. year old male who is a primary care patient of Hensel, Douglas Collin, MD. The CCM team was consulted reference care coordination needs for Level of Care Concerns.  Assessment: Patient was not interviewed or contacted during this encounter. Patient has connected with [facility] for [facility]. Assessment completed Tuesday and sister has selected an agency.  See Care Plan or interventions for patient self-care actives.   Intervention:Conducted brief assessment, recommendations and relevant information discussed. CCM LCSW collaborated with  [place] care social worker to assist with ongoing needs  Follow up Plan:  All care plan goals have been met. Will disconnect from care team  [place] care social worker will continue to provide ongoing needs for patient   Review of patient past medical history, allergies, medications, and health status, including review of pertinent consultant reports was performed as part of comprehensive evaluation and provision of care management/care coordination services.   Care Plan Conditions to be addressed/monitored per PCP order: , Level of care concerns   Care Plan : General Social Work (Adult)  Updates made by Douglas Cane, LCSW since 04/07/2021 12:00 AM     Problem: Functional Decline      Goal: ADL's met with [facility] Completed 04/07/2021  Start Date: 11/25/2020  This Visit's Progress: On track  Recent Progress: On track  Note:   Current Barriers:   Patient unable to consistently perform activities of daily living and needs assistance and support in order to meet this unmet need Caregiver experiencing stress; she has broken wrist and limited to caring for patient; will be returning to work the end of Aug. Currently unable to independently self manage needs related to chronic health conditions.  Clinical  Goals: Over the next 45 to 60 days, patient will have personal care needs met as evident by having PCS Aide in the home assisting with needs.  Clinical Interventions : Assessed needs, progress, steps completed by caregiver, support and barriers to care Collaboration with [name] , Gutierrez  [name] .[name] ) [facility] care social worker;  ( placement options ect Inter-disciplinary care team collaboration (see longitudinal plan of care) Patient Goals/Self-Care Activities:  Patient and sister will continue to work with [name] care social worker to assist with ongoing needs    [name], [place] / [place]   [phone]

## 2021-06-29 DEATH — deceased
